# Patient Record
Sex: Female | Born: 1945 | Race: White | Hispanic: No | Smoking: Former smoker
Health system: Southern US, Community
[De-identification: ages and names within clinical notes are randomized; demographics above are authoritative.]

## PROBLEM LIST (undated history)

## (undated) DIAGNOSIS — Z8679 Personal history of other diseases of the circulatory system: Secondary | ICD-10-CM

## (undated) DIAGNOSIS — Z923 Personal history of irradiation: Secondary | ICD-10-CM

## (undated) DIAGNOSIS — C50919 Malignant neoplasm of unspecified site of unspecified female breast: Secondary | ICD-10-CM

## (undated) DIAGNOSIS — O2230 Deep phlebothrombosis in pregnancy, unspecified trimester: Secondary | ICD-10-CM

## (undated) DIAGNOSIS — K219 Gastro-esophageal reflux disease without esophagitis: Secondary | ICD-10-CM

## (undated) DIAGNOSIS — E785 Hyperlipidemia, unspecified: Secondary | ICD-10-CM

## (undated) DIAGNOSIS — E049 Nontoxic goiter, unspecified: Secondary | ICD-10-CM

## (undated) DIAGNOSIS — F209 Schizophrenia, unspecified: Secondary | ICD-10-CM

## (undated) DIAGNOSIS — N83209 Unspecified ovarian cyst, unspecified side: Secondary | ICD-10-CM

## (undated) DIAGNOSIS — I10 Essential (primary) hypertension: Secondary | ICD-10-CM

## (undated) DIAGNOSIS — D219 Benign neoplasm of connective and other soft tissue, unspecified: Secondary | ICD-10-CM

## (undated) DIAGNOSIS — D229 Melanocytic nevi, unspecified: Secondary | ICD-10-CM

## (undated) DIAGNOSIS — M1990 Unspecified osteoarthritis, unspecified site: Secondary | ICD-10-CM

## (undated) DIAGNOSIS — G5601 Carpal tunnel syndrome, right upper limb: Secondary | ICD-10-CM

## (undated) DIAGNOSIS — G629 Polyneuropathy, unspecified: Secondary | ICD-10-CM

## (undated) DIAGNOSIS — F419 Anxiety disorder, unspecified: Secondary | ICD-10-CM

## (undated) DIAGNOSIS — Z8489 Family history of other specified conditions: Secondary | ICD-10-CM

## (undated) DIAGNOSIS — J939 Pneumothorax, unspecified: Secondary | ICD-10-CM

## (undated) DIAGNOSIS — J189 Pneumonia, unspecified organism: Secondary | ICD-10-CM

## (undated) DIAGNOSIS — D649 Anemia, unspecified: Secondary | ICD-10-CM

## (undated) HISTORY — DX: Malignant neoplasm of unspecified site of unspecified female breast: C50.919

## (undated) HISTORY — PX: TONSILLECTOMY: SUR1361

## (undated) HISTORY — DX: Carpal tunnel syndrome, right upper limb: G56.01

## (undated) HISTORY — PX: APPENDECTOMY: SHX54

## (undated) HISTORY — PX: MENISCUS REPAIR: SHX5179

## (undated) HISTORY — DX: Unspecified osteoarthritis, unspecified site: M19.90

## (undated) HISTORY — DX: Deep phlebothrombosis in pregnancy, unspecified trimester: O22.30

## (undated) HISTORY — PX: TUBAL LIGATION: SHX77

## (undated) HISTORY — DX: Gastro-esophageal reflux disease without esophagitis: K21.9

## (undated) HISTORY — DX: Personal history of other diseases of the circulatory system: Z86.79

## (undated) HISTORY — DX: Unspecified ovarian cyst, unspecified side: N83.209

## (undated) HISTORY — DX: Nontoxic goiter, unspecified: E04.9

## (undated) HISTORY — PX: VAGINA SURGERY: SHX829

## (undated) HISTORY — DX: Schizophrenia, unspecified: F20.9

## (undated) HISTORY — PX: OTHER SURGICAL HISTORY: SHX169

## (undated) HISTORY — DX: Benign neoplasm of connective and other soft tissue, unspecified: D21.9

## (undated) HISTORY — DX: Melanocytic nevi, unspecified: D22.9

## (undated) HISTORY — DX: Essential (primary) hypertension: I10

## (undated) HISTORY — DX: Hyperlipidemia, unspecified: E78.5

---

## 1980-09-07 DIAGNOSIS — O2230 Deep phlebothrombosis in pregnancy, unspecified trimester: Secondary | ICD-10-CM

## 1980-09-07 HISTORY — DX: Deep phlebothrombosis in pregnancy, unspecified trimester: O22.30

## 1990-09-07 HISTORY — PX: ABDOMINAL HYSTERECTOMY: SHX81

## 2003-01-03 ENCOUNTER — Encounter: Admission: RE | Admit: 2003-01-03 | Discharge: 2003-01-03 | Payer: Self-pay | Admitting: Internal Medicine

## 2003-01-03 ENCOUNTER — Encounter: Payer: Self-pay | Admitting: Internal Medicine

## 2003-01-03 ENCOUNTER — Ambulatory Visit (HOSPITAL_COMMUNITY): Admission: RE | Admit: 2003-01-03 | Discharge: 2003-01-03 | Payer: Self-pay | Admitting: Internal Medicine

## 2003-01-11 ENCOUNTER — Ambulatory Visit (HOSPITAL_COMMUNITY): Admission: RE | Admit: 2003-01-11 | Discharge: 2003-01-11 | Payer: Self-pay | Admitting: Internal Medicine

## 2003-02-13 ENCOUNTER — Encounter: Payer: Self-pay | Admitting: Sports Medicine

## 2003-02-13 ENCOUNTER — Encounter: Admission: RE | Admit: 2003-02-13 | Discharge: 2003-02-13 | Payer: Self-pay | Admitting: Family Medicine

## 2003-10-25 ENCOUNTER — Encounter: Admission: RE | Admit: 2003-10-25 | Discharge: 2003-10-25 | Payer: Self-pay | Admitting: Internal Medicine

## 2003-12-27 ENCOUNTER — Ambulatory Visit (HOSPITAL_COMMUNITY): Admission: RE | Admit: 2003-12-27 | Discharge: 2003-12-27 | Payer: Self-pay | Admitting: Internal Medicine

## 2004-09-07 HISTORY — PX: EYE SURGERY: SHX253

## 2004-10-01 ENCOUNTER — Ambulatory Visit: Payer: Self-pay | Admitting: Podiatry

## 2005-09-07 HISTORY — PX: BREAST BIOPSY: SHX20

## 2005-09-07 HISTORY — PX: BREAST LUMPECTOMY: SHX2

## 2006-02-17 ENCOUNTER — Ambulatory Visit: Payer: Self-pay | Admitting: Internal Medicine

## 2006-06-17 ENCOUNTER — Encounter: Admission: RE | Admit: 2006-06-17 | Discharge: 2006-06-17 | Payer: Self-pay | Admitting: Sports Medicine

## 2006-06-24 ENCOUNTER — Encounter (INDEPENDENT_AMBULATORY_CARE_PROVIDER_SITE_OTHER): Payer: Self-pay | Admitting: *Deleted

## 2006-06-24 ENCOUNTER — Encounter (INDEPENDENT_AMBULATORY_CARE_PROVIDER_SITE_OTHER): Payer: Self-pay | Admitting: Pulmonary Disease

## 2006-06-24 ENCOUNTER — Ambulatory Visit: Payer: Self-pay | Admitting: Internal Medicine

## 2006-06-24 LAB — CONVERTED CEMR LAB: Candida species: NEGATIVE

## 2006-06-25 ENCOUNTER — Encounter (INDEPENDENT_AMBULATORY_CARE_PROVIDER_SITE_OTHER): Payer: Self-pay | Admitting: Pulmonary Disease

## 2006-06-25 LAB — CONVERTED CEMR LAB: Pap Smear: NORMAL

## 2006-06-25 LAB — HM PAP SMEAR

## 2006-06-28 ENCOUNTER — Encounter (INDEPENDENT_AMBULATORY_CARE_PROVIDER_SITE_OTHER): Payer: Self-pay | Admitting: Pulmonary Disease

## 2006-06-28 DIAGNOSIS — F209 Schizophrenia, unspecified: Secondary | ICD-10-CM | POA: Insufficient documentation

## 2006-06-28 DIAGNOSIS — E119 Type 2 diabetes mellitus without complications: Secondary | ICD-10-CM | POA: Insufficient documentation

## 2006-06-28 DIAGNOSIS — K219 Gastro-esophageal reflux disease without esophagitis: Secondary | ICD-10-CM | POA: Insufficient documentation

## 2006-06-28 DIAGNOSIS — E785 Hyperlipidemia, unspecified: Secondary | ICD-10-CM

## 2006-06-28 DIAGNOSIS — K029 Dental caries, unspecified: Secondary | ICD-10-CM | POA: Insufficient documentation

## 2006-06-28 DIAGNOSIS — I10 Essential (primary) hypertension: Secondary | ICD-10-CM

## 2006-06-28 DIAGNOSIS — E1169 Type 2 diabetes mellitus with other specified complication: Secondary | ICD-10-CM | POA: Insufficient documentation

## 2006-06-28 DIAGNOSIS — E1165 Type 2 diabetes mellitus with hyperglycemia: Secondary | ICD-10-CM

## 2006-07-14 ENCOUNTER — Encounter (INDEPENDENT_AMBULATORY_CARE_PROVIDER_SITE_OTHER): Payer: Self-pay | Admitting: Diagnostic Radiology

## 2006-07-14 ENCOUNTER — Encounter (INDEPENDENT_AMBULATORY_CARE_PROVIDER_SITE_OTHER): Payer: Self-pay | Admitting: Specialist

## 2006-07-14 ENCOUNTER — Encounter: Admission: RE | Admit: 2006-07-14 | Discharge: 2006-07-14 | Payer: Self-pay | Admitting: Sports Medicine

## 2006-07-14 DIAGNOSIS — Z853 Personal history of malignant neoplasm of breast: Secondary | ICD-10-CM | POA: Insufficient documentation

## 2006-07-14 DIAGNOSIS — C50919 Malignant neoplasm of unspecified site of unspecified female breast: Secondary | ICD-10-CM

## 2006-07-14 HISTORY — DX: Malignant neoplasm of unspecified site of unspecified female breast: C50.919

## 2006-07-14 HISTORY — DX: Personal history of malignant neoplasm of breast: Z85.3

## 2006-08-03 ENCOUNTER — Encounter: Admission: RE | Admit: 2006-08-03 | Discharge: 2006-08-03 | Payer: Self-pay | Admitting: General Surgery

## 2006-08-09 ENCOUNTER — Encounter: Admission: RE | Admit: 2006-08-09 | Discharge: 2006-08-09 | Payer: Self-pay | Admitting: General Surgery

## 2006-08-12 ENCOUNTER — Ambulatory Visit (HOSPITAL_BASED_OUTPATIENT_CLINIC_OR_DEPARTMENT_OTHER): Admission: RE | Admit: 2006-08-12 | Discharge: 2006-08-12 | Payer: Self-pay | Admitting: General Surgery

## 2006-08-12 ENCOUNTER — Encounter (INDEPENDENT_AMBULATORY_CARE_PROVIDER_SITE_OTHER): Payer: Self-pay | Admitting: *Deleted

## 2006-08-12 ENCOUNTER — Encounter: Admission: RE | Admit: 2006-08-12 | Discharge: 2006-08-12 | Payer: Self-pay | Admitting: General Surgery

## 2006-08-30 ENCOUNTER — Ambulatory Visit: Payer: Self-pay | Admitting: Oncology

## 2006-09-15 LAB — CBC WITH DIFFERENTIAL/PLATELET
BASO%: 0.8 % (ref 0.0–2.0)
Eosinophils Absolute: 0.1 10*3/uL (ref 0.0–0.5)
LYMPH%: 36.5 % (ref 14.0–48.0)
MONO#: 0.2 10*3/uL (ref 0.1–0.9)
NEUT#: 2 10*3/uL (ref 1.5–6.5)
Platelets: 370 10*3/uL (ref 145–400)
RBC: 4.75 10*6/uL (ref 3.70–5.32)
RDW: 12.6 % (ref 11.3–14.5)
WBC: 3.7 10*3/uL — ABNORMAL LOW (ref 3.9–10.0)
lymph#: 1.3 10*3/uL (ref 0.9–3.3)

## 2006-09-15 LAB — COMPREHENSIVE METABOLIC PANEL
ALT: 13 U/L (ref 0–35)
Albumin: 4.5 g/dL (ref 3.5–5.2)
CO2: 24 mEq/L (ref 19–32)
Calcium: 9.4 mg/dL (ref 8.4–10.5)
Chloride: 103 mEq/L (ref 96–112)
Glucose, Bld: 198 mg/dL — ABNORMAL HIGH (ref 70–99)
Potassium: 3.8 mEq/L (ref 3.5–5.3)
Sodium: 140 mEq/L (ref 135–145)
Total Protein: 7.6 g/dL (ref 6.0–8.3)

## 2006-09-15 LAB — LACTATE DEHYDROGENASE: LDH: 148 U/L (ref 94–250)

## 2006-09-15 LAB — CANCER ANTIGEN 27.29: CA 27.29: 13 U/mL (ref 0–39)

## 2006-09-21 ENCOUNTER — Ambulatory Visit: Admission: RE | Admit: 2006-09-21 | Discharge: 2006-12-08 | Payer: Self-pay | Admitting: Radiation Oncology

## 2006-09-24 ENCOUNTER — Encounter: Admission: RE | Admit: 2006-09-24 | Discharge: 2006-09-24 | Payer: Self-pay | Admitting: Oncology

## 2006-09-28 ENCOUNTER — Ambulatory Visit (HOSPITAL_COMMUNITY): Admission: RE | Admit: 2006-09-28 | Discharge: 2006-09-28 | Payer: Self-pay | Admitting: Oncology

## 2006-09-28 DIAGNOSIS — Z8679 Personal history of other diseases of the circulatory system: Secondary | ICD-10-CM | POA: Insufficient documentation

## 2006-09-28 DIAGNOSIS — Z8672 Personal history of thrombophlebitis: Secondary | ICD-10-CM

## 2006-10-01 ENCOUNTER — Encounter (HOSPITAL_COMMUNITY): Admission: RE | Admit: 2006-10-01 | Discharge: 2006-12-30 | Payer: Self-pay | Admitting: Oncology

## 2006-11-29 ENCOUNTER — Ambulatory Visit: Payer: Self-pay | Admitting: Oncology

## 2006-11-30 ENCOUNTER — Ambulatory Visit (HOSPITAL_COMMUNITY): Admission: RE | Admit: 2006-11-30 | Discharge: 2006-11-30 | Payer: Self-pay | Admitting: Oncology

## 2006-12-01 LAB — CBC WITH DIFFERENTIAL/PLATELET
BASO%: 1.9 % (ref 0.0–2.0)
Basophils Absolute: 0.1 10*3/uL (ref 0.0–0.1)
EOS%: 2.1 % (ref 0.0–7.0)
Eosinophils Absolute: 0.1 10*3/uL (ref 0.0–0.5)
HCT: 39.6 % (ref 34.8–46.6)
HGB: 13.3 g/dL (ref 11.6–15.9)
LYMPH%: 28.5 % (ref 14.0–48.0)
MCH: 28.8 pg (ref 26.0–34.0)
MCHC: 33.7 g/dL (ref 32.0–36.0)
MCV: 85.7 fL (ref 81.0–101.0)
MONO#: 0.5 10*3/uL (ref 0.1–0.9)
MONO%: 12.3 % (ref 0.0–13.0)
NEUT#: 2.2 10*3/uL (ref 1.5–6.5)
NEUT%: 55.3 % (ref 39.6–76.8)
Platelets: 305 10*3/uL (ref 145–400)
RBC: 4.62 10*6/uL (ref 3.70–5.32)
RDW: 11 % — ABNORMAL LOW (ref 11.3–14.5)
WBC: 4 10*3/uL (ref 3.9–10.0)
lymph#: 1.1 10*3/uL (ref 0.9–3.3)

## 2006-12-01 LAB — COMPREHENSIVE METABOLIC PANEL
ALT: 43 U/L — ABNORMAL HIGH (ref 0–35)
AST: 27 U/L (ref 0–37)
Albumin: 4.3 g/dL (ref 3.5–5.2)
BUN: 9 mg/dL (ref 6–23)
CO2: 25 mEq/L (ref 19–32)
Calcium: 9.3 mg/dL (ref 8.4–10.5)
Chloride: 105 mEq/L (ref 96–112)
Potassium: 3.7 mEq/L (ref 3.5–5.3)

## 2006-12-16 ENCOUNTER — Ambulatory Visit: Payer: Self-pay | Admitting: Internal Medicine

## 2006-12-16 ENCOUNTER — Encounter (INDEPENDENT_AMBULATORY_CARE_PROVIDER_SITE_OTHER): Payer: Self-pay | Admitting: Pulmonary Disease

## 2006-12-16 DIAGNOSIS — N83209 Unspecified ovarian cyst, unspecified side: Secondary | ICD-10-CM

## 2006-12-16 DIAGNOSIS — D259 Leiomyoma of uterus, unspecified: Secondary | ICD-10-CM

## 2006-12-16 LAB — CONVERTED CEMR LAB
BUN: 10 mg/dL (ref 6–23)
Blood Glucose, Fingerstick: 183
Cholesterol: 164 mg/dL (ref 0–200)
Creatinine, Ser: 0.68 mg/dL (ref 0.40–1.20)
Creatinine, Urine: 203 mg/dL
HDL: 33 mg/dL — ABNORMAL LOW (ref >39)
Hemoglobin: 13.6 g/dL (ref 12.0–15.0)
Hgb A1c MFr Bld: 7.2 %
MCHC: 31.6 g/dL (ref 30.0–36.0)
Microalb Creat Ratio: 12.3 mg/g (ref 0.0–30.0)
Platelets: 337 10*3/uL (ref 150–400)
Potassium: 4 meq/L (ref 3.5–5.3)
RDW: 12.8 % (ref 11.5–14.0)
Triglycerides: 173 mg/dL — ABNORMAL HIGH (ref <150)
VLDL: 35 mg/dL (ref 0–40)

## 2007-01-24 ENCOUNTER — Telehealth (INDEPENDENT_AMBULATORY_CARE_PROVIDER_SITE_OTHER): Payer: Self-pay | Admitting: Pulmonary Disease

## 2007-01-26 ENCOUNTER — Ambulatory Visit: Payer: Self-pay | Admitting: Oncology

## 2007-01-28 LAB — COMPREHENSIVE METABOLIC PANEL
AST: 15 U/L (ref 0–37)
Albumin: 4.5 g/dL (ref 3.5–5.2)
Alkaline Phosphatase: 120 U/L — ABNORMAL HIGH (ref 39–117)
Potassium: 4.2 mEq/L (ref 3.5–5.3)
Sodium: 136 mEq/L (ref 135–145)
Total Protein: 8.3 g/dL (ref 6.0–8.3)

## 2007-01-28 LAB — CBC WITH DIFFERENTIAL/PLATELET
BASO%: 0.3 % (ref 0.0–2.0)
EOS%: 2 % (ref 0.0–7.0)
MCH: 28.8 pg (ref 26.0–34.0)
MCV: 83.8 fL (ref 81.0–101.0)
MONO%: 9.9 % (ref 0.0–13.0)
NEUT#: 1.5 10*3/uL (ref 1.5–6.5)
RBC: 4.87 10*6/uL (ref 3.70–5.32)
RDW: 12.6 % (ref 11.3–14.5)

## 2007-02-04 ENCOUNTER — Ambulatory Visit: Payer: Self-pay | Admitting: Internal Medicine

## 2007-02-04 ENCOUNTER — Encounter (INDEPENDENT_AMBULATORY_CARE_PROVIDER_SITE_OTHER): Payer: Self-pay | Admitting: Pulmonary Disease

## 2007-02-07 ENCOUNTER — Telehealth: Payer: Self-pay | Admitting: *Deleted

## 2007-02-18 ENCOUNTER — Encounter: Payer: Self-pay | Admitting: Internal Medicine

## 2007-02-18 ENCOUNTER — Ambulatory Visit: Payer: Self-pay | Admitting: Internal Medicine

## 2007-02-18 ENCOUNTER — Inpatient Hospital Stay (HOSPITAL_COMMUNITY): Admission: AD | Admit: 2007-02-18 | Discharge: 2007-02-19 | Payer: Self-pay | Admitting: Internal Medicine

## 2007-02-18 ENCOUNTER — Encounter: Admission: RE | Admit: 2007-02-18 | Discharge: 2007-02-18 | Payer: Self-pay | Admitting: Internal Medicine

## 2007-02-18 ENCOUNTER — Encounter (INDEPENDENT_AMBULATORY_CARE_PROVIDER_SITE_OTHER): Payer: Self-pay | Admitting: Internal Medicine

## 2007-02-18 ENCOUNTER — Encounter (INDEPENDENT_AMBULATORY_CARE_PROVIDER_SITE_OTHER): Payer: Self-pay | Admitting: Pulmonary Disease

## 2007-02-18 LAB — CONVERTED CEMR LAB: Blood Glucose, Fingerstick: 329

## 2007-02-19 ENCOUNTER — Encounter (INDEPENDENT_AMBULATORY_CARE_PROVIDER_SITE_OTHER): Payer: Self-pay | Admitting: Internal Medicine

## 2007-04-05 ENCOUNTER — Encounter (INDEPENDENT_AMBULATORY_CARE_PROVIDER_SITE_OTHER): Payer: Self-pay | Admitting: Internal Medicine

## 2007-04-05 ENCOUNTER — Ambulatory Visit: Payer: Self-pay

## 2007-04-05 ENCOUNTER — Encounter: Payer: Self-pay | Admitting: Internal Medicine

## 2007-04-05 LAB — CONVERTED CEMR LAB
ALT: 36 units/L — ABNORMAL HIGH (ref 0–35)
AST: 22 units/L (ref 0–37)
Albumin: 3.7 g/dL (ref 3.5–5.2)
Alkaline Phosphatase: 96 units/L (ref 39–117)
Total Bilirubin: 0.9 mg/dL (ref 0.3–1.2)
Total CHOL/HDL Ratio: 6.4

## 2007-05-12 ENCOUNTER — Ambulatory Visit: Payer: Self-pay | Admitting: Internal Medicine

## 2007-05-18 ENCOUNTER — Ambulatory Visit: Payer: Self-pay | Admitting: Oncology

## 2007-05-20 LAB — COMPREHENSIVE METABOLIC PANEL
ALT: 16 U/L (ref 0–35)
AST: 15 U/L (ref 0–37)
Alkaline Phosphatase: 105 U/L (ref 39–117)
CO2: 25 mEq/L (ref 19–32)
Sodium: 140 mEq/L (ref 135–145)
Total Bilirubin: 0.8 mg/dL (ref 0.3–1.2)
Total Protein: 7.8 g/dL (ref 6.0–8.3)

## 2007-05-20 LAB — CBC WITH DIFFERENTIAL/PLATELET
BASO%: 0.7 % (ref 0.0–2.0)
LYMPH%: 34.2 % (ref 14.0–48.0)
MCHC: 33.6 g/dL (ref 32.0–36.0)
MONO#: 0.2 10*3/uL (ref 0.1–0.9)
MONO%: 6.4 % (ref 0.0–13.0)
Platelets: 311 10*3/uL (ref 145–400)
RBC: 4.65 10*6/uL (ref 3.70–5.32)
WBC: 3.7 10*3/uL — ABNORMAL LOW (ref 3.9–10.0)

## 2007-05-28 ENCOUNTER — Encounter (INDEPENDENT_AMBULATORY_CARE_PROVIDER_SITE_OTHER): Payer: Self-pay | Admitting: Internal Medicine

## 2007-07-06 ENCOUNTER — Encounter: Admission: RE | Admit: 2007-07-06 | Discharge: 2007-07-06 | Payer: Self-pay | Admitting: Oncology

## 2007-08-29 ENCOUNTER — Telehealth (INDEPENDENT_AMBULATORY_CARE_PROVIDER_SITE_OTHER): Payer: Self-pay | Admitting: Internal Medicine

## 2007-09-12 ENCOUNTER — Telehealth: Payer: Self-pay | Admitting: *Deleted

## 2007-09-28 ENCOUNTER — Encounter (INDEPENDENT_AMBULATORY_CARE_PROVIDER_SITE_OTHER): Payer: Self-pay | Admitting: Internal Medicine

## 2007-09-28 ENCOUNTER — Ambulatory Visit: Payer: Self-pay | Admitting: Internal Medicine

## 2007-09-28 LAB — CONVERTED CEMR LAB: Blood Glucose, Fingerstick: 228

## 2007-09-30 LAB — CONVERTED CEMR LAB
AST: 13 units/L (ref 0–37)
BUN: 9 mg/dL (ref 6–23)
Calcium: 9 mg/dL (ref 8.4–10.5)
Chloride: 104 meq/L (ref 96–112)
Cholesterol: 100 mg/dL (ref 0–200)
Creatinine, Ser: 0.63 mg/dL (ref 0.40–1.20)
HCT: 41.9 % (ref 36.0–46.0)
HDL: 23 mg/dL — ABNORMAL LOW (ref >39)
Hemoglobin: 13.5 g/dL (ref 12.0–15.0)
RDW: 12.9 % (ref 11.5–15.5)
Total CHOL/HDL Ratio: 4.3

## 2007-10-13 ENCOUNTER — Ambulatory Visit: Payer: Self-pay | Admitting: Oncology

## 2007-10-17 LAB — COMPREHENSIVE METABOLIC PANEL
AST: 13 U/L (ref 0–37)
Alkaline Phosphatase: 111 U/L (ref 39–117)
BUN: 7 mg/dL (ref 6–23)
Creatinine, Ser: 0.65 mg/dL (ref 0.40–1.20)
Potassium: 3.9 mEq/L (ref 3.5–5.3)
Total Bilirubin: 0.9 mg/dL (ref 0.3–1.2)

## 2007-10-17 LAB — LACTATE DEHYDROGENASE: LDH: 129 U/L (ref 94–250)

## 2007-10-17 LAB — CBC WITH DIFFERENTIAL/PLATELET
Basophils Absolute: 0 10*3/uL (ref 0.0–0.1)
EOS%: 1.6 % (ref 0.0–7.0)
HGB: 13 g/dL (ref 11.6–15.9)
LYMPH%: 40.8 % (ref 14.0–48.0)
MCH: 27.3 pg (ref 26.0–34.0)
MCV: 84.8 fL (ref 81.0–101.0)
MONO%: 7.9 % (ref 0.0–13.0)
RDW: 12.4 % (ref 11.3–14.5)

## 2007-12-01 ENCOUNTER — Ambulatory Visit: Payer: Self-pay | Admitting: Oncology

## 2008-04-19 ENCOUNTER — Telehealth (INDEPENDENT_AMBULATORY_CARE_PROVIDER_SITE_OTHER): Payer: Self-pay | Admitting: Internal Medicine

## 2008-05-30 ENCOUNTER — Ambulatory Visit: Payer: Self-pay | Admitting: Oncology

## 2008-06-01 ENCOUNTER — Telehealth (INDEPENDENT_AMBULATORY_CARE_PROVIDER_SITE_OTHER): Payer: Self-pay | Admitting: *Deleted

## 2008-06-01 LAB — CBC WITH DIFFERENTIAL/PLATELET
Basophils Absolute: 0 10*3/uL (ref 0.0–0.1)
EOS%: 1.7 % (ref 0.0–7.0)
HCT: 41.4 % (ref 34.8–46.6)
HGB: 14 g/dL (ref 11.6–15.9)
MCH: 29 pg (ref 26.0–34.0)
MCV: 85.9 fL (ref 81.0–101.0)
MONO%: 6 % (ref 0.0–13.0)
NEUT%: 39.9 % (ref 39.6–76.8)
lymph#: 2.4 10*3/uL (ref 0.9–3.3)

## 2008-06-04 LAB — COMPREHENSIVE METABOLIC PANEL
Albumin: 4.5 g/dL (ref 3.5–5.2)
Alkaline Phosphatase: 116 U/L (ref 39–117)
CO2: 22 mEq/L (ref 19–32)
Glucose, Bld: 227 mg/dL — ABNORMAL HIGH (ref 70–99)
Potassium: 3.9 mEq/L (ref 3.5–5.3)
Sodium: 137 mEq/L (ref 135–145)
Total Protein: 7.6 g/dL (ref 6.0–8.3)

## 2008-06-04 LAB — CANCER ANTIGEN 27.29: CA 27.29: 19 U/mL (ref 0–39)

## 2008-06-04 LAB — LACTATE DEHYDROGENASE: LDH: 133 U/L (ref 94–250)

## 2008-07-09 ENCOUNTER — Encounter (INDEPENDENT_AMBULATORY_CARE_PROVIDER_SITE_OTHER): Payer: Self-pay | Admitting: Internal Medicine

## 2008-07-09 LAB — CBC WITH DIFFERENTIAL/PLATELET
BASO%: 0.8 % (ref 0.0–2.0)
HCT: 40.9 % (ref 34.8–46.6)
HGB: 13.4 g/dL (ref 11.6–15.9)
MCHC: 32.8 g/dL (ref 32.0–36.0)
MONO#: 0.3 10*3/uL (ref 0.1–0.9)
NEUT#: 1.9 10*3/uL (ref 1.5–6.5)
NEUT%: 45.2 % (ref 39.6–76.8)
lymph#: 1.9 10*3/uL (ref 0.9–3.3)

## 2008-07-10 LAB — CANCER ANTIGEN 27.29: CA 27.29: 19 U/mL (ref 0–39)

## 2008-07-10 LAB — LIPID PANEL
Cholesterol: 150 mg/dL (ref 0–200)
Triglycerides: 231 mg/dL — ABNORMAL HIGH (ref <150)

## 2008-07-10 LAB — COMPREHENSIVE METABOLIC PANEL
BUN: 9 mg/dL (ref 6–23)
CO2: 25 mEq/L (ref 19–32)
Calcium: 9.7 mg/dL (ref 8.4–10.5)
Chloride: 103 mEq/L (ref 96–112)
Creatinine, Ser: 0.68 mg/dL (ref 0.40–1.20)

## 2008-08-17 ENCOUNTER — Encounter: Admission: RE | Admit: 2008-08-17 | Discharge: 2008-08-17 | Payer: Self-pay | Admitting: Oncology

## 2008-08-21 ENCOUNTER — Encounter (INDEPENDENT_AMBULATORY_CARE_PROVIDER_SITE_OTHER): Payer: Self-pay | Admitting: Internal Medicine

## 2008-08-21 ENCOUNTER — Ambulatory Visit: Payer: Self-pay | Admitting: Internal Medicine

## 2008-08-21 LAB — CONVERTED CEMR LAB: Hgb A1c MFr Bld: 8.9 %

## 2008-08-22 LAB — CONVERTED CEMR LAB
HDL: 29 mg/dL — ABNORMAL LOW (ref >39)
LDL Cholesterol: 40 mg/dL (ref 0–99)
Total CHOL/HDL Ratio: 5
Triglycerides: 373 mg/dL — ABNORMAL HIGH (ref <150)
VLDL: 75 mg/dL — ABNORMAL HIGH (ref 0–40)

## 2008-08-24 ENCOUNTER — Encounter (INDEPENDENT_AMBULATORY_CARE_PROVIDER_SITE_OTHER): Payer: Self-pay | Admitting: *Deleted

## 2008-10-05 ENCOUNTER — Encounter: Admission: RE | Admit: 2008-10-05 | Discharge: 2008-10-05 | Payer: Self-pay | Admitting: Oncology

## 2008-11-27 ENCOUNTER — Ambulatory Visit: Payer: Self-pay | Admitting: *Deleted

## 2008-11-27 ENCOUNTER — Encounter (INDEPENDENT_AMBULATORY_CARE_PROVIDER_SITE_OTHER): Payer: Self-pay | Admitting: Internal Medicine

## 2008-11-27 DIAGNOSIS — E049 Nontoxic goiter, unspecified: Secondary | ICD-10-CM | POA: Insufficient documentation

## 2008-11-28 LAB — CONVERTED CEMR LAB
ALT: 14 units/L (ref 0–35)
Alkaline Phosphatase: 115 units/L (ref 39–117)
MCHC: 31.6 g/dL (ref 30.0–36.0)
MCV: 84.1 fL (ref 78.0–100.0)
Platelets: 362 10*3/uL (ref 150–400)
RDW: 12.6 % (ref 11.5–15.5)
Sodium: 136 meq/L (ref 135–145)
Total Bilirubin: 0.8 mg/dL (ref 0.3–1.2)
Total Protein: 7.5 g/dL (ref 6.0–8.3)

## 2008-12-26 ENCOUNTER — Encounter (INDEPENDENT_AMBULATORY_CARE_PROVIDER_SITE_OTHER): Payer: Self-pay | Admitting: Internal Medicine

## 2009-01-04 ENCOUNTER — Ambulatory Visit: Payer: Self-pay | Admitting: Oncology

## 2009-02-12 ENCOUNTER — Ambulatory Visit: Payer: Self-pay | Admitting: Internal Medicine

## 2009-02-12 ENCOUNTER — Encounter (INDEPENDENT_AMBULATORY_CARE_PROVIDER_SITE_OTHER): Payer: Self-pay | Admitting: Internal Medicine

## 2009-02-12 LAB — CONVERTED CEMR LAB: Blood Glucose, Fingerstick: 291

## 2009-02-13 ENCOUNTER — Encounter (INDEPENDENT_AMBULATORY_CARE_PROVIDER_SITE_OTHER): Payer: Self-pay | Admitting: Internal Medicine

## 2009-02-14 ENCOUNTER — Encounter (INDEPENDENT_AMBULATORY_CARE_PROVIDER_SITE_OTHER): Payer: Self-pay | Admitting: Internal Medicine

## 2009-02-14 ENCOUNTER — Ambulatory Visit: Payer: Self-pay | Admitting: Oncology

## 2009-02-14 LAB — CBC WITH DIFFERENTIAL/PLATELET
BASO%: 0.4 % (ref 0.0–2.0)
Eosinophils Absolute: 0.1 10*3/uL (ref 0.0–0.5)
MCHC: 33.9 g/dL (ref 31.5–36.0)
MONO#: 0.3 10*3/uL (ref 0.1–0.9)
NEUT#: 2.1 10*3/uL (ref 1.5–6.5)
RBC: 4.87 10*6/uL (ref 3.70–5.45)
RDW: 13.3 % (ref 11.2–14.5)
WBC: 4.1 10*3/uL (ref 3.9–10.3)
lymph#: 1.6 10*3/uL (ref 0.9–3.3)

## 2009-02-15 LAB — COMPREHENSIVE METABOLIC PANEL
ALT: 12 U/L (ref 0–35)
AST: 12 U/L (ref 0–37)
CO2: 22 mEq/L (ref 19–32)
Calcium: 9.9 mg/dL (ref 8.4–10.5)
Chloride: 103 mEq/L (ref 96–112)
Sodium: 136 mEq/L (ref 135–145)
Total Protein: 7.4 g/dL (ref 6.0–8.3)

## 2009-02-15 LAB — LACTATE DEHYDROGENASE: LDH: 133 U/L (ref 94–250)

## 2009-02-15 LAB — CANCER ANTIGEN 27.29: CA 27.29: 25 U/mL (ref 0–39)

## 2009-02-19 ENCOUNTER — Ambulatory Visit (HOSPITAL_COMMUNITY): Admission: RE | Admit: 2009-02-19 | Discharge: 2009-02-19 | Payer: Self-pay | Admitting: Internal Medicine

## 2009-05-06 ENCOUNTER — Telehealth (INDEPENDENT_AMBULATORY_CARE_PROVIDER_SITE_OTHER): Payer: Self-pay | Admitting: Internal Medicine

## 2009-06-17 ENCOUNTER — Ambulatory Visit: Payer: Self-pay | Admitting: Internal Medicine

## 2009-06-17 LAB — CONVERTED CEMR LAB
Blood Glucose, Home Monitor: 1 mg/dL
Hgb A1c MFr Bld: 12.2 %

## 2009-08-07 ENCOUNTER — Encounter (INDEPENDENT_AMBULATORY_CARE_PROVIDER_SITE_OTHER): Payer: Self-pay | Admitting: Internal Medicine

## 2009-08-07 ENCOUNTER — Ambulatory Visit: Payer: Self-pay | Admitting: Internal Medicine

## 2009-08-12 ENCOUNTER — Ambulatory Visit: Payer: Self-pay | Admitting: Oncology

## 2009-08-27 ENCOUNTER — Ambulatory Visit (HOSPITAL_COMMUNITY): Admission: RE | Admit: 2009-08-27 | Discharge: 2009-08-27 | Payer: Self-pay | Admitting: Internal Medicine

## 2009-09-11 ENCOUNTER — Ambulatory Visit: Payer: Self-pay | Admitting: Internal Medicine

## 2009-09-11 LAB — CONVERTED CEMR LAB
Blood Glucose, AC Bkfst: 194 mg/dL
Hgb A1c MFr Bld: 11.3 %

## 2009-09-12 LAB — CONVERTED CEMR LAB
CO2: 23 meq/L (ref 19–32)
Calcium: 9.4 mg/dL (ref 8.4–10.5)
Chloride: 101 meq/L (ref 96–112)
Cholesterol: 143 mg/dL (ref 0–200)
Glucose, Bld: 195 mg/dL — ABNORMAL HIGH (ref 70–99)
Sodium: 139 meq/L (ref 135–145)
Total Bilirubin: 0.7 mg/dL (ref 0.3–1.2)
Total Protein: 7.3 g/dL (ref 6.0–8.3)
Triglycerides: 204 mg/dL — ABNORMAL HIGH (ref <150)
VLDL: 41 mg/dL — ABNORMAL HIGH (ref 0–40)

## 2009-09-13 ENCOUNTER — Encounter (INDEPENDENT_AMBULATORY_CARE_PROVIDER_SITE_OTHER): Payer: Self-pay | Admitting: Internal Medicine

## 2009-09-17 ENCOUNTER — Ambulatory Visit: Payer: Self-pay | Admitting: Oncology

## 2009-09-19 ENCOUNTER — Encounter (INDEPENDENT_AMBULATORY_CARE_PROVIDER_SITE_OTHER): Payer: Self-pay | Admitting: Internal Medicine

## 2009-09-19 LAB — COMPREHENSIVE METABOLIC PANEL
ALT: 15 U/L (ref 0–35)
Alkaline Phosphatase: 102 U/L (ref 39–117)
Creatinine, Ser: 0.52 mg/dL (ref 0.40–1.20)
Sodium: 137 mEq/L (ref 135–145)
Total Bilirubin: 1 mg/dL (ref 0.3–1.2)
Total Protein: 8.1 g/dL (ref 6.0–8.3)

## 2009-09-19 LAB — CBC WITH DIFFERENTIAL/PLATELET
BASO%: 0.6 % (ref 0.0–2.0)
EOS%: 1.5 % (ref 0.0–7.0)
HCT: 41.5 % (ref 34.8–46.6)
LYMPH%: 35.9 % (ref 14.0–49.7)
MCH: 28.5 pg (ref 25.1–34.0)
MCHC: 33.2 g/dL (ref 31.5–36.0)
MCV: 85.7 fL (ref 79.5–101.0)
MONO%: 7.4 % (ref 0.0–14.0)
NEUT%: 54.6 % (ref 38.4–76.8)
Platelets: 330 10*3/uL (ref 145–400)
RBC: 4.85 10*6/uL (ref 3.70–5.45)
WBC: 4 10*3/uL (ref 3.9–10.3)

## 2009-09-19 LAB — LACTATE DEHYDROGENASE: LDH: 136 U/L (ref 94–250)

## 2009-10-07 ENCOUNTER — Encounter: Admission: RE | Admit: 2009-10-07 | Discharge: 2009-10-07 | Payer: Self-pay | Admitting: Internal Medicine

## 2009-10-22 ENCOUNTER — Ambulatory Visit: Payer: Self-pay | Admitting: Internal Medicine

## 2009-12-09 ENCOUNTER — Telehealth (INDEPENDENT_AMBULATORY_CARE_PROVIDER_SITE_OTHER): Payer: Self-pay | Admitting: Internal Medicine

## 2009-12-10 ENCOUNTER — Telehealth (INDEPENDENT_AMBULATORY_CARE_PROVIDER_SITE_OTHER): Payer: Self-pay | Admitting: Internal Medicine

## 2010-02-19 ENCOUNTER — Emergency Department (HOSPITAL_COMMUNITY): Admission: EM | Admit: 2010-02-19 | Discharge: 2010-02-19 | Payer: Self-pay | Admitting: Emergency Medicine

## 2010-03-17 ENCOUNTER — Telehealth: Payer: Self-pay | Admitting: Internal Medicine

## 2010-03-17 ENCOUNTER — Emergency Department (HOSPITAL_COMMUNITY): Admission: EM | Admit: 2010-03-17 | Discharge: 2010-03-17 | Payer: Self-pay | Admitting: Emergency Medicine

## 2010-03-25 ENCOUNTER — Ambulatory Visit: Payer: Self-pay | Admitting: Oncology

## 2010-03-27 LAB — CBC WITH DIFFERENTIAL/PLATELET
Basophils Absolute: 0 10*3/uL (ref 0.0–0.1)
Eosinophils Absolute: 0 10*3/uL (ref 0.0–0.5)
HCT: 40.4 % (ref 34.8–46.6)
HGB: 13.5 g/dL (ref 11.6–15.9)
LYMPH%: 20 % (ref 14.0–49.7)
MCV: 86 fL (ref 79.5–101.0)
MONO#: 0.1 10*3/uL (ref 0.1–0.9)
MONO%: 1.3 % (ref 0.0–14.0)
NEUT#: 7.6 10*3/uL — ABNORMAL HIGH (ref 1.5–6.5)
Platelets: 372 10*3/uL (ref 145–400)
RBC: 4.7 10*6/uL (ref 3.70–5.45)
WBC: 9.7 10*3/uL (ref 3.9–10.3)

## 2010-03-27 LAB — COMPREHENSIVE METABOLIC PANEL
Albumin: 4.3 g/dL (ref 3.5–5.2)
Alkaline Phosphatase: 92 U/L (ref 39–117)
BUN: 13 mg/dL (ref 6–23)
CO2: 24 mEq/L (ref 19–32)
Glucose, Bld: 191 mg/dL — ABNORMAL HIGH (ref 70–99)
Sodium: 137 mEq/L (ref 135–145)
Total Bilirubin: 0.9 mg/dL (ref 0.3–1.2)
Total Protein: 7.4 g/dL (ref 6.0–8.3)

## 2010-03-27 LAB — LACTATE DEHYDROGENASE: LDH: 124 U/L (ref 94–250)

## 2010-03-27 LAB — CANCER ANTIGEN 27.29: CA 27.29: 18 U/mL (ref 0–39)

## 2010-03-27 LAB — VITAMIN D 25 HYDROXY (VIT D DEFICIENCY, FRACTURES): Vit D, 25-Hydroxy: 18 ng/mL — ABNORMAL LOW (ref 30–89)

## 2010-04-08 ENCOUNTER — Ambulatory Visit: Payer: Self-pay | Admitting: Internal Medicine

## 2010-04-08 LAB — CONVERTED CEMR LAB
Blood Glucose, Fingerstick: 198
Hgb A1c MFr Bld: 10.3 %

## 2010-04-09 ENCOUNTER — Encounter: Payer: Self-pay | Admitting: Internal Medicine

## 2010-04-09 LAB — CONVERTED CEMR LAB
Creatinine, Urine: 171.5 mg/dL
Microalb Creat Ratio: 9.2 mg/g (ref 0.0–30.0)
Microalb, Ur: 1.58 mg/dL (ref 0.00–1.89)

## 2010-04-14 ENCOUNTER — Telehealth: Payer: Self-pay | Admitting: Internal Medicine

## 2010-04-25 ENCOUNTER — Ambulatory Visit: Payer: Self-pay | Admitting: Oncology

## 2010-05-01 ENCOUNTER — Telehealth: Payer: Self-pay | Admitting: Internal Medicine

## 2010-05-27 ENCOUNTER — Ambulatory Visit: Payer: Self-pay | Admitting: Oncology

## 2010-05-28 ENCOUNTER — Ambulatory Visit: Payer: Self-pay | Admitting: Internal Medicine

## 2010-05-29 ENCOUNTER — Encounter: Payer: Self-pay | Admitting: Internal Medicine

## 2010-06-16 ENCOUNTER — Ambulatory Visit: Payer: Self-pay | Admitting: Internal Medicine

## 2010-08-12 ENCOUNTER — Encounter: Payer: Self-pay | Admitting: Internal Medicine

## 2010-08-12 ENCOUNTER — Ambulatory Visit: Payer: Self-pay | Admitting: Internal Medicine

## 2010-08-12 DIAGNOSIS — D239 Other benign neoplasm of skin, unspecified: Secondary | ICD-10-CM | POA: Insufficient documentation

## 2010-08-12 LAB — HM DIABETES EYE EXAM: HM Diabetic Eye Exam: NORMAL

## 2010-09-27 ENCOUNTER — Other Ambulatory Visit: Payer: Self-pay | Admitting: Oncology

## 2010-09-27 DIAGNOSIS — Z853 Personal history of malignant neoplasm of breast: Secondary | ICD-10-CM

## 2010-09-27 DIAGNOSIS — Z0000 Encounter for general adult medical examination without abnormal findings: Secondary | ICD-10-CM

## 2010-09-27 DIAGNOSIS — Z780 Asymptomatic menopausal state: Secondary | ICD-10-CM

## 2010-09-28 ENCOUNTER — Encounter: Payer: Self-pay | Admitting: Oncology

## 2010-09-28 ENCOUNTER — Encounter: Payer: Self-pay | Admitting: Internal Medicine

## 2010-10-05 LAB — CONVERTED CEMR LAB
BUN: 11 mg/dL (ref 6–23)
Basophils Relative: 1 % (ref 0–1)
Calcium: 9.8 mg/dL (ref 8.4–10.5)
Creatinine, Ser: 0.75 mg/dL (ref 0.40–1.20)
Eosinophils Absolute: 0.1 10*3/uL (ref 0.0–0.7)
Hemoglobin: 14.2 g/dL (ref 12.0–15.0)
MCHC: 33.3 g/dL (ref 30.0–36.0)
MCV: 84 fL (ref 78.0–100.0)
Monocytes Absolute: 0.4 10*3/uL (ref 0.2–0.7)
Monocytes Relative: 8 % (ref 3–11)
RBC: 5.07 M/uL (ref 3.87–5.11)
RDW: 12.8 % (ref 11.5–14.0)
TSH: 0.682 microintl units/mL (ref 0.350–5.50)

## 2010-10-07 ENCOUNTER — Ambulatory Visit: Admit: 2010-10-07 | Payer: Self-pay

## 2010-10-07 NOTE — Letter (Signed)
Summary: Meter DownLoad  Meter DownLoad   Imported By: Florinda Marker 09/13/2009 15:01:01  _____________________________________________________________________  External Attachment:    Type:   Image     Comment:   External Document

## 2010-10-07 NOTE — Progress Notes (Signed)
Summary: med refill/gp  Phone Note Refill Request Message from:  Fax from Pharmacy on December 09, 2009 4:21 PM  Refills Requested: Medication #1:  METFORMIN HCL 1000 MG TABS Take 1 tablet by mouth two times a day   Last Refilled: 09/20/2009  Medication #2:  PRAVASTATIN SODIUM 40 MG  TABS Take 1 tablet by mouth once a day   Last Refilled: 09/20/2009  Method Requested: Electronic Initial call taken by: Chinita Pester RN,  December 09, 2009 4:22 PM    Prescriptions: PRAVASTATIN SODIUM 40 MG  TABS (PRAVASTATIN SODIUM) Take 1 tablet by mouth once a day  #31 x 3   Entered and Authorized by:   Jason Coop MD   Signed by:   Jason Coop MD on 12/09/2009   Method used:   Electronically to        [facility] [address] (retail)       [phone]       [place], [place]  [address]       Ph: [phone]       Fax: [phone]   RxID:   [id number] METFORMIN HCL 1000 MG TABS (METFORMIN HCL) Take 1 tablet by mouth two times a day  #60 x 3   Entered and Authorized by:   Jason Coop MD   Signed by:   Jason Coop MD on 12/09/2009   Method used:   Electronically to        [facility] [address] [place] (retail)       [phone]       [place], [place]  [address]       Ph: [phone]       Fax: [phone]   RxID:   [id number]

## 2010-10-07 NOTE — Progress Notes (Signed)
Summary: meter  Phone Note Refill Request Message from:  Fax from Pharmacy on April 14, 2010 10:27 AM  Call from desires the name of the meter that the pat is using.  Thay have been unable to reach the pt.  Call tothe pt message left o call the clinics. Angelina Ok RN  April 14, 2010 10:28 AM    Method Requested: Electronic Initial call taken by: Angelina Ok RN,  April 14, 2010 10:28 AM  Follow-up for Phone Call        Request was for type of lancets. [facility] called and will refills untrasoft lancets. Follow-up by: Merrie Roof RN,  April 16, 2010 1:42 PM  Additional Follow-up for Phone Call Additional follow up Details #1::        Patient is using [employer] Glucometer Additional Follow-up by: Deatra Robinson MD,  April 16, 2010 2:08 PM

## 2010-10-07 NOTE — Assessment & Plan Note (Signed)
Summary: est-ck/fu/meds/cfb   Vital Signs:  Patient profile:   65 year old female Height:      69.4 inches (176.28 cm) Weight:      206.0 pounds (93.64 kg) BMI:     30.18 Temp:     98.3 degrees F (36.83 degrees C) oral Pulse rate:   91 / minute BP sitting:   129 / 76  (right arm)  Vitals Entered By: Chinita Pester RN (April 08, 2010 3:27 PM)   Diabetic Foot Exam Foot Inspection Is there a history of a foot ulcer?              No Is there a foot ulcer now?              No Is there swelling or an abnormal foot shape?          No Are the toenails long?                No Are the toenails thick?                No Are the toenails ingrown?              No Is there heavy callous build-up?              No Is there pain in the calf muscle (Intermittent claudication) when walking?    NoIs there a claw toe deformity?              No Is there elevated skin temperature?            No Is there limited ankle dorsiflexion?            No Is there foot or ankle muscle weakness?            No  Diabetic Foot Care Education  Set Next Diabetic Foot Exam here: 11/05/2009   10-g (5.07) Semmes-Weinstein Monofilament Test           Right Foot          Left Foot Visual Inspection               Test Control      normal         normal Site 1         abnormal         normal Site 2         abnormal         normal Site 3         abnormal         normal Site 4         abnormal         normal Site 5         abnormal         normal Site 6         abnormal         normal Site 7         abnormal         normal Site 8         abnormal         normal Site 9         abnormal         normal Site 10         abnormal         normal  Impression      abnormal  normal  Legend:  Site 1 = Plantar aspect of first toe (center of pad) Site 2 = Plantar aspect of third toe (center of pad) Site 3 = Plantar aspect of fifth toe (center of pad) Site 4 = Plantar aspect of first metatarsal head Site 5 =  Plantar aspect of third metatarsal head Site 6 = Plantar aspect of fifth metatarsal head Site 7 = Plantar aspect of medial midfoot Site 8 = Plantar aspect of lateral midfoot Site 9 = Plantar aspect of heel Site 10 = dorsal aspect of foot between the base of the first and second toes   Result is Abnormal if patient was unable to perceive the monofilament at site indicated.   CC: ED f/u for Bell's Palsy. Is Patient Diabetic? Yes Did you bring your meter with you today? No Pain Assessment Patient in pain? no      Nutritional Status BMI of > 30 = obese CBG Result 198  Have you ever been in a relationship where you felt threatened, hurt or afraid?Unable to ask; someone w/pt/   Does patient need assistance? Functional Status Self care Ambulation Normal   Primary Care Provider:  Carlus Pavlov MD  CC:  ED f/u for Bell's Palsy.Marland Kitchen  History of Present Illness: Follow up apppointment: 1. Bell's pulsy -seen at ER on the day of onset on 7/11. 2. DM -qustions on insulin and hypoglycemia.  Depression History:      The patient denies a depressed mood most of the day and a diminished interest in her usual daily activities.         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) 3)  Postmenopausal Status  (ICD-627.2) 4)  Chest Pain  (ICD-786.50) 5)  Aftercare, Long-term Use, Medications Nec  (ICD-V58.69) 6)  Skin Rash  (ICD-782.1) 7)  Tachycardia  (ICD-785.0) 8)  Cyst, Ovarian Nec/nos  (ICD-620.2) 9)  Fibroids, Uterus  (ICD-218.9) 10)  Deep Venous Thrombophlebitis, Leg, Right, Hx of  (ICD-V12.52) 11)  Mitral Valve Prolapse, Hx of  (ICD-V12.50) 12)  Neop, Malignant, Female Breast,  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) 13)  Examination, Routine Medical  (ICD-V70.0) 14)  Dental Caries  (ICD-521.00) 15)  Knee Pain  (ICD-719.46) 16)  Schizophrenia  (ICD-295.90) 17)  Hypertension  (ICD-401.9) 18)  Hyperlipidemia  (ICD-272.4) 19)  Gerd  (ICD-530.81) 20)  Diabetes Mellitus, Type II  (ICD-250.00)  Current Problems (verified): 1)  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) 2)  Postmenopausal Status  (ICD-627.2) 3)  Chest Pain  (ICD-786.50) 4)  Aftercare, Long-term Use, Medications Nec  (ICD-V58.69) 5)  Skin Rash  (ICD-782.1) 6)  Tachycardia  (ICD-785.0) 7)  Cyst, Ovarian Nec/nos  (ICD-620.2) 8)  Fibroids, Uterus  (ICD-218.9) 9)  Deep Venous Thrombophlebitis, Leg, Right, Hx of  (ICD-V12.52) 10)  Mitral Valve Prolapse, Hx of  (ICD-V12.50) 11)  Neop, Malignant, Female Breast, 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 Take 1 Tablet By Mouth Once A Day 2)  Zyprexa 10 Mg Tabs (Olanzapine) .... Take 1 Tablet By Mouth in Am and 1.5 Tablets At Night 3)  Ranitidine Hcl 150 Mg  Caps (Ranitidine Hcl) .... Take 1 Tablet By Mouth Once A Day 4)  Metformin Hcl 1000 Mg Tabs (Metformin Hcl) .... Take 1 Tablet By Mouth Two Times A Day 5)  Arimidex 1 Mg Tabs (Anastrozole) .... Take 1 Tablet By Mouth Once Daily 6)  Oscal 500/200 D-3 500-200 Mg-Unit Tabs (Calcium-Vitamin D) .... Yake 1 Tablet By Mouth Three Times A Day 7)  Pravastatin Sodium 40 Mg  Tabs (Pravastatin Sodium) .... Take 1 Tablet By Mouth Once A Day 8)  Metoprolol Tartrate 50 Mg  Tabs (Metoprolol Tartrate) .... Take 1 Tablet By Mouth Two Times A Day 9)  Zestril 5 Mg Tabs (Lisinopril) .... Take 1 Pill By Mouth Daily. 10)  Tramadol Hcl 50 Mg   Tabs (Tramadol Hcl) .... Take 1 Tablet By Mouth Every 6 Hours As Needed For Pain Instead of Darvocet. If Pain Not Controlled For 2 Weeks, Then Return To Taking Darvocet. 11)  Lancets  Misc (Lancets) .... Use One Two Times A Day To Check Your Blood Sugar 12)  Pen Needles 31g X 6 Mm Misc (Insulin Pen Needle) .... Use To Inject Insulin Once Daily 13)  Relion 70/30 70-30 % Susp (Insulin Isophane & Regular) .... Please Inject 20 Units 30 Minutes Before Breakfast and 10 Units 30 Minutes Before Dinner. 14)  Onetouch Ultra Test  Strp (Glucose Blood) .... Use 1 Up To 3 Times A Day To Check Your Blood Sugars  Current Medications (verified): 1)  Aspirin 81 Mg Tbec (Aspirin) .... Take 1 Tablet By Mouth Once A Day 2)  Zyprexa 10 Mg Tabs (Olanzapine) .... Take 1 Tablet By Mouth in Am and 1.5 Tablets At Night 3)  Ranitidine Hcl 150 Mg  Caps (Ranitidine Hcl) .... Take 1 Tablet By Mouth Once A Day 4)  Metformin Hcl 1000 Mg Tabs (Metformin Hcl) .... Take 1 Tablet By Mouth Two Times A Day 5)  Arimidex 1 Mg Tabs (Anastrozole) .... Take 1 Tablet By Mouth Once Daily 6)  Oscal 500/200 D-3 500-200 Mg-Unit Tabs (Calcium-Vitamin D) .... Yake 1 Tablet By Mouth Three Times A Day 7)  Pravastatin Sodium 40 Mg  Tabs (Pravastatin Sodium) .... Take 1 Tablet By Mouth Once A Day 8)  Metoprolol Tartrate 50 Mg  Tabs (Metoprolol Tartrate) .... Take 1 Tablet By Mouth Two Times A Day 9)  Zestril 5 Mg Tabs (Lisinopril) .... Take 1 Pill By Mouth Daily. 10)  Tramadol Hcl 50 Mg  Tabs (Tramadol Hcl) .... Take 1 Tablet By Mouth Every 6 Hours As Needed For Pain Instead of Darvocet. If Pain Not Controlled For 2 Weeks, Then Return To Taking Darvocet. 11)  Lancets  Misc (Lancets) .... Use One Two Times A Day To Check Your Blood Sugar 12)  Pen Needles 31g X 6 Mm Misc (Insulin Pen Needle) .... Use To Inject Insulin Once Daily 13)  Relion 70/30 70-30 % Susp (Insulin Isophane & Regular) .... Please Inject 20 Units 30 Minutes Before Breakfast and 10  Units 30 Minutes Before Dinner. 14)  Onetouch Ultra Test  Strp (Glucose Blood) .... Use 1 Up To 3 Times A Day To Check Your Blood Sugars  Allergies (verified): 1)  ! * Penicillin  Directives (verified): 1)  Full Code  Past History:  Past Medical History: Last updated: 02/12/2009 Diabetes mellitus, type II GERD Hyperlipidemia Hypertension Scizophrenia Left breast cancer s/p lumpectomy with radiation  Family History: Last updated: 04/08/2010 moboth parents had DM no cancers  Social History: Last updated: 04/08/2010 Used to work as a Lawyer Retired Widow/Widower  Risk Factors: Alcohol Use: 0 (04/08/2010) Exercise: yes (04/08/2010)  Risk Factors: Smoking Status: quit (04/08/2010)  Family History: moboth parents had DM no cancers  Social History: Used to work as a Lawyer Retired Conservation officer, nature  Review of Systems       per HPI  Physical Exam  General:  alert, well-developed, well-nourished, and well-hydrated.   Head:  Normocephalic and atraumatic without obvious abnormalities. No apparent alopecia or balding. Eyes:  vision grossly intact, pupils equal, pupils round, and pupils reactive to light.   Ears:  R ear normal and L ear normal.   Nose:  no external deformity.   Mouth:  pharynx pink and moist.   Neck:  No deformities, masses, or tenderness noted. Lungs:  Normal respiratory effort, chest expands symmetrically. Lungs are clear to auscultation, no crackles or wheezes. Heart:  normal rate, regular rhythm, no murmur, no gallop, and no rub.   Abdomen:  soft, non-tender, and normal bowel sounds.   Msk:  No deformity or scoliosis noted of thoracic or lumbar spine.   Pulses:  R dorsalis pedis normal, L dorsalis pedis normal, R posterior tibial decreased, and L posterior tibial decreased.   Extremities:  No clubbing, cyanosis, edema, or deformity noted with normal full range of motion of all joints.   Neurologic:  alert & oriented X3, strength normal in all  extremities, sensation intact to pinprick, gait normal, DTRs symmetrical and normal, finger-to-nose normal, and heel-to-shin normal.   left sided facial paresis with left ptosis noted. Skin:  Intact without suspicious lesions or rashes Psych:  Cognition and judgment appear intact. Alert and cooperative with normal attention span and concentration. No apparent delusions, illusions, hallucinations  Diabetes Management Exam:    Foot Exam (with socks and/or shoes not present):       Sensory-Pinprick/Light touch:          Left medial foot (L-4): normal          Left dorsal foot (L-5): normal          Left lateral foot (S-1): normal          Right medial foot (L-4): normal          Right dorsal foot (L-5): normal          Right lateral foot (S-1): normal       Sensory-Monofilament:          Left foot: normal          Right foot: normal       Inspection:          Left foot: normal          Right foot: normal       Nails:          Left foot: normal          Right foot: normal   Impression & Recommendations:  Problem # 1:  HYPERTENSION (ICD-401.9) Controlled. Her updated medication list for this problem includes:    Metoprolol Tartrate 50 Mg Tabs (Metoprolol tartrate) .Marland Kitchen... Take 1 tablet by mouth two times a day    Zestril 5 Mg Tabs (Lisinopril) .Marland Kitchen... Take 1 pill by mouth daily.  BP today: 129/76 Prior BP:  136/80 (10/22/2009)  Labs Reviewed: K+: 4.2 (09/11/2009) Creat: : 0.58 (09/11/2009)   Chol: 143 (09/11/2009)   HDL: 29 (09/11/2009)   LDL: 73 (09/11/2009)   TG: 204 (09/11/2009)  Problem # 2:  HYPERLIPIDEMIA (ICD-272.4) Diet, exercise discussed. Her updated medication list for this problem includes:    Pravastatin Sodium 40 Mg Tabs (Pravastatin sodium) .Marland Kitchen... Take 1 tablet by mouth once a day  Labs Reviewed: SGOT: 12 (09/11/2009)   SGPT: 10 (09/11/2009)   HDL:29 (09/11/2009), 29 (08/21/2008)  LDL:73 (09/11/2009), 40 (08/21/2008)  Chol:143 (09/11/2009), 144 (08/21/2008)   Trig:204 (09/11/2009), 373 (08/21/2008)  Problem # 3:  DIABETES MELLITUS, TYPE II (ICD-250.00) Uncontrolled. Will increase insulin dose and have a close follow up in 1 month. Her updated medication list for this problem includes:    Aspirin 81 Mg Tbec (Aspirin) .Marland Kitchen... Take 1 tablet by mouth once a day    Metformin Hcl 1000 Mg Tabs (Metformin hcl) .Marland Kitchen... Take 1 tablet by mouth two times a day    Zestril 5 Mg Tabs (Lisinopril) .Marland Kitchen... Take 1 pill by mouth daily.    Relion 70/30 70-30 % Susp (Insulin isophane & regular) .Marland Kitchen... Please inject 25 units 30 minutes before breakfast and 15 units 30 minutes before dinner.  Orders: T- Capillary Blood Glucose ([id number]) T-Hgb A1C (in-house) (16109UE) DME Referral (DME) T-Urine Microalbumin w/creat. 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 Discussed etiology and treatment and possible risk factors for this condition. Complete Prednisone taper; and Acyclovir that was Rx-ed in ED (total of 10 days course). 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  Complete Medication List: 1)  Aspirin 81 Mg Tbec (Aspirin) .... Take 1 tablet by mouth once a day 2)  Zyprexa 10 Mg Tabs (Olanzapine) .... Take 1 tablet by mouth in am and 1.5 tablets at night 3)  Ranitidine Hcl 150 Mg Caps (Ranitidine hcl) .... Take 1 tablet by mouth once a day 4)  Metformin Hcl 1000 Mg Tabs (Metformin hcl) .... Take 1 tablet by mouth two times a day 5)  Arimidex 1 Mg Tabs (Anastrozole) .... Take 1 tablet by mouth once daily 6)  Oscal 500/200 D-3 500-200 Mg-unit Tabs (Calcium-vitamin d) .... Yake 1 tablet by mouth three times a day 7)  Pravastatin Sodium 40 Mg Tabs (Pravastatin sodium) .... Take 1 tablet by mouth once a day 8)  Metoprolol Tartrate 50 Mg Tabs (Metoprolol tartrate) .... Take 1 tablet by  mouth two times a day 9)  Zestril 5 Mg Tabs (Lisinopril) .... Take 1 pill by mouth daily. 10)  Tramadol Hcl 50 Mg Tabs (Tramadol hcl) .... Take 1 tablet by mouth every 6 hours as needed for pain instead of darvocet. if pain not controlled for 2 weeks, then return to taking darvocet. 11)  Lancets Misc (Lancets) .... Use one two times a day to check your blood sugar 12)  Pen Needles 31g X 6 Mm Misc (Insulin pen needle) .... Use to inject insulin once daily 13)  Relion 70/30 70-30 % Susp (Insulin isophane & regular) .... Please inject 25 units 30 minutes before breakfast and 15 units 30 minutes before dinner. 14)  Onetouch Ultra Test Strp (Glucose blood) .... Use 1 up to 3 times a day to check your blood sugars  Patient Instructions: 1)  Please, follow up with Ms. Victory Dakin. 2)  Follow up with Dr.  Karimova in 1 month. Prescriptions: TRAMADOL HCL 50 MG  TABS (TRAMADOL HCL) Take 1 tablet by mouth every 6 hours as needed for pain instead of Darvocet. If pain not controlled for 2 weeks, then return to taking Darvocet.  #60 x 5   Entered and Authorized by:   Deatra Robinson MD   Signed by:   Deatra Robinson MD on 04/08/2010   Method used:   Print then Give to Patient   RxID:   [id number] ONETOUCH ULTRA TEST  STRP (GLUCOSE BLOOD) use 1 up to 3 times a day to check your blood sugars  #90 x 11   Entered and Authorized by:   Deatra Robinson MD   Signed by:   Deatra Robinson MD on 04/08/2010   Method used:   Electronically to        [facility] [address] (retail)       [phone]       [place], [place]  [address]       Ph: [phone]       Fax: [phone]   RxID:   [id number] PEN NEEDLES 31G X 6 MM MISC (INSULIN PEN NEEDLE) use to inject insulin once daily  #100 x 11   Entered and Authorized by:   Deatra Robinson MD   Signed by:   Deatra Robinson MD on 04/08/2010   Method used:   Electronically to        [facility] [address] (retail)        [phone]       [place], [place]  [address]       Ph: [phone]       Fax: [phone]   RxID:   [id number] LANCETS  MISC (LANCETS) use one two times a day to check your blood sugar  #100 x 11   Entered and Authorized by:   Deatra Robinson MD   Signed by:   Deatra Robinson MD on 04/08/2010   Method used:   Electronically to        [facility] [address] (retail)       [phone]       [place], [place]  [address]       Ph: [phone]       Fax: [phone]   RxID:   [id number] ZESTRIL 5 MG TABS (LISINOPRIL) take 1 pill by mouth daily.  #30 x 11   Entered and Authorized by:   Deatra Robinson MD   Signed by:   Deatra Robinson MD on 04/08/2010   Method used:   Electronically to        [facility] [address] (retail)       [phone]       [place], [place]  [address]       Ph: [phone]       Fax: [phone]   RxID:   [id number] METOPROLOL TARTRATE 50 MG  TABS (METOPROLOL TARTRATE) Take 1 tablet by mouth two times a day  #180 x 3   Entered and Authorized by:   Deatra Robinson MD   Signed by:   Deatra Robinson MD on 04/08/2010   Method used:   Electronically to        [facility] [address] (retail)       [phone]       [place], [place]  [address]       Ph: [phone]       Fax: [phone]   RxID:   [id number] PRAVASTATIN SODIUM 40 MG  TABS (PRAVASTATIN SODIUM) Take 1 tablet by mouth once a day  #31 x 11   Entered and Authorized by:   Deatra Robinson MD   Signed by:   Deatra Robinson MD on 04/08/2010   Method used:   Electronically to        [facility] [address] (retail)       [phone]       [place], [place]  [address]       Ph: [phone]       Fax: [phone]   RxID:   [id number] OSCAL 500/200 D-3 500-200 MG-UNIT TABS (CALCIUM-VITAMIN D) yake 1 tablet by  mouth three times a day  #90 x 11   Entered and Authorized by:   Deatra Robinson MD   Signed by:   Deatra Robinson MD on 04/08/2010   Method used:   Electronically to        [facility] [address] (retail)       [phone]       [place], [place]  [address]       Ph: [phone]       Fax: [phone]   RxID:   [id number] METFORMIN HCL 1000 MG TABS (METFORMIN HCL) Take 1 tablet by mouth two times a day  #60 x 11   Entered and Authorized by:   Deatra Robinson MD   Signed by:   Deatra Robinson MD on 04/08/2010   Method used:   Electronically to        [facility] [address] (retail)       [phone]       [place], [place]  [address]       Ph: [phone]       Fax: [phone]   RxID:   [id number] RANITIDINE HCL 150 MG  CAPS (RANITIDINE HCL) Take 1 tablet by mouth once a day  #31 x 11   Entered and Authorized by:   Deatra Robinson MD   Signed by:   Deatra Robinson MD on 04/08/2010   Method used:   Electronically to        [facility] [address] (retail)       [phone]       [place], [place]  [address]       Ph: [phone]       Fax: [phone]   RxID:   [id number] RELION 70/30 70-30 % SUSP (INSULIN ISOPHANE & REGULAR) Please inject 25 units 30 minutes before breakfast and 15 units 30 minutes before dinner.  #100 x 11   Entered and Authorized by:   Deatra Robinson MD   Signed by:   Deatra Robinson MD on 04/08/2010   Method used:   Electronically to        [facility] [address] (retail)       [phone]       [place], [place]  [address]       Ph: [phone]  Fax: [phone]   RxID:   [id number]   Prevention & Chronic Care Immunizations   Influenza vaccine: refuses  (08/21/2008)   Influenza vaccine deferral: Refused  (09/11/2009)    Tetanus booster: Not documented   Td booster deferral: Refused   (09/11/2009)    Pneumococcal vaccine: Not documented    H. zoster vaccine: Not documented   H. zoster vaccine deferral: Deferred  (10/22/2009)  Colorectal Screening   Hemoccult: Not documented   Hemoccult action/deferral: Deferred  (10/22/2009)    Colonoscopy: Not documented   Colonoscopy action/deferral: Refused  (10/22/2009)  Other Screening   Pap smear: Normal  (06/25/2006)   Pap smear action/deferral: Refused  (04/08/2010)    Mammogram: BI-RADS CATEGORY 2:  Benign finding(s).^MM DIGITAL DIAGNOSTIC BILAT  (10/07/2009)   Mammogram due: 10/07/2010    DXA bone density scan: Not documented  Reports requested:  Smoking status: quit  (04/08/2010)  Diabetes Mellitus   HgbA1C: 10.3  (04/08/2010)   Hemoglobin A1C due: 07/09/2010    Eye exam: Not documented   Last eye exam report requested.   Diabetic eye exam action/deferral: Ophthalmology referral  (04/08/2010)    Foot exam: yes  (04/08/2010)   Foot exam action/deferral: Do today   High risk foot: No  (08/07/2009)   Foot care education: Not documented   Foot exam due: 11/05/2009    Urine microalbumin/creatinine ratio: 33.3  (09/11/2009)   Urine microalbumin action/deferral: Ordered   Urine microalbumin/cr due: 04/09/2011    Diabetes flowsheet reviewed?: Yes   Progress toward A1C goal: Unchanged  Lipids   Total Cholesterol: 143  (09/11/2009)   LDL: 73  (09/11/2009)   LDL Direct: Not documented   HDL: 29  (09/11/2009)   Triglycerides: 204  (09/11/2009)    SGOT (AST): 12  (09/11/2009)   SGPT (ALT): 10  (09/11/2009)   Alkaline phosphatase: 104  (09/11/2009)   Total bilirubin: 0.7  (09/11/2009)  Hypertension   Last Blood Pressure: 129 / 76  (04/08/2010)   Serum creatinine: 0.58  (09/11/2009)   Serum potassium 4.2  (09/11/2009)  Self-Management Support :   Personal Goals (by the next clinic visit) :     Personal A1C goal: 7  (08/07/2009)     Personal blood pressure goal: 140/90  (08/07/2009)     Personal LDL  goal: 70  (08/07/2009)    Patient will work on the following items until the next clinic visit to reach self-care goals:     Medications and monitoring: check my blood sugar, bring all of my medications to every visit, examine my feet every day  (04/08/2010)     Eating: eat more vegetables, use fresh or frozen vegetables, eat foods that are low in salt, eat baked foods instead of fried foods, eat fruit for snacks and desserts  (04/08/2010)     Activity: take a 30 minute walk every day  (04/08/2010)    Diabetes self-management support: Written self-care plan  (04/08/2010)   Diabetes care plan printed   Last diabetes self-management training by diabetes educator: 06/17/2009    Hypertension self-management support: Written self-care plan  (04/08/2010)   Hypertension self-care plan printed.    Lipid self-management support: Written self-care plan  (04/08/2010)   Lipid self-care plan printed.   Nursing Instructions: Refer for screening diabetic eye exam (see order) Request report of last diabetic eye exam Diabetic foot exam today   Process Orders Check Orders Results:     [facility]: Check successful Tests Sent for requisitioning (April 09, 2010  10:29 AM):     04/08/2010: [facility] -- T-Urine Microalbumin w/creat. ratio [[id number]] (signed)     Laboratory Results   Blood Tests   Date/Time Received: April 08, 2010 3:43 PM Date/Time Reported: Alric Quan  April 08, 2010 3:43 PM   HGBA1C: 10.3%   (Normal Range: Non-Diabetic - 3-6%   Control Diabetic - 6-8%) CBG Random:: 198mg /dL

## 2010-10-07 NOTE — Consult Note (Signed)
Summary: [facility]   Imported By: Florinda Marker 10/16/2009 14:58:16  _____________________________________________________________________  External Attachment:    Type:   Image     Comment:   External Document

## 2010-10-07 NOTE — Progress Notes (Signed)
Summary: refill/gg  Phone Note Refill Request  on May 01, 2010 11:58 AM  Refills Requested: Medication #1:  TRAMADOL HCL 50 MG  TABS Take 1 tablet by mouth every 6 hours as needed for pain instead of Darvocet. If pain not controlled for 2 weeks   Last Refilled: 03/07/2010  Method Requested: Electronic Initial call taken by: Merrie Roof RN,  May 01, 2010 11:59 AM  Follow-up for Phone Call        Rx denied because was refilled on 04/08/2010 with 5 Rf. Follow-up by: Deatra Robinson MD,  May 01, 2010 3:47 PM

## 2010-10-07 NOTE — Assessment & Plan Note (Signed)
Summary: CLASS/SB.   Primary Care Provider:  Carlus Pavlov MD   History of Present Illness: Follow up appointment. Denies any health concerns. Reports taking all her medication as instructed; no side-effects. Patient continues to refuse all preventative health screenign recs and vaccinations (flu, Tdap and pneumovax).  Medications Prior to Update: 1)  Aspirin 81 Mg Tbec (Aspirin) .... Take 1 Tablet By Mouth Once A Day 2)  Zyprexa 10 Mg Tabs (Olanzapine) .... Take 1 Tablet By Mouth in Am and 1.5 Tablets At Night 3)  Ranitidine Hcl 150 Mg  Caps (Ranitidine Hcl) .... Take 1 Tablet By Mouth Once A Day 4)  Metformin Hcl 1000 Mg Tabs (Metformin Hcl) .... Take 1 Tablet By Mouth Two Times A Day 5)  Arimidex 1 Mg Tabs (Anastrozole) .... Take 1 Tablet By Mouth Once Daily 6)  Oscal 500/200 D-3 500-200 Mg-Unit Tabs (Calcium-Vitamin D) .... Yake 1 Tablet By Mouth Three Times A Day 7)  Pravastatin Sodium 40 Mg  Tabs (Pravastatin Sodium) .... Take 1 Tablet By Mouth Once A Day 8)  Metoprolol Tartrate 50 Mg  Tabs (Metoprolol Tartrate) .... Take 1 Tablet By Mouth Two Times A Day 9)  Zestril 5 Mg Tabs (Lisinopril) .... Take 1 Pill By Mouth Daily. 10)  Tramadol Hcl 50 Mg  Tabs (Tramadol Hcl) .... Take 1 Tablet By Mouth Every 6 Hours As Needed For Pain Instead of Darvocet. If Pain Not Controlled For 2 Weeks, Then Return To Taking Darvocet. 11)  Lancets  Misc (Lancets) .... Use One Two Times A Day To Check Your Blood Sugar 12)  Pen Needles 31g X 6 Mm Misc (Insulin Pen Needle) .... Use To Inject Insulin Once Daily 13)  Relion 70/30 70-30 % Susp (Insulin Isophane & Regular) .... Please Inject 25 Units 30 Minutes Before Breakfast and 15 Units 30 Minutes Before Dinner. 14)  Onetouch Ultra Test  Strp (Glucose Blood) .... Use 1 Up To 3 Times A Day To Check Your Blood Sugars  Current Medications (verified): 1)  Aspirin 81 Mg Tbec (Aspirin) .... Take 1 Tablet By Mouth Once A Day 2)  Zyprexa 10 Mg Tabs (Olanzapine)  .... Take 1 Tablet By Mouth in Am and 1.5 Tablets At Night 3)  Ranitidine Hcl 150 Mg  Caps (Ranitidine Hcl) .... Take 1 Tablet By Mouth Once A Day 4)  Metformin Hcl 1000 Mg Tabs (Metformin Hcl) .... Take 1 Tablet By Mouth Two Times A Day 5)  Arimidex 1 Mg Tabs (Anastrozole) .... Take 1 Tablet By Mouth Once Daily 6)  Oscal 500/200 D-3 500-200 Mg-Unit Tabs (Calcium-Vitamin D) .... Yake 1 Tablet By Mouth Three Times A Day 7)  Pravastatin Sodium 40 Mg  Tabs (Pravastatin Sodium) .... Take 1 Tablet By Mouth Once A Day 8)  Metoprolol Tartrate 50 Mg  Tabs (Metoprolol Tartrate) .... Take 1 Tablet By Mouth Two Times A Day 9)  Zestril 5 Mg Tabs (Lisinopril) .... Take 1 Pill By Mouth Daily. 10)  Tramadol Hcl 50 Mg  Tabs (Tramadol Hcl) .... Take 1 Tablet By Mouth Every 6 Hours As Needed For Pain Instead of Darvocet. If Pain Not Controlled For 2 Weeks, Then Return To Taking Darvocet. 11)  Lancets  Misc (Lancets) .... Use One Two Times A Day To Check Your Blood Sugar 12)  Pen Needles 31g X 6 Mm Misc (Insulin Pen Needle) .... Use To Inject Insulin Once Daily 13)  Relion 70/30 70-30 % Susp (Insulin Isophane & Regular) .... Please Inject 25 Units 30 Minutes  Before Breakfast and 15 Units 30 Minutes Before Dinner. 14)  Onetouch Ultra Test  Strp (Glucose Blood) .... Use 1 Up To 3 Times A Day To Check Your Blood Sugars  Allergies (verified): 1)  ! * Penicillin  Past History:  Past Medical History: Last updated: 02/12/2009 Diabetes mellitus, type II GERD Hyperlipidemia Hypertension Scizophrenia Left breast cancer s/p lumpectomy with radiation  Family History: Last updated: 04/08/2010 moboth parents had DM no cancers  Social History: Last updated: 04/08/2010 Used to work as a Lawyer Retired Widow/Widower  Risk Factors: Smoking Status: quit (04/08/2010)  Review of Systems  The patient denies anorexia, fever, weight loss, weight gain, vision loss, decreased hearing, hoarseness, chest pain, syncope,  dyspnea on exertion, peripheral edema, prolonged cough, headaches, hemoptysis, abdominal pain, melena, hematochezia, severe indigestion/heartburn, hematuria, incontinence, genital sores, muscle weakness, suspicious skin lesions, depression, unusual weight change, abnormal bleeding, enlarged lymph nodes, and breast masses.    Physical Exam  General:  alert, well-developed, well-nourished, and well-hydrated.   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 Head:  Normocephalic and atraumatic without obvious abnormalities. No apparent alopecia or balding. Eyes:  vision grossly intact, pupils equal, pupils round, and pupils reactive to light.   Ears:  R ear normal and L ear normal.   Nose:  no external deformity.   Mouth:  pharynx pink and moist.   Neck:  No deformities, masses, or tenderness noted. Lungs:  Normal respiratory effort, chest expands symmetrically. Lungs are clear to auscultation, no crackles or wheezes. Heart:  normal rate, regular rhythm, no murmur, no gallop, and no rub.   Abdomen:  soft, non-tender, and normal bowel sounds.   Msk:  No deformity or scoliosis noted of thoracic or lumbar spine.   Skin:  left ankle anterior aspect with a brown elevated nevus with irregular borders ("growing" in size per patient); approxiamatelt 5cm x 3 cm in diameter. Psych:  Cognition and judgment appear intact. Alert and cooperative with normal attention span and concentration. No apparent delusions, illusions, hallucinations  Diabetes Management Exam:    Foot Exam (with socks and/or shoes not present):       Sensory-Pinprick/Light touch:          Left medial foot (L-4): normal          Left dorsal foot (L-5): normal          Left lateral foot (S-1): normal          Right medial foot (L-4): normal          Right dorsal foot (L-5): normal          Right lateral foot (S-1): normal       Sensory-Monofilament:          Left foot: normal          Right foot: normal       Inspection:          Left foot: normal           Right foot: normal       Nails:          Left foot: normal          Right foot: normal    Foot Exam by Podiatrist:       Date: 08/12/2010       Results: no diabetic findings       Done by: Frederich Chick Exam:       Eye Exam done here today  Results: normal   Impression & Recommendations:  Problem # 1:  HYPERTENSION (ICD-401.9) Assessment Unchanged  Her updated medication list for this problem includes:    Metoprolol Tartrate 50 Mg Tabs (Metoprolol tartrate) .Marland Kitchen... Take 1 tablet by mouth two times a day    Zestril 5 Mg Tabs (Lisinopril) .Marland Kitchen... Take 1 pill by mouth daily.  Prior BP: 129/76 (04/08/2010)  Labs Reviewed: K+: 4.2 (09/11/2009) Creat: : 0.58 (09/11/2009)   Chol: 143 (09/11/2009)   HDL: 29 (09/11/2009)   LDL: 73 (09/11/2009)   TG: 204 (09/11/2009)  Problem # 2:  DIABETES MELLITUS, TYPE II (ICD-250.00)  Her updated medication list for this problem includes:    Aspirin 81 Mg Tbec (Aspirin) .Marland Kitchen... Take 1 tablet by mouth once a day    Metformin Hcl 1000 Mg Tabs (Metformin hcl) .Marland Kitchen... Take 1 tablet by mouth two times a day    Zestril 5 Mg Tabs (Lisinopril) .Marland Kitchen... Take 1 pill by mouth daily.    Relion 70/30 70-30 % Susp (Insulin isophane & regular) .Marland Kitchen... Please inject 25 units 30 minutes before breakfast and 15 units 30 minutes before dinner.    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  Orders: Dermatology Referral (Derma)  Complete Medication List: 1)  Aspirin 81 Mg Tbec (Aspirin) .... Take 1 tablet by mouth once a day 2)  Zyprexa 10 Mg Tabs (Olanzapine) .... Take 1 tablet by mouth in am and 1.5  tablets at night 3)  Ranitidine Hcl 150 Mg Caps (Ranitidine hcl) .... Take 1 tablet by mouth once a day 4)  Metformin Hcl 1000 Mg Tabs (Metformin hcl) .... Take 1 tablet by mouth two times a day 5)  Arimidex 1 Mg Tabs (Anastrozole) .... Take 1 tablet by mouth once daily 6)  Oscal 500/200 D-3 500-200 Mg-unit Tabs (Calcium-vitamin d) .... Yake 1 tablet by mouth three times a day 7)  Pravastatin Sodium 40 Mg Tabs (Pravastatin sodium) .... Take 1 tablet by mouth once a day 8)  Metoprolol Tartrate 50 Mg Tabs (Metoprolol tartrate) .... Take 1 tablet by mouth two times a day 9)  Zestril 5 Mg Tabs (Lisinopril) .... Take 1 pill by mouth daily. 10)  Tramadol Hcl 50 Mg Tabs (Tramadol hcl) .... Take 1 tablet by mouth every 6 hours as needed for pain instead of darvocet. if pain not controlled for 2 weeks, then return to taking darvocet. 11)  Lancets Misc (Lancets) .... Use one two times a day to check your blood sugar 12)  Pen Needles 31g X 6 Mm Misc (Insulin pen needle) .... Use to inject insulin once daily 13)  Relion 70/30 70-30 % Susp (Insulin isophane & regular) .... Please inject 25 units 30 minutes before breakfast and 15 units 30 minutes before dinner. 14)  Onetouch Ultra Test Strp (Glucose blood) .... Use 1 up to 3 times a day to check your blood sugars  Patient Instructions: 1)  Please, follow up with a skin specialist to evaluate your Left ankle mole. 2)  Please, call with any questions. 3)  Please, return to clinic in 8 weeks (on empty stomach).   Orders Added: 1)  Est. Patient Level IV [16109] 2)  Dermatology Referral [Derma]    Prevention & Chronic Care Immunizations   Influenza vaccine: refuses  (08/21/2008)   Influenza vaccine deferral: Refused  (09/11/2009)    Tetanus booster: Not documented   Td booster deferral: Refused  (09/11/2009)    Pneumococcal vaccine: Not documented   Pneumococcal vaccine due: 08/13/2015    H. zoster vaccine: Not documented   H. zoster vaccine  deferral: Not available  (08/12/2010)  Colorectal Screening   Hemoccult: Not documented   Hemoccult action/deferral: Refused  (08/12/2010)    Colonoscopy: Not documented   Colonoscopy action/deferral: Refused  (10/22/2009)  Other Screening   Pap smear: Normal  (06/25/2006)   Pap smear action/deferral: Refused  (04/08/2010)    Mammogram: BI-RADS CATEGORY 2:  Benign finding(s).^MM DIGITAL DIAGNOSTIC BILAT  (10/07/2009)   Mammogram due: 10/07/2010    DXA bone density scan: Not documented   DXA bone density action/deferral: Refused  (08/12/2010)   Smoking status: quit  (04/08/2010)  Diabetes Mellitus   HgbA1C: 8.2  (08/12/2010)   HgbA1C action/deferral: Ordered  (08/12/2010)   Hemoglobin A1C due: 11/11/2010    Eye exam: normal  (08/12/2010)   Diabetic eye exam action/deferral: Ophthalmology referral  (04/08/2010)   Eye exam due: 08/2011    Foot exam: yes  (08/12/2010)   Foot exam action/deferral: Do today   High risk foot: No  (08/07/2009)   Foot care education: Not documented   Foot exam due: 11/05/2009    Urine microalbumin/creatinine ratio: 9.2  (04/09/2010)   Urine microalbumin action/deferral: Ordered   Urine microalbumin/cr due: 04/10/2011    Diabetes flowsheet reviewed?: Yes   Progress toward A1C goal: Unchanged    Stage of readiness to change (diabetes management): Preparation  Lipids   Total Cholesterol: 143  (09/11/2009)   LDL: 73  (09/11/2009)   LDL Direct: Not documented   HDL: 29  (09/11/2009)   Triglycerides: 204  (09/11/2009)   Lipid panel due: 09/11/2010    SGOT (AST): 12  (09/11/2009)   SGPT (ALT): 10  (09/11/2009)   Alkaline phosphatase: 104  (09/11/2009)   Total bilirubin: 0.7  (09/11/2009)   Liver panel due: 09/11/2010    Lipid flowsheet reviewed?: Yes   Progress toward LDL goal: Unchanged    Stage of readiness to change (lipid management): Preparation  Hypertension   Last Blood Pressure: 129 / 76  (04/08/2010)   Serum creatinine:  0.58  (09/11/2009)   Serum potassium 4.2  (09/11/2009)   Basic metabolic panel due: 09/11/2010    Hypertension flowsheet reviewed?: Yes   Progress toward BP goal: Unchanged    Stage of readiness to change (hypertension management): Maintenance  Self-Management Support :   Personal Goals (by the next clinic visit) :     Personal A1C goal: 7  (08/07/2009)     Personal blood pressure goal: 140/90  (08/07/2009)     Personal LDL goal: 70  (08/07/2009)    Diabetes self-management support: Written self-care plan  (04/08/2010)   Last diabetes self-management training by diabetes educator: 06/17/2009    Hypertension self-management support: Written self-care plan  (04/08/2010)    Lipid self-management support: Written self-care plan  (04/08/2010)    Nursing Instructions: Give Pneumovax today HgbA1C today (see order)    Laboratory Results   Blood Tests   Date/Time Received: August 12, 2010 10:48 AM  Date/Time Reported: Burke Keels  August 12, 2010 10:48 AM   HGBA1C: 8.2%   (Normal Range: Non-Diabetic - 3-6%   Control Diabetic - 6-8%) CBG  Random:: 237mg /dL

## 2010-10-07 NOTE — Assessment & Plan Note (Signed)
Summary: CHECKUP/SB.   Vital Signs:  Patient profile:   65 year old female Height:      69.4 inches (176.28 cm) Weight:      208.5 pounds (94.77 kg) BMI:     30.55 Temp:     99.4 degrees F oral Pulse rate:   101 / minute BP sitting:   141 / 85  (right arm) Cuff size:   large  Vitals Entered By: Chinita Pester RN (August 12, 2010 10:08 AM) CC: F/U  visit. Med. refills. Is Patient Diabetic? Yes Did you bring your meter with you today? No Pain Assessment Patient in pain? no      Nutritional Status BMI of > 30 = obese  Have you ever been in a relationship where you felt threatened, hurt or afraid?Unable to ask;someone w/pt.   Does patient need assistance? Functional Status Self care Ambulation Normal   Primary Care Provider:  Carlus Pavlov MD  CC:  F/U  visit. Med. refills..  History of Present Illness: Follow up appointment. Denies any concerns. Takes all her meds as R-ex-ed. Denies any side-effects  Depression History:      The patient denies a depressed mood most of the day and a diminished interest in her usual daily activities.         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) 3)  Hypertension  (ICD-401.9) 4)  Postmenopausal Status  (ICD-627.2) 5)  Chest Pain  (ICD-786.50) 6)  Aftercare, Long-term Use, Medications Nec  (ICD-V58.69) 7)  Skin Rash  (ICD-782.1) 8)  Tachycardia  (ICD-785.0) 9)  Hyperlipidemia  (ICD-272.4) 10)  Mitral Valve Prolapse, Hx of  (ICD-V12.50) 11)  Neop, Malignant, Female Breast, 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 Take 1 Tablet By Mouth Once A Day 2)  Zyprexa 10 Mg Tabs (Olanzapine) .... Take 1 Tablet By Mouth in Am and 1.5 Tablets At Night 3)  Ranitidine Hcl 150 Mg  Caps (Ranitidine Hcl) .... Take 1 Tablet By Mouth Once A Day 4)  Metformin Hcl 1000 Mg Tabs (Metformin Hcl) .... Take 1 Tablet By Mouth Two Times A Day 5)  Arimidex 1 Mg Tabs (Anastrozole) .... Take 1 Tablet By Mouth Once Daily 6)  Oscal 500/200 D-3 500-200 Mg-Unit Tabs (Calcium-Vitamin D) .... Yake 1 Tablet By Mouth Three Times A Day 7)  Pravastatin Sodium 40 Mg  Tabs (Pravastatin Sodium) .... Take 1 Tablet By Mouth Once A Day 8)  Metoprolol Tartrate 50 Mg  Tabs (Metoprolol Tartrate) .... Take 1 Tablet By Mouth Two Times A Day 9)  Zestril 5 Mg Tabs (Lisinopril) .... Take 1 Pill By Mouth Daily. 10)  Tramadol Hcl 50 Mg  Tabs (Tramadol Hcl) .... Take 1 Tablet By  Mouth Every 6 Hours As Needed For Pain Instead of Darvocet. If Pain Not Controlled For 2 Weeks, Then Return To Taking Darvocet. 11)  Lancets  Misc (Lancets) .... Use One Two Times A Day To Check Your Blood Sugar 12)  Pen Needles 31g X 6 Mm Misc (Insulin Pen Needle) .... Use To Inject Insulin Once Daily 13)  Relion 70/30 70-30 % Susp (Insulin Isophane & Regular) .... Please Inject 25 Units 30 Minutes Before Breakfast and 15 Units 30 Minutes Before Dinner. 14)  Onetouch Ultra Test  Strp (Glucose Blood) .... Use 1 Up To 3 Times A Day To Check Your Blood Sugars  Allergies (verified): 1)  ! * Penicillin  Directives (verified): 1)  Full Code   Past History:  Past medical, surgical, family and social histories (including risk factors) reviewed, and no changes noted (except as noted  below).  Past Medical History: Reviewed history from 02/12/2009 and no changes required. 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 moboth parents had DM no cancers  Social History: Reviewed history from 04/08/2010 and no changes required. Used to work as a Lawyer Retired Conservation officer, [employer]  Physical Exam  General:  alert, well-developed, well-nourished, and well-hydrated.   Eyes:  No corneal or conjunctival inflammation noted. EOMI. Perrla. Funduscopic exam benign, without hemorrhages, exudates or papilledema. Vision grossly normal. Mouth:  Oral mucosa and oropharynx without lesions or exudates.  Teeth in good repair. Neck:  No deformities, masses, or tenderness noted. Lungs:  Normal respiratory effort, chest expands symmetrically. Lungs are clear to auscultation, no crackles or wheezes. Heart:  normal rate, regular rhythm, no murmur, no gallop, and no rub.   Abdomen:  Bowel sounds positive,abdomen soft and non-tender without masses, organomegaly or hernias noted. Msk:  No deformity or scoliosis noted of thoracic or lumbar spine.   Pulses:  R dorsalis pedis normal, L dorsalis pedis normal, R posterior tibial decreased, and L posterior tibial decreased.   Extremities:  No clubbing, cyanosis, edema, or deformity noted with normal full range of motion of all joints.   Neurologic:  alert & oriented X3, cranial nerves II-XII intact, sensation intact to light touch, gait normal, and DTRs symmetrical and normal.   Skin:  left ankle antreior surface nevus with raised and irregular borders 5 cmx 3 cm in diameter; black in coloration Cervical Nodes:  No lymphadenopathy noted Psych:  Cognition and judgment appear intact. Alert and cooperative with normal attention span and concentration. No apparent delusions, illusions,  hallucinations  Diabetes Management Exam:    Foot Exam (with socks and/or shoes not present):       Sensory-Pinprick/Light touch:          Left medial foot (L-4): normal          Left dorsal foot (L-5): normal          Left lateral foot (S-1): normal          Right medial foot (L-4): normal          Right dorsal foot (L-5): normal          Right lateral foot (S-1): normal       Sensory-Monofilament:          Left foot: normal          Right foot: normal       Inspection:          Left foot:  normal          Right foot: normal       Nails:          Left foot: normal          Right foot: normal    Foot Exam by Podiatrist:       Date: 08/12/2010       Results: early diabetic findings       Done by: Frederich Chick Exam:       Eye Exam done here today          Results: normal   Impression & Recommendations:  Problem # 1:  DIABETES MELLITUS, TYPE II (ICD-250.00) Assessment Improved Imroved control with adheraqnce with treatment regimen plus diet/exercise. Foot care addressed. Will not change her regimen at this time. Will reevaluate in 12 weeks. Her updated medication list for this problem includes:    Aspirin 81 Mg Tbec (Aspirin) .Marland Kitchen... Take 1 tablet by mouth once a day    Metformin Hcl 1000 Mg Tabs (Metformin hcl) .Marland Kitchen... Take 1 tablet by mouth two times a day    Zestril 5 Mg Tabs (Lisinopril) .Marland Kitchen... Take 1 pill by mouth daily.    Relion 70/30 70-30 % Susp (Insulin isophane & regular) .Marland Kitchen... Please inject 25 units 30 minutes before breakfast and 15 units 30 minutes before dinner.  Orders: T- Capillary Blood Glucose (32355) T-Hgb A1C (in-house) (73220UR)  Labs Reviewed: Creat: 0.58 (09/11/2009)    Reviewed HgBA1c results: 10.3 (04/08/2010)  11.3 (09/11/2009)  Problem # 2:  HYPERTENSION (ICD-401.9) Assessment: Unchanged No change in regimen. Her updated medication list for this problem includes:    Metoprolol Tartrate 50 Mg Tabs (Metoprolol tartrate) .Marland Kitchen... Take 1 tablet by  mouth two times a day    Zestril 5 Mg Tabs (Lisinopril) .Marland Kitchen... Take 1 pill by mouth daily.  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 Take 1 tablet by mouth once a day 2)  Zyprexa 10 Mg Tabs (Olanzapine) .... Take 1 tablet by mouth in am and 1.5 tablets at night 3)  Ranitidine Hcl 150 Mg Caps (Ranitidine hcl) .... Take 1 tablet by mouth once a day 4)  Metformin Hcl 1000 Mg Tabs (Metformin hcl) .... Take 1 tablet by mouth two times a day 5)  Arimidex 1 Mg Tabs (Anastrozole) .... Take 1 tablet by mouth once daily 6)  Oscal 500/200 D-3 500-200 Mg-unit Tabs (Calcium-vitamin d) .... Yake 1 tablet by mouth three times a day 7)  Pravastatin Sodium 40 Mg Tabs (Pravastatin sodium) .... Take 1 tablet by mouth once a day 8)  Metoprolol Tartrate 50 Mg Tabs (Metoprolol tartrate) .... Take 1 tablet by mouth two times a day 9)  Zestril 5 Mg Tabs (Lisinopril) .... Take 1 pill by mouth daily. 10)  Tramadol Hcl 50 Mg Tabs (Tramadol hcl) .... Take 1 tablet by mouth every 6 hours as needed for pain instead of darvocet. if pain not controlled for 2 weeks, then return to taking darvocet. 11)  Lancets Misc (Lancets) .... Use one two times a day to check your blood sugar 12)  Pen Needles 31g X 6 Mm Misc (Insulin pen needle) .Marland KitchenMarland KitchenMarland Kitchen  Use to inject insulin once daily 13)  Relion 70/30 70-30 % Susp (Insulin isophane & regular) .... Please inject 25 units 30 minutes before breakfast and 15 units 30 minutes before dinner. 14)  Onetouch Ultra Test Strp (Glucose blood) .... Use 1 up to 3 times a day to check your blood sugars   Orders Added: 1)  T- Capillary Blood Glucose [82948] 2)  T-Hgb A1C (in-house)  [83036QW] 3)  Est. Patient Level IV [09811] 4)  Dermatology Referral [Derma]     Prevention & Chronic Care Immunizations   Influenza vaccine: refuses  (08/21/2008)   Influenza vaccine deferral: Refused  (09/11/2009)    Tetanus booster: Not documented   Td booster deferral: Refused  (09/11/2009)    Pneumococcal vaccine: Not documented    H. zoster vaccine: Not documented   H. zoster vaccine deferral: Deferred  (10/22/2009)  Colorectal Screening   Hemoccult: Not documented   Hemoccult action/deferral: Deferred  (10/22/2009)    Colonoscopy: Not documented   Colonoscopy action/deferral: Refused  (10/22/2009)  Other Screening   Pap smear: Normal  (06/25/2006)   Pap smear action/deferral: Refused  (04/08/2010)    Mammogram: BI-RADS CATEGORY 2:  Benign finding(s).^MM DIGITAL DIAGNOSTIC BILAT  (10/07/2009)   Mammogram due: 10/07/2010    DXA bone density scan: Not documented   Smoking status: quit  (08/12/2010)  Diabetes Mellitus   HgbA1C: 10.3  (04/08/2010)   Hemoglobin A1C due: 07/09/2010    Eye exam: normal  (08/12/2010)   Diabetic eye exam action/deferral: Ophthalmology referral  (04/08/2010)   Eye exam due: 08/2011    Foot exam: yes  (08/12/2010)   Foot exam action/deferral: Do today   High risk foot: No  (08/07/2009)   Foot care education: Not documented   Foot exam due: 11/05/2009    Urine microalbumin/creatinine ratio: 9.2  (04/09/2010)   Urine microalbumin action/deferral: Ordered   Urine microalbumin/cr due: 04/09/2011  Lipids   Total Cholesterol: 143  (09/11/2009)   LDL: 73  (09/11/2009)   LDL Direct: Not documented   HDL: 29  (09/11/2009)   Triglycerides: 204  (09/11/2009)    SGOT (AST): 12  (09/11/2009)   SGPT (ALT): 10  (09/11/2009)   Alkaline phosphatase: 104  (09/11/2009)   Total bilirubin: 0.7  (09/11/2009)  Hypertension   Last Blood Pressure: 141 / 85  (08/12/2010)   Serum creatinine: 0.58  (09/11/2009)   Serum potassium 4.2   (09/11/2009)  Self-Management Support :   Personal Goals (by the next clinic visit) :     Personal A1C goal: 7  (08/07/2009)     Personal blood pressure goal: 140/90  (08/07/2009)     Personal LDL goal: 70  (08/07/2009)    Patient will work on the following items until the next clinic visit to reach self-care goals:     Medications and monitoring: take my medicines every day, bring all of my medications to every visit, examine my feet every day  (08/12/2010)     Eating: drink diet soda or water instead of juice or soda, eat more vegetables, use fresh or frozen vegetables, eat foods that are low in salt, eat baked foods instead of fried foods, eat fruit for snacks and desserts  (08/12/2010)     Activity: take a 30 minute walk every day  (08/12/2010)    Diabetes self-management support: Education handout, Written self-care plan  (08/12/2010)   Diabetes care plan printed   Diabetes education handout printed   Last diabetes self-management training by diabetes educator: 06/17/2009  Hypertension self-management support: Education handout, Written self-care plan  (08/12/2010)   Hypertension self-care plan printed.   Hypertension education handout printed    Lipid self-management support: Education handout, Written self-care plan  (08/12/2010)   Lipid self-care plan printed.   Lipid education handout printed   Appended Document: Lab Order Results of CBG and HGB A1C    Lab Visit  Laboratory Results   Blood Tests   Date/Time Received: August 12, 2010 10:50 AM  Date/Time Reported: Burke Keels  August 12, 2010 10:50 AM   HGBA1C: 8.2%   (Normal Range: Non-Diabetic - 3-6%   Control Diabetic - 6-8%) CBG Random:: 237mg /dL    Orders Today:

## 2010-10-07 NOTE — Progress Notes (Signed)
Summary: [facility] call for pt appointment  Phone Note From Other Clinic   Caller: Provider Reason for Call: Schedule Patient Appt Summary of Call: Pt presented to [facility] with complains of left sided facial droop. She says she woke up with it. She had extensive workup including MRI, CT head with angio and other routine labs which ruled out stroke/ vascular clot in brain circulation. Pt does have history of schizophrenia and ED physiciian felt that she was incosistant in history and her diagnostic workup was negative. Therefore, she should simply be followed in the clinic at some point this week. Please make a follow up appointment for patient in our clinic.  Initial call taken by: Clerance Lav MD,  March 17, 2010 9:52 PM  Follow-up for Phone Call       Follow-up by: Clerance Lav MD,  March 17, 2010 9:52 PM     Appended Document: [facility] call for pt appointment Patient wants to come and see her primary Dr.Karimova and will wait until 04/08/2010 at 3:00pm.  She will call us if she needs to come sooner.

## 2010-10-07 NOTE — Consult Note (Signed)
Summary: [facility]  [facility]   Imported By: Louretta Parma 06/11/2010 15:45:44  _____________________________________________________________________  External Attachment:    Type:   Image     Comment:   External Document

## 2010-10-07 NOTE — Assessment & Plan Note (Signed)
Summary: f/u from 12/2- cbg's, lab appt per dr pokharel/pcp-pokharel/ hla   Vital Signs:  Patient profile:   65 year old female Height:      69.4 inches (176.28 cm) Weight:      205.8 pounds (93.55 kg) BMI:     30.15 Temp:     98.2 degrees F (36.78 degrees C) oral Pulse rate:   75 / minute BP sitting:   128 / 79  (right arm) Cuff size:   REGLAR  Vitals Entered By: Theotis Barrio NT II (September 11, 2009 9:18 AM) CC: ULTRA SOUND RESULTS  / LABS Is Patient Diabetic? Yes Did you bring your meter with you today? Yes Pain Assessment Patient in pain? no      Nutritional Status BMI of > 30 = obese  Have you ever been in a relationship where you felt threatened, hurt or afraid?No   Does patient need assistance? Functional Status Self care Ambulation Normal Comments LAB WORK  / ULTRA SOUND RESULTS /    Primary Care Provider:  Carlus Pavlov MD  CC:  ULTRA SOUND RESULTS  / LABS.  History of Present Illness: 65 yo woman with PMH as listed below who presents mainly for DM followup. She was last seen by her PCP, Dr. Aleene Davidson, 08-07-2009:   DM: At last visit the patient was using Lantus 25U in the morning and metformin 1000BID and was told to check her CBG and return to cliic today to possible adjustment of her regimen. She brings meter today. She checks 2-3 times a day. Usually in the AM before eating and after eating at dinner. They are very high, and the meter shows 200s, 300s, some in 400s. A1c today is 11.3.  The patient does not feel comfortable checking her own blood glucose and can't see well enough to draw up insulin on her own. Her daughter does this for her. Her daughter feels that she can give insulin before meals and check before meals as well. However, she is in the donut hole and says that Lantus is getting too expensive and that adding another expensive insulin may not be a possibility at this time.   HTN: At last visit the patient's BP was on the soft side and her  lisinopril was decreased from 10mg  to 5mg /day.   Goiter: Patient has an ultrasound 08/2009. Showed essentially stable exam with enlargement of one area. Routine fu was recommended. The patient wanted to go over this result today as she did not receive a call to inform her of these results.    *Note at last visit urine microalb/creatinine, Cmet, Lipid panel were ordered but not collected and were collected before I saw the patient this visit.    Preventive Screening-Counseling & Management  Alcohol-Tobacco     Alcohol drinks/day: 0     Smoking Status: quit     Year Quit: 4 years ago  Caffeine-Diet-Exercise     Does Patient Exercise: yes     Type of exercise: WALKING / STAIRS     Times/week: 7  Problems Prior to Update: 1)  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) 2)  Postmenopausal Status  (ICD-627.2) 3)  Chest Pain  (ICD-786.50) 4)  Aftercare, Long-term Use, Medications Nec  (ICD-V58.69) 5)  Skin Rash  (ICD-782.1) 6)  Tachycardia  (ICD-785.0) 7)  Cyst, Ovarian Nec/nos  (ICD-620.2) 8)  Fibroids, Uterus  (ICD-218.9) 9)  Deep Venous Thrombophlebitis, Leg, Right, Hx of  (ICD-V12.52) 10)  Mitral Valve Prolapse, Hx of  (ICD-V12.50) 11)  Neop,  Malignant, Female Breast, 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 * Penicillin  Review of Systems  The patient denies anorexia, fever, weight loss, weight gain, vision loss, decreased hearing, hoarseness, chest pain, syncope, dyspnea on exertion, peripheral edema, prolonged cough, headaches, hemoptysis, abdominal pain, melena, hematochezia, severe indigestion/heartburn, hematuria, incontinence, muscle weakness, suspicious skin lesions, transient blindness, difficulty walking,  depression, unusual weight change, and abnormal bleeding.    Physical Exam  General:  alert, well-developed, well-nourished, and well-hydrated.   Head:  normocephalic and atraumatic.   Eyes:  vision grossly intact, pupils equal, pupils round, and pupils reactive to light.   Ears:  R ear normal and L ear normal.   Nose:  no external deformity.   Lungs:  normal respiratory effort, no accessory muscle use, normal breath sounds, no crackles, and no wheezes.   Heart:  normal rate, regular rhythm, no murmur, no gallop, and no rub.   Abdomen:  soft, non-tender, and normal bowel sounds.   Neurologic:  alert & oriented X3, cranial nerves II-XII intact, and strength normal in all extremities.   Psych:  Oriented X3, memory intact for recent and remote, normally interactive, good eye contact, not anxious appearing, and not depressed appearing.     Impression & Recommendations:  Problem # 1:  DIABETES MELLITUS, TYPE II (ICD-250.00)  A1c definitely not at goal. I think she needs meal coverage, however due to expense as mentioned in HPI she cannot afford Lantus + Novolog. I brought up the idea of 70/30, ad they agree to make this change while contiuing metformin for the time being. Will start by slightly increasing total daily dose and giving 20U in AM ad 10U in PM. As listed in the instruction sheet I have given the patient some room to increase this by herself if she so desires. She will have the assistance of her daughter with this and I have told her that she can just take the 20/10 regimen and not inrease it and let us increase it at her next visit if she is most comfortable with this. I do not think that the 20/10 regimen will provide adequate glucose control and I feel it will need to be increased in the near future but I am starting slow as we are totally changing regimens.   Patient is calling to schedule diabetic eye exam today. Patient had urine microalb/creatinine sample submitted today.   The  following medications were removed from the medication list:    Lantus Solostar 100 Unit/ml Soln (Insulin glargine) ..... Inject 25 units under skin every morning Her updated medication list for this problem includes:    Aspirin 81 Mg Tbec (Aspirin) .Marland Kitchen... Take 1 tablet by mouth once a day    Metformin Hcl 1000 Mg Tabs (Metformin hcl) .Marland Kitchen... Take 1 tablet by mouth two times a day    Zestril 5 Mg Tabs (Lisinopril) .Marland Kitchen... Take 1 pill by mouth daily.    Relion 70/30 70-30 % Susp (Insulin isophane & regular) .Marland Kitchen... Please inject 20 units 30 minutes before breakfast and 10 units 30 minutes before dinner.  Orders: T-Hgb A1C (in-house) ([id number]) T-Capillary Blood Glucose (45409)  Problem # 2:  HYPERTENSION (ICD-401.9) At goal. Continue current regimen. Checking Bmet  today.  Her updated medication list for this problem includes:    Metoprolol Tartrate 50 Mg Tabs (Metoprolol tartrate) .Marland Kitchen... Take 1 tablet by mouth two times a day    Zestril 5 Mg Tabs (Lisinopril) .Marland Kitchen... Take 1 pill by mouth daily.  Problem # 3:  HYPERLIPIDEMIA (ICD-272.4) Assessment: Comment Only Patient had lipid panel ordered that was not drawn last visit. Was drawn today. LFTs also drawn today.   Her updated medication list for this problem includes:    Pravastatin Sodium 40 Mg Tabs (Pravastatin sodium) .Marland Kitchen... Take 1 tablet by mouth once a day  Problem # 4:  GOITER (ICD-240.9) Went over 08/2009 [place] result since she was never informed of result. Was essentially stable exam with interval growth in one nodule. They recommend continued screening. This will be managed by her PCP.   Problem # 5:  Preventive Health Care (ICD-V70.0) Refuses colonoscopy. Does not want any vaccines today as she thinks she is allergic to them and gets hives with vaccines. Is calling to set up her mammo appointment.   Complete Medication List: 1)  Aspirin 81 Mg Tbec (Aspirin) .... Take 1 tablet by mouth once a day 2)  Zyprexa 10 Mg Tabs (Olanzapine) ....  Take 1 tablet by mouth in am and 1.5 tablets at night 3)  Ranitidine Hcl 150 Mg Caps (Ranitidine hcl) .... Take 1 tablet by mouth once a day 4)  Metformin Hcl 1000 Mg Tabs (Metformin hcl) .... Take 1 tablet by mouth two times a day 5)  Arimidex 1 Mg Tabs (Anastrozole) .... Take 1 tablet by mouth once daily 6)  Oscal 500/200 D-3 500-200 Mg-unit Tabs (Calcium-vitamin d) .... Yake 1 tablet by mouth three times a day 7)  Pravastatin Sodium 40 Mg Tabs (Pravastatin sodium) .... Take 1 tablet by mouth once a day 8)  Metoprolol Tartrate 50 Mg Tabs (Metoprolol tartrate) .... Take 1 tablet by mouth two times a day 9)  Zestril 5 Mg Tabs (Lisinopril) .... Take 1 pill by mouth daily. 10)  Tramadol Hcl 50 Mg Tabs (Tramadol hcl) .... Take 1 tablet by mouth every 6 hours as needed for pain instead of darvocet. if pain not controlled for 2 weeks, then return to taking darvocet. 11)  Lancets Misc (Lancets) .... Use one two times a day to check your blood sugar 12)  Pen Needles 31g X 6 Mm Misc (Insulin pen needle) .... Use to inject insulin once daily 13)  Relion 70/30 70-30 % Susp (Insulin isophane & regular) .... Please inject 20 units 30 minutes before breakfast and 10 units 30 minutes before dinner. 14)  Onetouch Ultra Test Strp (Glucose blood) .... Use 1 up to 3 times a day to check your blood sugars  Process Orders Check Orders Results:     [facility]: Order checked:      -- T-Capillary Blood Glucose -- No CPT codes found (CPT: )      -- T-Capillary Blood Glucose --  NO TEST CODE (CPT: ) Tests Sent for requisitioning (September 13, 2009 9:34 AM):     09/11/2009: [facility] -- T-Capillary Blood Glucose [[id number]] (signed)   Patient Instructions: 1)  Please schedule a followup appointment in  ~2 weeks for management of your diabetes. 2)  Please STOP taking Lantus. Do not take your dose tomorrow morning.  3)  I have started 70/30 insulin at 20 units 30 minutes before breakfast  and 10 units before dinner. Please check your blood sugar twice daily,  once before eating breakfast and before taking insulin and once before eating dinner and before taking insulin. If you morning blood sugar is consistently above 200, you may increase your evening dose of insulin by 5 units to a dose of 15 units. If you evening blood sugar is consistently above 200, you may increase you morning insulin dose by 5 units to a total of 25 units.  4)  Please call the clinic immediately or go to the ED if you blood sugar is ever below 70. Please drink some orange juice if you have feeligs of low blood sugar and measure your sugar during the event.  Prescriptions: RELION 70/30 70-30 % SUSP (INSULIN ISOPHANE & REGULAR) Please inject 20 units 30 minutes before breakfast and 10 units 30 minutes before dinner.  #1 vial x 3   Entered and Authorized by:   Aris Lot MD   Signed by:   Aris Lot MD on 09/11/2009   Method used:   Print then Give to Patient   RxID:   [id number] [facility] ULTRA TEST  STRP (GLUCOSE BLOOD) use 1 up to 3 times a day to check your blood sugars  #90 x 11   Entered and Authorized by:   Aris Lot MD   Signed by:   Aris Lot MD on 09/11/2009   Method used:   Print then Give to Patient   RxID:   [id number]   Prevention & Chronic Care Immunizations   Influenza vaccine: refuses  (08/21/2008)   Influenza vaccine deferral: Refused  (09/11/2009)    Tetanus booster: Not documented   Td booster deferral: Refused  (09/11/2009)    Pneumococcal vaccine: Not documented    H. zoster vaccine: Not documented  Colorectal Screening   Hemoccult: Not documented    Colonoscopy: Not documented   Colonoscopy action/deferral: Refused  (09/11/2009)  Other Screening   Pap smear: Normal  (06/25/2006)    Mammogram: Abnormal  (07/18/2006)    DXA bone density scan: Not documented   Smoking status: quit  (09/11/2009)  Diabetes Mellitus   HgbA1C: 11.3   (09/11/2009)    Eye exam: Not documented    Foot exam: yes  (08/07/2009)   High risk foot: No  (08/07/2009)   Foot care education: Not documented    Urine microalbumin/creatinine ratio: 12.3  (12/16/2006)   Urine microalbumin action/deferral: Ordered    Diabetes flowsheet reviewed?: Yes   Progress toward A1C goal: Unchanged  Lipids   Total Cholesterol: 144  (08/21/2008)   LDL: 40  (08/21/2008)   LDL Direct: Not documented   HDL: 29  (08/21/2008)   Triglycerides: 373  (08/21/2008)    SGOT (AST): 16  (11/27/2008)   SGPT (ALT): 14  (11/27/2008)   Alkaline phosphatase: 115  (11/27/2008)   Total bilirubin: 0.8  (11/27/2008)    Lipid flowsheet reviewed?: Yes   Progress toward LDL goal: Unchanged  Hypertension   Last Blood Pressure: 128 / 79  (09/11/2009)   Serum creatinine: 0.73  (11/27/2008)   Serum potassium 4.1  (11/27/2008)    Hypertension flowsheet reviewed?: Yes   Progress toward BP goal: At goal  Self-Management Support :   Personal Goals (by the next clinic visit) :     Personal A1C goal: 7  (08/07/2009)     Personal blood pressure goal: 140/90  (08/07/2009)     Personal LDL goal: 70  (08/07/2009)    Diabetes self-management support: Written self-care plan  (09/11/2009)   Diabetes care plan printed   Last diabetes  self-management training by diabetes educator: 06/17/2009    Hypertension self-management support: Written self-care plan  (09/11/2009)   Hypertension self-care plan printed.    Lipid self-management support: Written self-care plan  (09/11/2009)   Lipid self-care plan printed.  Laboratory Results   Blood Tests   Date/Time Received: .Krystal Eaton [facility])  September 11, 2009 9:51 AM   Date/Time Reported: Krystal Eaton [facility])  September 11, 2009 9:51 AM   HGBA1C: 11.3%   (Normal Range: Non-Diabetic - 3-6%   Control Diabetic - 6-8%) CBG Fasting:: 194

## 2010-10-07 NOTE — Progress Notes (Signed)
Summary: refill/gg  Phone Note Refill Request  on March 17, 2010 3:50 PM  Refills Requested: Medication #1:  ZESTRIL 5 MG TABS take 1 pill by mouth daily.  Method Requested: Electronic Initial call taken by: Merrie Roof RN,  March 17, 2010 3:50 PM  Follow-up for Phone Call        Rx completed in Dr. Tiajuana Amass Follow-up by: Deatra Robinson MD,  March 17, 2010 4:56 PM    Prescriptions: ZESTRIL 5 MG TABS (LISINOPRIL) take 1 pill by mouth daily.  #30 x 11   Entered and Authorized by:   Deatra Robinson MD   Signed by:   Deatra Robinson MD on 03/17/2010   Method used:   Electronically to        [facility] [address] (retail)       [phone]       [place], [place]  [address]       Ph: [phone]       Fax: [phone]   RxID:   [id number]

## 2010-10-07 NOTE — Assessment & Plan Note (Signed)
Summary: CHECKUP/SB.   Vital Signs:  Patient profile:   65 year old female Height:      69.4 inches (176.28 cm) Weight:      203.5 pounds (93.55 kg) BMI:     30.15 Temp:     98.1 degrees F (36.72 degrees C) oral Pulse rate:   68 / minute BP sitting:   136 / 80  (right arm) Cuff size:   regular  Vitals Entered By: Theotis Barrio NT II (October 22, 2009 2:36 PM) CC: FOLLOW UP ON MEDICATION CHANGE (INSULIN)  , Depression Is Patient Diabetic? Yes Did you bring your meter with you today? Yes Pain Assessment Patient in pain? no      Nutritional Status BMI of > 30 = obese CBG Result 181  Have you ever been in a relationship where you felt threatened, hurt or afraid?No   Does patient need assistance? Functional Status Self care Ambulation Normal Comments FOLLLOW UP ON MEDICATION CHANGE (INSULIN)   Primary Care Provider:  Carlus Pavlov MD  CC:  FOLLOW UP ON MEDICATION CHANGE (INSULIN)   and Depression.  History of Present Illness: [name] with PMH as listed below who presents for regular fu of diabetes. She was last seen by me on 09-11-2009.    At her last visit her DM was not at goal. Prior to last visit she was using Lantus 25U in the morning and metformin 1000BID. At last visit she brough her meter and readings were 200s, 300s, some in 400s. A1c was 11.3. The patient did not feel comfortable checking her own blood glucose and can't see well enough to draw up insulin on her own. Her daughter does this for her. Her daughter feels that she can give insulin before meals and check before meals as well. However, she was in the donut hole and said that Lantus was getting too expensive and that adding another expensive insulin may not be a possibility at that time. I brought up the idea of 70/30 to give some form of meal coverage but while making it a simple regimen for someone who cannot see and is dependent on someone else to administer her insulin. She was to continue metformin  for the time being. I instructed her to start by slightly increasing total daily dose and giving 20U in AM ad 10U in PM.   At last visit patient told me she planned to call to schedule her diabetic eye exam.  She went to the eye doctor since last visit.    Made change to 70/30 a few weeks ago. Her blood glucose readings dropped from the high 300s to the 100s. The lowest was  ~102.  She says that she did have one time where she felt shaky and ate and it felt better. She says that she did not check her blood glucose during episode.   Preventive Healthcare: Refuses colonoscopy. Does not want any vaccines today as she thinks she is allergic to them and gets hives with vaccines. Up to date on mammo. Think she had pneumovax in past.    Depression History:      The patient denies a depressed mood most of the day and a diminished interest in her usual daily activities.         Preventive Screening-Counseling & Management  Alcohol-Tobacco     Alcohol drinks/day: 0     Smoking Status: quit     Year Quit: 4 years ago  Caffeine-Diet-Exercise     Does Patient  Exercise: yes     Type of exercise: WALKING / STAIRS     Times/week: 7  Allergies: 1)  ! * Penicillin  Review of Systems  The patient denies anorexia, fever, weight gain, vision loss, decreased hearing, hoarseness, chest pain, syncope, dyspnea on exertion, peripheral edema, prolonged cough, headaches, hemoptysis, abdominal pain, melena, hematochezia, severe indigestion/heartburn, hematuria, incontinence, genital sores, muscle weakness, suspicious skin lesions, transient blindness, difficulty walking, depression, unusual weight change, abnormal bleeding, and enlarged lymph nodes.    Physical Exam  General:  alert, well-developed, well-nourished, and well-hydrated.   Head:  normocephalic and atraumatic.   Eyes:  vision grossly intact, pupils equal, pupils round, and pupils reactive to light.   Ears:  R ear normal and L ear normal.     Nose:  no external deformity.   Mouth:  pharynx pink and moist.   Lungs:  normal respiratory effort, no accessory muscle use, normal breath sounds, no crackles, and no wheezes.   Heart:  normal rate, regular rhythm, no murmur, no gallop, and no rub.   Abdomen:  soft, non-tender, and normal bowel sounds.   Extremities:  trace left pedal edema and trace right pedal edema.   Neurologic:  alert & oriented X3, cranial nerves II-XII intact, and strength normal in all extremities.   Psych:  Oriented X3, memory intact for recent and remote, normally interactive, good eye contact, not anxious appearing, and not depressed appearing.     Impression & Recommendations:  Problem # 1:  DIABETES MELLITUS, TYPE II (ICD-250.00) Changing to 70/30 seems to have dramatically decreased her CGB readings from 300s to 100s. I think she needs to continue on current regimen of 20U before breakfast and 10U before dinner and let us recheck A1c at next visit. She did have one episode of shaking that resolved after she ate. I have advised her that if this happens again she needs to check her sugar, eat, and call the clinic. I have also advised that she needs to make sure she actually eats after taking her insulin as taking it and not eating could result in a low blood glucose reading. She  says she went to eye doctor since last visit.   Her updated medication list for this problem includes:    Aspirin 81 Mg Tbec (Aspirin) .Marland Kitchen... Take 1 tablet by mouth once a day    Metformin Hcl 1000 Mg Tabs (Metformin hcl) .Marland Kitchen... Take 1 tablet by mouth two times a day    Zestril 5 Mg Tabs (Lisinopril) .Marland Kitchen... Take 1 pill by mouth daily.    Relion 70/30 70-30 % Susp (Insulin isophane & regular) .Marland Kitchen... Please inject 20 units 30 minutes before breakfast and 10 units 30 minutes before dinner.  Problem # 2:  Preventive Health Care (ICD-V70.0) Refuses colonoscopy. Does not want any vaccines today as she thinks she is allergic to them and gets  hives with vaccines. Up to date on mammo. Think she had pneumovax in past.   Complete Medication List: 1)  Aspirin 81 Mg Tbec (Aspirin) .... Take 1 tablet by mouth once a day 2)  Zyprexa 10 Mg Tabs (Olanzapine) .... Take 1 tablet by mouth in am and 1.5 tablets at night 3)  Ranitidine Hcl 150 Mg Caps (Ranitidine hcl) .... Take 1 tablet by mouth once a day 4)  Metformin Hcl 1000 Mg Tabs (Metformin hcl) .... Take 1 tablet by mouth two times a day 5)  Arimidex 1 Mg Tabs (Anastrozole) .... Take 1 tablet by mouth  once daily 6)  Oscal 500/200 D-3 500-200 Mg-unit Tabs (Calcium-vitamin d) .... Yake 1 tablet by mouth three times a day 7)  Pravastatin Sodium 40 Mg Tabs (Pravastatin sodium) .... Take 1 tablet by mouth once a day 8)  Metoprolol Tartrate 50 Mg Tabs (Metoprolol tartrate) .... Take 1 tablet by mouth two times a day 9)  Zestril 5 Mg Tabs (Lisinopril) .... Take 1 pill by mouth daily. 10)  Tramadol Hcl 50 Mg Tabs (Tramadol hcl) .... Take 1 tablet by mouth every 6 hours as needed for pain instead of darvocet. if pain not controlled for 2 weeks, then return to taking darvocet. 11)  Lancets Misc (Lancets) .... Use one two times a day to check your blood sugar 12)  Pen Needles 31g X 6 Mm Misc (Insulin pen needle) .... Use to inject insulin once daily 13)  Relion 70/30 70-30 % Susp (Insulin isophane & regular) .... Please inject 20 units 30 minutes before breakfast and 10 units 30 minutes before dinner. 14)  Onetouch Ultra Test Strp (Glucose blood) .... Use 1 up to 3 times a day to check your blood sugars  Patient Instructions: 1)  Please return to clinic around the beginning of April to resume your diabetes care. 2)  Please continue to check your blood glucose 2 times a day as you have been doing and bring your meter to your appointment.  3)  Please call the clinic or seek medical attention if your blood sugar is less than 70.    Prevention & Chronic Care Immunizations   Influenza vaccine:  refuses  (08/21/2008)   Influenza vaccine deferral: Refused  (09/11/2009)    Tetanus booster: Not documented   Td booster deferral: Refused  (09/11/2009)    Pneumococcal vaccine: Not documented    H. zoster vaccine: Not documented   H. zoster vaccine deferral: Deferred  (10/22/2009)  Colorectal Screening   Hemoccult: Not documented   Hemoccult action/deferral: Deferred  (10/22/2009)    Colonoscopy: Not documented   Colonoscopy action/deferral: Refused  (10/22/2009)  Other Screening   Pap smear: Normal  (06/25/2006)   Pap smear action/deferral: Deferred  (10/22/2009)    Mammogram: BI-RADS CATEGORY 2:  Benign finding(s).^MM DIGITAL DIAGNOSTIC BILAT  (10/07/2009)    DXA bone density scan: Not documented   Smoking status: quit  (10/22/2009)  Diabetes Mellitus   HgbA1C: 11.3  (09/11/2009)    Eye exam: Not documented    Foot exam: yes  (08/07/2009)   High risk foot: No  (08/07/2009)   Foot care education: Not documented    Urine microalbumin/creatinine ratio: 33.3  (09/11/2009)   Urine microalbumin action/deferral: Ordered    Diabetes flowsheet reviewed?: Yes   Progress toward A1C goal: Unchanged  Lipids   Total Cholesterol: 143  (09/11/2009)   LDL: 73  (09/11/2009)   LDL Direct: Not documented   HDL: 29  (09/11/2009)   Triglycerides: 204  (09/11/2009)    SGOT (AST): 12  (09/11/2009)   SGPT (ALT): 10  (09/11/2009)   Alkaline phosphatase: 104  (09/11/2009)   Total bilirubin: 0.7  (09/11/2009)    Lipid flowsheet reviewed?: Yes   Progress toward LDL goal: Unchanged  Hypertension   Last Blood Pressure: 136 / 80  (10/22/2009)   Serum creatinine: 0.58  (09/11/2009)   Serum potassium 4.2  (09/11/2009)    Hypertension flowsheet reviewed?: Yes   Progress toward BP goal: Unchanged  Self-Management Support :   Personal Goals (by the next clinic visit) :  Personal A1C goal: 7  (08/07/2009)     Personal blood pressure goal: 140/90  (08/07/2009)     Personal LDL  goal: 70  (08/07/2009)    Patient will work on the following items until the next clinic visit to reach self-care goals:     Medications and monitoring: take my medicines every day, check my blood sugar, bring all of my medications to every visit, examine my feet every day  (10/22/2009)     Eating: drink diet soda or water instead of juice or soda, eat more vegetables, use fresh or frozen vegetables, eat foods that are low in salt, eat baked foods instead of fried foods, limit or avoid alcohol  (10/22/2009)     Activity: take a 30 minute walk every day  (10/22/2009)    Diabetes self-management support: Written self-care plan  (10/22/2009)   Diabetes care plan printed   Last diabetes self-management training by diabetes educator: 06/17/2009    Hypertension self-management support: Written self-care plan  (10/22/2009)   Hypertension self-care plan printed.    Lipid self-management support: Written self-care plan  (10/22/2009)   Lipid self-care plan printed.

## 2010-10-07 NOTE — Progress Notes (Signed)
Summary: Refill/gh  Phone Note Refill Request Message from:  Fax from Pharmacy on December 10, 2009 11:23 AM  Refills Requested: Medication #1:  RANITIDINE HCL 150 MG  CAPS Take 1 tablet by mouth once a day   Last Refilled: 09/20/2009  Medication #2:  METOPROLOL TARTRATE 50 MG  TABS Take 1 tablet by mouth two times a day   Last Refilled: 06/05/2009  Method Requested: Electronic Initial call taken by: Angelina Ok RN,  December 10, 2009 11:23 AM  Follow-up for Phone Call       Follow-up by: Jason Coop MD,  December 10, 2009 12:05 PM    Prescriptions: METOPROLOL TARTRATE 50 MG  TABS (METOPROLOL TARTRATE) Take 1 tablet by mouth two times a day  #180 x 1   Entered and Authorized by:   Jason Coop MD   Signed by:   Jason Coop MD on 12/10/2009   Method used:   Electronically to        [facility] [address] (retail)       [phone]       [place], [place]  [address]       Ph: [phone]       Fax: [phone]   RxID:   [id number] RANITIDINE HCL 150 MG  CAPS (RANITIDINE HCL) Take 1 tablet by mouth once a day  #31 x 2   Entered and Authorized by:   Jason Coop MD   Signed by:   Jason Coop MD on 12/10/2009   Method used:   Electronically to        [facility] [address] (retail)       [phone]       [place], [place]  [address]       Ph: [phone]       Fax: [phone]   RxID:   [id number]

## 2010-10-08 ENCOUNTER — Ambulatory Visit
Admission: RE | Admit: 2010-10-08 | Discharge: 2010-10-08 | Disposition: A | Discharge status: Home / Self Care | Payer: Self-pay | Source: Ambulatory Visit | Attending: Oncology | Admitting: Oncology

## 2010-10-08 DIAGNOSIS — Z780 Asymptomatic menopausal state: Secondary | ICD-10-CM

## 2010-10-08 DIAGNOSIS — Z0000 Encounter for general adult medical examination without abnormal findings: Secondary | ICD-10-CM

## 2010-10-08 DIAGNOSIS — Z853 Personal history of malignant neoplasm of breast: Secondary | ICD-10-CM

## 2010-11-20 ENCOUNTER — Other Ambulatory Visit: Payer: Self-pay | Admitting: Oncology

## 2010-11-20 ENCOUNTER — Encounter (HOSPITAL_BASED_OUTPATIENT_CLINIC_OR_DEPARTMENT_OTHER): Payer: Medicare Other | Admitting: Oncology

## 2010-11-20 DIAGNOSIS — C50419 Malignant neoplasm of upper-outer quadrant of unspecified female breast: Secondary | ICD-10-CM

## 2010-11-20 DIAGNOSIS — Z170 Estrogen receptor positive status [ER+]: Secondary | ICD-10-CM

## 2010-11-20 LAB — CBC WITH DIFFERENTIAL/PLATELET
BASO%: 0.4 % (ref 0.0–2.0)
LYMPH%: 44.4 % (ref 14.0–49.7)
MCHC: 32.6 g/dL (ref 31.5–36.0)
MCV: 83.4 fL (ref 79.5–101.0)
MONO%: 6.3 % (ref 0.0–14.0)
Platelets: 297 10*3/uL (ref 145–400)
RBC: 5 10*6/uL (ref 3.70–5.45)
RDW: 12.9 % (ref 11.2–14.5)
WBC: 4.7 10*3/uL (ref 3.9–10.3)

## 2010-11-21 LAB — COMPREHENSIVE METABOLIC PANEL
AST: 11 U/L (ref 0–37)
Alkaline Phosphatase: 106 U/L (ref 39–117)
Glucose, Bld: 187 mg/dL — ABNORMAL HIGH (ref 70–99)
Sodium: 141 mEq/L (ref 135–145)
Total Bilirubin: 0.8 mg/dL (ref 0.3–1.2)
Total Protein: 7.9 g/dL (ref 6.0–8.3)

## 2010-11-21 LAB — VITAMIN D 25 HYDROXY (VIT D DEFICIENCY, FRACTURES): Vit D, 25-Hydroxy: 30 ng/mL (ref 30–89)

## 2010-11-23 LAB — CBC
HCT: 41 % (ref 36.0–46.0)
Hemoglobin: 13.7 g/dL (ref 12.0–15.0)
MCH: 28.7 pg (ref 26.0–34.0)
MCHC: 33.4 g/dL (ref 30.0–36.0)
MCV: 86.1 fL (ref 78.0–100.0)
Platelets: 275 K/uL (ref 150–400)
RBC: 4.76 MIL/uL (ref 3.87–5.11)
RDW: 13.5 % (ref 11.5–15.5)
WBC: 4.3 K/uL (ref 4.0–10.5)

## 2010-11-23 LAB — BASIC METABOLIC PANEL WITH GFR
BUN: 8 mg/dL (ref 6–23)
CO2: 22 meq/L (ref 19–32)
Calcium: 9.1 mg/dL (ref 8.4–10.5)
Chloride: 104 meq/L (ref 96–112)
Creatinine, Ser: 0.47 mg/dL (ref 0.4–1.2)
GFR calc non Af Amer: >60 mL/min
Glucose, Bld: 150 mg/dL — ABNORMAL HIGH (ref 70–99)
Potassium: 4.4 meq/L (ref 3.5–5.1)
Sodium: 134 meq/L — ABNORMAL LOW (ref 135–145)

## 2010-11-23 LAB — POCT CARDIAC MARKERS
CKMB, poc: 2.2 ng/mL (ref 1.0–8.0)
Myoglobin, poc: 122 ng/mL (ref 12–200)
Troponin i, poc: <0.05 ng/mL (ref 0.00–0.09)

## 2010-11-23 LAB — DIFFERENTIAL
Basophils Absolute: 0 K/uL (ref 0.0–0.1)
Basophils Relative: 1 % (ref 0–1)
Eosinophils Absolute: 0.1 K/uL (ref 0.0–0.7)
Eosinophils Relative: 2 % (ref 0–5)
Lymphocytes Relative: 45 % (ref 12–46)
Lymphs Abs: 1.9 K/uL (ref 0.7–4.0)
Monocytes Absolute: 0.2 K/uL (ref 0.1–1.0)
Monocytes Relative: 4 % (ref 3–12)
Neutro Abs: 2.1 K/uL (ref 1.7–7.7)
Neutrophils Relative %: 49 % (ref 43–77)

## 2010-11-23 LAB — URINE CULTURE: Culture: NO GROWTH

## 2010-11-23 LAB — URINALYSIS, ROUTINE W REFLEX MICROSCOPIC
Bilirubin Urine: NEGATIVE
Hgb urine dipstick: NEGATIVE
Protein, ur: NEGATIVE mg/dL
Urobilinogen, UA: 0.2 mg/dL (ref 0.0–1.0)

## 2010-11-23 LAB — GLUCOSE, CAPILLARY: Glucose-Capillary: 181 mg/dL — ABNORMAL HIGH (ref 70–99)

## 2010-11-28 ENCOUNTER — Encounter: Payer: Medicare Other | Admitting: Oncology

## 2010-12-23 ENCOUNTER — Encounter: Payer: Self-pay | Admitting: Internal Medicine

## 2011-01-20 NOTE — Consult Note (Signed)
[name], GAVIN                  ACCOUNT NO.:  [id number]   MEDICAL RECORD NO.:  [id number]          PATIENT TYPE:  INP   LOCATION:  6524                         FACILITY:  [facility]   PHYSICIAN:  Pricilla Riffle, MD, FACCDATE OF BIRTH:  08-26-1946   DATE OF CONSULTATION:  02/18/2007  DATE OF DISCHARGE:  02/19/2007                                 CONSULTATION   IDENTIFICATION:  The patient is a 65 year old, whom we are asked to see  regarding chest pain.   HISTORY:  The patient has no known history of coronary artery disease.  She was admitted from [facility].  She notes over the past 2  weeks she has had chest pain that she points to the substernal.  It  occurs with and without activity.  She notes it improves actually with  ibuprofen, which she has been taking daily.  It comes and goes.  No  progression, but she has noted increased dyspnea on exertion.  No PND.   ALLERGIES:  Penicillin   MEDICATIONS PRIOR TO ADMISSION:  1. Lopid 600 b.i.d.  2. Aspirin 81  3. Zyprexa 10  4. Omeprazole 20  5. Glucophage 1 gram b.i.d.  6. Advil 800 t.i.d.  7. Arimidex 1 t.i.d.  8. Benadryl 25   PAST MEDICAL HISTORY:  1. Diabetes recently diagnosed.  2. Hypertension.  3. Dyslipidemia.  4. History of breast cancer, XRT, surgery, chemo, 12/07 to 03/08.  5. Schizophrenia.  6. Osteopenia.  7. History of DVT, remote.  8. GE reflux.  Symptoms are different.   PAST SURGICAL HISTORY:  Partial right mastectomy.   SOCIAL HISTORY:  The patient lives in [place].  He is widowed.  Quit  smoking 2 years ago after greater than 60 pack-year.  Does not drink.   FAMILY HISTORY:  Negative for CAD.  Mother died at age 25.  Father died  at age 60.   REVIEW OF SYSTEMS:  Notes sweats times 2 weeks, rash being evaluated as  an outpatient again in March of 2008.  Dyspnea as noted.  Fatigue.  Arthralgias.  Depression.  Otherwise, all systems reviewed negative to  the above problem except as noted  previously.   PHYSICAL EXAMINATION:  GENERAL:  The patient currently without chest  pain, blood pressure 132/82, pulse is 99, temperature is 98.9, and O2  saturation on room air is 92%.  HEENT:  Normocephalic, atraumatic.  EOMI.  PERRL.  Throat clear.  Nares  clear.  NECK:  JVP is normal.  No bruits.  LUNGS: The lungs are clear to auscultation without rales or wheezes.  CARDIAC EXAM:  Regular rate and rhythm, S1-S2.  No S3 OR S4.  A grade  1/6 systolic murmur is heard best at left sternal border.  CHEST:  Nontender.  SKIN:  Rash on the face, malar distribution, trunk, and back.  ABDOMEN:  Supple, nontender.  No hepatosplenomegaly.  EXTREMITIES:  Two-plus pulses distally.  No lower extremity edema.  Feet  warm.  NEUROLOGIC:  Alert and oriented x3.  Cranial nerves II-XII grossly  intact.  Motor exam:  Moving  all extremities.   LABORATORY DATA:  Chest x-ray:  Pending.  Twelve-lead EKG:  Sinus  rhythm, 99 beats per minute.  LVH.  ST changes consistent with strain.  Can not rule out ischemia.  No change from 02/04/2007.  Labs:  Significant for a hemoglobin of 13.9, WBC of 4.2, BUN and creatinine of  10 and 0.6, potassium of 4. CK-MB and troponin negative x1.   IMPRESSION:  The patient is a 65 year old with history of hypertension,  diabetes, recent breast cancer with  lumpectomy, GE reflux, but with 2-  week history of chest pain, also noted a 43-month history of rash  presented to clinic today with chest pain, admitted, and we are  consulted.  Pain atypical but increased dyspnea on exertion.  Agree with  ruling out MI.  EKG is nondiagnostic for ischemia, due to baseline LVH,  strain.  Agree with aspirin, Toprol, nitroglycerin p.r.n.  Agree with  CT, given CA and plus O2 saturation.  Also, we will see if there is a  pericardial effusion.  Discontinue NSAIDs.  Begin Protonix. May be GI.  If rules out, outpatient Myoview plus echo, continue lipid RX, optimize  HDL, and can follow that up  as an outpatient.  Hypertension.  Watch BP.  On beta blocker.   We will continue to follow.      Pricilla Riffle, MD, [facility]  Electronically Signed     PVR/MEDQ  D:  02/18/2007  T:  02/19/2007  Job:  [id number]

## 2011-01-20 NOTE — Assessment & Plan Note (Signed)
[facility]                            CARDIOLOGY OFFICE NOTE   MITZIE, MARLAR                         MRN:          [id number]  DATE:05/12/2007                            DOB:          [birth year]    IDENTIFICATION:  Ms. Sikkema is a woman who I saw in the hospital. She is  a 65 year old with a history of chest pain that was atypical. Since she  was discharged home and set up for a nuclear scan, this was done on July  29th, that showed normal perfusion. She also had an echocardiogram done  that showed normal LV function, LVEF of 60%. RV function was also  normal.   Since discharge, the patient denies chest pain and her breathing is  okay.   CURRENT MEDICATIONS:  By report:  1. Citracal daily.  2. Propoxyphene p.r.n.  3. Lisinopril 10 mg daily.  4. Metoprolol 50 mg b.i.d.  5. Zyprexa 10 mg q.a.m. 15 mg q.p.m.  6. Glipizide 5 mg b.i.d.  7. Metformin 1 gram b.i.d.  8. Simvastatin 40 mg daily.  9. Omeprazole 20 mg daily.  10.Aspirin 81 mg daily.  11.Arimidex daily.   PHYSICAL EXAMINATION:  GENERAL:  The patient is in no distress.  VITAL SIGNS:  Blood pressure 106/74, pulse 83, weight 211.  LUNGS:  Clear.  CARDIAC:  Regular rate and rhythm, S1, S2, no S3, no murmurs.  ABDOMEN:  Benign.   IMPRESSION:  1. Chest pain. Normal Myoview. She does have risk factors for coronary      artery disease. Note would follow up in the spring.  2. Health care maintenance. Will need to have lipids closely followed.  3. Hypertension, adequately controlled.   ADDENDUM   CT of the chest did show coronary artery atherosclerosis. Again,  confirming that she needs risk factor modification. We will set her up  to have her lipids done at her convenience when she goes to [facility]  clinic.   ADDENDUM   A 12-lead EKG sinus rhythm, 81 beats-per-minute. Trivial ST depression.  T-wave inversion, V3 through V6, 2, 3, 8, and F; 1, 2, 3, and F.  Note  this is  different from previous EKG. I do not have the nuclear EKG, but  we will refer to.     Pricilla Riffle, MD, [facility]  Electronically Signed    PVR/MedQ  DD: 05/12/2007  DT: 05/13/2007  Job #: [id number]

## 2011-01-20 NOTE — Discharge Summary (Signed)
NAMESALOME, [name]                  ACCOUNT NO.:  [id number]   MEDICAL RECORD NO.:  [id number]          PATIENT TYPE:  INP   LOCATION:  6524                         FACILITY:  [facility]   PHYSICIAN:  C. Ulyess Mort, M.D.DATE OF BIRTH:  1946/01/29   DATE OF ADMISSION:  02/18/2007  DATE OF DISCHARGE:  02/19/2007                               DISCHARGE SUMMARY   DISCHARGE DIAGNOSES:  1. Atypical chest pain with acute cardiac etiology ruled out.  2. Type 2 diabetes, poorly controlled with a last hemoglobin A1c of      11.9.  3. Hypertension.  4. History of breast cancer in 2007, status post radiation, currently      on chemotherapy with Arimidex.  5. Schizophrenia, stable.  6. Diffuse skin rash of unknown etiologies, chronic and stable.   DISCHARGE MEDICATIONS:  1. Glipizide 5 mg twice a day.  2. Aspirin 81 mg once a day.  3. Omeprazole 20 mg once a day.  4. Arimidex 1 mg once a day.  5. Zyprexa 10 mg once a day.  6. Metoprolol 50 mg twice a day.  7. Lisinopril 10 mg once a day.  8. Simvastatin 40 mg once a day.  9. Glucophage 1000 mg twice a day.  10.Os-Cal three times a day.  11.Benadryl 25 mg once every 8 hours as needed for itching.  (She was told specifically to stop taking Advil, ibuprofen and  gemfibrozil.)   DISPOSITION AND FOLLOW UP:  [name] was essentially unchanged at  discharge, still with her 3-4 episodes of chest pain a day resolving on  its own.  She has a follow up appointment with Dr. Thereasa Solo on  February 25, 2007 at 9:45 in the morning.  At this time, it should be  assessed whether she has had any resolution of her chest pain, and the  results of her adenosine Myoview can be followed up if they were  obtained.  Also, her glucose control should be assessed since her  addition of glipizide and her restarting Glucophage 1000 twice daily.  She will need a basic metabolic panel to assess her potassium and  creatinine, and she has been started on lisinopril.   She will also need  to have her blood pressure checked, as she has been started on  metoprolol b.i.d. and lisinopril together, to make sure she is not  hypotensive and to assess whether blood pressure is under adequate  control.  She is also going to follow up with [facility], where  s stress myoview will likely be done.   Consultations were obtained by [facility].  A CT angiogram of  the chest was obtained on February 18, 2007 with the finding of no PE a  small hiatal hernia and coronary artery atherosclerosis and moderate to  severe central lobular emphysema.  She also had no findings in her right  neck.   BRIEF ADMITTING HISTORY AND PHYSICAL:  [name] is a 65 year old  female with a past medical history of a questionable MI in the distal  past and hyperlipidemia with a low HDL,  borderline hypertension, poorly  controlled diabetes mellitus and a history of a right leg DVT who for  the last 2 weeks has had worsening of her chronic chest pain with  associated shortness of breath, lightheadedness, nausea and diaphoresis  that are relieved with ibuprofen and occur 3-4 times daily.  These  symptoms occur at rest and are relieved longer with ibuprofen and are  worse when she does upper body exercises.  She also notes that her heart  rate is fast and irregular for about an hour during these episodes.  She is going to follow up with   PHYSICAL EXAMINATION:  VITAL SIGNS:  Temperature 97.1, blood pressure  131/80, pulse 95, respiratory rate 24, O2 saturation 92% on room air.  GENERAL:  She was in no apparent distress.  HEAD/NECK:  No nasal erythema and no tracheal deviation, no carotid  bruits.  It appeared that her right neck was enlarged, and there was  potentially a palpable mass.  LUNGS:  Clear to auscultation bilaterally.  No crackles or wheezes.  CARDIOVASCULAR:  S1 and S2.  No murmur.  Regular rate and rhythm.  Bowel  sounds are positive.  No tenderness, rebound or  guarding.  EXTREMITIES:  She had no edema.  SKIN:  Her skin was cool and dry, but she had a scattered flat macular  rash on her extremities, abdomen and back.  NEUROLOGIC:  She was alert and oriented x3 with a nonfocal exam, and she  was pleasant and focused throughout the exam and appropriate.   LABORATORY DATA:  Sodium 133, potassium 4, chloride 99, bicarbonate 22,  BUN 10, creatinine 0.6, glucose 381, bilirubin 1.1, alkaline phosphatase  121, AST 21, ALT 19, protein 8, albumin 4, calcium 9.7, hemoglobin 13.9,  hematocrit 42.3, white blood cell count 4.2, platelets 393.  The first  set of cardiac enzymes were negative.   For a more details history and physical, please refer to the chart.   HOSPITAL COURSE PROBLEM BY PROBLEM:  1. Atypical chest pain, rule out myocardial infarction.  EKGs were      obtained showing no acute changes.  Considering her history of DVT      and the fact that she was at this point in time tachycardic and had      a little bit of rapid breathing, a CT scan with contrast was done      to rule out PE, and the results were that she did not have a PE.      Cardiac enzymes cycled negative.  Cardiology was consulted because      of the atypical nature of her pain to consider if she would perhaps      need a stress test, and cardiology did see the patient and agreed      with medical management in the interim, while an outpatient      Cardiolite plus/minus 2-D echocardiogram was scheduled.  2. Poorly controlled diabetes mellitus type 2.  Per her primary care      physician, Dr. Vanetta Mulders, we started her on glipizide 5 mg twice a      day, and while holding her metformin on the assumption that time      was possible she would need a cath, and because she needed contrast      for her CT angiogram of the chest.  She is to restart her metformin      three days after her discharge with the glipizide.  Her sugar was  poorly controlled, as was expected based on her  hemoglobin A1c,      with readings between 265 and the low 300s.  It is possible that      she may need insulin in the future, but we will see how she does as      an outpatient on the dose of glipizide in addition to her      metformin, with consideration to increase her glipizide in the near      future.  3. Hypertension.  Prior to her hospitalization, [name] had 2 visits      with a borderline elevated blood pressure.  The blood pressure      remained elevated when she came to the hospital, again only mildly      so.  That being said, the decision was still made to start her on      lisinopril, taking into account her poorly controlled diabetes and      starting her on metoprolol b.i.d. for cardiac protection, and based      on the results of her cardiac work-up, it can be determined whether      she needs to remain on her metoprolol 50 twice a day.  Otherwise,      she would likely benefit from staying on her lisinopril.  4. Hyperlipidemia.  A fasting lipid panel was obtained, and it showed      that she had an HDL of 33, which is a cardiac risk factor, an LDL      of 96 and triglycerides of 173.  Actually, that was an old fasting      lipid panel, but based on that, the decision was made to start her      on a statin, although because of its cardioprotective effects and      to stop the gemfibrozil and to keep her on one statin at a time and      to follow her up a fasting lipid panel within the next 6 months.   DISCHARGE LABORATORIES AND VITALS:  Her temperature was 98 degrees,  pulse 63, respirations 18, blood pressure 124/66, oxygen saturation 99  on 2 liters.   DISCHARGE LABORATORIES:  Troponin 0.01, CK-MB 1.5, white blood cell  count of 4.5, hemoglobin 13.4, hematocrit 40, platelets 366.  Sodium  134, potassium 3.9, chloride 101, bicarbonate 24, glucose 78, BUN 12,  creatinine 0.7, calcium 10.1.      Valetta Close, M.D.  Electronically Signed      C. Ulyess Mort, M.D.  Electronically Signed    JC/MEDQ  D:  02/24/2007  T:  02/24/2007  Job:  [id number]   cc:   Pricilla Riffle, MD, [facility]

## 2011-01-23 NOTE — Op Note (Signed)
[name], [name]                  ACCOUNT NO.:  [id number]   MEDICAL RECORD NO.:  [id number]          PATIENT TYPE:  AMB   LOCATION:  DSC                          FACILITY:  [facility]   PHYSICIAN:  Leonie Man, M.D.   DATE OF BIRTH:  March 21, 1946   DATE OF PROCEDURE:  08/12/2006  DATE OF DISCHARGE:                               OPERATIVE REPORT   PREOPERATIVE DIAGNOSIS:  Carcinoma of the right breast.   POSTOPERATIVE DIAGNOSIS:  Carcinoma of the right breast.   PROCEDURE:  Blue dye injection with left partial mastectomy and sentinel  lymph node biopsy.   SURGEON:  Leonie Man, M.D.   ASSISTANT:  OR tech.   ANESTHESIA:  General.   HISTORY AND INDICATION:  The patient is a 65 year old female with  abnormal mammogram showing a right sided breast lesion which, on biopsy,  shows an infiltrating intraductal carcinoma.  The lesion on imaging  measures approximately 1.1 cm.  The patient comes to the operating room  now for partial mastectomy to remove this lesion.  She has, prior to  coming, undergone needle localization and she had technetium dye  injection injected in the periareolar region for sentinel lymph node  identification.   DESCRIPTION OF PROCEDURE:  Following the induction of satisfactory  general anesthesia, the patient is positioned supinely and the  periareolar region around the breast is infiltrated with 4 mL of  methylene blue and this was massaged for approximately five minutes into  the lymphatics of the breast.  The breast was then prepped and draped to  be included in a sterile operative field.  An elliptical incision was  carried down around the lesion, deepened through skin and subcutaneous  tissues, raising superior and inferior flaps, also flaps going medially  and laterally so as to remove the entire full wedge of breast tissue  around the localizing needle.  This was carried down for approximately 5  cm with the lesion having been measured approximately  3 cm from the  skin.  The entire mass was removed and forwarded for pathologic  evaluation.  Touch preps on this breast lesion shows no evidence of  tumor at the margins.  The breast was then checked for hemostasis.  Additional bleeding points were treated with electrocautery and the  breast cavity was then packed.   Attention then turned to the axilla.  With the use of the NeoProbe, an  area within the axilla is localized with the highest counts.  A  transverse axillary incision is made, deepened through skin and  subcutaneous tissues, and using the NeoProbe to guide [place] to the region  of the sentinel node, the sentinel node was discovered with counts of  greater than 3000 and was colored blue. This hot blue node was dissected  free from the axilla and forwarded for pathologic evaluation.  No  additional sentinel nodes were located.  Touch preps on the sentinel  nodes showed no evidence of metastatic carcinoma.  The subcutaneous  tissues of the breast was then closed with interrupted 3-0 Vicryl  sutures and the skin was closed with  5-0 Monocryl. The axilla was  similarly closed with interrupted 3-0 Vicryl sutures and  subcutaneous tissues and skin closed with 5-0 Monocryl.  Both wounds  were then reinforced with Steri-Strips.  Sterile compressive dressings  were placed on the incisions. The anesthetic was reversed and the  patient removed from the operating room to the recovery room in stable  condition.  She tolerated the procedure well.      Leonie Man, M.D.  Electronically Signed     PB/MEDQ  D:  08/12/2006  T:  08/13/2006  Job:  [id number]

## 2011-03-17 ENCOUNTER — Ambulatory Visit (INDEPENDENT_AMBULATORY_CARE_PROVIDER_SITE_OTHER): Payer: Medicare Other | Admitting: Internal Medicine

## 2011-03-17 ENCOUNTER — Encounter: Payer: Self-pay | Admitting: Internal Medicine

## 2011-03-17 VITALS — BP 126/77 | HR 63 | Temp 97.7°F | Ht 70.0 in | Wt 208.8 lb

## 2011-03-17 DIAGNOSIS — E119 Type 2 diabetes mellitus without complications: Secondary | ICD-10-CM

## 2011-03-17 LAB — GLUCOSE, CAPILLARY: Glucose-Capillary: 211 mg/dL — ABNORMAL HIGH (ref 70–99)

## 2011-03-17 LAB — POCT GLYCOSYLATED HEMOGLOBIN (HGB A1C): Hemoglobin A1C: 11.3

## 2011-03-21 ENCOUNTER — Other Ambulatory Visit: Payer: Self-pay | Admitting: Internal Medicine

## 2011-03-21 DIAGNOSIS — G8929 Other chronic pain: Secondary | ICD-10-CM

## 2011-03-28 ENCOUNTER — Encounter: Payer: Self-pay | Admitting: Internal Medicine

## 2011-03-28 NOTE — Assessment & Plan Note (Addendum)
Uncontrolled due to lack of adherence with a treatment regimen. Risks of uncontrolled DM discussed with the patient.Patient's diet, exercise regimen, medications, side-effects, symptoms of hypoglycemia, foot care discussed. All questions answered in full. Strongly advised to adhere to her medication regimen and call with any questions. Will f/u in 4-8 weeks.

## 2011-03-28 NOTE — Progress Notes (Signed)
  Subjective:    Patient ID: [name], female    DOB: 1946-01-12, 65 y.o.   MRN: [id number]  HPI 1. Patient is here "for a check up." Patient requests a Rx for  A new glucometer. Denies any symptoms of hypoglycemia. 2. HTN. Takes her medications. Denies any side-effects.   Review of Systems  Constitutional: Negative.   Eyes: Negative for visual disturbance.  Respiratory: Negative.   Cardiovascular: Negative.   Gastrointestinal: Negative.   Neurological: Negative for dizziness.  Psychiatric/Behavioral: Negative.         Objective:   Physical Exam General: Vital signs reviewed and noted. Well-developed, well-nourished, in no acute distress; alert, appropriate and cooperative throughout examination.  Head: Normocephalic, atraumatic.  Neck: No deformities, masses, or tenderness noted.  Lungs:  Normal respiratory effort. Clear to auscultation BL without crackles or wheezes.  Heart: RRR. S1 and S2 normal without gallop, murmur, or rubs.  Abdomen:  BS normoactive. Soft, Nondistended, non-tender.  No masses or organomegaly.  Extremities: No pretibial edema.          Assessment & Plan:

## 2011-03-30 ENCOUNTER — Telehealth: Payer: Self-pay | Admitting: *Deleted

## 2011-03-30 DIAGNOSIS — E119 Type 2 diabetes mellitus without complications: Secondary | ICD-10-CM

## 2011-03-30 NOTE — Telephone Encounter (Signed)
Call from shelia, pt's daughter stating pt is dong well. CBG's have come down to the 200's and today  reading is 171. Now she needs a refill on lantus pen's.  Pharmacy [facility] on [address]. Pt is out of meds today.  Please add to med list in [facility]

## 2011-04-02 ENCOUNTER — Other Ambulatory Visit: Payer: Self-pay | Admitting: Internal Medicine

## 2011-04-02 MED ORDER — INSULIN GLARGINE 100 UNIT/ML ~~LOC~~ SOLN: 28.0000 [IU] | Freq: Every day | SUBCUTANEOUS | Status: DC

## 2011-04-02 NOTE — Telephone Encounter (Signed)
I talked with daughter and she states you told her to STOP the 70/30 and try lantus.  She was given one lantus pen to try and it has worked well. Pt has been out of insulin for 2 days.  She needs refill on lantus. Lantus 28 units daily  # [phone]

## 2011-04-02 NOTE — Telephone Encounter (Signed)
Dr. Denton Meek did give patient sample of Lantus on 03/17/11. I will send in Lantus for her since she is doing well with it.

## 2011-04-03 ENCOUNTER — Encounter: Payer: Medicare Other | Admitting: Oncology

## 2011-04-11 MED ORDER — INSULIN GLARGINE 100 UNIT/ML ~~LOC~~ SOLN: 28.0000 [IU] | Freq: Every day | SUBCUTANEOUS | Status: DC

## 2011-04-11 NOTE — Telephone Encounter (Signed)
Addended by: Denna Haggard on: 04/11/2011 12:37 AM   Modules accepted: Orders

## 2011-04-30 ENCOUNTER — Other Ambulatory Visit: Payer: Self-pay | Admitting: Internal Medicine

## 2011-04-30 DIAGNOSIS — I10 Essential (primary) hypertension: Secondary | ICD-10-CM

## 2011-04-30 DIAGNOSIS — E119 Type 2 diabetes mellitus without complications: Secondary | ICD-10-CM

## 2011-04-30 DIAGNOSIS — K219 Gastro-esophageal reflux disease without esophagitis: Secondary | ICD-10-CM

## 2011-04-30 DIAGNOSIS — E785 Hyperlipidemia, unspecified: Secondary | ICD-10-CM

## 2011-05-14 ENCOUNTER — Other Ambulatory Visit: Payer: Self-pay | Admitting: Internal Medicine

## 2011-05-14 ENCOUNTER — Encounter: Payer: Self-pay | Admitting: Internal Medicine

## 2011-05-14 ENCOUNTER — Ambulatory Visit (INDEPENDENT_AMBULATORY_CARE_PROVIDER_SITE_OTHER): Payer: Medicare Other | Admitting: Internal Medicine

## 2011-05-14 VITALS — BP 125/74 | HR 58 | Temp 97.3°F | Ht 70.0 in | Wt 208.7 lb

## 2011-05-14 DIAGNOSIS — C50119 Malignant neoplasm of central portion of unspecified female breast: Secondary | ICD-10-CM

## 2011-05-14 DIAGNOSIS — K219 Gastro-esophageal reflux disease without esophagitis: Secondary | ICD-10-CM

## 2011-05-14 DIAGNOSIS — E119 Type 2 diabetes mellitus without complications: Secondary | ICD-10-CM

## 2011-05-14 DIAGNOSIS — I10 Essential (primary) hypertension: Secondary | ICD-10-CM

## 2011-05-14 DIAGNOSIS — E785 Hyperlipidemia, unspecified: Secondary | ICD-10-CM

## 2011-05-14 DIAGNOSIS — G8929 Other chronic pain: Secondary | ICD-10-CM

## 2011-05-14 DIAGNOSIS — Z0000 Encounter for general adult medical examination without abnormal findings: Secondary | ICD-10-CM

## 2011-05-14 DIAGNOSIS — Z1211 Encounter for screening for malignant neoplasm of colon: Secondary | ICD-10-CM | POA: Insufficient documentation

## 2011-05-14 MED ORDER — GLUCOSE BLOOD VI STRP: ORAL_STRIP | Status: DC

## 2011-05-14 MED ORDER — METOPROLOL TARTRATE 50 MG PO TABS: 50.0000 mg | ORAL_TABLET | Freq: Two times a day (BID) | ORAL | Status: DC

## 2011-05-14 MED ORDER — LISINOPRIL 5 MG PO TABS: 5.0000 mg | ORAL_TABLET | Freq: Every day | ORAL | Status: DC

## 2011-05-14 MED ORDER — RANITIDINE HCL 150 MG PO CAPS: 150.0000 mg | ORAL_CAPSULE | Freq: Every evening | ORAL | Status: DC

## 2011-05-14 MED ORDER — PEN NEEDLES 31G X 6 MM MISC: Status: DC

## 2011-05-14 MED ORDER — CALCIUM CARBONATE-VITAMIN D 500-200 MG-UNIT PO TABS: 1.0000 | ORAL_TABLET | Freq: Three times a day (TID) | ORAL | Status: DC

## 2011-05-14 MED ORDER — TRAMADOL HCL 50 MG PO TABS: 50.0000 mg | ORAL_TABLET | Freq: Three times a day (TID) | ORAL | Status: DC | PRN

## 2011-05-14 MED ORDER — INSULIN GLARGINE 100 UNIT/ML ~~LOC~~ SOLN: 33.0000 [IU] | Freq: Every day | SUBCUTANEOUS | Status: DC

## 2011-05-14 MED ORDER — METFORMIN HCL 1000 MG PO TABS: 1000.0000 mg | ORAL_TABLET | Freq: Two times a day (BID) | ORAL | Status: DC

## 2011-05-14 MED ORDER — RANITIDINE HCL 150 MG PO CAPS: 150.0000 mg | ORAL_CAPSULE | Freq: Two times a day (BID) | ORAL | Status: DC

## 2011-05-14 MED ORDER — ASPIRIN 81 MG PO TABS: 81.0000 mg | ORAL_TABLET | Freq: Every day | ORAL | Status: DC

## 2011-05-14 MED ORDER — LANCETS MISC: 2.0000 [IU] | Freq: Every day | Status: DC

## 2011-05-14 MED ORDER — PRAVASTATIN SODIUM 40 MG PO TABS: 40.0000 mg | ORAL_TABLET | Freq: Every day | ORAL | Status: DC

## 2011-05-14 NOTE — Patient Instructions (Signed)
Your blood sugars have improved on your current dose of Lantus, but are still higher than we would like. -increase your Lantus dose to 33 units, once per day -it is important to give Lantus at the same time each day, since the medication is active for 24 hours  We are giving you Fecal Occult Blood Cards to check for any blood in your bowel movements -use 1 card per bowel movement, for 3 separate bowel movements -use a q-tip or tongue depressor to apply the stool to the appropriate side of the card -close the card, and mail all 3 cards for evaluation  Please return for a follow-up visit in 2 months to re-check your hemoglobin A1C.

## 2011-05-14 NOTE — Assessment & Plan Note (Signed)
Currently followed by Dr. Donnie Coffin, in remission, on daily Arimidex

## 2011-05-14 NOTE — Assessment & Plan Note (Signed)
BP well-controlled today -continue current regimen

## 2011-05-14 NOTE — Progress Notes (Signed)
HPI The patient is a 65 yo woman with a history of breast cancer (dx 2007, currently treated with anastazole, in remission), DM, HL, HTN, and GERD, presenting for a 70-month follow-up for diabetes.  The patient was seen 2 months ago, and found to have a Hb A1C of 11.3, with cbg's in the 300's-400's.  The patient was switched to Lantus 28 U.  Since then, the patient reports daily cbg's in the 200's-300's (didn't bring glucometer today), lowest value 150, and no polyuria, polydipsia, blurry vision, or alteration of consciousness.  The patient also presents for medication refills.  ROS: General: no fevers, chills, changes in weight, changes in appetite Skin: no rash HEENT: no blurry vision, hearing changes, sore throat Pulm: no dyspnea, coughing, wheezing CV: no chest pain, palpitations, shortness of breath Abd: no abdominal pain, nausea/vomiting, diarrhea/constipation GU: no dysuria, hematuria, polyuria Ext: no arthralgias, myalgias Neuro: no weakness, numbness, or tingling  Filed Vitals:   05/14/11 0943  BP: 125/74  Pulse: 58  Temp: 97.3 F (36.3 C)    PEX General: alert, cooperative, and in no apparent distress HEENT: pupils equal round and reactive to light, vision grossly intact, oropharynx clear and non-erythematous  Neck: supple, no lymphadenopathy, no JVD Lungs: clear to ascultation bilaterally, normal work of respiration, no wheezes, rales, ronchi Heart: regular rate and rhythm, no murmurs, gallops, or rubs Abdomen: soft, non-tender, non-distended, normal bowel sounds Msk: no joint edema, warmth, or erythema Extremities: 2+ DP/PT pulses bilaterally, no cyanosis, clubbing, or edema Neurologic: alert & oriented X3, cranial nerves II-XII intact, strength grossly intact, sensation intact to light touch  Assessment/Plan

## 2011-05-14 NOTE — Assessment & Plan Note (Signed)
The patient was counseled extensively on preventative health care at this visit.  The benefits of action, and the dangers of inaction, were reviewed for colon cancer screening, pneumovax, flu shot, zostavax, tdap, and routine pap smears -the patient refused colonoscopy, pneumovax, zostavax, flu shot, and tdap, saying she's heard such counseling before and is simply not interested in getting these vaccines.  She gave no further details, though her daughter notes having a reaction to what she believes was pneumovax. -the patient agreed to use take-home [facility] -the patient agreed to have a pap smear, but at a later appointment

## 2011-05-14 NOTE — Assessment & Plan Note (Signed)
The patient reports decreased CBG's since switching to Lantus 28, though her values are still above optimal range.  I considered the option of teaching the patient to titrate her own lantus dose, but given her age and the fact that her daughter manages her medications, I did not believe this would be a good option at this point. -increase Lantus to 33 U qhs -the patient's daughter reports that she does not give the medication at the same time each day.  She was counseled on the action of Lantus, and the dangers of a lack of coverage or double coverage of the medication.  She agreed to start giving the medication at the same time each day. -continue metformin

## 2011-05-15 NOTE — Progress Notes (Signed)
I have reviewed and discussed the care of this patient in detail with the resident physician including pertinent patient records, physical exam findings and data. I agree with details of this encounter.   

## 2011-06-12 LAB — GLUCOSE, CAPILLARY: Glucose-Capillary: 288 mg/dL — ABNORMAL HIGH (ref 70–99)

## 2011-06-25 LAB — COMPREHENSIVE METABOLIC PANEL
AST: 21
BUN: 10
CO2: 22
Calcium: 9.7
Chloride: 99
Creatinine, Ser: 0.62
GFR calc Af Amer: >60
GFR calc non Af Amer: >60
Total Bilirubin: 1.1

## 2011-06-25 LAB — DIFFERENTIAL
Basophils Absolute: 0
Lymphocytes Relative: 41
Lymphs Abs: 1.7
Neutro Abs: 2.1
Neutrophils Relative %: 50

## 2011-06-25 LAB — CBC
MCV: 85.5
Platelets: 366
Platelets: 393
RDW: 12.5
WBC: 4.2
WBC: 4.5

## 2011-06-25 LAB — BASIC METABOLIC PANEL
BUN: 12
Creatinine, Ser: 0.73
GFR calc non Af Amer: >60

## 2011-06-25 LAB — CARDIAC PANEL(CRET KIN+CKTOT+MB+TROPI)
CK, MB: 1.5
CK, MB: 1.5
Relative Index: INVALID
Total CK: 67
Total CK: 75
Troponin I: 0.01
Troponin I: 0.01

## 2011-06-25 LAB — HEMOGLOBIN A1C: Hgb A1c MFr Bld: 11.9 — ABNORMAL HIGH

## 2011-06-25 LAB — TROPONIN I: Troponin I: 0.01

## 2011-06-25 LAB — CK TOTAL AND CKMB (NOT AT ARMC): Relative Index: INVALID

## 2011-07-08 ENCOUNTER — Encounter: Payer: Medicare Other | Admitting: Internal Medicine

## 2011-07-09 ENCOUNTER — Ambulatory Visit (INDEPENDENT_AMBULATORY_CARE_PROVIDER_SITE_OTHER): Payer: Medicare Other | Admitting: General Surgery

## 2011-08-27 ENCOUNTER — Encounter: Payer: Medicare Other | Admitting: Internal Medicine

## 2011-09-09 ENCOUNTER — Ambulatory Visit: Payer: Self-pay | Admitting: Internal Medicine

## 2011-09-16 ENCOUNTER — Other Ambulatory Visit (HOSPITAL_BASED_OUTPATIENT_CLINIC_OR_DEPARTMENT_OTHER): Payer: Medicare Other | Admitting: Lab

## 2011-09-16 ENCOUNTER — Other Ambulatory Visit: Payer: Self-pay | Admitting: Oncology

## 2011-09-16 ENCOUNTER — Telehealth: Payer: Self-pay | Admitting: Oncology

## 2011-09-16 ENCOUNTER — Ambulatory Visit (HOSPITAL_BASED_OUTPATIENT_CLINIC_OR_DEPARTMENT_OTHER): Payer: Medicare Other | Admitting: Oncology

## 2011-09-16 VITALS — BP 155/81 | HR 76 | Temp 98.7°F | Ht 70.0 in | Wt 210.0 lb

## 2011-09-16 DIAGNOSIS — C50419 Malignant neoplasm of upper-outer quadrant of unspecified female breast: Secondary | ICD-10-CM

## 2011-09-16 DIAGNOSIS — C50119 Malignant neoplasm of central portion of unspecified female breast: Secondary | ICD-10-CM

## 2011-09-16 DIAGNOSIS — Z170 Estrogen receptor positive status [ER+]: Secondary | ICD-10-CM

## 2011-09-16 DIAGNOSIS — E559 Vitamin D deficiency, unspecified: Secondary | ICD-10-CM

## 2011-09-16 LAB — CBC WITH DIFFERENTIAL/PLATELET
BASO%: 0.2 % (ref 0.0–2.0)
EOS%: 2.6 % (ref 0.0–7.0)
HCT: 39.8 % (ref 34.8–46.6)
MCH: 28.8 pg (ref 25.1–34.0)
MCHC: 33.2 g/dL (ref 31.5–36.0)
MCV: 86.8 fL (ref 79.5–101.0)
MONO%: 5.6 % (ref 0.0–14.0)
NEUT%: 47.7 % (ref 38.4–76.8)
RDW: 13.4 % (ref 11.2–14.5)
lymph#: 2.4 10*3/uL (ref 0.9–3.3)

## 2011-09-16 LAB — COMPREHENSIVE METABOLIC PANEL
AST: 15 U/L (ref 0–37)
Albumin: 4.5 g/dL (ref 3.5–5.2)
Alkaline Phosphatase: 79 U/L (ref 39–117)
BUN: 12 mg/dL (ref 6–23)
Potassium: 4.1 mEq/L (ref 3.5–5.3)
Total Bilirubin: 0.6 mg/dL (ref 0.3–1.2)

## 2011-09-16 LAB — VITAMIN D 25 HYDROXY (VIT D DEFICIENCY, FRACTURES): Vit D, 25-Hydroxy: 26 ng/mL — ABNORMAL LOW (ref 30–89)

## 2011-09-16 NOTE — Telephone Encounter (Signed)
Gv pt appt for jan2014.  scheduled pt for mammogram on 10/12/2011  @ 9am @ [facility]

## 2011-10-13 ENCOUNTER — Other Ambulatory Visit: Payer: Self-pay | Admitting: Oncology

## 2011-10-13 DIAGNOSIS — C50419 Malignant neoplasm of upper-outer quadrant of unspecified female breast: Secondary | ICD-10-CM

## 2011-11-16 ENCOUNTER — Ambulatory Visit
Admission: RE | Admit: 2011-11-16 | Discharge: 2011-11-16 | Disposition: A | Discharge status: Home / Self Care | Payer: Medicare Other | Source: Ambulatory Visit | Attending: Oncology | Admitting: Oncology

## 2011-11-16 ENCOUNTER — Other Ambulatory Visit: Payer: Self-pay | Admitting: Oncology

## 2011-11-16 DIAGNOSIS — E559 Vitamin D deficiency, unspecified: Secondary | ICD-10-CM

## 2011-11-16 DIAGNOSIS — C50119 Malignant neoplasm of central portion of unspecified female breast: Secondary | ICD-10-CM

## 2011-11-17 ENCOUNTER — Ambulatory Visit (INDEPENDENT_AMBULATORY_CARE_PROVIDER_SITE_OTHER): Payer: Medicare Other | Admitting: Internal Medicine

## 2011-11-17 ENCOUNTER — Encounter: Payer: Self-pay | Admitting: Internal Medicine

## 2011-11-17 VITALS — BP 158/85 | HR 70 | Temp 97.7°F | Ht 70.0 in | Wt 206.0 lb

## 2011-11-17 DIAGNOSIS — I2510 Atherosclerotic heart disease of native coronary artery without angina pectoris: Secondary | ICD-10-CM

## 2011-11-17 DIAGNOSIS — Z79899 Other long term (current) drug therapy: Secondary | ICD-10-CM

## 2011-11-17 DIAGNOSIS — E119 Type 2 diabetes mellitus without complications: Secondary | ICD-10-CM

## 2011-11-17 DIAGNOSIS — I10 Essential (primary) hypertension: Secondary | ICD-10-CM

## 2011-11-17 LAB — GLUCOSE, CAPILLARY: Glucose-Capillary: 98 mg/dL (ref 70–99)

## 2011-11-17 LAB — POCT GLYCOSYLATED HEMOGLOBIN (HGB A1C): Hemoglobin A1C: 6.7

## 2011-11-17 MED ORDER — METOPROLOL TARTRATE 50 MG PO TABS: 100.0000 mg | ORAL_TABLET | Freq: Two times a day (BID) | ORAL | Status: DC

## 2011-11-17 NOTE — Patient Instructions (Signed)
Please, note a change in the dose of Metoproliol. Please, call with any quustions. Happy Odis Luster!!!! Please, follow up in 6 months.

## 2011-11-17 NOTE — Progress Notes (Signed)
Patient ID: [name], female   DOB: Jan 07, 1946, 66 y.o.   MRN: [id number]  Subjective:   Patient ID: [name] female   DOB: [birth year] 66 y.o.   MRN: [id number]  HPI: [name] is a 66 y.o. AA woman is here for a follow up: 1. DM, type 2. Take all her meds w/o any side-effects. Denies any concerns. 2. HTN. Denies any HA/visual changes/weakness, SOB or CP.    Past Medical History  Diagnosis Date  . Diabetes mellitus   . GERD (gastroesophageal reflux disease)   . Hyperlipidemia   . Hypertension   . Schizophrenia   . Atypical nevus   . History of mitral valve prolapse   . Breast cancer 07/14/06    Left breast. Ductal carcinoma, grade 3. Followed by Dr. Donnie Coffin. s/p lumpectomy with radiation  . Goiter     U/S 08/2009-persistent multiple small nodules, one in the right lower pole slightly enlarged, recommend continued followup.  . Ovarian cyst   . Fibroids   . DVT (deep vein thrombosis) in pregnancy 1982    Affected the RLE. Required coumadin.  . Dental caries    Current Outpatient Prescriptions  Medication Sig Dispense Refill  . anastrozole (ARIMIDEX) 1 MG tablet TAKE 1 TABLET BY MOUTH ONCE A DAY  30 tablet  12  . aspirin 81 MG tablet Take 1 tablet (81 mg total) by mouth daily.  30 tablet  11  . calcium-vitamin D (OSCAL 500/200 D-3) 500-200 MG-UNIT per tablet Take 1 tablet by mouth 3 (three) times daily.  30 tablet  11  . glucose blood test strip Use 1-3 times a day to check your blood sugar  100 each  11  . insulin glargine (LANTUS) 100 UNIT/ML injection Inject 33 Units into the skin daily.      . Insulin Pen Needle (PEN NEEDLES) 31G X 6 MM MISC Use to inject insulin once daily  100 each  11  . Lancets MISC 2 Units by Does not apply route daily. Use 1-2 times a day to check your blood sugar  100 each  11  . lisinopril (PRINIVIL,ZESTRIL) 5 MG tablet Take 1 tablet (5 mg total) by mouth daily.  30 tablet  11  . metFORMIN (GLUCOPHAGE) 1000 MG tablet Take 1 tablet (1,000 mg  total) by mouth 2 (two) times daily with a meal.  60 tablet  11  . metoprolol (LOPRESSOR) 50 MG tablet Take 2 tablets (100 mg total) by mouth 2 (two) times daily.  120 tablet  11  . OLANZapine (ZYPREXA) 10 MG tablet Take 1 tablet by mouth in the morning and 1 tablet in the evening      . pravastatin (PRAVACHOL) 40 MG tablet Take 1 tablet (40 mg total) by mouth daily.  30 tablet  11  . ranitidine (ZANTAC) 150 MG capsule Take 1 capsule (150 mg total) by mouth 2 (two) times daily.  30 capsule  11  . traMADol (ULTRAM) 50 MG tablet Take 1 tablet (50 mg total) by mouth every 8 (eight) hours as needed for pain.  60 tablet  8  . DISCONTD: metoprolol (LOPRESSOR) 50 MG tablet Take 1 tablet (50 mg total) by mouth 2 (two) times daily.  60 tablet  11  . DISCONTD: metoprolol (LOPRESSOR) 50 MG tablet Take 2 tablets (100 mg total) by mouth 2 (two) times daily.  120 tablet  11   Family History  Problem Relation Age of Onset  . Diabetes Mother   .  Diabetes Father    History   Social History  . Marital Status: Widowed    Spouse Name: N/A    Number of Children: N/A  . Years of Education: N/A   Social History Main Topics  . Smoking status: Former Games developer  . Smokeless tobacco: None   Comment: quit 9 yrs ago.  . Alcohol Use: No  . Drug Use: None  . Sexually Active: None   Other Topics Concern  . None   Social History Narrative   Widow.Used to work as a Lawyer, now retired.   Review of Systems: Constitutional: Denies fever, chills, diaphoresis, appetite change and fatigue.  HEENT: Denies photophobia, eye pain, redness, hearing loss, ear pain, congestion, sore throat, rhinorrhea, sneezing, mouth sores, trouble swallowing, neck pain, neck stiffness and tinnitus.   Respiratory: Denies SOB, DOE, cough, chest tightness,  and wheezing.   Cardiovascular: Denies chest pain, palpitations and leg swelling.  Gastrointestinal: Denies nausea, vomiting, abdominal pain, diarrhea, constipation, blood in stool and  abdominal distention.  Genitourinary: Denies dysuria, urgency, frequency, hematuria, flank pain and difficulty urinating.  Musculoskeletal: Denies myalgias, back pain, joint swelling, arthralgias and gait problem.  Skin: Denies pallor, rash and wound.  Neurological: Denies dizziness, seizures, syncope, weakness, light-headedness, numbness and headaches.  Hematological: Denies adenopathy. Easy bruising, personal or family bleeding history  Psychiatric/Behavioral: Denies suicidal ideation, mood changes, confusion, nervousness, sleep disturbance and agitation  Objective:  Physical Exam: Filed Vitals:   11/17/11 1323  BP: 158/85  Pulse: 70  Temp: 97.7 F (36.5 C)  TempSrc: Oral  Height: 5\' 10"  (1.778 m)  Weight: 206 lb (93.441 kg)   Constitutional: Vital signs reviewed.  Patient is a well-developed and well-nourished woman no acute distress and cooperative with exam. Alert and oriented x3.  Head: Normocephalic and atraumatic Ear: TM normal bilaterally Mouth: no erythema or exudates, MMM Eyes: PERRL, EOMI, conjunctivae normal, No scleral icterus.  Neck: Supple, Trachea midline normal ROM, No JVD, mass, thyromegaly, or carotid bruit present.  Cardiovascular: RRR, S1 normal, S2 normal, no MRG, pulses symmetric and intact bilaterally Pulmonary/Chest: CTAB, no wheezes, rales, or rhonchi Abdominal: Soft. Non-tender, non-distended, bowel sounds are normal, no masses, organomegaly, or guarding present.  GU: no CVA tenderness Musculoskeletal: No joint deformities, erythema, or stiffness, ROM full and no nontender Hematology: no cervical, inginal, or axillary adenopathy.  Neurological: A&O x3, Strenght is normal and symmetric bilaterally, cranial nerve II-XII are grossly intact, no focal motor deficit, sensory intact to light touch bilaterally.  Skin: Warm, dry and intact. No rash, cyanosis, or clubbing.  Psychiatric: Normal mood and affect. speech and behavior is normal. Judgment and thought  content normal. Cognition and memory are normal.   Assessment & Plan:    1.HTN/Hx of CAD. -needs better BP and beta blockade -> increase Metoprolol to 100 mg PO bid -low salt diet -exercise -Lipid panel today  2. DM, type 2. -Well controlled. No signs of Hypoglycemia. -Continue with current regimen of Lantus 33 U sq qhs and metformin -foot care reviewed -hgbA1c and urine microalbumin today. -refuses Pneumovax and flu vaccines

## 2011-11-18 LAB — LIPID PANEL
Cholesterol: 104 mg/dL (ref 0–200)
HDL: 26 mg/dL — ABNORMAL LOW (ref >39)
Total CHOL/HDL Ratio: 4 Ratio
Triglycerides: 167 mg/dL — ABNORMAL HIGH (ref <150)
VLDL: 33 mg/dL (ref 0–40)

## 2011-11-18 LAB — MICROALBUMIN / CREATININE URINE RATIO: Microalb Creat Ratio: 8 mg/g (ref 0.0–30.0)

## 2012-06-01 ENCOUNTER — Other Ambulatory Visit: Payer: Self-pay | Admitting: *Deleted

## 2012-06-01 DIAGNOSIS — I10 Essential (primary) hypertension: Secondary | ICD-10-CM

## 2012-06-01 DIAGNOSIS — G8929 Other chronic pain: Secondary | ICD-10-CM

## 2012-06-01 MED ORDER — LISINOPRIL 5 MG PO TABS: 5.0000 mg | ORAL_TABLET | Freq: Every day | ORAL | Status: DC

## 2012-06-01 MED ORDER — TRAMADOL HCL 50 MG PO TABS: 50.0000 mg | ORAL_TABLET | Freq: Three times a day (TID) | ORAL | Status: DC | PRN

## 2012-06-02 ENCOUNTER — Other Ambulatory Visit: Payer: Self-pay | Admitting: *Deleted

## 2012-06-02 DIAGNOSIS — E785 Hyperlipidemia, unspecified: Secondary | ICD-10-CM

## 2012-06-02 DIAGNOSIS — K219 Gastro-esophageal reflux disease without esophagitis: Secondary | ICD-10-CM

## 2012-06-02 DIAGNOSIS — E119 Type 2 diabetes mellitus without complications: Secondary | ICD-10-CM

## 2012-06-02 MED ORDER — RANITIDINE HCL 150 MG PO CAPS: 150.0000 mg | ORAL_CAPSULE | Freq: Two times a day (BID) | ORAL | Status: DC

## 2012-06-02 MED ORDER — PRAVASTATIN SODIUM 40 MG PO TABS: 40.0000 mg | ORAL_TABLET | Freq: Every day | ORAL | Status: DC

## 2012-06-02 MED ORDER — METFORMIN HCL 1000 MG PO TABS: 1000.0000 mg | ORAL_TABLET | Freq: Two times a day (BID) | ORAL | Status: DC

## 2012-06-13 ENCOUNTER — Other Ambulatory Visit: Payer: Self-pay | Admitting: *Deleted

## 2012-06-13 DIAGNOSIS — E559 Vitamin D deficiency, unspecified: Secondary | ICD-10-CM

## 2012-06-13 DIAGNOSIS — C50119 Malignant neoplasm of central portion of unspecified female breast: Secondary | ICD-10-CM

## 2012-06-14 MED ORDER — INSULIN GLARGINE 100 UNIT/ML ~~LOC~~ SOLN: 33.0000 [IU] | Freq: Every day | SUBCUTANEOUS | Status: DC

## 2012-09-12 ENCOUNTER — Encounter: Payer: Self-pay | Admitting: Internal Medicine

## 2012-09-12 ENCOUNTER — Ambulatory Visit (INDEPENDENT_AMBULATORY_CARE_PROVIDER_SITE_OTHER): Payer: Medicare Other | Admitting: Internal Medicine

## 2012-09-12 VITALS — BP 141/84 | HR 90 | Temp 98.7°F | Ht 69.5 in | Wt 192.6 lb

## 2012-09-12 DIAGNOSIS — E119 Type 2 diabetes mellitus without complications: Secondary | ICD-10-CM

## 2012-09-12 LAB — GLUCOSE, CAPILLARY: Glucose-Capillary: 172 mg/dL — ABNORMAL HIGH (ref 70–99)

## 2012-09-12 NOTE — Progress Notes (Signed)
Subjective:   Patient ID: [name] female   DOB: 02/27/1946 67 y.o.   MRN: [id number]  HPI: [name] is a 67 y.o. AA woman pmh of DM type 2 well controlled, HTN, HLD, schizophrenia and remote hx of breast cancer here for DM follow up. Pt has been doing well and is compliant with medications. Recently has had some dietary indiscretions for holidays but CBGs have been in 100-200s. Pt has has myalgias, cough, and rhinorrhea for past couple of days but denied any wheezing, CP, sputum, n/v/d, or weight loss. Pt may have had some sick contacts and is taking benadryl OTC for symptoms with some improvement.    Past Medical History  Diagnosis Date  . Diabetes mellitus   . GERD (gastroesophageal reflux disease)   . Hyperlipidemia   . Hypertension   . Schizophrenia   . Atypical nevus   . History of mitral valve prolapse   . Breast cancer 07/14/06    Left breast. Ductal carcinoma, grade 3. Followed by Dr. Donnie Coffin. s/p lumpectomy with radiation  . Goiter     U/S 08/2009-persistent multiple small nodules, one in the right lower pole slightly enlarged, recommend continued followup.  . Ovarian cyst   . Fibroids   . DVT (deep vein thrombosis) in pregnancy 1982    Affected the RLE. Required coumadin.  . Dental caries    Current Outpatient Prescriptions  Medication Sig Dispense Refill  . anastrozole (ARIMIDEX) 1 MG tablet TAKE 1 TABLET BY MOUTH ONCE A DAY  30 tablet  12  . aspirin 81 MG tablet Take 1 tablet (81 mg total) by mouth daily.  30 tablet  11  . calcium-vitamin D (OSCAL 500/200 D-3) 500-200 MG-UNIT per tablet Take 1 tablet by mouth 3 (three) times daily.  30 tablet  11  . glucose blood test strip Use 1-3 times a day to check your blood sugar  100 each  11  . insulin glargine (LANTUS) 100 UNIT/ML injection Inject 33 Units into the skin daily.  3 mL  3  . Insulin Pen Needle (PEN NEEDLES) 31G X 6 MM MISC Use to inject insulin once daily  100 each  11  . Lancets MISC 2 Units by Does not  apply route daily. Use 1-2 times a day to check your blood sugar  100 each  11  . lisinopril (PRINIVIL,ZESTRIL) 5 MG tablet Take 1 tablet (5 mg total) by mouth daily.  90 tablet  3  . metFORMIN (GLUCOPHAGE) 1000 MG tablet Take 1 tablet (1,000 mg total) by mouth 2 (two) times daily with a meal.  60 tablet  11  . metoprolol (LOPRESSOR) 50 MG tablet Take 2 tablets (100 mg total) by mouth 2 (two) times daily.  120 tablet  11  . OLANZapine (ZYPREXA) 10 MG tablet Take 1 tablet by mouth in the morning and 1 tablet in the evening      . pravastatin (PRAVACHOL) 40 MG tablet Take 1 tablet (40 mg total) by mouth daily.  30 tablet  11  . ranitidine (ZANTAC) 150 MG capsule Take 1 capsule (150 mg total) by mouth 2 (two) times daily.  30 capsule  11  . traMADol (ULTRAM) 50 MG tablet Take 1 tablet (50 mg total) by mouth every 8 (eight) hours as needed for pain.  60 tablet  11   Family History  Problem Relation Age of Onset  . Diabetes Mother   . Diabetes Father    History   Social History  .  Marital Status: Widowed    Spouse Name: N/A    Number of Children: N/A  . Years of Education: N/A   Social History Main Topics  . Smoking status: Former Games developer  . Smokeless tobacco: None     Comment: quit 9 yrs ago.  . Alcohol Use: No  . Drug Use: None  . Sexually Active: None   Other Topics Concern  . None   Social History Narrative   Widow.Used to work as a Lawyer, now retired.   Review of Systems: otherwise negative unless listed in HPI  Objective:  Physical Exam: Filed Vitals:   09/12/12 0946 09/12/12 1005  BP: 150/82 141/84  Pulse: 86 90  Temp: 98.7 F (37.1 C)   TempSrc: Oral   Height: 5' 9.5" (1.765 m)   Weight: 192 lb 9.6 oz (87.363 kg)   SpO2: 96%    General: sitting in chair, comfortable, NAD HEENT: PERRL, EOMI, no scleral icterus, clear PND, MMM, non-bulging non-erythematous TMs,  Cardiac: RRR, no rubs, murmurs or gallops Pulm: clear to auscultation bilaterally, moving normal volumes  of air Abd: soft, nontender, nondistended, BS present Ext: warm and well perfused, no pedal edema Neuro: alert and oriented X3, cranial nerves II-XII grossly intact  Assessment & Plan:  1. DM type 2 well controlled: Pt HgbA1c was previously 6.7 and 7.6 today. In light of holiday and current illness not concerning. Pt refused flu shot and LDL was 45 3/13.  -cont lantus 33U qhs -cont metformin 1000mg  BID -cont pravastatin  2. HTN: Pt was remotely very well controlled in 120s/80s and today 140/80 pt still at goal but has some higher readings 150s/80s. Pt only on lisinopril 5mg  and metoprolol 100mg  BID. There is no hx of tachycardia but pt states that up titration of lisinopril caused symptomatic hypotension.  -suggested added HCTZ 12.5mg  and pt would like to discuss at next visit -cont lisinopril 5mg  and metoprolol 100 mg BID -f/u in 6 mon  Pt discussed with Dr. Rogelia Boga

## 2012-09-15 ENCOUNTER — Telehealth: Payer: Self-pay | Admitting: Oncology

## 2012-09-15 ENCOUNTER — Ambulatory Visit: Payer: Medicare Other | Admitting: Oncology

## 2012-09-15 ENCOUNTER — Other Ambulatory Visit: Payer: Medicare Other | Admitting: Lab

## 2012-09-15 NOTE — Telephone Encounter (Signed)
Pt came in today for appt w/PR. Per pt/family she received no call and no letter. Informed pt of PR leaving and that she would be contacted w/new doctor and appt. Pt/family was very understanding and will call if she has any problems between now and being contacted w/new appt.

## 2012-09-26 DIAGNOSIS — Z79899 Other long term (current) drug therapy: Secondary | ICD-10-CM | POA: Diagnosis not present

## 2012-10-03 DIAGNOSIS — G609 Hereditary and idiopathic neuropathy, unspecified: Secondary | ICD-10-CM | POA: Diagnosis not present

## 2012-10-03 DIAGNOSIS — E538 Deficiency of other specified B group vitamins: Secondary | ICD-10-CM | POA: Diagnosis not present

## 2012-10-03 DIAGNOSIS — F411 Generalized anxiety disorder: Secondary | ICD-10-CM | POA: Diagnosis not present

## 2012-10-04 NOTE — Progress Notes (Signed)
Hematology and Oncology Follow Up Visit  [name] [id number] 20-Dec-1945 67 y.o.   DIAGNOSIS:   Encounter Diagnoses  Name Primary?  . NEOP, MALIGNANT, FEMALE BREAST, CENTRAL Yes  . Unspecified vitamin D deficiency   T1C NO, er/pr+ breast cancer s/p lumpectomy  08/12/06, followed by xrt on arimidex. Hx of type 2 DM Hx of hypertension  PAST THERAPY:  As above   Interim History:  She is doing well from a cancer point of view. She is tolerating arimidex fairly well. She denies specific problems with this.she is due for a mammogram in 3/13. She is taking vitamin d supplements.  Medications: I have reviewed the patient's current medications.  Allergies:  Allergies  Allergen Reactions  . Penicillins     Past Medical History, Surgical history, Social history, and Family History were reviewed and updated.  Review of Systems: Constitutional:  Negative for fever, chills, night sweats, anorexia, weight loss, pain. Cardiovascular: negative Respiratory: negative Neurological: negative Dermatological: negative ENT: negative Skin Gastrointestinal: no abdominal pain, change in bowel habits, or black or bloody stools Genito-Urinary: negative Hematological and Lymphatic: negative Breast: negative Musculoskeletal: negative Remaining ROS negative.  Physical Exam:  Blood pressure 155/81, pulse 76, temperature 98.7 F (37.1 C), temperature source Oral, height 5\' 10"  (1.778 m), weight 210 lb (95.255 kg).  ECOG: 0  HEENT:  Sclerae anicteric, conjunctivae pink.  Oropharynx clear.  No mucositis or candidiasis.  Nodes:  No cervical, supraclavicular, or axillary lymphadenopathy palpated.  Breast Exam:  Right breast is benign.  No masses, discharge, skin change, or nipple inversion.  Left breast is benign.  No masses, discharge, skin change, or nipple inversion..  Lungs:  Clear to auscultation bilaterally.  No crackles, rhonchi, or wheezes.  Heart:  Regular rate and rhythm.  Abdomen:  Soft,  nontender.  Positive bowel sounds.  No organomegaly or masses palpated.  Musculoskeletal:  No focal spinal tenderness to palpation.  Extremities:  Benign.  No peripheral edema or cyanosis.  Skin:  Benign.  Neuro:  Nonfocal.     Lab Results: Lab Results  Component Value Date   WBC 5.4 09/16/2011   HGB 13.2 09/16/2011   HCT 39.8 09/16/2011   MCV 86.8 09/16/2011   PLT 330 09/16/2011     Chemistry      Component Value Date/Time   NA 138 09/16/2011 0847   NA 138 09/16/2011 0847   NA 138 09/16/2011 0847   K 4.1 09/16/2011 0847   K 4.1 09/16/2011 0847   K 4.1 09/16/2011 0847   CL 102 09/16/2011 0847   CL 102 09/16/2011 0847   CL 102 09/16/2011 0847   CO2 24 09/16/2011 0847   CO2 24 09/16/2011 0847   CO2 24 09/16/2011 0847   BUN 12 09/16/2011 0847   BUN 12 09/16/2011 0847   BUN 12 09/16/2011 0847   CREATININE 0.61 09/16/2011 0847   CREATININE 0.61 09/16/2011 0847   CREATININE 0.61 09/16/2011 0847      Component Value Date/Time   CALCIUM 9.6 09/16/2011 0847   CALCIUM 9.6 09/16/2011 0847   CALCIUM 9.6 09/16/2011 0847   ALKPHOS 79 09/16/2011 0847   ALKPHOS 79 09/16/2011 0847   ALKPHOS 79 09/16/2011 0847   AST 15 09/16/2011 0847   AST 15 09/16/2011 0847   AST 15 09/16/2011 0847   ALT 13 09/16/2011 0847   ALT 13 09/16/2011 0847   ALT 13 09/16/2011 0847   BILITOT 0.6 09/16/2011 0847   BILITOT 0.6 09/16/2011 0847   BILITOT  0.6 09/16/2011 0847       Radiological Studies:  No results found.   IMPRESSIONS AND PLAN: A 67 y.o. female with   A history of node negative er/pr+ breast cancer, s/.p surgery, xrt on ongoing AI therapy. She is doing well and will be seen in 1 yr, with appropriate imaging studies.  Spent more than half the time coordinating care, as well as discussion of BMI and its implications.      Truman Aceituno 1/28/20141:31 AM Cell [phone]

## 2012-10-10 ENCOUNTER — Encounter: Payer: Self-pay | Admitting: Oncology

## 2012-10-10 ENCOUNTER — Telehealth: Payer: Self-pay | Admitting: *Deleted

## 2012-10-10 NOTE — Telephone Encounter (Signed)
Confirmed new f/u appt date and time for 10/17/12 at 10:00am with Norina Buzzard, NP.

## 2012-10-13 ENCOUNTER — Other Ambulatory Visit: Payer: Self-pay | Admitting: Oncology

## 2012-10-13 DIAGNOSIS — Z9889 Other specified postprocedural states: Secondary | ICD-10-CM

## 2012-10-13 DIAGNOSIS — Z853 Personal history of malignant neoplasm of breast: Secondary | ICD-10-CM

## 2012-10-17 ENCOUNTER — Ambulatory Visit: Payer: Medicare Other | Admitting: Family

## 2012-10-19 ENCOUNTER — Telehealth: Payer: Self-pay | Admitting: *Deleted

## 2012-10-19 NOTE — Telephone Encounter (Signed)
Left message for pt to return my call so I can reschedule her missed appt.

## 2012-10-24 DIAGNOSIS — N6459 Other signs and symptoms in breast: Secondary | ICD-10-CM | POA: Diagnosis not present

## 2012-10-27 DIAGNOSIS — R928 Other abnormal and inconclusive findings on diagnostic imaging of breast: Secondary | ICD-10-CM | POA: Diagnosis not present

## 2012-10-27 DIAGNOSIS — Z0189 Encounter for other specified special examinations: Secondary | ICD-10-CM | POA: Diagnosis not present

## 2012-10-27 DIAGNOSIS — C50919 Malignant neoplasm of unspecified site of unspecified female breast: Secondary | ICD-10-CM | POA: Diagnosis not present

## 2012-10-28 ENCOUNTER — Other Ambulatory Visit: Payer: Self-pay | Admitting: Radiology

## 2012-10-28 DIAGNOSIS — C50919 Malignant neoplasm of unspecified site of unspecified female breast: Secondary | ICD-10-CM

## 2012-10-31 ENCOUNTER — Ambulatory Visit
Admission: RE | Admit: 2012-10-31 | Discharge: 2012-10-31 | Disposition: A | Discharge status: Home / Self Care | Payer: Medicare Other | Source: Ambulatory Visit | Attending: Radiology | Admitting: Radiology

## 2012-10-31 ENCOUNTER — Telehealth: Payer: Self-pay | Admitting: *Deleted

## 2012-10-31 DIAGNOSIS — C50919 Malignant neoplasm of unspecified site of unspecified female breast: Secondary | ICD-10-CM | POA: Diagnosis not present

## 2012-10-31 DIAGNOSIS — C50312 Malignant neoplasm of lower-inner quadrant of left female breast: Secondary | ICD-10-CM

## 2012-10-31 MED ORDER — GADOBENATE DIMEGLUMINE 529 MG/ML IV SOLN
14.0000 mL | Freq: Once | INTRAVENOUS | Status: AC | PRN
  Administered 2012-10-31: 14 mL via INTRAVENOUS

## 2012-10-31 NOTE — Telephone Encounter (Signed)
Confirmed [facility] for 11/02/12 at 1200 .  Instructions and contact information given.

## 2012-11-02 ENCOUNTER — Ambulatory Visit: Payer: Medicare Other | Admitting: Physical Therapy

## 2012-11-02 ENCOUNTER — Other Ambulatory Visit (HOSPITAL_BASED_OUTPATIENT_CLINIC_OR_DEPARTMENT_OTHER): Payer: Medicare Other | Admitting: Lab

## 2012-11-02 ENCOUNTER — Encounter: Payer: Self-pay | Admitting: *Deleted

## 2012-11-02 ENCOUNTER — Encounter: Payer: Self-pay | Admitting: Oncology

## 2012-11-02 ENCOUNTER — Ambulatory Visit (HOSPITAL_BASED_OUTPATIENT_CLINIC_OR_DEPARTMENT_OTHER): Payer: Medicare Other | Admitting: Oncology

## 2012-11-02 ENCOUNTER — Ambulatory Visit: Payer: Medicare Other | Attending: General Surgery | Admitting: Physical Therapy

## 2012-11-02 ENCOUNTER — Ambulatory Visit (INDEPENDENT_AMBULATORY_CARE_PROVIDER_SITE_OTHER): Payer: Self-pay | Admitting: General Surgery

## 2012-11-02 ENCOUNTER — Ambulatory Visit: Payer: Medicare Other

## 2012-11-02 ENCOUNTER — Ambulatory Visit (INDEPENDENT_AMBULATORY_CARE_PROVIDER_SITE_OTHER): Payer: Medicare Other | Admitting: General Surgery

## 2012-11-02 ENCOUNTER — Ambulatory Visit: Payer: Medicare Other | Admitting: Oncology

## 2012-11-02 ENCOUNTER — Encounter (INDEPENDENT_AMBULATORY_CARE_PROVIDER_SITE_OTHER): Payer: Medicare Other | Admitting: General Surgery

## 2012-11-02 ENCOUNTER — Ambulatory Visit
Admission: RE | Admit: 2012-11-02 | Discharge: 2012-11-02 | Disposition: A | Discharge status: Home / Self Care | Payer: Medicare Other | Source: Ambulatory Visit | Attending: Radiation Oncology | Admitting: Radiation Oncology

## 2012-11-02 ENCOUNTER — Other Ambulatory Visit: Payer: Medicare Other | Admitting: Lab

## 2012-11-02 ENCOUNTER — Telehealth: Payer: Self-pay | Admitting: Oncology

## 2012-11-02 VITALS — BP 134/78 | HR 71 | Temp 98.5°F | Resp 20 | Ht 64.0 in | Wt 158.9 lb

## 2012-11-02 DIAGNOSIS — Z170 Estrogen receptor positive status [ER+]: Secondary | ICD-10-CM

## 2012-11-02 DIAGNOSIS — Z87891 Personal history of nicotine dependence: Secondary | ICD-10-CM | POA: Diagnosis not present

## 2012-11-02 DIAGNOSIS — C50911 Malignant neoplasm of unspecified site of right female breast: Secondary | ICD-10-CM

## 2012-11-02 DIAGNOSIS — IMO0001 Reserved for inherently not codable concepts without codable children: Secondary | ICD-10-CM | POA: Insufficient documentation

## 2012-11-02 DIAGNOSIS — C50919 Malignant neoplasm of unspecified site of unspecified female breast: Secondary | ICD-10-CM | POA: Insufficient documentation

## 2012-11-02 DIAGNOSIS — M25519 Pain in unspecified shoulder: Secondary | ICD-10-CM | POA: Insufficient documentation

## 2012-11-02 DIAGNOSIS — C50312 Malignant neoplasm of lower-inner quadrant of left female breast: Secondary | ICD-10-CM

## 2012-11-02 DIAGNOSIS — C50019 Malignant neoplasm of nipple and areola, unspecified female breast: Secondary | ICD-10-CM | POA: Diagnosis not present

## 2012-11-02 DIAGNOSIS — Z801 Family history of malignant neoplasm of trachea, bronchus and lung: Secondary | ICD-10-CM | POA: Diagnosis not present

## 2012-11-02 DIAGNOSIS — M25619 Stiffness of unspecified shoulder, not elsewhere classified: Secondary | ICD-10-CM | POA: Diagnosis not present

## 2012-11-02 DIAGNOSIS — C50319 Malignant neoplasm of lower-inner quadrant of unspecified female breast: Secondary | ICD-10-CM | POA: Diagnosis not present

## 2012-11-02 LAB — CBC WITH DIFFERENTIAL/PLATELET
Basophils Absolute: 0 10*3/uL (ref 0.0–0.1)
Eosinophils Absolute: 0.1 10*3/uL (ref 0.0–0.5)
HCT: 37.6 % (ref 34.8–46.6)
HGB: 12.6 g/dL (ref 11.6–15.9)
MONO#: 0.5 10*3/uL (ref 0.1–0.9)
NEUT#: 2.6 10*3/uL (ref 1.5–6.5)
NEUT%: 51.4 % (ref 38.4–76.8)
RDW: 13.8 % (ref 11.2–14.5)
lymph#: 1.9 10*3/uL (ref 0.9–3.3)

## 2012-11-02 LAB — COMPREHENSIVE METABOLIC PANEL (CC13)
AST: 25 U/L (ref 5–34)
Albumin: 3.6 g/dL (ref 3.5–5.0)
BUN: 13.2 mg/dL (ref 7.0–26.0)
CO2: 27 mEq/L (ref 22–29)
Calcium: 9.3 mg/dL (ref 8.4–10.4)
Chloride: 107 mEq/L (ref 98–107)
Creatinine: 0.8 mg/dL (ref 0.6–1.1)
Glucose: 104 mg/dl — ABNORMAL HIGH (ref 70–99)
Potassium: 4.1 mEq/L (ref 3.5–5.1)

## 2012-11-02 LAB — CANCER ANTIGEN 27.29: CA 27.29: 14 U/mL (ref 0–39)

## 2012-11-02 NOTE — Patient Instructions (Signed)
IF YOU ARE TAKING ASPIRIN, COUMADIN/WARFARIN, PLAVIX, OR OTHER BLOOD THINNER, PLEASE LET US KNOW IMMEDIATELY.  WE WILL NEED TO DISCUSS WITH THE PRESCRIBING PROVIDER IF THESE ARE SAFE TO STOP. IF THESE ARE NOT STOPPED AT THE APPROPRIATE TIME, THIS WILL RESULT IN A DELAY FOR YOUR SURGERY.  DO NOT TAKE THESE MEDICATIONS OR IBUPROFEN/NAPROXEN WITHIN A WEEK BEFORE SURGERY.   The main risks of surgery are bleeding, infection, damage to other structures, and seroma (accumulation of fluid) under the incision site(s).    These complications may lead to additional procedures such as drainage of seroma/infection.  If cancer is found, you may need other surgeries to obtain negative margins or to take more lymph nodes.   Most women do accumulate fluid in the breast cavity where the specimen was removed. We do not always have to drain this fluid.  If your breast is very tense, painful, or red, then we may need to numb the skin and use a needle to aspirate the fluid.  We do provide patients with a Breast Binder.  The purpose of this is to avoid the use of tape on the sensitive tissue of the breast and to provide some compression to minimize the risk of seroma.  If the binder is uncomfortable, you may find that a tank top with a built-in shelf bra or a loose sports bra works better for you.  I recommend wearing this around the clock for the first 1-2 weeks except in the shower.    You may remove your dressings and may shower 48 hours after surgery IF YOU DO NOT HAVE A DRAIN.    Many patients have some constipation in the week after surgery due to the narcotics and anesthesia.  You may need over the counter stool softeners or laxatives if you experience difficulty having bowel movements.    If the following occur, call our office at [phone]: If you have a fever over 101 or pain that is severe despite narcotics. If you have redness or drainage at the wound. If you develop persistent nausea or vomiting.  I  will follow you back up in 1-4 weeks.    Please submit any paperwork about time off work/insurance forms to the front desk.   

## 2012-11-02 NOTE — Assessment & Plan Note (Signed)
patient

## 2012-11-02 NOTE — Telephone Encounter (Signed)
pt calle to r/s appt.Marland KitchenMarland KitchenMarland KitchenDone

## 2012-11-02 NOTE — Assessment & Plan Note (Signed)
Patient is a 67 year old female with a new diagnosis of left breast cancer. Based on the size of this and the size of her breasts, she will require a mastectomy for acceptable cosmetic results.  She is also not a candidate for immediate reconstruction because she may require radiation. The decision for radiation would be based on her final pathology. We will go ahead and refer her for plastic surgery evaluation. I reviewed the risk of mastectomy with her. I discussed bleeding, infection, damage to adjacent structures, seroma, recurrent cancer, and cardiopulmonary issues. I discussed the recovery and pain management. I also discussed that she would not be able to be the primary caregiver for her mother for a minimum a week. I advised her that she would have a drain postoperatively and would need to stay overnight.

## 2012-11-02 NOTE — Progress Notes (Signed)
Checked in new pt with no financial concerns. °

## 2012-11-02 NOTE — Progress Notes (Signed)
ID: Kolbi Tofte   DOB: [birth year]  MR#: [id number]  HYQ#:[id number]  PCP: Barbette Reichmann, MD GYN:  SU: Almond Lint OTHER MD: Dorothy Puffer, Jeralyn Ruths  ALERT: Another patient with the same date of birth, middle initial, and also with a history of breast cancer is in actinic. Please make sure when dealing with Yanin L. Wike do have the correct medical record number  HISTORY OF PRESENT ILLNESS: The patient tells me she had an unremarkable screening mammogram January of 2013 in [place]. I do not have that report. Shortly after that, February of 2013, she noted that her left nipple had become inverted. She initially thought this was due to the mammogram itself, but eventually brought it to her primary care physician's attention. This was followed and there was no apparent change when she was next seen December 2013. The patient however became concerned and decided to go back to [facility] for her annual mammography This is where she had her annual mammograms until the last couple of years. Bilateral diagnostic mammography and left ultrasonography there 10/24/2012 showed definite nipple deformity with inversion and retraction. There was subtle architectural distortion and increase in density relative to the preceding study. Left breast ultrasonography in addition to the nipple retraction and inversion showed an area of hyperechoic architectural distortion measuring 1.4 cm in diameter with Doppler flow. Breast specific gamma imaging was obtained February 2024 and confirmed an area of moderate intensity isotope activity following a radial distribution from the left nipple measuring at least 4 cm. 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  Bilateral breast MRI was obtained 10/31/2012 and showed an area of  diffuse non-masslike asymmetric enhancement in the inferior aspect of the left breast measuring up to 7.4 cm transversely. There were no enlarged axillary or internal mammary nodes noted and the right breast was unremarkable. The patient's subsequent history is as detailed below.  INTERVAL HISTORY: [name] was seen at the multidisciplinary breast cancer clinic 11/02/2012 accompanied by her husband Fredrik Cove  REVIEW OF SYSTEMS:  She tolerated the biopsy with mild soreness is the only side effect. She does have some chronic discomfort involving the right shoulder. She has mild sinus symptoms, ringing in her ears, and this light sore throat. She has a dry cough. She attributes all these symptoms to seasonal allergies. She has moderate heartburn. She has some minor arthritis pains would here and there". Otherwise a detailed review of systems today was noncontributory   PAST MEDICAL HISTORY: Past Medical History  Diagnosis Date  . Breast cancer   . Arthritis   . Carpal tunnel syndrome of right wrist     PAST SURGICAL HISTORY: Past Surgical History  Procedure Laterality Date  . Abdominal hysterectomy    . Appendectomy    . Tonsillectomy    . Meniscus repair      Right Knee   right shoulder rotator cuff repair, right hand carpal tunnel syndrome, bilateral salpingo--oophorectomy with her hysterectomy in the 1990s, Lasix surgery, right toe surgery  FAMILY HISTORY Family History  Problem Relation Age of Onset  . Bladder Cancer Father   . Lung cancer Father    the patient's father died at the age of 41. He was diagnosed with lung cancer the age of 29 and with bladder cancer at the age of 34. He was a smoker. The patient's mother  is living at age 55. She has a history of basal cell carcinoma. Senaya had no brothers, one sister. There is no history of breast or ovarian cancer in the immediate family.  GYNECOLOGIC HISTORY: Menarche age 66, first live birth age 60, she is GX P2. She had her hysterectomy  she thinks in 1992, and took hormone replacement for approximately 18 years. In addition she took birth control pills between 1968 and 1977 stopping only to conceive.  SOCIAL HISTORY: She is a retired Environmental health practitioner. Her husband Jaydy Fitzhenry (goes by Fredrik Cove) is in Airline pilot for at [employer] in [place]. Son Erinne Gillentine lives in [place] and works in Chief Financial Officer. Son R.Alexsandria Kivett II lives in [place] and works in Airline pilot. The patient has 5 grandchildren" one on the way".   ADVANCED DIRECTIVES: Not in place  HEALTH MAINTENANCE: History  Substance Use Topics  . Smoking status: Former Games developer  . Smokeless tobacco: Not on file  . Alcohol Use: Yes     Colonoscopy: August 2003  PAP: Status post hysterectomy  Bone density: Never  Lipid panel:  No Known Allergies  No current outpatient prescriptions on file.   No current facility-administered medications for this visit.    OBJECTIVE: Middle-aged white woman who appears well Filed Vitals:   11/02/12 1242  BP: 134/78  Pulse: 71  Temp: 98.5 F (36.9 C)  Resp: 20     Body mass index is 27.26 kg/(m^2).    ECOG FS: 0  Sclerae unicteric Oropharynx clear No cervical or supraclavicular adenopathy Lungs no rales or rhonchi Heart regular rate and rhythm Abd benign MSK no focal spinal tenderness, no peripheral edema Neuro: nonfocal Breasts: The right breast is unremarkable. The left breast shows nipple inversion, and ecchymosis at the site of biopsy. I do not palpate a well-defined mass aside from the ecchymosis itself. The left axilla is benign.  LAB RESULTS: Lab Results  Component Value Date   WBC 5.1 11/02/2012   NEUTROABS 2.6 11/02/2012   HGB 12.6 11/02/2012   HCT 37.6 11/02/2012   MCV 87.7 11/02/2012   PLT 240 11/02/2012      Chemistry   No results found for this basename: NA, K, CL, CO2, BUN, CREATININE, GLU   No results found for this basename: CALCIUM, ALKPHOS, AST, ALT, BILITOT        No results found for this basename: LABCA2    No components found with this basename: LABCA125    No results found for this basename: INR,  in the last 168 hours  Urinalysis No results found for this basename: colorurine, appearanceur, labspec, phurine, glucoseu, hgbur, bilirubinur, ketonesur, proteinur, urobilinogen, nitrite, leukocytesur    STUDIES: Mr Breast Bilateral W Wo Contrast  11/01/2012  *RADIOLOGY REPORT*  Clinical Data: Recently diagnosed left breast invasive mammary carcinoma with lobular features.  BUN and creatinine were obtained on site at [facility] at [address] Results:  BUN 11 mg/dL,  Creatinine 0.7 mg/dL.  BILATERAL BREAST MRI WITH AND WITHOUT CONTRAST  Technique: Multiplanar, multisequence MR images of both breasts were obtained prior to and following the intravenous administration of 14ml of multihance.  Three dimensional images were evaluated at the independent DynaCad workstation.  Comparison:  Images from [facility] dated 10/27/2012, 10/14/2012, 12/10/2008 and 12/04/2008.  Findings:  There is a moderate background parenchymal enhancement pattern.  Right breast:  There is no abnormal enhancement the right breast.  Left breast:  The left nipple is retracted.  There is  diffuse non mass-like, asymmetric enhancement involving the inferior aspect of the breast. The enhancement measures 7.4 x 5.4 x 5.7 cm in transverse, anterior posterior and longitudinal dimensions.  Signal void artifact is seen in the anterior third 9 o'clock region of the left breast from a biopsy clip.  There is no enlarged axillary or internal mammary adenopathy.  IMPRESSION: Diffuse non mass-like enhancement measuring 7.4 cm in the inferior aspect of the left breast, causing nipple retraction corresponding well with the known invasive mammary carcinoma with lobular features.  RECOMMENDATION: Treatment planning is recommended.  THREE-DIMENSIONAL MR IMAGE RENDERING ON  INDEPENDENT WORKSTATION:  Three-dimensional MR images were rendered by post-processing of the original MR data on an independent workstation.  The three- dimensional MR images were interpreted, and findings were reported in the accompanying complete MRI report for this study.  BI-RADS CATEGORY 6:  Known biopsy-proven malignancy - appropriate action should be taken.   Original Report Authenticated By: Baird Lyons, M.D.     ASSESSMENT: 67 y.o. [place] woman status post left breast biopsy 10/27/2012 for a clinical T3 N0, stage IIb invasive mammary carcinoma with lobular features, estrogen receptor 100% positive, progesterone receptor and HER-2 negative, with an MIB-1 of 10%.  PLAN: We spent the better part of her hour-plus visit today going over the biology of her tumor and treatment of breast cancer in general. She understands, after reviewing the MRI, that she will need a left mastectomy. It would not be unreasonable for her to consider bilateral mastectomies and she is discussing this with Dr. Donell Beers. She will definitely benefit from antiestrogens as far as systemic therapy is concerned. The question is how much benefit she might get from chemotherapy. She understands that if she turns out to be node positive she will need chemotherapy, but if she is node negative we will send an Oncotype DX test. This takes approximately 2 weeks, and accordingly I am going to see her again the first week in April. By then we should have all the information necessary for [place] to make a final decision together whether chemotherapy is in her best interest or not.  We also discussed reconstruction, and since it is not clear at this point whether she may need postmastectomy radiation, the plan is not to do immediate reconstruction, but instead plan for that later, and of course this gives her extra time to meet with a plastic surgeon and discuss simple right mastectomy with bilateral reconstruction as the next procedure.  She  knows to call for any problems that may develop before the next visit.   Terrion Poblano C    11/02/2012

## 2012-11-02 NOTE — Progress Notes (Signed)
Chief complaint:  Left breast cancer  HISTORY: Patient is a 67 year old female who has had some left-sided nipple retraction for around a year. She noticed this within a few weeks to months following her last mammogram. Her last mammogram before this one was normal.  Sure she had an abdominal region of around 1.4 cm with architectural distortion on the mammogram. The ultrasound did not clearly define the mass. She had a 4 cm area of abnormality on BS GI and MRI area of 7.4 cm. Biopsy was positive for invasive mammary carcinoma with lobular features. This was estrogen receptor positive, progesterone receptor negative, HER-2 negative, and Ki67 was 10%.  Her father had a history of bladder cancer and lung cancer. Her mother had basal carcinoma of the skin. She has no Jewish ancestry. She has one sibling that does not have cancer. She has 2 children and is a former smoker. She drinks around fiberglass wine per week. She had menarche at age 32 and was postmenopausal following her hysterectomy with tube nephrectomy. She had 2 children starting at age 33 and was on hormone contraception for 9 years. She has had a colonoscopy around 11 years ago.  Past Medical History  Diagnosis Date  . Breast cancer   . Arthritis   . Carpal tunnel syndrome of right wrist     Past Surgical History  Procedure Laterality Date  . Abdominal hysterectomy    . Appendectomy    . Tonsillectomy    . Meniscus repair      Right Knee   Meds: Celexa Fennel MVT Glucosamine Calcium citrate Biotin   ALL: sulfa.  Family History  Problem Relation Age of Onset  . Bladder Cancer Father   . Lung cancer Father      History   Social History  . Marital Status: Married    Spouse Name: N/A    Number of Children: N/A  . Years of Education: N/A   Social History Main Topics  . Smoking status: Former Games developer  . Smokeless tobacco: Not on file  . Alcohol Use: Yes  . Drug Use: No  . Sexually Active: Not on file   Other  Topics Concern  . Not on file   Social History Narrative  . No narrative on file     REVIEW OF SYSTEMS - PERTINENT POSITIVES ONLY: 12 point review of systems negative other than HPI and PMH except for right shoulder pain, inverted nipple, heartburn.  EXAM: Wt Readings from Last 3 Encounters:  11/02/12 158 lb 14.4 oz (72.077 kg)   Temp Readings from Last 3 Encounters:  11/02/12 98.5 F (36.9 C) Oral   BP Readings from Last 3 Encounters:  11/02/12 134/78   Pulse Readings from Last 3 Encounters:  11/02/12 71     Gen:  No acute distress.  Well nourished and well groomed.   Neurological: Alert and oriented to person, place, and time. Coordination normal.  Head: Normocephalic and atraumatic.  Eyes: Conjunctivae are normal. Pupils are equal, round, and reactive to light. No scleral icterus.  Neck: Normal range of motion. Neck supple. No tracheal deviation or thyromegaly present.  Cardiovascular: Normal rate, regular rhythm, normal heart sounds and intact distal pulses.  Exam reveals no gallop and no friction rub.  No murmur heard. Respiratory: Effort normal.  No respiratory distress. No chest wall tenderness. Breath sounds normal.  No wheezes, rales or rhonchi.  Breast:  Left breast with medial fullness and mass around 4 cm.  Left nipple retraction.  No  lymphadenopathy.  Right side normal.   GI: Soft. Bowel sounds are normal. The abdomen is soft and nontender.  There is no rebound and no guarding.  Musculoskeletal: Normal range of motion. Extremities are nontender.  Lymphadenopathy: No cervical, preauricular, postauricular or axillary adenopathy is present Skin: Skin is warm and dry. No rash noted. No diaphoresis. No erythema. No pallor. No clubbing, cyanosis, or edema.   Psychiatric: Normal mood and affect. Behavior is normal. Judgment and thought content normal.    LABORATORY RESULTS: Available labs are reviewed  Normal CBC  RADIOLOGY RESULTS: See E-Chart or I-Site for most  recent results.  Images and reports are reviewed.  MRI IMPRESSION:  Diffuse non mass-like enhancement measuring 7.4 cm in the inferior  aspect of the left breast, causing nipple retraction corresponding  well with the known invasive mammary carcinoma with lobular  features.   ASSESSMENT AND PLAN: Cancer of lower-inner quadrant of female breast Patient is a 67 year old female with a new diagnosis of left breast cancer. Based on the size of this and the size of her breasts, she will require a mastectomy for acceptable cosmetic results.  She is also not a candidate for immediate reconstruction because she may require radiation. The decision for radiation would be based on her final pathology. We will go ahead and refer her for plastic surgery evaluation. I reviewed the risk of mastectomy with her. I discussed bleeding, infection, damage to adjacent structures, seroma, recurrent cancer, and cardiopulmonary issues. I discussed the recovery and pain management. I also discussed that she would not be able to be the primary caregiver for her mother for a minimum a week. I advised her that she would have a drain postoperatively and would need to stay overnight.  The patient also discussed contralateral prophylactic mastectomy. I advised her to meet with the plastic surgeon first and to consider this decision. We certainly can do that at the time of reconstruction.    Maudry Diego MD Surgical Oncology, General and Endocrine Surgery [facility]      Visit Diagnoses: 1. Cancer of lower-inner quadrant of female breast, left     Primary Care Physician: Barbette Reichmann, MD [facility]

## 2012-11-03 NOTE — Progress Notes (Signed)
This encounter was created in error - please disregard.

## 2012-11-04 ENCOUNTER — Encounter (HOSPITAL_COMMUNITY): Payer: Self-pay

## 2012-11-05 ENCOUNTER — Encounter: Payer: Self-pay | Admitting: Radiation Oncology

## 2012-11-05 DIAGNOSIS — J939 Pneumothorax, unspecified: Secondary | ICD-10-CM

## 2012-11-05 HISTORY — DX: Pneumothorax, unspecified: J93.9

## 2012-11-05 NOTE — Progress Notes (Signed)
Radiation Oncology         [phone] ________________________________  Name: Cynthia Griffin MRN: [id number]  Date: 11/05/2012  DOB: 03/08/1946  WU:JWJXB,JYNWGNFAOZ, MD  Almond Lint, MD  Ruthann Cancer, MD  REFERRING PHYSICIAN: Almond Lint, MD  DIAGNOSIS: Invasive mammary carcinoma of the left breast with lobular features  HISTORY OF PRESENT ILLNESS::Cynthia Griffin is a 67 y.o. female who is seen for an initial consultation visit. The patient indicates that she noticed inversion of the nipple approximately one year ago. This was noticed approximately 1 month after her mammogram in January of 2013 which she states was normal. The patient felt that this change may have been related to her mammogram. However this persisted and she therefore brought this change to the attention of her primary care physician. She also decided to go back for annual mammogram through [facility] or recently. Bilateral diagnostic mammography and left ultrasound was completed on 10/24/2012. The ultrasound showed an area of architectural distortion measuring 1.4 cm in diameter. Breast specific gamma imaging was also completed and this showed an area of abnormal activity measuring at least 4 cm in approximately the 8:00 to 9:00 position. This corresponded to the prior suspicious area. No abnormal findings on the right. A biopsy was performed and this returned positive for invasive mammary carcinoma with lobular features. Receptor studies showed that the tumor is ER positive, PR negative and HER-2/neu negative. The proliferative index was 10%.  The patient then proceeded to undergo an MRI scan of the breasts bilaterally. This showed a diffuse area of non-masslike enhancement within the left breast measuring up to 7.4 cm. No abnormal lymph nodes were present.  The patient is seen today in multidisciplinary breast clinic.   PREVIOUS RADIATION THERAPY: No   PAST MEDICAL HISTORY:  has a past medical history of Breast cancer;  Arthritis; and Carpal tunnel syndrome of right wrist.     PAST SURGICAL HISTORY: Past Surgical History  Procedure Laterality Date  . Abdominal hysterectomy    . Appendectomy    . Tonsillectomy    . Meniscus repair      Right Knee     FAMILY HISTORY: family history includes Bladder Cancer in her father and Lung cancer in her father.   SOCIAL HISTORY:  reports that she has quit smoking. She does not have any smokeless tobacco history on file. She reports that  drinks alcohol. She reports that she does not use illicit drugs.   ALLERGIES: Sulfa antibiotics   MEDICATIONS:  Current Outpatient Prescriptions  Medication Sig Dispense Refill  . Biotin 5000 MCG CAPS Take 5,000 mcg by mouth daily.      . calcium citrate-vitamin D (CITRACAL+D) 315-200 MG-UNIT per tablet Take 1 tablet by mouth daily.      . citalopram (CELEXA) 40 MG tablet Take 40 mg by mouth daily.      . Glucosamine-Chondroit-Vit C-Mn (GLUCOSAMINE CHONDR 1500 COMPLX PO) Take 1 capsule by mouth daily.      . Multiple Vitamin (MULTIVITAMIN WITH MINERALS) TABS Take 1 tablet by mouth daily.       No current facility-administered medications for this visit.     REVIEW OF SYSTEMS:  A 15 point review of systems is documented in the electronic medical record. This was obtained by the nursing staff. However, I reviewed this with the patient to discuss relevant findings and make appropriate changes.  Pertinent items are noted in HPI.    PHYSICAL EXAM:  vitals were not taken for this visit.  General: Well-developed, in no acute distress HEENT: Normocephalic, atraumatic; oral cavity clear Neck: Supple without any lymphadenopathy Cardiovascular: Regular rate and rhythm Respiratory: Clear to auscultation bilaterally Breasts: Some bruising is present post biopsy within the left breast with nipple retraction. I do not appreciate any discrete tumor. No lymphadenopathy within the left axilla. Normal breast exam on the right GI: Soft,  nontender, normal bowel sounds Extremities: No edema present Neuro: No focal deficits     LABORATORY DATA:  Lab Results  Component Value Date   WBC 5.1 11/02/2012   HGB 12.6 11/02/2012   HCT 37.6 11/02/2012   MCV 87.7 11/02/2012   PLT 240 11/02/2012   Lab Results  Component Value Date   NA 143 11/02/2012   K 4.1 11/02/2012   CL 107 11/02/2012   CO2 27 11/02/2012   Lab Results  Component Value Date   ALT 35 11/02/2012   AST 25 11/02/2012   ALKPHOS 56 11/02/2012   BILITOT 0.39 11/02/2012      RADIOGRAPHY: Mr Breast Bilateral W Wo Contrast  11/01/2012  *RADIOLOGY REPORT*  Clinical Data: Recently diagnosed left breast invasive mammary carcinoma with lobular features.  BUN and creatinine were obtained on site at [facility] at [address] Results:  BUN 11 mg/dL,  Creatinine 0.7 mg/dL.  BILATERAL BREAST MRI WITH AND WITHOUT CONTRAST  Technique: Multiplanar, multisequence MR images of both breasts were obtained prior to and following the intravenous administration of 14ml of multihance.  Three dimensional images were evaluated at the independent DynaCad workstation.  Comparison:  Images from [facility] dated 10/27/2012, 10/14/2012, 12/10/2008 and 12/04/2008.  Findings:  There is a moderate background parenchymal enhancement pattern.  Right breast:  There is no abnormal enhancement the right breast.  Left breast:  The left nipple is retracted.  There is diffuse non mass-like, asymmetric enhancement involving the inferior aspect of the breast. The enhancement measures 7.4 x 5.4 x 5.7 cm in transverse, anterior posterior and longitudinal dimensions.  Signal void artifact is seen in the anterior third 9 o'clock region of the left breast from a biopsy clip.  There is no enlarged axillary or internal mammary adenopathy.  IMPRESSION: Diffuse non mass-like enhancement measuring 7.4 cm in the inferior aspect of the left breast, causing nipple retraction corresponding well with the known  invasive mammary carcinoma with lobular features.  RECOMMENDATION: Treatment planning is recommended.  THREE-DIMENSIONAL MR IMAGE RENDERING ON INDEPENDENT WORKSTATION:  Three-dimensional MR images were rendered by post-processing of the original MR data on an independent workstation.  The three- dimensional MR images were interpreted, and findings were reported in the accompanying complete MRI report for this study.  BI-RADS CATEGORY 6:  Known biopsy-proven malignancy - appropriate action should be taken.   Original Report Authenticated By: Baird Lyons, M.D.        IMPRESSION: The patient is a pleasant 67 year old female with a recent diagnosis of left-sided breast cancer. This clinically represents a T3, N0, M0 tumor. The tumor is ER positive, PR negative, and HER-2/neu negative.   PLAN: The consensus recommendation was that neoadjuvant chemotherapy would not be a substantial benefit for the patient. Given the size of the lesion on MRI scan, and this fact, then proceeding with surgical resection with a mastectomy was recommended for her. Chemotherapy will be recommended for her if she is node-positive and an Oncotype test will be performed if she is pathologically node negative.  I discussed with her the potential for postmastectomy radiotherapy. If the size  of the tumor on MRI scan turns out to be accurate and the patient does have a T3 tumor, then I would recommend postmastectomy radiotherapy. If not, and she is node negative, and then she likely would not benefit from radiation.  We discussed these issues including a potential 6-1/2 week course of adjuvant radiotherapy. We discussed the rationale of such a treatment to help improve local control. We also discussed the potential side effects and risks of treatment as well. All of her questions were answered and she was given contact information. I look forward to seeing her in the future to help further coordinate her care and she will be scheduled to  see me at the appropriate time so we can review her case at that point.     ________________________________   Radene Gunning, MD, PhD

## 2012-11-08 ENCOUNTER — Telehealth: Payer: Self-pay | Admitting: *Deleted

## 2012-11-08 ENCOUNTER — Encounter: Payer: Self-pay | Admitting: Family

## 2012-11-08 ENCOUNTER — Telehealth: Payer: Self-pay | Admitting: Oncology

## 2012-11-08 ENCOUNTER — Ambulatory Visit (HOSPITAL_BASED_OUTPATIENT_CLINIC_OR_DEPARTMENT_OTHER): Payer: Medicare Other | Admitting: Lab

## 2012-11-08 ENCOUNTER — Ambulatory Visit (HOSPITAL_BASED_OUTPATIENT_CLINIC_OR_DEPARTMENT_OTHER): Payer: Medicare Other | Admitting: Family

## 2012-11-08 VITALS — BP 175/89 | HR 78 | Temp 98.3°F | Resp 20 | Ht 69.5 in | Wt 189.8 lb

## 2012-11-08 DIAGNOSIS — M8580 Other specified disorders of bone density and structure, unspecified site: Secondary | ICD-10-CM

## 2012-11-08 DIAGNOSIS — C50912 Malignant neoplasm of unspecified site of left female breast: Secondary | ICD-10-CM

## 2012-11-08 DIAGNOSIS — Z853 Personal history of malignant neoplasm of breast: Secondary | ICD-10-CM

## 2012-11-08 DIAGNOSIS — E559 Vitamin D deficiency, unspecified: Secondary | ICD-10-CM

## 2012-11-08 DIAGNOSIS — M899 Disorder of bone, unspecified: Secondary | ICD-10-CM

## 2012-11-08 DIAGNOSIS — R61 Generalized hyperhidrosis: Secondary | ICD-10-CM

## 2012-11-08 LAB — CBC WITH DIFFERENTIAL/PLATELET
Eosinophils Absolute: 0.2 10*3/uL (ref 0.0–0.5)
MONO#: 0.3 10*3/uL (ref 0.1–0.9)
NEUT#: 2.1 10*3/uL (ref 1.5–6.5)
Platelets: 322 10*3/uL (ref 145–400)
RBC: 4.89 10*6/uL (ref 3.70–5.45)
RDW: 13.3 % (ref 11.2–14.5)
WBC: 4.6 10*3/uL (ref 3.9–10.3)

## 2012-11-08 LAB — COMPREHENSIVE METABOLIC PANEL (CC13)
ALT: 25 U/L (ref 0–55)
AST: 23 U/L (ref 5–34)
Alkaline Phosphatase: 118 U/L (ref 40–150)
CO2: 26 mEq/L (ref 22–29)
Calcium: 9.4 mg/dL (ref 8.4–10.4)
Chloride: 103 mEq/L (ref 98–107)
Glucose: 106 mg/dl — ABNORMAL HIGH (ref 70–99)
Potassium: 3.4 mEq/L — ABNORMAL LOW (ref 3.5–5.1)
Sodium: 139 mEq/L (ref 136–145)

## 2012-11-08 MED ORDER — ALENDRONATE SODIUM 70 MG PO TABS: 70.0000 mg | ORAL_TABLET | ORAL | Status: DC

## 2012-11-08 NOTE — Progress Notes (Signed)
[facility]  Telephone:([phone]) [phone] [phone]  OFFICE PROGRESS NOTE  PATIENT: [name]   DOB: Mar 17, 1946  MR#: [id number]  JYN#:[id number]   QM:VHQION, EMILY, MD   DIAGNOSIS: 67 year old [place], [place] woman with invasive ductal carcinoma of the left breast and was diagnosed in 2007.  PRIOR THERAPY: 1. Status post left breast lumpectomy with left axillary biopsy on 08/12/2006 with 0/1 positive lymph nodes for a T1c N0, ER 67%, PR 99%, Ki-67 14% HER-2/neu negative, invasive ductal carcinoma of the left breast (1.2 cm).  2. Oncotype score 6 with rate of distant recurrence at 10 years of 5%.  3. Radiation therapy from 10/11/2006 through 11/29/2006.  4. Started antiestrogen therapy with Arimidex on 12/01/2006.  CURRENT THERAPY:  Arimidex 1 mg by mouth daily since 11/2006.   INTERVAL HISTORY:  Dr. Welton Flakes and I saw [name] of invasive ductal carcinoma of the left breast. The patient is accompanied by her daughter Velna Hatchet for today's office visit. The patient was last seen by Dr. Donnie Coffin on 09/16/2011.   Since her last office visit, the patient states that she has continuing night sweats and hot flashes. She also experienced a GI "bug" that persisted for one week and consisted of vomiting and diarrhea. The patient was seen by her primary care physician who stated her GI discomfort was due to a virus and it had to run its course. Her symptoms resolved within one week and her GI symptoms happened 1 week ago. The patient denies any other symptomatology.  PAST MEDICAL HISTORY: Past Medical History  Diagnosis Date  . Diabetes mellitus   . GERD (gastroesophageal reflux disease)   . Hyperlipidemia   . Hypertension   . Schizophrenia   . Atypical nevus   . History of mitral valve prolapse   . Breast cancer 07/14/06    Left breast. Ductal carcinoma, grade 3. Followed by Dr. Donnie Coffin. s/p lumpectomy with radiation  . Goiter     U/S  08/2009-persistent multiple small nodules, one in the right lower pole slightly enlarged, recommend continued Kim.  . Ovarian cyst   . Fibroids   . DVT (deep vein thrombosis) in pregnancy 1982    Affected the RLE. Required coumadin.  . Dental caries     PAST SURGICAL HISTORY: Past Surgical History  Procedure Laterality Date  . Lumpectory Left   . Tonsillectomy    . Tubal ligation    . Tubal ligation reversal    . Vagina surgery       FAMILY HISTORY: Family History  Problem Relation Age of Onset  . Diabetes Mother   . Diabetes Father     SOCIAL HISTORY: History  Substance Use Topics  . Smoking status: Former Games developer  . Smokeless tobacco: Never Used     Comment: quit 9 yrs ago.  . Alcohol Use: No    ALLERGIES: Allergies  Allergen Reactions  . Penicillins      MEDICATIONS:  Current Outpatient Prescriptions  Medication Sig Dispense Refill  . anastrozole (ARIMIDEX) 1 MG tablet TAKE 1 TABLET BY MOUTH ONCE A DAY  30 tablet  12  . aspirin 81 MG tablet Take 1 tablet (81 mg total) by mouth daily.  30 tablet  11  . calcium-vitamin D (OSCAL 500/200 D-3) 500-200 MG-UNIT per tablet Take 1 tablet by mouth 3 (three) times daily.  30 tablet  11  . glucose blood test strip Use 1-3 times a day to check your blood sugar  100  each  11  . insulin glargine (LANTUS) 100 UNIT/ML injection Inject 33 Units into the skin daily.  3 mL  3  . Insulin Pen Needle (PEN NEEDLES) 31G X 6 MM MISC Use to inject insulin once daily  100 each  11  . Lancets MISC 2 Units by Does not apply route daily. Use 1-2 times a day to check your blood sugar  100 each  11  . lisinopril (PRINIVIL,ZESTRIL) 5 MG tablet Take 1 tablet (5 mg total) by mouth daily.  90 tablet  3  . metFORMIN (GLUCOPHAGE) 1000 MG tablet Take 1 tablet (1,000 mg total) by mouth 2 (two) times daily with a meal.  60 tablet  11  . metoprolol (LOPRESSOR) 50 MG tablet Take 2 tablets (100 mg total) by mouth 2 (two) times daily.  120 tablet  11   . OLANZapine (ZYPREXA) 10 MG tablet Take 1 tablet by mouth in the morning and 1 tablet in the evening      . pravastatin (PRAVACHOL) 40 MG tablet Take 1 tablet (40 mg total) by mouth daily.  30 tablet  11  . ranitidine (ZANTAC) 150 MG capsule Take 1 capsule (150 mg total) by mouth 2 (two) times daily.  30 capsule  11  . traMADol (ULTRAM) 50 MG tablet Take 1 tablet (50 mg total) by mouth every 8 (eight) hours as needed for pain.  60 tablet  11  . alendronate (FOSAMAX) 70 MG tablet Take 1 tablet (70 mg total) by mouth every 7 (seven) days. Take with a full glass of water on an empty stomach.  60 tablet  2   No current facility-administered medications for this visit.      REVIEW OF SYSTEMS: A 10 point review of systems was completed and is negative except as noted above.    PHYSICAL EXAMINATION: BP 175/89  Pulse 78  Temp(Src) 98.3 F (36.8 C) (Oral)  Resp 20  Ht 5' 9.5" (1.765 m)  Wt 189 lb 12.8 oz (86.093 kg)  BMI 27.64 kg/m2   General appearance: Alert, cooperative, well nourished, no apparent distress Head: Normocephalic, without obvious abnormality, atraumatic Eyes: Arcus senilis, PERRLA, EOMI Nose: Nares, septum and mucosa are normal, no drainage or sinus tenderness Neck: No adenopathy, supple, symmetrical, trachea midline, thyroid not enlarged, no tenderness Resp: Clear to auscultation bilaterally Cardio: Regular rate and rhythm, S1, S2 normal, no murmur, click, rub or gallop Breasts: Soft and pendulous bilaterally, left breast well-healed surgical scar, no lymphadenopathy, no nipple inversion, bilateral axillary fullness,  left breast lump by 3:00 area (not far from surgical scar ) GI: Soft, distended, non-tender, hypoactive bowel sounds, no organomegaly Extremities: Extremities normal, atraumatic, no cyanosis or edema Lymph nodes: Cervical, supraclavicular, and axillary nodes normal Neurologic: Grossly normal    ECOG FS:  Grade 1 - Symptomatic but completely  ambulatory   LAB RESULTS: Lab Results  Component Value Date   WBC 4.6 11/08/2012   NEUTROABS 2.1 11/08/2012   HGB 13.2 11/08/2012   HCT 40.7 11/08/2012   MCV 83.2 11/08/2012   PLT 322 11/08/2012      Chemistry      Component Value Date/Time   NA 138 09/16/2011 0847   K 4.1 09/16/2011 0847   CL 102 09/16/2011 0847   CO2 24 09/16/2011 0847   BUN 12 09/16/2011 0847   CREATININE 0.61 09/16/2011 0847      Component Value Date/Time   CALCIUM 9.6 09/16/2011 0847   ALKPHOS 79 09/16/2011 0847   AST  15 09/16/2011 0847   ALT 13 09/16/2011 0847   BILITOT 0.6 09/16/2011 0847       Lab Results  Component Value Date   LABCA2 29 09/16/2011     RADIOGRAPHIC STUDIES: No results found.  ASSESSMENT: 67 y.o.[place],  [place] woman with:  1. T1c N0, invasive ductal carcinoma of the left breast, grade 2, ER 67%, PR 99%, Ki-67 14% HER-2/neu negative, Oncotype score of 6 with rate of distant recurrence at 10 years of 5%, status post lumpectomy with axillary node biopsy on 08/12/2006, 0/1 positive lymph nodes, and status post radiation therapy completed on 11/29/2006.  2. Hot flashes/night sweats  3. Osteopenia  PLAN: 1. Dr. Welton Flakes discontinued the patient's antiestrogen therapy with Arimidex today. Hopefully her hot flashes and night sweats will subside.  2. We will schedule the patient for a bone density exam in the near future. The patient has an appointment for her annual digital diagnostic bilateral mammogram on 11/16/2012. We will await those results. The patient had a T score of -1.2 (osteopenia) on her last bone density scan in 10/2010. The patient was given a prescription for once weekly by mouth Fosamax with explicit administration instructions.  3. We plan to see the patient again in one year at which time we will check a CMP, CBC, and LDH.  All questions were answered.  The patient was encouraged to contact us in the interim with any problems, questions or concerns.    Larina Bras,  NP-C 11/08/2012, 8:31 PM

## 2012-11-08 NOTE — Telephone Encounter (Signed)
gv pt dtr appt schedule for March 2014 and pt sent back to lb. S/w Victorino Dike @ [facility] re bone density test. Test cannot be done on 3/12 w/mammo. dtr wants to leave mammo as scheduled and per dtr's request she will call [facility] to schedule bone density once she looks at her schedule to see what she can do. Victorino Dike aware and per Victorino Dike this is ok.

## 2012-11-08 NOTE — Patient Instructions (Addendum)
Please contact us at [phone] if you have any questions or concerns.  For Fosamax:   Take on an empty stomach at the same time once a week.  Take with 2 full glasses of water and stay up for at least 1 hour after taking the medication.  Then you may eat after 1 hour.

## 2012-11-08 NOTE — Telephone Encounter (Signed)
Spoke to pt concerning BMDC from 11/02/12.  Pt denies questions or concerns regarding dx or treatment care plan.  Confirmed surgery date and future appts.  Encourage pt to call with needs.  Received verbal understanding.  Contact information given.

## 2012-11-09 ENCOUNTER — Ambulatory Visit: Payer: Medicare Other | Admitting: Physical Therapy

## 2012-11-10 ENCOUNTER — Encounter (HOSPITAL_COMMUNITY): Payer: Self-pay

## 2012-11-10 ENCOUNTER — Encounter (HOSPITAL_COMMUNITY)
Admission: RE | Admit: 2012-11-10 | Discharge: 2012-11-10 | Disposition: A | Discharge status: Home / Self Care | Payer: Medicare Other | Source: Ambulatory Visit | Attending: General Surgery | Admitting: General Surgery

## 2012-11-10 ENCOUNTER — Telehealth: Payer: Self-pay | Admitting: *Deleted

## 2012-11-10 ENCOUNTER — Telehealth: Payer: Self-pay

## 2012-11-10 DIAGNOSIS — C50319 Malignant neoplasm of lower-inner quadrant of unspecified female breast: Secondary | ICD-10-CM | POA: Diagnosis not present

## 2012-11-10 DIAGNOSIS — C773 Secondary and unspecified malignant neoplasm of axilla and upper limb lymph nodes: Secondary | ICD-10-CM | POA: Diagnosis not present

## 2012-11-10 DIAGNOSIS — Z79899 Other long term (current) drug therapy: Secondary | ICD-10-CM | POA: Diagnosis not present

## 2012-11-10 DIAGNOSIS — Z01812 Encounter for preprocedural laboratory examination: Secondary | ICD-10-CM | POA: Diagnosis not present

## 2012-11-10 DIAGNOSIS — D0590 Unspecified type of carcinoma in situ of unspecified breast: Secondary | ICD-10-CM | POA: Diagnosis not present

## 2012-11-10 HISTORY — DX: Anxiety disorder, unspecified: F41.9

## 2012-11-10 NOTE — Telephone Encounter (Signed)
Inocencio Homes -   Could you have her come in and see me in April sometime for a PCP appointment.  I haven't seen her before.  I will try and call her tomorrow to see if she is feeling ok - if she needs to come in for more labwork.   Thanks

## 2012-11-10 NOTE — Telephone Encounter (Signed)
Call from [place] with Dr Santo Held office reporting a Lab value from 3/4. Pt's potassium was 3.4

## 2012-11-10 NOTE — Telephone Encounter (Signed)
Called PCP to inform of lab results for K. Also called pt and LMOVW for pt to follow up with PCP regarding lab. TMB

## 2012-11-10 NOTE — Telephone Encounter (Signed)
Pt called and not at home.  Will call back tomorrow and schedule appointment.

## 2012-11-10 NOTE — Pre-Procedure Instructions (Signed)
Cynthia Griffin  11/10/2012   Your procedure is scheduled on:  11-14-2012  Report to [facility] at 10:50 AM.  Call this number if you have problems the morning of surgery: [phone]   Remember:   Do not eat food or drink liquids after midnight.   Take these medicines the morning of surgery with A SIP OF WATER: citalopram(celexa)   Do not wear jewelry, make-up or nail polish.  Do not wear lotions, powders, or perfumes. You may wear deodorant.  Do not shave 48 hours prior to surgery.   Do not bring valuables to the hospital.  Contacts, dentures or bridgework may not be worn into surgery.  Leave suitcase in the car. After surgery it may be brought to your room.   For patients admitted to the hospital, checkout time is 11:00 AM the day of  discharge.   Patients discharged the day of surgery will not be allowed to drive home.    Special Instructions: Shower using CHG 2 nights before surgery and the night before surgery.  If you shower the day of surgery use CHG.  Use special wash - you have one bottle of CHG for all showers.  You should use approximately 1/3 of the bottle for each shower.   Please read over the following fact sheets that you were given: Pain Booklet and Surgical Site Infection Prevention

## 2012-11-10 NOTE — Progress Notes (Signed)
CBC with diff and CMET done at [facility] on 11-02-2012.Results in EPIC

## 2012-11-11 ENCOUNTER — Telehealth: Payer: Self-pay | Admitting: Internal Medicine

## 2012-11-11 NOTE — Telephone Encounter (Signed)
Patient not home, LM on answering machine asking patient to call next week to discuss lab work.

## 2012-11-14 ENCOUNTER — Encounter (HOSPITAL_COMMUNITY)
Admission: RE | Admit: 2012-11-14 | Discharge: 2012-11-14 | Disposition: A | Discharge status: Home / Self Care | Payer: Medicare Other | Source: Ambulatory Visit | Attending: General Surgery | Admitting: General Surgery

## 2012-11-14 ENCOUNTER — Ambulatory Visit (HOSPITAL_COMMUNITY): Payer: Medicare Other | Admitting: Anesthesiology

## 2012-11-14 ENCOUNTER — Encounter (HOSPITAL_COMMUNITY): Payer: Self-pay | Admitting: Anesthesiology

## 2012-11-14 ENCOUNTER — Encounter (HOSPITAL_COMMUNITY)
Admission: RE | Disposition: A | Discharge status: Home / Self Care | Payer: Self-pay | Source: Ambulatory Visit | Attending: General Surgery

## 2012-11-14 ENCOUNTER — Telehealth: Payer: Self-pay | Admitting: *Deleted

## 2012-11-14 ENCOUNTER — Observation Stay (HOSPITAL_COMMUNITY)
Admission: RE | Admit: 2012-11-14 | Discharge: 2012-11-15 | Disposition: A | Discharge status: Home / Self Care | Payer: Medicare Other | Source: Ambulatory Visit | Attending: General Surgery | Admitting: General Surgery

## 2012-11-14 ENCOUNTER — Encounter (HOSPITAL_COMMUNITY): Payer: Self-pay

## 2012-11-14 DIAGNOSIS — Z01812 Encounter for preprocedural laboratory examination: Secondary | ICD-10-CM | POA: Diagnosis not present

## 2012-11-14 DIAGNOSIS — C50919 Malignant neoplasm of unspecified site of unspecified female breast: Secondary | ICD-10-CM | POA: Insufficient documentation

## 2012-11-14 DIAGNOSIS — C50319 Malignant neoplasm of lower-inner quadrant of unspecified female breast: Principal | ICD-10-CM | POA: Insufficient documentation

## 2012-11-14 DIAGNOSIS — C50312 Malignant neoplasm of lower-inner quadrant of left female breast: Secondary | ICD-10-CM

## 2012-11-14 DIAGNOSIS — G5600 Carpal tunnel syndrome, unspecified upper limb: Secondary | ICD-10-CM | POA: Diagnosis not present

## 2012-11-14 DIAGNOSIS — C773 Secondary and unspecified malignant neoplasm of axilla and upper limb lymph nodes: Secondary | ICD-10-CM | POA: Insufficient documentation

## 2012-11-14 DIAGNOSIS — D0590 Unspecified type of carcinoma in situ of unspecified breast: Secondary | ICD-10-CM | POA: Diagnosis not present

## 2012-11-14 DIAGNOSIS — Z79899 Other long term (current) drug therapy: Secondary | ICD-10-CM | POA: Insufficient documentation

## 2012-11-14 HISTORY — DX: Malignant neoplasm of unspecified site of unspecified female breast: C50.919

## 2012-11-14 HISTORY — PX: MASTECTOMY W/ SENTINEL NODE BIOPSY: SHX2001

## 2012-11-14 HISTORY — DX: Family history of other specified conditions: Z84.89

## 2012-11-14 LAB — CBC
HCT: 37.1 % (ref 36.0–46.0)
MCH: 29 pg (ref 26.0–34.0)
MCV: 88.3 fL (ref 78.0–100.0)
RDW: 13.3 % (ref 11.5–15.5)
WBC: 11.2 10*3/uL — ABNORMAL HIGH (ref 4.0–10.5)

## 2012-11-14 LAB — CREATININE, SERUM: GFR calc Af Amer: >90 mL/min (ref >90)

## 2012-11-14 SURGERY — MASTECTOMY WITH SENTINEL LYMPH NODE BIOPSY
Anesthesia: General | Site: Breast | Laterality: Left | Wound class: Clean

## 2012-11-14 MED ORDER — DEXAMETHASONE SODIUM PHOSPHATE 4 MG/ML IJ SOLN
INTRAMUSCULAR | Status: DC | PRN
  Administered 2012-11-14: 8 mg via INTRAVENOUS

## 2012-11-14 MED ORDER — ONDANSETRON HCL 4 MG PO TABS: 4.0000 mg | ORAL_TABLET | Freq: Four times a day (QID) | ORAL | Status: DC | PRN

## 2012-11-14 MED ORDER — HYDROCODONE-ACETAMINOPHEN 5-325 MG PO TABS: 1.0000 | ORAL_TABLET | ORAL | Status: DC | PRN

## 2012-11-14 MED ORDER — HYDROMORPHONE HCL PF 1 MG/ML IJ SOLN
INTRAMUSCULAR | Status: DC | PRN
  Administered 2012-11-14 (×2): 0.5 mg via INTRAVENOUS

## 2012-11-14 MED ORDER — LIDOCAINE HCL (CARDIAC) 20 MG/ML IV SOLN
INTRAVENOUS | Status: DC | PRN
  Administered 2012-11-14: 80 mg via INTRAVENOUS

## 2012-11-14 MED ORDER — HYDROMORPHONE HCL PF 1 MG/ML IJ SOLN
0.2500 mg | INTRAMUSCULAR | Status: DC | PRN
  Administered 2012-11-14: 0.5 mg via INTRAVENOUS

## 2012-11-14 MED ORDER — ENOXAPARIN SODIUM 30 MG/0.3ML ~~LOC~~ SOLN
40.0000 mg | SUBCUTANEOUS | Status: DC
  Filled 2012-11-14: qty 0.4

## 2012-11-14 MED ORDER — CITALOPRAM HYDROBROMIDE 40 MG PO TABS
40.0000 mg | ORAL_TABLET | Freq: Every day | ORAL | Status: DC
Start: 2012-11-15 — End: 2012-11-15
  Administered 2012-11-15: 40 mg via ORAL
  Filled 2012-11-14: qty 1

## 2012-11-14 MED ORDER — MIDAZOLAM HCL 2 MG/2ML IJ SOLN
INTRAMUSCULAR | Status: AC
  Administered 2012-11-14: 2 mg via INTRAVENOUS
  Filled 2012-11-14: qty 2

## 2012-11-14 MED ORDER — KETOROLAC TROMETHAMINE 15 MG/ML IJ SOLN
15.0000 mg | Freq: Four times a day (QID) | INTRAMUSCULAR | Status: DC
  Administered 2012-11-14 – 2012-11-15 (×3): 15 mg via INTRAVENOUS
  Filled 2012-11-14 (×3): qty 1

## 2012-11-14 MED ORDER — SODIUM CHLORIDE 0.9 % IJ SOLN
INTRAMUSCULAR | Status: DC | PRN
  Administered 2012-11-14: 13:00:00 via INTRAMUSCULAR

## 2012-11-14 MED ORDER — ACETAMINOPHEN 10 MG/ML IV SOLN: 1000.0000 mg | Freq: Once | INTRAVENOUS | Status: DC

## 2012-11-14 MED ORDER — PROPOFOL 10 MG/ML IV BOLUS
INTRAVENOUS | Status: DC | PRN
  Administered 2012-11-14: 20 mg via INTRAVENOUS
  Administered 2012-11-14: 200 mg via INTRAVENOUS
  Administered 2012-11-14 (×2): 50 mg via INTRAVENOUS

## 2012-11-14 MED ORDER — HYDROMORPHONE HCL PF 1 MG/ML IJ SOLN
INTRAMUSCULAR | Status: AC
  Filled 2012-11-14: qty 1

## 2012-11-14 MED ORDER — ONDANSETRON HCL 4 MG/2ML IJ SOLN
INTRAMUSCULAR | Status: DC | PRN
  Administered 2012-11-14: 4 mg via INTRAVENOUS

## 2012-11-14 MED ORDER — METHYLENE BLUE 1 % INJ SOLN
INTRAMUSCULAR | Status: AC
  Filled 2012-11-14: qty 10

## 2012-11-14 MED ORDER — KCL IN DEXTROSE-NACL 20-5-0.45 MEQ/L-%-% IV SOLN
INTRAVENOUS | Status: DC
  Administered 2012-11-14 – 2012-11-15 (×2): via INTRAVENOUS
  Filled 2012-11-14 (×3): qty 1000

## 2012-11-14 MED ORDER — DEXTROSE 5 % IV SOLN
INTRAVENOUS | Status: DC | PRN
  Administered 2012-11-14 (×2): via INTRAVENOUS

## 2012-11-14 MED ORDER — EPHEDRINE SULFATE 50 MG/ML IJ SOLN
INTRAMUSCULAR | Status: DC | PRN
  Administered 2012-11-14: 10 mg via INTRAVENOUS

## 2012-11-14 MED ORDER — FENTANYL CITRATE 0.05 MG/ML IJ SOLN: 50.0000 ug | INTRAMUSCULAR | Status: DC | PRN

## 2012-11-14 MED ORDER — BUPIVACAINE-EPINEPHRINE 0.5% -1:200000 IJ SOLN
INTRAMUSCULAR | Status: DC | PRN
  Administered 2012-11-14: 40 mL

## 2012-11-14 MED ORDER — BUPIVACAINE-EPINEPHRINE PF 0.5-1:200000 % IJ SOLN
INTRAMUSCULAR | Status: AC
  Filled 2012-11-14: qty 60

## 2012-11-14 MED ORDER — LACTATED RINGERS IV SOLN
INTRAVENOUS | Status: DC | PRN
  Administered 2012-11-14 (×2): via INTRAVENOUS

## 2012-11-14 MED ORDER — MEPERIDINE HCL 25 MG/ML IJ SOLN: 6.2500 mg | INTRAMUSCULAR | Status: DC | PRN

## 2012-11-14 MED ORDER — ACETAMINOPHEN 10 MG/ML IV SOLN
INTRAVENOUS | Status: DC | PRN
  Administered 2012-11-14: 1000 mg via INTRAVENOUS

## 2012-11-14 MED ORDER — OXYCODONE HCL 5 MG PO TABS: 5.0000 mg | ORAL_TABLET | Freq: Once | ORAL | Status: DC | PRN

## 2012-11-14 MED ORDER — ONDANSETRON HCL 4 MG/2ML IJ SOLN: 4.0000 mg | Freq: Four times a day (QID) | INTRAMUSCULAR | Status: DC | PRN

## 2012-11-14 MED ORDER — HYDROCODONE-ACETAMINOPHEN 5-325 MG PO TABS
1.0000 | ORAL_TABLET | ORAL | Status: DC | PRN
  Administered 2012-11-14 – 2012-11-15 (×3): 2 via ORAL
  Filled 2012-11-14 (×3): qty 2

## 2012-11-14 MED ORDER — MORPHINE SULFATE 2 MG/ML IJ SOLN
1.0000 mg | INTRAMUSCULAR | Status: DC | PRN
  Administered 2012-11-14: 2 mg via INTRAVENOUS
  Filled 2012-11-14: qty 1

## 2012-11-14 MED ORDER — FENTANYL CITRATE 0.05 MG/ML IJ SOLN
INTRAMUSCULAR | Status: DC | PRN
  Administered 2012-11-14: 50 ug via INTRAVENOUS
  Administered 2012-11-14: 25 ug via INTRAVENOUS
  Administered 2012-11-14: 50 ug via INTRAVENOUS

## 2012-11-14 MED ORDER — OXYCODONE HCL 5 MG/5ML PO SOLN: 5.0000 mg | Freq: Once | ORAL | Status: DC | PRN

## 2012-11-14 MED ORDER — LACTATED RINGERS IV SOLN: INTRAVENOUS | Status: DC

## 2012-11-14 MED ORDER — MIDAZOLAM HCL 2 MG/2ML IJ SOLN: 1.0000 mg | INTRAMUSCULAR | Status: DC | PRN

## 2012-11-14 MED ORDER — FENTANYL CITRATE 0.05 MG/ML IJ SOLN
INTRAMUSCULAR | Status: AC
  Administered 2012-11-14: 100 ug via INTRAVENOUS
  Filled 2012-11-14: qty 2

## 2012-11-14 MED ORDER — LACTATED RINGERS IV SOLN
INTRAVENOUS | Status: DC
  Administered 2012-11-14: 50 mL/h via INTRAVENOUS

## 2012-11-14 MED ORDER — CEFAZOLIN SODIUM 1-5 GM-% IV SOLN
1.0000 g | Freq: Four times a day (QID) | INTRAVENOUS | Status: AC
  Administered 2012-11-14 – 2012-11-15 (×3): 1 g via INTRAVENOUS
  Filled 2012-11-14 (×3): qty 50

## 2012-11-14 MED ORDER — CEFAZOLIN SODIUM 1-5 GM-% IV SOLN
INTRAVENOUS | Status: AC
  Filled 2012-11-14: qty 100

## 2012-11-14 MED ORDER — TECHNETIUM TC 99M SULFUR COLLOID FILTERED
1.0000 | Freq: Once | INTRAVENOUS | Status: AC | PRN
  Administered 2012-11-14: 1 via INTRADERMAL

## 2012-11-14 MED ORDER — SODIUM CHLORIDE 0.9 % IJ SOLN
INTRAMUSCULAR | Status: DC | PRN
  Administered 2012-11-14 (×6): 10 mL

## 2012-11-14 MED ORDER — KETOROLAC TROMETHAMINE 15 MG/ML IJ SOLN
15.0000 mg | Freq: Four times a day (QID) | INTRAMUSCULAR | Status: DC | PRN
  Filled 2012-11-14: qty 1

## 2012-11-14 MED ORDER — CEFAZOLIN SODIUM 1-5 GM-% IV SOLN
1.0000 g | Freq: Once | INTRAVENOUS | Status: AC
  Administered 2012-11-14: 2 g via INTRAVENOUS

## 2012-11-14 SURGICAL SUPPLY — 61 items
APPLIER CLIP 9.375 MED OPEN (MISCELLANEOUS)
BINDER BREAST LRG (GAUZE/BANDAGES/DRESSINGS) ×2 IMPLANT
BINDER BREAST XLRG (GAUZE/BANDAGES/DRESSINGS) IMPLANT
BNDG COHESIVE 4X5 TAN STRL (GAUZE/BANDAGES/DRESSINGS) ×2 IMPLANT
CANISTER SUCTION 2500CC (MISCELLANEOUS) ×2 IMPLANT
CHLORAPREP W/TINT 26ML (MISCELLANEOUS) ×2 IMPLANT
CLIP APPLIE 9.375 MED OPEN (MISCELLANEOUS) IMPLANT
CLIP TI MEDIUM 6 (CLIP) ×6 IMPLANT
CLIP TI WIDE RED SMALL 6 (CLIP) ×2 IMPLANT
CLOTH BEACON ORANGE TIMEOUT ST (SAFETY) ×2 IMPLANT
CONT SPEC 4OZ CLIKSEAL STRL BL (MISCELLANEOUS) ×8 IMPLANT
COVER SURGICAL LIGHT HANDLE (MISCELLANEOUS) ×2 IMPLANT
COVER TRANSDUCER ULTRASND GEL (DRAPE) ×2 IMPLANT
DERMABOND ADVANCED (GAUZE/BANDAGES/DRESSINGS)
DERMABOND ADVANCED .7 DNX12 (GAUZE/BANDAGES/DRESSINGS) IMPLANT
DRAIN CHANNEL 19F RND (DRAIN) ×2 IMPLANT
DRAPE PROXIMA HALF (DRAPES) ×2 IMPLANT
DRAPE UTILITY 15X26 W/TAPE STR (DRAPE) ×4 IMPLANT
DRSG PAD ABDOMINAL 8X10 ST (GAUZE/BANDAGES/DRESSINGS) ×2 IMPLANT
ELECT CAUTERY BLADE 6.4 (BLADE) ×2 IMPLANT
ELECT REM PT RETURN 9FT ADLT (ELECTROSURGICAL) ×2
ELECTRODE REM PT RTRN 9FT ADLT (ELECTROSURGICAL) ×1 IMPLANT
EVACUATOR SILICONE 100CC (DRAIN) ×2 IMPLANT
GLOVE BIO SURGEON STRL SZ 6 (GLOVE) ×2 IMPLANT
GLOVE BIOGEL PI IND STRL 6.5 (GLOVE) ×1 IMPLANT
GLOVE BIOGEL PI IND STRL 7.5 (GLOVE) ×1 IMPLANT
GLOVE BIOGEL PI INDICATOR 6.5 (GLOVE) ×1
GLOVE BIOGEL PI INDICATOR 7.5 (GLOVE) ×1
GLOVE ECLIPSE 7.5 STRL STRAW (GLOVE) ×2 IMPLANT
GLOVE SURG SS PI 6.5 STRL IVOR (GLOVE) ×2 IMPLANT
GLOVE SURG SS PI 7.0 STRL IVOR (GLOVE) ×2 IMPLANT
GOWN PREVENTION PLUS XXLARGE (GOWN DISPOSABLE) ×2 IMPLANT
GOWN STRL NON-REIN LRG LVL3 (GOWN DISPOSABLE) ×4 IMPLANT
KIT BASIN OR (CUSTOM PROCEDURE TRAY) ×2 IMPLANT
KIT ROOM TURNOVER OR (KITS) ×2 IMPLANT
MARKER SKIN DUAL TIP RULER LAB (MISCELLANEOUS) ×2 IMPLANT
NEEDLE 18GX1X1/2 (RX/OR ONLY) (NEEDLE) ×2 IMPLANT
NEEDLE HYPO 25GX1X1/2 BEV (NEEDLE) ×2 IMPLANT
NEEDLE SPNL 22GX3.5 QUINCKE BK (NEEDLE) ×2 IMPLANT
NS IRRIG 1000ML POUR BTL (IV SOLUTION) ×2 IMPLANT
PACK GENERAL/GYN (CUSTOM PROCEDURE TRAY) ×2 IMPLANT
PACK UNIVERSAL I (CUSTOM PROCEDURE TRAY) ×2 IMPLANT
PAD ARMBOARD 7.5X6 YLW CONV (MISCELLANEOUS) ×4 IMPLANT
SPECIMEN JAR LARGE (MISCELLANEOUS) ×2 IMPLANT
SPECIMEN JAR X LARGE (MISCELLANEOUS) IMPLANT
SPONGE GAUZE 4X4 12PLY (GAUZE/BANDAGES/DRESSINGS) IMPLANT
SPONGE LAP 18X18 X RAY DECT (DISPOSABLE) ×2 IMPLANT
STAPLER VISISTAT 35W (STAPLE) ×2 IMPLANT
STOCKINETTE IMPERVIOUS 9X36 MD (GAUZE/BANDAGES/DRESSINGS) ×2 IMPLANT
STRIP CLOSURE SKIN 1/2X4 (GAUZE/BANDAGES/DRESSINGS) ×2 IMPLANT
SUT ETHILON 2 0 FS 18 (SUTURE) ×2 IMPLANT
SUT MON AB 4-0 PC3 18 (SUTURE) ×4 IMPLANT
SUT SILK 2 0 (SUTURE) ×1
SUT SILK 2 0 FS (SUTURE) ×2 IMPLANT
SUT SILK 2-0 18XBRD TIE 12 (SUTURE) ×1 IMPLANT
SUT VIC AB 3-0 SH 8-18 (SUTURE) ×8 IMPLANT
SYR 50ML SLIP (SYRINGE) ×2 IMPLANT
SYR CONTROL 10ML LL (SYRINGE) ×2 IMPLANT
TAPE CLOTH SURG 6X10 WHT LF (GAUZE/BANDAGES/DRESSINGS) ×2 IMPLANT
TOWEL OR 17X24 6PK STRL BLUE (TOWEL DISPOSABLE) ×2 IMPLANT
TOWEL OR 17X26 10 PK STRL BLUE (TOWEL DISPOSABLE) ×2 IMPLANT

## 2012-11-14 NOTE — Telephone Encounter (Signed)
Talked with pt's daughter.  Appointment given for 4/28 at 9:45

## 2012-11-14 NOTE — Progress Notes (Signed)
Assisted pt to bathrm on arrival to room voided without difficulty

## 2012-11-14 NOTE — Interval H&P Note (Signed)
History and Physical Interval Note:  11/14/2012 12:09 PM  Cynthia Griffin  has presented today for surgery, with the diagnosis of left breast cancer  The various methods of treatment have been discussed with the patient and family. After consideration of risks, benefits and other options for treatment, the patient has consented to  Procedure(s): MASTECTOMY WITH SENTINEL LYMPH NODE BIOPSY (Left) as a surgical intervention .  The patient's history has been reviewed, patient examined, no change in status, stable for surgery.  I have reviewed the patient's chart and labs.  Questions were answered to the patient's satisfaction.     BYERLY,FAERA

## 2012-11-14 NOTE — H&P (View-Only) (Signed)
Chief complaint:  Left breast cancer  HISTORY: Patient is a 67-year-old female who has had some left-sided nipple retraction for around a year. She noticed this within a few weeks to months following her last mammogram. Her last mammogram before this one was normal.  Sure she had an abdominal region of around 1.4 cm with architectural distortion on the mammogram. The ultrasound did not clearly define the mass. She had a 4 cm area of abnormality on BS GI and MRI area of 7.4 cm. Biopsy was positive for invasive mammary carcinoma with lobular features. This was estrogen receptor positive, progesterone receptor negative, HER-2 negative, and Ki67 was 10%.  Her father had a history of bladder cancer and lung cancer. Her mother had basal carcinoma of the skin. She has no Jewish ancestry. She has one sibling that does not have cancer. She has 2 children and is a former smoker. She drinks around fiberglass wine per week. She had menarche at age 13 and was postmenopausal following her hysterectomy with tube nephrectomy. She had 2 children starting at age 23 and was on hormone contraception for 9 years. She has had a colonoscopy around 11 years ago.  Past Medical History  Diagnosis Date  . Breast cancer   . Arthritis   . Carpal tunnel syndrome of right wrist     Past Surgical History  Procedure Laterality Date  . Abdominal hysterectomy    . Appendectomy    . Tonsillectomy    . Meniscus repair      Right Knee   Meds: Celexa Fennel MVT Glucosamine Calcium citrate Biotin   ALL: sulfa.  Family History  Problem Relation Age of Onset  . Bladder Cancer Father   . Lung cancer Father      History   Social History  . Marital Status: Married    Spouse Name: N/A    Number of Children: N/A  . Years of Education: N/A   Social History Main Topics  . Smoking status: Former Smoker  . Smokeless tobacco: Not on file  . Alcohol Use: Yes  . Drug Use: No  . Sexually Active: Not on file   Other  Topics Concern  . Not on file   Social History Narrative  . No narrative on file     REVIEW OF SYSTEMS - PERTINENT POSITIVES ONLY: 12 point review of systems negative other than HPI and PMH except for right shoulder pain, inverted nipple, heartburn.  EXAM: Wt Readings from Last 3 Encounters:  11/02/12 158 lb 14.4 oz (72.077 kg)   Temp Readings from Last 3 Encounters:  11/02/12 98.5 F (36.9 C) Oral   BP Readings from Last 3 Encounters:  11/02/12 134/78   Pulse Readings from Last 3 Encounters:  11/02/12 71     Gen:  No acute distress.  Well nourished and well groomed.   Neurological: Alert and oriented to person, place, and time. Coordination normal.  Head: Normocephalic and atraumatic.  Eyes: Conjunctivae are normal. Pupils are equal, round, and reactive to light. No scleral icterus.  Neck: Normal range of motion. Neck supple. No tracheal deviation or thyromegaly present.  Cardiovascular: Normal rate, regular rhythm, normal heart sounds and intact distal pulses.  Exam reveals no gallop and no friction rub.  No murmur heard. Respiratory: Effort normal.  No respiratory distress. No chest wall tenderness. Breath sounds normal.  No wheezes, rales or rhonchi.  Breast:  Left breast with medial fullness and mass around 4 cm.  Left nipple retraction.  No   lymphadenopathy.  Right side normal.   GI: Soft. Bowel sounds are normal. The abdomen is soft and nontender.  There is no rebound and no guarding.  Musculoskeletal: Normal range of motion. Extremities are nontender.  Lymphadenopathy: No cervical, preauricular, postauricular or axillary adenopathy is present Skin: Skin is warm and dry. No rash noted. No diaphoresis. No erythema. No pallor. No clubbing, cyanosis, or edema.   Psychiatric: Normal mood and affect. Behavior is normal. Judgment and thought content normal.    LABORATORY RESULTS: Available labs are reviewed  Normal CBC  RADIOLOGY RESULTS: See E-Chart or I-Site for most  recent results.  Images and reports are reviewed.  MRI IMPRESSION:  Diffuse non mass-like enhancement measuring 7.4 cm in the inferior  aspect of the left breast, causing nipple retraction corresponding  well with the known invasive mammary carcinoma with lobular  features.   ASSESSMENT AND PLAN: Cancer of lower-inner quadrant of female breast Patient is a 67-year-old female with a new diagnosis of left breast cancer. Based on the size of this and the size of her breasts, she will require a mastectomy for acceptable cosmetic results.  She is also not a candidate for immediate reconstruction because she may require radiation. The decision for radiation would be based on her final pathology. We will go ahead and refer her for plastic surgery evaluation. I reviewed the risk of mastectomy with her. I discussed bleeding, infection, damage to adjacent structures, seroma, recurrent cancer, and cardiopulmonary issues. I discussed the recovery and pain management. I also discussed that she would not be able to be the primary caregiver for her mother for a minimum a week. I advised her that she would have a drain postoperatively and would need to stay overnight.  The patient also discussed contralateral prophylactic mastectomy. I advised her to meet with the plastic surgeon first and to consider this decision. We certainly can do that at the time of reconstruction.    Arleigh Odowd L Larraine Argo MD Surgical Oncology, General and Endocrine Surgery [facility]      Visit Diagnoses: 1. Cancer of lower-inner quadrant of female breast, left     Primary Care Physician: HANDE,VISHWANATH, MD Magrinat Moody   

## 2012-11-14 NOTE — Telephone Encounter (Signed)
This RN received call from pt stating she believes she was contacted on Friday 3/7 with another " Nicola Girt" lab results " that has the same date of birth "  Per inquiry this pt did not have labs drawn on 11/10/2012.  This RN reviewed with with pt demographics.  Noted pt with same name and DOB but SS# ending in [id number] was correct pt for lab call but needs to have demographics corrected.  This RN printed data and took to [facility] manager- Tiffany for correction.

## 2012-11-14 NOTE — Anesthesia Preprocedure Evaluation (Addendum)
Anesthesia Evaluation  Patient identified by MRN, date of birth, ID band Patient awake    Reviewed: Allergy & Precautions, H&P , NPO status , Patient's Chart, lab work & pertinent test results  History of Anesthesia Complications Negative for: history of anesthetic complications  Airway Mallampati: I TM Distance: >3 FB Neck ROM: full    Dental no notable dental hx. (+) Teeth Intact and Dental Advisory Given   Pulmonary neg pulmonary ROS,  breath sounds clear to auscultation  Pulmonary exam normal       Cardiovascular Exercise Tolerance: Good negative cardio ROS  IRhythm:regular Rate:Normal     Neuro/Psych  Neuromuscular disease negative neurological ROS  negative psych ROS   GI/Hepatic negative GI ROS, Neg liver ROS,   Endo/Other  negative endocrine ROS  Renal/GU negative Renal ROS  negative genitourinary   Musculoskeletal negative musculoskeletal ROS (+)   Abdominal Normal abdominal exam  (+)   Peds  Hematology negative hematology ROS (+)   Anesthesia Other Findings   Reproductive/Obstetrics negative OB ROS IMPRESSION: Diffuse non mass-like enhancement measuring 7.4 cm in the inferior aspect of the left breast, causing nipple retraction corresponding well with the known invasive mammary carcinoma with lobular features.   RECOMMENDATION: Treatment planning is recommended.   THREE-DIMENSIONAL MR IMAGE RENDERING ON INDEPENDENT WORKSTATION:   Three-dimensional MR images were rendered by post-processing of the original MR data on an independent workstation.  The three- dimensional MR images were interpreted, and findings were reported in the accompanying complete MRI report for this study.   BI-RADS CATEGORY 6:  Known biopsy-proven malignancy - appropriate action should be taken.     Original Report Authenticated By: Baird Lyons, M.D.                           Anesthesia  Physical Anesthesia Plan  ASA: I  Anesthesia Plan: General   Post-op Pain Management:    Induction: Intravenous  Airway Management Planned: LMA  Additional Equipment:   Intra-op Plan:   Post-operative Plan: Extubation in OR  Informed Consent: I have reviewed the patients History and Physical, chart, labs and discussed the procedure including the risks, benefits and alternatives for the proposed anesthesia with the patient or authorized representative who has indicated his/her understanding and acceptance.   Dental advisory given  Plan Discussed with: CRNA and Surgeon  Anesthesia Plan Comments:        Anesthesia Quick Evaluation

## 2012-11-14 NOTE — Anesthesia Procedure Notes (Signed)
Procedure Name: LMA Insertion Date/Time: 11/14/2012 12:52 PM Performed by: Tyrone Nine Pre-anesthesia Checklist: Patient identified, Timeout performed, Emergency Drugs available, Suction available and Patient being monitored Patient Re-evaluated:Patient Re-evaluated prior to inductionOxygen Delivery Method: Circle system utilized Preoxygenation: Pre-oxygenation with 100% oxygen Intubation Type: IV induction Ventilation: Mask ventilation without difficulty LMA: LMA inserted LMA Size: 4.0 Number of attempts: 1 Placement Confirmation: positive ETCO2 and breath sounds checked- equal and bilateral Tube secured with: Tape Dental Injury: Teeth and Oropharynx as per pre-operative assessment

## 2012-11-14 NOTE — Transfer of Care (Signed)
Immediate Anesthesia Transfer of Care Note  Patient: Cynthia Griffin  Procedure(s) Performed: Procedure(s): MASTECTOMY WITH SENTINEL LYMPH NODE BIOPSY (Left)  Patient Location: PACU  Anesthesia Type:General  Level of Consciousness: awake, alert , oriented and patient cooperative  Airway & Oxygen Therapy: Patient Spontanous Breathing and Patient connected to nasal cannula oxygen  Post-op Assessment: Report given to PACU RN and Post -op Vital signs reviewed and stable  Post vital signs: Reviewed and stable  Complications: No apparent anesthesia complications

## 2012-11-14 NOTE — Op Note (Signed)
Left Mastectomy with Sentinel Node Biopsy Procedure Note  Indications: This patient presents with history of Left breast cancer with clinically negative axillary lymph node exam.  Pre-operative Diagnosis: left breast cancer, [id number]  Post-operative Diagnosis: left breast cancer, same  Surgeon: Almond Lint   Assistant:  Myrtie Soman, RNFA  Anesthesia: General endotracheal anesthesia and Local anesthesia 0.25.% bupivacaine, with epinephrine  ASA Class: 2  Procedure Details  The patient was seen in the Holding Room. The risks, benefits, complications, treatment options, and expected outcomes were discussed with the patient. The possibilities of reaction to medication, pulmonary aspiration, bleeding, infection, the need for additional procedures, failure to diagnose a condition, and creating a complication requiring transfusion or operation were discussed with the patient. The patient concurred with the proposed plan, giving informed consent.  The site of surgery properly noted/marked. The patient was taken to Operating Room # 9, identified, and the procedure verified as left Mastectomy and Sentinel Node Biopsy. A Time Out was held and the above information confirmed.  The methylene blue was injected in the subareolar location.    After induction of anesthesia, the left arm, breast, and chest were prepped and draped in standard fashion.   The borders of the breast were identified and marked.  The incisions of the breast was drawn out to make sure incision lines were equidistant in length.  The nipple was retracted.   The superior incision was made with the #10 blade.  The marcaine/saline mixture was infiltrated into the superior flap.  Mastectomy hooks were used to provide elevation of the skin edges, and the curved Mayo scissors were used to create the mastectomy flaps.  The dissection was taken to the fascia of the pectoralis major.  The penetrating vessels were clipped.  The superior flap was  taken medially to the lateral sternal border, superiorly to the inferior border of the clavicle.  The inferior flap was similarly created, inferiorly to the inframammary fold and laterally to the border of the latissimus.  The breast was taken off including the pectoralis fascia and the axillary tail marked.    Using a hand-held gamma probe, axillary sentinel nodes were identified.  4 level 2 axillary sentinel nodes were removed and submitted to pathology.  The findings are below.  The lymphovascular channels were clipped with metal clips.        The wound was irrigated. One 19 Blake drains were placed laterally.   Hemostasis was achieved with cautery.  The wound was irrigated and closed with a 3-0 Vicryl deep dermal interrupted sutures and 4-0 Vicryl subcuticular closure in layers.    Sterile dressings were applied. At the end of the operation, all sponge, instrument, and needle counts were correct.  Findings: grossly clear surgical margins, SLN #1 hot/blue cps 410, SLN #2 hot/blue cps 90, SLN #3 hot cps 150, SLN #4 CPS 60, background cps 9  Estimated Blood Loss:  less than 50 mL         Drains: 19 Fr Blake drain                Specimens: L breast and 4 axillary sentinel nodes         Complications:  None; patient tolerated the procedure well.         Disposition: PACU - hemodynamically stable.         Condition: stable

## 2012-11-14 NOTE — Preoperative (Signed)
Beta Blockers   Reason not to administer Beta Blockers:Not Applicable 

## 2012-11-14 NOTE — Anesthesia Postprocedure Evaluation (Signed)
Anesthesia Post Note  Patient: Cynthia Griffin  Procedure(s) Performed: Procedure(s) (LRB): MASTECTOMY WITH SENTINEL LYMPH NODE BIOPSY (Left)  Anesthesia type: General  Patient location: PACU  Post pain: Pain level controlled  Post assessment: Patient's Cardiovascular Status Stable  Last Vitals:  Filed Vitals:   11/14/12 1630  BP: 154/88  Pulse:   Temp:   Resp:     Post vital signs: Reviewed and stable  Level of consciousness: alert  Complications: No apparent anesthesia complications

## 2012-11-14 NOTE — OR Nursing (Signed)
Placed on PACU hold at 14:26. Removed at 15:02.  Oralia Manis, RN

## 2012-11-15 LAB — CBC
Hemoglobin: 11.3 g/dL — ABNORMAL LOW (ref 12.0–15.0)
MCH: 29.1 pg (ref 26.0–34.0)
MCHC: 33.4 g/dL (ref 30.0–36.0)
MCV: 87.1 fL (ref 78.0–100.0)
RBC: 3.88 MIL/uL (ref 3.87–5.11)

## 2012-11-15 LAB — BASIC METABOLIC PANEL
CO2: 29 mEq/L (ref 19–32)
Chloride: 106 mEq/L (ref 96–112)
Creatinine, Ser: 0.77 mg/dL (ref 0.50–1.10)
Glucose, Bld: 118 mg/dL — ABNORMAL HIGH (ref 70–99)

## 2012-11-15 NOTE — Discharge Summary (Signed)
Physician Discharge Summary  Patient ID: Cynthia Griffin MRN: [id number] DOB/AGE: 1946-06-02 67 y.o.  Admit date: 11/14/2012 Discharge date: 11/15/2012  Admission Diagnoses: Left breast cancer  Discharge Diagnoses:  Same  Discharged Condition: stable  Hospital Course:  Pt admitted to floor following left mastectomy and SLN bx.  She did well overnight.  Her pain was controlled with oral narcotics.  She was tolerating a regular diet.  She was ambulatory and was able to void spontaneously.  She was discharged to home in stable condition.    Consults: None  Significant Diagnostic Studies: none  Treatments: surgery: left mastectomy and SLN bx  Discharge Exam: Blood pressure 115/57, pulse 75, temperature 98.2 F (36.8 C), temperature source Oral, resp. rate 18, height 5\' 4"  (1.626 m), weight 164 lb 14.5 oz (74.8 kg), SpO2 99.00%. General appearance: alert and cooperative Resp: breathing comfortably Chest wall: left sided chest wall tenderness, Drains serosanguinous  Disposition: Final discharge disposition not confirmed  Discharge Orders   Future Appointments Provider Department Dept Phone   11/23/2012 3:45 PM Almond Lint, MD [facility] Surgery, [place] [phone]   12/07/2012 3:00 PM Lowella Dell, MD [facility] MEDICAL ONCOLOGY [phone]   Future Orders Complete By Expires     Diet - low sodium heart healthy  As directed     Discharge instructions  As directed     Comments:      Measure and record drain output twice daily    Increase activity slowly  As directed         Medication List    TAKE these medications       Biotin 5000 MCG Caps  Take 5,000 mcg by mouth daily.     calcium citrate-vitamin D 315-200 MG-UNIT per tablet  Commonly known as:  CITRACAL+D  Take 1 tablet by mouth daily.     citalopram 40 MG tablet  Commonly known as:  CELEXA  Take 40 mg by mouth daily.     GLUCOSAMINE CHONDR 1500 COMPLX PO  Take 1 capsule by mouth daily.     HYDROcodone-acetaminophen 5-325 MG per tablet  Commonly known as:  NORCO  Take 1-2 tablets by mouth every 4 (four) hours as needed for pain.     multivitamin with minerals Tabs  Take 1 tablet by mouth daily.           Follow-up Information   Follow up with [facility], MD On 11/23/2012. (3:45 pm)    Contact information:   [address] [place] [address] [phone]       Signed: Almond Lint 11/15/2012, 9:32 AM

## 2012-11-16 ENCOUNTER — Encounter (HOSPITAL_COMMUNITY): Payer: Self-pay | Admitting: General Surgery

## 2012-11-18 ENCOUNTER — Telehealth (INDEPENDENT_AMBULATORY_CARE_PROVIDER_SITE_OTHER): Payer: Self-pay | Admitting: General Surgery

## 2012-11-18 NOTE — Telephone Encounter (Signed)
At the patient's request, I discussed her pathology report with her on the phone. 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 ER positive. TNM stage T3, N2a.  Have told her that decisions will need to be made about adjuvant chemotherapy and adjuvant radiation therapy.  She will see Dr. Donell Beers next week for wound and drain check. She has an appointment Dr. Darnelle Catalan coming up as well.   Cynthia Griffin. Derrell Lolling, M.D., [facility] General and Minimally invasive Surgery Breast and Colorectal Surgery Office:   [phone] Pager:   [phone]

## 2012-11-21 ENCOUNTER — Other Ambulatory Visit (INDEPENDENT_AMBULATORY_CARE_PROVIDER_SITE_OTHER): Payer: Self-pay | Admitting: General Surgery

## 2012-11-21 ENCOUNTER — Telehealth (INDEPENDENT_AMBULATORY_CARE_PROVIDER_SITE_OTHER): Payer: Self-pay | Admitting: General Surgery

## 2012-11-21 NOTE — Telephone Encounter (Signed)
Patient needs an RX for post mastectomy supplies dated 11/15/2012 faxed to [employer] attnVerlon Au at [phone].

## 2012-11-22 ENCOUNTER — Other Ambulatory Visit: Payer: Self-pay | Admitting: Oncology

## 2012-11-23 ENCOUNTER — Ambulatory Visit (INDEPENDENT_AMBULATORY_CARE_PROVIDER_SITE_OTHER): Payer: Medicare Other | Admitting: General Surgery

## 2012-11-23 ENCOUNTER — Encounter (INDEPENDENT_AMBULATORY_CARE_PROVIDER_SITE_OTHER): Payer: Self-pay | Admitting: General Surgery

## 2012-11-23 ENCOUNTER — Other Ambulatory Visit (INDEPENDENT_AMBULATORY_CARE_PROVIDER_SITE_OTHER): Payer: Self-pay

## 2012-11-23 VITALS — BP 122/68 | HR 76 | Temp 97.0°F | Resp 16 | Ht 64.75 in | Wt 155.0 lb

## 2012-11-23 DIAGNOSIS — C50319 Malignant neoplasm of lower-inner quadrant of unspecified female breast: Secondary | ICD-10-CM

## 2012-11-23 DIAGNOSIS — C50912 Malignant neoplasm of unspecified site of left female breast: Secondary | ICD-10-CM

## 2012-11-23 DIAGNOSIS — C50312 Malignant neoplasm of lower-inner quadrant of left female breast: Secondary | ICD-10-CM

## 2012-11-23 NOTE — Patient Instructions (Signed)
Follow up at surgery

## 2012-11-23 NOTE — Assessment & Plan Note (Signed)
Unfortunately pt has stage III disease.   Have pt on schedule for completion ALND and port a cath. Will place on right due to likelihood of radiation on left.    Reviewed risks of port a cath and ALND with pt including bleeding, infection, damage to adjacent structures, nerve damage/numbness/tingling.

## 2012-11-23 NOTE — Progress Notes (Signed)
HISTORY: Pt is 1-2 weeks s/p left simple mastectomy with SLN bx.  Unfortunately, her SLN bx was positive.  She has been doing well overall in terms of pain and mobility.  She is only taking a few narcotic pain pills/day.  She has no redness or drainage from the wound.      EXAM: General:  Alert and oriented. Incision:  No signs of infection.  Drain removed   PATHOLOGY: 1. Lymph node, sentinel, biopsy, Left axilla - ONE LYMPH NODE, POSITIVE FOR METASTATIC LOBULAR CARCINOMA (1/1). - EXTRACAPSULAR EXTENSION IS PRESENT. 2. Lymph node, sentinel, biopsy, Left axilla - ONE LYMPH NODE, POSITIVE FOR METASTATIC LOBULAR CARCINOMA (1/1). 3. Lymph node, sentinel, biopsy, Left axilla - ONE LYMPH NODE, POSITIVE FOR METASTATIC LOBULAR CARCINOMA (1/1). - EXTRACAPSULAR EXTENSION IS PRESENT. 4. Lymph node, sentinel, biopsy, Left axilla - ONE LYMPH NODE, POSITIVE FOR METASTATIC LOBULAR CARCINOMA (1/1). 5. Breast, simple mastectomy, Left - MULTIFOCAL INVASIVE GRADE I LOBULAR CARCINOMA WITH THE LARGEST TUMOR FOCUS MEASURING 5.2 CM IN GREATEST DIMENSION. - ASSOCIATED LOBULAR CARCINOMA IN SITU. - TUMOR INVOLVES DERMIS OF NIPPLE AREA. - MARGINS ARE NEGATIVE. - SEE ONCOLOGY TEMPLATE.   ASSESSMENT AND PLAN:   Cancer of lower-inner quadrant of female breast Unfortunately pt has stage III disease.   Have pt on schedule for completion ALND and port a cath. Will place on right due to likelihood of radiation on left.    Reviewed risks of port a cath and ALND with pt including bleeding, infection, damage to adjacent structures, nerve damage/numbness/tingling.        Emina Ribaudo L Jacarri Gesner, MD Surgical Oncology, General & Endocrine Surgery [facility]  HANDE,VISHWANATH, MD Vishwanath, Hande, MD    

## 2012-11-24 ENCOUNTER — Encounter (INDEPENDENT_AMBULATORY_CARE_PROVIDER_SITE_OTHER): Payer: Self-pay | Admitting: General Surgery

## 2012-11-24 ENCOUNTER — Telehealth (INDEPENDENT_AMBULATORY_CARE_PROVIDER_SITE_OTHER): Payer: Self-pay | Admitting: General Surgery

## 2012-11-24 NOTE — Telephone Encounter (Signed)
Spoke with patient she is aware of appt time for genetics 02/06/13 @2pm 

## 2012-11-25 ENCOUNTER — Encounter (HOSPITAL_BASED_OUTPATIENT_CLINIC_OR_DEPARTMENT_OTHER): Payer: Self-pay | Admitting: *Deleted

## 2012-11-25 NOTE — Progress Notes (Signed)
Had lt mastectomy 11/15/12-snbx with positive node-no labs needed To bring all meds and overnight bag-

## 2012-11-28 ENCOUNTER — Telehealth (INDEPENDENT_AMBULATORY_CARE_PROVIDER_SITE_OTHER): Payer: Self-pay

## 2012-11-28 NOTE — Telephone Encounter (Signed)
Pt calling to request 2 written rx's for [employer] that needs to be faxed to fx#[phone]. The first rx needs to be dated for 11/15/12 for two camisoles. The second rx needs to be dated for 11/28/12 for masectomy bra's and prosthesis.

## 2012-11-28 NOTE — Telephone Encounter (Signed)
Confirmed two new Rx's need to be faxed for bras, prosthesis and camisoles.  Faxed by this CMA this date.

## 2012-11-30 ENCOUNTER — Ambulatory Visit (INDEPENDENT_AMBULATORY_CARE_PROVIDER_SITE_OTHER): Payer: Medicare Other | Admitting: General Surgery

## 2012-12-01 ENCOUNTER — Ambulatory Visit (HOSPITAL_BASED_OUTPATIENT_CLINIC_OR_DEPARTMENT_OTHER)
Admission: RE | Admit: 2012-12-01 | Discharge: 2012-12-02 | Disposition: A | Discharge status: Home / Self Care | Payer: Medicare Other | Source: Ambulatory Visit | Attending: General Surgery | Admitting: General Surgery

## 2012-12-01 ENCOUNTER — Ambulatory Visit (HOSPITAL_COMMUNITY): Payer: Medicare Other

## 2012-12-01 ENCOUNTER — Ambulatory Visit (HOSPITAL_BASED_OUTPATIENT_CLINIC_OR_DEPARTMENT_OTHER): Payer: Medicare Other | Admitting: Anesthesiology

## 2012-12-01 ENCOUNTER — Encounter (HOSPITAL_BASED_OUTPATIENT_CLINIC_OR_DEPARTMENT_OTHER): Payer: Self-pay | Admitting: Anesthesiology

## 2012-12-01 ENCOUNTER — Encounter (HOSPITAL_BASED_OUTPATIENT_CLINIC_OR_DEPARTMENT_OTHER)
Admission: RE | Disposition: A | Discharge status: Home / Self Care | Payer: Self-pay | Source: Ambulatory Visit | Attending: General Surgery

## 2012-12-01 ENCOUNTER — Encounter (HOSPITAL_BASED_OUTPATIENT_CLINIC_OR_DEPARTMENT_OTHER): Payer: Self-pay | Admitting: *Deleted

## 2012-12-01 DIAGNOSIS — R0602 Shortness of breath: Secondary | ICD-10-CM | POA: Diagnosis not present

## 2012-12-01 DIAGNOSIS — C50319 Malignant neoplasm of lower-inner quadrant of unspecified female breast: Secondary | ICD-10-CM | POA: Insufficient documentation

## 2012-12-01 DIAGNOSIS — Z87891 Personal history of nicotine dependence: Secondary | ICD-10-CM | POA: Diagnosis not present

## 2012-12-01 DIAGNOSIS — Z79899 Other long term (current) drug therapy: Secondary | ICD-10-CM | POA: Diagnosis not present

## 2012-12-01 DIAGNOSIS — F411 Generalized anxiety disorder: Secondary | ICD-10-CM | POA: Diagnosis present

## 2012-12-01 DIAGNOSIS — Z8052 Family history of malignant neoplasm of bladder: Secondary | ICD-10-CM | POA: Diagnosis not present

## 2012-12-01 DIAGNOSIS — J982 Interstitial emphysema: Secondary | ICD-10-CM | POA: Diagnosis not present

## 2012-12-01 DIAGNOSIS — R079 Chest pain, unspecified: Secondary | ICD-10-CM | POA: Diagnosis not present

## 2012-12-01 DIAGNOSIS — G5600 Carpal tunnel syndrome, unspecified upper limb: Secondary | ICD-10-CM | POA: Diagnosis not present

## 2012-12-01 DIAGNOSIS — C50919 Malignant neoplasm of unspecified site of unspecified female breast: Secondary | ICD-10-CM | POA: Diagnosis present

## 2012-12-01 DIAGNOSIS — R918 Other nonspecific abnormal finding of lung field: Secondary | ICD-10-CM | POA: Diagnosis not present

## 2012-12-01 DIAGNOSIS — Z4682 Encounter for fitting and adjustment of non-vascular catheter: Secondary | ICD-10-CM | POA: Diagnosis not present

## 2012-12-01 DIAGNOSIS — J9383 Other pneumothorax: Secondary | ICD-10-CM | POA: Diagnosis not present

## 2012-12-01 DIAGNOSIS — J90 Pleural effusion, not elsewhere classified: Secondary | ICD-10-CM | POA: Diagnosis not present

## 2012-12-01 DIAGNOSIS — C50312 Malignant neoplasm of lower-inner quadrant of left female breast: Secondary | ICD-10-CM

## 2012-12-01 DIAGNOSIS — J438 Other emphysema: Secondary | ICD-10-CM | POA: Diagnosis not present

## 2012-12-01 DIAGNOSIS — Z882 Allergy status to sulfonamides status: Secondary | ICD-10-CM | POA: Diagnosis not present

## 2012-12-01 DIAGNOSIS — Z9010 Acquired absence of unspecified breast and nipple: Secondary | ICD-10-CM | POA: Diagnosis not present

## 2012-12-01 DIAGNOSIS — J95811 Postprocedural pneumothorax: Secondary | ICD-10-CM | POA: Diagnosis not present

## 2012-12-01 DIAGNOSIS — K5900 Constipation, unspecified: Secondary | ICD-10-CM | POA: Diagnosis not present

## 2012-12-01 DIAGNOSIS — Z801 Family history of malignant neoplasm of trachea, bronchus and lung: Secondary | ICD-10-CM | POA: Diagnosis not present

## 2012-12-01 DIAGNOSIS — C773 Secondary and unspecified malignant neoplasm of axilla and upper limb lymph nodes: Secondary | ICD-10-CM | POA: Insufficient documentation

## 2012-12-01 DIAGNOSIS — J9819 Other pulmonary collapse: Secondary | ICD-10-CM | POA: Diagnosis not present

## 2012-12-01 DIAGNOSIS — C50019 Malignant neoplasm of nipple and areola, unspecified female breast: Secondary | ICD-10-CM | POA: Diagnosis not present

## 2012-12-01 HISTORY — PX: PORTACATH PLACEMENT: SHX2246

## 2012-12-01 HISTORY — PX: AXILLARY LYMPH NODE DISSECTION: SHX5229

## 2012-12-01 LAB — POCT HEMOGLOBIN-HEMACUE: Hemoglobin: 10.7 g/dL — ABNORMAL LOW (ref 12.0–15.0)

## 2012-12-01 SURGERY — LYMPHADENECTOMY, AXILLARY
Anesthesia: General | Site: Chest | Laterality: Right | Wound class: Clean

## 2012-12-01 MED ORDER — CITALOPRAM HYDROBROMIDE 40 MG PO TABS
40.0000 mg | ORAL_TABLET | Freq: Every day | ORAL | Status: DC
  Administered 2012-12-01: 40 mg via ORAL
  Filled 2012-12-01: qty 1

## 2012-12-01 MED ORDER — MIDAZOLAM HCL 2 MG/2ML IJ SOLN: 1.0000 mg | INTRAMUSCULAR | Status: DC | PRN

## 2012-12-01 MED ORDER — SUFENTANIL CITRATE 50 MCG/ML IV SOLN
INTRAVENOUS | Status: DC | PRN
  Administered 2012-12-01: 10 ug via INTRAVENOUS
  Administered 2012-12-01 (×3): 5 ug via INTRAVENOUS

## 2012-12-01 MED ORDER — OXYCODONE HCL 5 MG/5ML PO SOLN: 5.0000 mg | Freq: Once | ORAL | Status: DC | PRN

## 2012-12-01 MED ORDER — KETOROLAC TROMETHAMINE 15 MG/ML IJ SOLN
15.0000 mg | Freq: Four times a day (QID) | INTRAMUSCULAR | Status: DC | PRN
  Administered 2012-12-02: 15 mg via INTRAVENOUS

## 2012-12-01 MED ORDER — HEPARIN (PORCINE) IN NACL 2-0.9 UNIT/ML-% IJ SOLN
INTRAMUSCULAR | Status: DC | PRN
  Administered 2012-12-01: 1 via INTRAVENOUS

## 2012-12-01 MED ORDER — DEXAMETHASONE SODIUM PHOSPHATE 4 MG/ML IJ SOLN
INTRAMUSCULAR | Status: DC | PRN
  Administered 2012-12-01: 10 mg via INTRAVENOUS

## 2012-12-01 MED ORDER — KETOROLAC TROMETHAMINE 15 MG/ML IJ SOLN
15.0000 mg | Freq: Four times a day (QID) | INTRAMUSCULAR | Status: AC
  Administered 2012-12-01: 15 mg via INTRAVENOUS

## 2012-12-01 MED ORDER — CYCLOBENZAPRINE HCL 5 MG PO TABS: 5.0000 mg | ORAL_TABLET | Freq: Three times a day (TID) | ORAL | Status: DC | PRN

## 2012-12-01 MED ORDER — HYDROMORPHONE HCL PF 1 MG/ML IJ SOLN
0.2500 mg | INTRAMUSCULAR | Status: DC | PRN
  Administered 2012-12-01: 0.5 mg via INTRAVENOUS
  Administered 2012-12-01 (×2): 0.25 mg via INTRAVENOUS
  Administered 2012-12-01: 0.5 mg via INTRAVENOUS

## 2012-12-01 MED ORDER — ONDANSETRON HCL 4 MG/2ML IJ SOLN
INTRAMUSCULAR | Status: DC | PRN
  Administered 2012-12-01: 4 mg via INTRAVENOUS

## 2012-12-01 MED ORDER — HEPARIN SOD (PORK) LOCK FLUSH 100 UNIT/ML IV SOLN
INTRAVENOUS | Status: DC | PRN
  Administered 2012-12-01: 500 [IU] via INTRAVENOUS

## 2012-12-01 MED ORDER — MORPHINE SULFATE 2 MG/ML IJ SOLN: 1.0000 mg | INTRAMUSCULAR | Status: DC | PRN

## 2012-12-01 MED ORDER — CYCLOBENZAPRINE HCL 5 MG PO TABS
5.0000 mg | ORAL_TABLET | Freq: Three times a day (TID) | ORAL | Status: DC | PRN
  Administered 2012-12-01 – 2012-12-02 (×2): 5 mg via ORAL

## 2012-12-01 MED ORDER — LABETALOL HCL 5 MG/ML IV SOLN
INTRAVENOUS | Status: DC | PRN
  Administered 2012-12-01: 5 mg via INTRAVENOUS

## 2012-12-01 MED ORDER — ONDANSETRON HCL 4 MG/2ML IJ SOLN: 4.0000 mg | Freq: Once | INTRAMUSCULAR | Status: DC | PRN

## 2012-12-01 MED ORDER — HYDROCODONE-ACETAMINOPHEN 5-325 MG PO TABS
1.0000 | ORAL_TABLET | ORAL | Status: DC | PRN
  Administered 2012-12-01 – 2012-12-02 (×3): 2 via ORAL

## 2012-12-01 MED ORDER — HYDROCODONE-ACETAMINOPHEN 5-325 MG PO TABS: 1.0000 | ORAL_TABLET | ORAL | Status: DC | PRN

## 2012-12-01 MED ORDER — DIPHENHYDRAMINE HCL 25 MG PO CAPS: 25.0000 mg | ORAL_CAPSULE | Freq: Four times a day (QID) | ORAL | Status: DC | PRN

## 2012-12-01 MED ORDER — CEFAZOLIN SODIUM 1-5 GM-% IV SOLN
1.0000 g | Freq: Four times a day (QID) | INTRAVENOUS | Status: AC
  Administered 2012-12-01 – 2012-12-02 (×3): 1 g via INTRAVENOUS

## 2012-12-01 MED ORDER — FENTANYL CITRATE 0.05 MG/ML IJ SOLN: 50.0000 ug | INTRAMUSCULAR | Status: DC | PRN

## 2012-12-01 MED ORDER — KCL IN DEXTROSE-NACL 20-5-0.45 MEQ/L-%-% IV SOLN
INTRAVENOUS | Status: DC
  Administered 2012-12-01: 14:00:00 via INTRAVENOUS

## 2012-12-01 MED ORDER — CYCLOBENZAPRINE HCL 5 MG PO TABS
5.0000 mg | ORAL_TABLET | Freq: Three times a day (TID) | ORAL | Status: DC | PRN
  Filled 2012-12-01 (×3): qty 1

## 2012-12-01 MED ORDER — ENOXAPARIN SODIUM 40 MG/0.4ML ~~LOC~~ SOLN: 40.0000 mg | SUBCUTANEOUS | Status: DC

## 2012-12-01 MED ORDER — LACTATED RINGERS IV SOLN
INTRAVENOUS | Status: DC
Start: 2012-12-01 — End: 2012-12-01
  Administered 2012-12-01 (×3): via INTRAVENOUS

## 2012-12-01 MED ORDER — MIDAZOLAM HCL 5 MG/5ML IJ SOLN
INTRAMUSCULAR | Status: DC | PRN
  Administered 2012-12-01: 2 mg via INTRAVENOUS

## 2012-12-01 MED ORDER — ONDANSETRON HCL 4 MG PO TABS: 4.0000 mg | ORAL_TABLET | Freq: Four times a day (QID) | ORAL | Status: DC | PRN

## 2012-12-01 MED ORDER — ONDANSETRON HCL 4 MG/2ML IJ SOLN: 4.0000 mg | Freq: Four times a day (QID) | INTRAMUSCULAR | Status: DC | PRN

## 2012-12-01 MED ORDER — BUPIVACAINE-EPINEPHRINE 0.25% -1:200000 IJ SOLN
INTRAMUSCULAR | Status: DC | PRN
  Administered 2012-12-01: 9 mL

## 2012-12-01 MED ORDER — LIDOCAINE HCL (CARDIAC) 20 MG/ML IV SOLN
INTRAVENOUS | Status: DC | PRN
  Administered 2012-12-01: 100 mg via INTRAVENOUS

## 2012-12-01 MED ORDER — CEFAZOLIN SODIUM-DEXTROSE 2-3 GM-% IV SOLR
2.0000 g | INTRAVENOUS | Status: AC
  Administered 2012-12-01: 2 g via INTRAVENOUS

## 2012-12-01 MED ORDER — MEPERIDINE HCL 25 MG/ML IJ SOLN: 6.2500 mg | INTRAMUSCULAR | Status: DC | PRN

## 2012-12-01 MED ORDER — ACETAMINOPHEN 10 MG/ML IV SOLN: 1000.0000 mg | Freq: Four times a day (QID) | INTRAVENOUS | Status: DC

## 2012-12-01 MED ORDER — OXYCODONE HCL 5 MG PO TABS: 5.0000 mg | ORAL_TABLET | Freq: Once | ORAL | Status: DC | PRN

## 2012-12-01 SURGICAL SUPPLY — 77 items
APPLIER CLIP 11 MED OPEN (CLIP)
BAG DECANTER FOR FLEXI CONT (MISCELLANEOUS) ×3 IMPLANT
BANDAGE ELASTIC 6 VELCRO ST LF (GAUZE/BANDAGES/DRESSINGS) IMPLANT
BANDAGE GAUZE ELAST BULKY 4 IN (GAUZE/BANDAGES/DRESSINGS) ×3 IMPLANT
BLADE HEX COATED 2.75 (ELECTRODE) ×3 IMPLANT
BLADE SURG 10 STRL SS (BLADE) ×6 IMPLANT
BLADE SURG 11 STRL SS (BLADE) ×3 IMPLANT
BLADE SURG 15 STRL LF DISP TIS (BLADE) ×4 IMPLANT
BLADE SURG 15 STRL SS (BLADE) ×2
BLADE SURG ROTATE 9660 (MISCELLANEOUS) IMPLANT
CANISTER SUCTION 1200CC (MISCELLANEOUS) ×3 IMPLANT
CHLORAPREP W/TINT 26ML (MISCELLANEOUS) ×6 IMPLANT
CLIP APPLIE 11 MED OPEN (CLIP) IMPLANT
CLIP TI LARGE 6 (CLIP) IMPLANT
CLIP TI MEDIUM 6 (CLIP) ×6 IMPLANT
CLIP TI WIDE RED SMALL 6 (CLIP) ×9 IMPLANT
CLOTH BEACON ORANGE TIMEOUT ST (SAFETY) ×3 IMPLANT
COVER MAYO STAND STRL (DRAPES) ×3 IMPLANT
COVER TABLE BACK 60X90 (DRAPES) ×3 IMPLANT
DECANTER SPIKE VIAL GLASS SM (MISCELLANEOUS) IMPLANT
DERMABOND ADVANCED (GAUZE/BANDAGES/DRESSINGS) ×1
DERMABOND ADVANCED .7 DNX12 (GAUZE/BANDAGES/DRESSINGS) ×2 IMPLANT
DRAIN CHANNEL 19F RND (DRAIN) ×3 IMPLANT
DRAPE C-ARM 42X72 X-RAY (DRAPES) ×3 IMPLANT
DRAPE LAPAROTOMY TRNSV 102X78 (DRAPE) ×3 IMPLANT
DRAPE PED LAPAROTOMY (DRAPES) ×3 IMPLANT
DRAPE UTILITY XL STRL (DRAPES) ×3 IMPLANT
DRSG TEGADERM 4X4.75 (GAUZE/BANDAGES/DRESSINGS) IMPLANT
ELECT COATED BLADE 2.86 ST (ELECTRODE) IMPLANT
ELECT REM PT RETURN 9FT ADLT (ELECTROSURGICAL) ×3
ELECTRODE REM PT RTRN 9FT ADLT (ELECTROSURGICAL) ×2 IMPLANT
EVACUATOR SILICONE 100CC (DRAIN) ×3 IMPLANT
GLOVE BIO SURGEON STRL SZ 6 (GLOVE) ×3 IMPLANT
GLOVE BIO SURGEON STRL SZ8 (GLOVE) ×3 IMPLANT
GLOVE BIOGEL M STRL SZ7.5 (GLOVE) ×3 IMPLANT
GLOVE BIOGEL PI IND STRL 6.5 (GLOVE) ×4 IMPLANT
GLOVE BIOGEL PI IND STRL 7.5 (GLOVE) ×2 IMPLANT
GLOVE BIOGEL PI IND STRL 8 (GLOVE) ×2 IMPLANT
GLOVE BIOGEL PI INDICATOR 6.5 (GLOVE) ×2
GLOVE BIOGEL PI INDICATOR 7.5 (GLOVE) ×1
GLOVE BIOGEL PI INDICATOR 8 (GLOVE) ×1
GLOVE ECLIPSE 6.5 STRL STRAW (GLOVE) ×3 IMPLANT
GLOVE EXAM NITRILE MD LF STRL (GLOVE) ×3 IMPLANT
GOWN PREVENTION PLUS XLARGE (GOWN DISPOSABLE) ×6 IMPLANT
GOWN PREVENTION PLUS XXLARGE (GOWN DISPOSABLE) ×3 IMPLANT
IV HEPARIN 1000UNITS/500ML (IV SOLUTION) ×3 IMPLANT
KIT PORT POWER 8FR ISP CVUE (Catheter) ×3 IMPLANT
NEEDLE HYPO 25X1 1.5 SAFETY (NEEDLE) ×3 IMPLANT
NS IRRIG 1000ML POUR BTL (IV SOLUTION) ×3 IMPLANT
PACK BASIN DAY SURGERY FS (CUSTOM PROCEDURE TRAY) ×3 IMPLANT
PACK UNIVERSAL I (CUSTOM PROCEDURE TRAY) ×3 IMPLANT
PENCIL BUTTON HOLSTER BLD 10FT (ELECTRODE) ×3 IMPLANT
PIN SAFETY STERILE (MISCELLANEOUS) ×3 IMPLANT
SLEEVE SCD COMPRESS KNEE MED (MISCELLANEOUS) ×3 IMPLANT
SPONGE GAUZE 4X4 12PLY (GAUZE/BANDAGES/DRESSINGS) ×3 IMPLANT
SPONGE LAP 18X18 X RAY DECT (DISPOSABLE) ×3 IMPLANT
STAPLER VISISTAT 35W (STAPLE) ×3 IMPLANT
STOCKINETTE 6  STRL (DRAPES) ×1
STOCKINETTE 6 STRL (DRAPES) ×2 IMPLANT
STRIP CLOSURE SKIN 1/2X4 (GAUZE/BANDAGES/DRESSINGS) ×3 IMPLANT
SUT ETHILON 2 0 FS 18 (SUTURE) ×3 IMPLANT
SUT MNCRL AB 4-0 PS2 18 (SUTURE) ×3 IMPLANT
SUT MON AB 4-0 PC3 18 (SUTURE) ×3 IMPLANT
SUT PROLENE 2 0 SH DA (SUTURE) ×6 IMPLANT
SUT SILK 2 0 SH (SUTURE) ×3 IMPLANT
SUT VIC AB 3-0 54X BRD REEL (SUTURE) IMPLANT
SUT VIC AB 3-0 BRD 54 (SUTURE)
SUT VIC AB 3-0 SH 27 (SUTURE) ×1
SUT VIC AB 3-0 SH 27X BRD (SUTURE) ×2 IMPLANT
SUT VICRYL 3-0 CR8 SH (SUTURE) ×3 IMPLANT
SYR 5ML LUER SLIP (SYRINGE) ×3 IMPLANT
SYR BULB 3OZ (MISCELLANEOUS) IMPLANT
SYR CONTROL 10ML LL (SYRINGE) ×3 IMPLANT
TOWEL OR 17X24 6PK STRL BLUE (TOWEL DISPOSABLE) ×3 IMPLANT
TOWEL OR NON WOVEN STRL DISP B (DISPOSABLE) ×3 IMPLANT
TUBE CONNECTING 20X1/4 (TUBING) ×3 IMPLANT
YANKAUER SUCT BULB TIP NO VENT (SUCTIONS) ×3 IMPLANT

## 2012-12-01 NOTE — Interval H&P Note (Signed)
History and Physical Interval Note:  12/01/2012 7:25 AM  Cynthia Griffin  has presented today for surgery, with the diagnosis of left breast cancer  The various methods of treatment have been discussed with the patient and family. After consideration of risks, benefits and other options for treatment, the patient has consented to  Procedure(s): AXILLARY LYMPH NODE DISSECTION (Left) INSERTION PORT-A-CATH (N/A) as a surgical intervention .  The patient's history has been reviewed, patient examined, no change in status, stable for surgery.  I have reviewed the patient's chart and labs.  Questions were answered to the patient's satisfaction.     Khristopher Kapaun

## 2012-12-01 NOTE — Anesthesia Preprocedure Evaluation (Signed)
Anesthesia Evaluation  Patient identified by MRN, date of birth, ID band Patient awake    Reviewed: Allergy & Precautions, H&P , NPO status , Patient's Chart, lab work & pertinent test results  Airway Mallampati: I TM Distance: >3 FB Neck ROM: Full    Dental   Pulmonary          Cardiovascular     Neuro/Psych    GI/Hepatic   Endo/Other    Renal/GU      Musculoskeletal   Abdominal   Peds  Hematology   Anesthesia Other Findings   Reproductive/Obstetrics                           Anesthesia Physical Anesthesia Plan  ASA: II  Anesthesia Plan: General   Post-op Pain Management:    Induction: Intravenous  Airway Management Planned: LMA  Additional Equipment:   Intra-op Plan:   Post-operative Plan: Extubation in OR  Informed Consent: I have reviewed the patients History and Physical, chart, labs and discussed the procedure including the risks, benefits and alternatives for the proposed anesthesia with the patient or authorized representative who has indicated his/her understanding and acceptance.     Plan Discussed with: CRNA and Surgeon  Anesthesia Plan Comments:         Anesthesia Quick Evaluation  

## 2012-12-01 NOTE — Anesthesia Procedure Notes (Signed)
Procedure Name: LMA Insertion Date/Time: 12/01/2012 7:40 AM Performed by: Zenia Resides D Pre-anesthesia Checklist: Patient identified, Emergency Drugs available, Suction available and Patient being monitored Patient Re-evaluated:Patient Re-evaluated prior to inductionOxygen Delivery Method: Circle System Utilized Preoxygenation: Pre-oxygenation with 100% oxygen Intubation Type: IV induction Ventilation: Mask ventilation without difficulty LMA: LMA inserted LMA Size: 4.0 Number of attempts: 1 Airway Equipment and Method: bite block Placement Confirmation: positive ETCO2 Tube secured with: Tape Dental Injury: Teeth and Oropharynx as per pre-operative assessment

## 2012-12-01 NOTE — Anesthesia Postprocedure Evaluation (Signed)
Anesthesia Post Note  Patient: Cynthia Griffin  Procedure(s) Performed: Procedure(s) (LRB): AXILLARY LYMPH NODE DISSECTION (Left) INSERTION PORT-A-CATH (Right)  Anesthesia type: general  Patient location: PACU  Post pain: Pain level controlled  Post assessment: Patient's Cardiovascular Status Stable  Last Vitals:  Filed Vitals:   12/01/12 1345  BP: 140/75  Pulse: 91  Temp: 37.1 C  Resp: 16    Post vital signs: Reviewed and stable  Level of consciousness: sedated  Complications: No apparent anesthesia complications

## 2012-12-01 NOTE — Progress Notes (Signed)
Xray completed at this time. Pt tolerated well.  

## 2012-12-01 NOTE — Op Note (Signed)
PREOPERATIVE DIAGNOSIS:  Left breast cancer     POSTOPERATIVE DIAGNOSIS:  Same     PROCEDURE: Left axillary lymph node dissection, right subclavian ultrasound-guided port placement, Bard   ClearVue, MRI safe, 8-French.      SURGEON:  Almond Lint, MD   ASSISTANT:  Estelle Grumbles, MD     ANESTHESIA:  General   FINDINGS:  Good function of thoracodorsal nerve and long thoracic nerve.   Good venous return, easy flush, and tip of the catheter and   SVC 19 cm.      SPECIMEN:  Left axillary contents.     ESTIMATED BLOOD LOSS:  Minimal.      COMPLICATIONS:  None known.      PROCEDURE:  Pt was identified in the holding area and taken to   the operating room, where patient was placed supine on the operating room   table. The left chest was prepped and draped in sterile fashion.  Time out was performed according to the surgical safety checklist.  When all was correct, we continued.    The lateral aspect of the mastectomy incision was opened.  The prior scar tissue was carefully dissected.   An axillary dissection was performed with removal of the associated lymph nodes and surrounding adipose tissue. This included levels I and II. This was accomplished by exposing the axillary vein superiorly, and continued medially to the level of the pectoralis minor and laterally over the latissimus dorsi muscle. Posteriorly, the dissection continued to the subscapularis.  Small venous tributaries, lymphatics, and vessels were clipped and ligated or cauterized and divided.  The long thoracic and thoracodorsal neurovascular bundles were identified and preserved.  19 Fr Blake drain was placed and secured with a 2-0 Nylon.  The wound was irrigated and closed with a 3-0 Vicryl deep dermal interrupted and a 4-0 vicryl subcuticular closure in layers.  Dry sterile dressing was applied.  The patient was then changed position for the port a cath.    Patient's arms were tucked and the upper   chest and neck were prepped and  draped in sterile fashion.  Time-out was   performed according to the surgical safety check list.  When all was   correct, we continued.   Local anesthetic was administered over this   area at the angle of the clavicle.  The vein was accessed with 2 passes of the needle. There was good venous return and the wire passed easily with no ectopy.  Fluoroscopy was used to confirm that the wire was in the vena cava.      The patient was placed back level and the area for the pocket was anethetized   with local anesthetic.  A 3-cm transverse incision was made with a #15   blade.  Cautery was used to divide the subcutaneous tissues down to the   pectoralis muscle.  An [employer] retractor was used to elevate the skin   while a pocket was created on top of the pectoralis fascia.  The port   was placed into the pocket to confirm that it was of adequate size.  The   catheter was preattached to the port.  The port was then secured to the   pectoralis fascia with four 2-0 Prolene sutures.  These were clamped and   not tied down yet.    The catheter was tunneled through to the wire exit   site.  The catheter was placed along the wire to determine what length it should be  to be in the SVC.  The catheter was cut at 19 cm.  The tunneler sheath and dilator were passed over the wire and the dilator and wire were removed.  The catheter was advanced through the tunneler sheath and the tunneler sheath was pulled away.  Care was taken to keep the catheter in the tunneler sheath as this occurred. This was advanced and the tunneler sheath was removed.  There was good venous return and easy flush of the catheter.  The Prolene sutures were tied  down to the pectoral fascia.  The skin was reapproximated using 3-0 Vicryl interrupted deep dermal sutures.    Fluoroscopy was used to re-confirm good position of the catheter.  The skin  was then closed using 4-0 Monocryl in a subcuticular fashion.  The port was flushed with  concentrated heparin flush as well.  The wounds were then cleaned, dried, and dressed with Dermabond.  The patient was awakened from anesthesia and taken to the PACU in stable condition.  Needle, sponge, and instrument counts were correct.         Almond Lint, MD

## 2012-12-01 NOTE — H&P (View-Only) (Signed)
HISTORY: Pt is 1-2 weeks s/p left simple mastectomy with SLN bx.  Unfortunately, her SLN bx was positive.  She has been doing well overall in terms of pain and mobility.  She is only taking a few narcotic pain pills/day.  She has no redness or drainage from the wound.      EXAM: General:  Alert and oriented. Incision:  No signs of infection.  Drain removed   PATHOLOGY: 1. Lymph node, sentinel, biopsy, Left axilla - ONE LYMPH NODE, POSITIVE FOR METASTATIC LOBULAR CARCINOMA (1/1). - EXTRACAPSULAR EXTENSION IS PRESENT. 2. Lymph node, sentinel, biopsy, Left axilla - ONE LYMPH NODE, POSITIVE FOR METASTATIC LOBULAR CARCINOMA (1/1). 3. Lymph node, sentinel, biopsy, Left axilla - ONE LYMPH NODE, POSITIVE FOR METASTATIC LOBULAR CARCINOMA (1/1). - EXTRACAPSULAR EXTENSION IS PRESENT. 4. Lymph node, sentinel, biopsy, Left axilla - ONE LYMPH NODE, POSITIVE FOR METASTATIC LOBULAR CARCINOMA (1/1). 5. Breast, simple mastectomy, Left - MULTIFOCAL INVASIVE GRADE I LOBULAR CARCINOMA WITH THE LARGEST TUMOR FOCUS MEASURING 5.2 CM IN GREATEST DIMENSION. - ASSOCIATED LOBULAR CARCINOMA IN SITU. - TUMOR INVOLVES DERMIS OF NIPPLE AREA. - MARGINS ARE NEGATIVE. - SEE ONCOLOGY TEMPLATE.   ASSESSMENT AND PLAN:   Cancer of lower-inner quadrant of female breast Unfortunately pt has stage III disease.   Have pt on schedule for completion ALND and port a cath. Will place on right due to likelihood of radiation on left.    Reviewed risks of port a cath and ALND with pt including bleeding, infection, damage to adjacent structures, nerve damage/numbness/tingling.        Maudry Diego, MD Surgical Oncology, General & Endocrine Surgery [facility]  Barbette Reichmann, MD Barbette Reichmann, MD

## 2012-12-01 NOTE — Transfer of Care (Signed)
Immediate Anesthesia Transfer of Care Note  Patient: Cynthia Griffin  Procedure(s) Performed: Procedure(s): AXILLARY LYMPH NODE DISSECTION (Left) INSERTION PORT-A-CATH (Right)  Patient Location: PACU  Anesthesia Type:General  Level of Consciousness: awake, alert  and oriented  Airway & Oxygen Therapy: Patient Spontanous Breathing and Patient connected to face mask oxygen  Post-op Assessment: Report given to PACU RN and Post -op Vital signs reviewed and stable  Post vital signs: Reviewed and stable  Complications: No apparent anesthesia complications

## 2012-12-02 ENCOUNTER — Emergency Department (HOSPITAL_COMMUNITY): Payer: Medicare Other

## 2012-12-02 ENCOUNTER — Telehealth (INDEPENDENT_AMBULATORY_CARE_PROVIDER_SITE_OTHER): Payer: Self-pay | Admitting: *Deleted

## 2012-12-02 ENCOUNTER — Encounter (HOSPITAL_BASED_OUTPATIENT_CLINIC_OR_DEPARTMENT_OTHER): Payer: Self-pay | Admitting: General Surgery

## 2012-12-02 ENCOUNTER — Inpatient Hospital Stay (HOSPITAL_COMMUNITY)
Admission: EM | Admit: 2012-12-02 | Discharge: 2012-12-08 | DRG: 201 | Disposition: A | Discharge status: Home / Self Care | Payer: Medicare Other | Attending: General Surgery | Admitting: General Surgery

## 2012-12-02 DIAGNOSIS — K5900 Constipation, unspecified: Secondary | ICD-10-CM | POA: Diagnosis present

## 2012-12-02 DIAGNOSIS — F411 Generalized anxiety disorder: Secondary | ICD-10-CM | POA: Diagnosis present

## 2012-12-02 DIAGNOSIS — J9383 Other pneumothorax: Principal | ICD-10-CM | POA: Diagnosis present

## 2012-12-02 DIAGNOSIS — Z882 Allergy status to sulfonamides status: Secondary | ICD-10-CM

## 2012-12-02 DIAGNOSIS — Z9010 Acquired absence of unspecified breast and nipple: Secondary | ICD-10-CM

## 2012-12-02 DIAGNOSIS — J939 Pneumothorax, unspecified: Secondary | ICD-10-CM

## 2012-12-02 DIAGNOSIS — G5600 Carpal tunnel syndrome, unspecified upper limb: Secondary | ICD-10-CM | POA: Diagnosis present

## 2012-12-02 DIAGNOSIS — Z801 Family history of malignant neoplasm of trachea, bronchus and lung: Secondary | ICD-10-CM

## 2012-12-02 DIAGNOSIS — Z87891 Personal history of nicotine dependence: Secondary | ICD-10-CM

## 2012-12-02 DIAGNOSIS — Z79899 Other long term (current) drug therapy: Secondary | ICD-10-CM

## 2012-12-02 DIAGNOSIS — J9819 Other pulmonary collapse: Secondary | ICD-10-CM | POA: Diagnosis not present

## 2012-12-02 DIAGNOSIS — J95811 Postprocedural pneumothorax: Secondary | ICD-10-CM

## 2012-12-02 DIAGNOSIS — C50919 Malignant neoplasm of unspecified site of unspecified female breast: Secondary | ICD-10-CM | POA: Diagnosis present

## 2012-12-02 DIAGNOSIS — Z8052 Family history of malignant neoplasm of bladder: Secondary | ICD-10-CM

## 2012-12-02 LAB — CBC WITH DIFFERENTIAL/PLATELET
Basophils Absolute: 0 10*3/uL (ref 0.0–0.1)
Eosinophils Relative: 1 % (ref 0–5)
HCT: 34 % — ABNORMAL LOW (ref 36.0–46.0)
Hemoglobin: 11.4 g/dL — ABNORMAL LOW (ref 12.0–15.0)
Lymphocytes Relative: 21 % (ref 12–46)
MCHC: 33.5 g/dL (ref 30.0–36.0)
MCV: 87.2 fL (ref 78.0–100.0)
Monocytes Absolute: 1 10*3/uL (ref 0.1–1.0)
Monocytes Relative: 11 % (ref 3–12)
RDW: 13.9 % (ref 11.5–15.5)
WBC: 9.3 10*3/uL (ref 4.0–10.5)

## 2012-12-02 LAB — BASIC METABOLIC PANEL
BUN: 12 mg/dL (ref 6–23)
CO2: 31 mEq/L (ref 19–32)
Calcium: 8.7 mg/dL (ref 8.4–10.5)
Creatinine, Ser: 0.69 mg/dL (ref 0.50–1.10)

## 2012-12-02 MED ORDER — OXYCODONE-ACETAMINOPHEN 5-325 MG PO TABS
1.0000 | ORAL_TABLET | Freq: Once | ORAL | Status: AC
  Administered 2012-12-02: 1 via ORAL
  Filled 2012-12-02: qty 1

## 2012-12-02 MED ORDER — HYDROMORPHONE HCL PF 1 MG/ML IJ SOLN: 1.0000 mg | Freq: Once | INTRAMUSCULAR | Status: DC

## 2012-12-02 MED ORDER — ONDANSETRON HCL 4 MG/2ML IJ SOLN
4.0000 mg | Freq: Once | INTRAMUSCULAR | Status: AC
  Administered 2012-12-03: 4 mg via INTRAVENOUS
  Filled 2012-12-02: qty 2

## 2012-12-02 MED ORDER — MORPHINE SULFATE 2 MG/ML IJ SOLN
2.0000 mg | Freq: Once | INTRAMUSCULAR | Status: AC
  Administered 2012-12-02: 2 mg via INTRAVENOUS

## 2012-12-02 MED ORDER — MORPHINE SULFATE 2 MG/ML IJ SOLN
2.0000 mg | Freq: Once | INTRAMUSCULAR | Status: AC
  Administered 2012-12-03: 2 mg via INTRAVENOUS
  Filled 2012-12-02: qty 1

## 2012-12-02 MED ORDER — IOHEXOL 350 MG/ML SOLN
100.0000 mL | Freq: Once | INTRAVENOUS | Status: AC | PRN
  Administered 2012-12-02: 100 mL via INTRAVENOUS

## 2012-12-02 MED ORDER — MORPHINE SULFATE 2 MG/ML IJ SOLN
INTRAMUSCULAR | Status: AC
  Filled 2012-12-02: qty 1

## 2012-12-02 NOTE — ED Notes (Signed)
Dr Donell Beers performed surgery on pt yesterday to remove more lymph nodes from previous L mastectomy and to place R chest portacath.  Pt c/o swelling to neck (tissue) and what feels pain like an "air pocket" under her R ribs that increases with inspiration or when she coughs.  Last took pain meds at 1400.

## 2012-12-02 NOTE — ED Provider Notes (Signed)
History     CSN: [id number]  Arrival date & time 12/02/12  1716   First MD Initiated Contact with Patient 12/02/12 2012      Chief Complaint  Patient presents with  . Facial Swelling  . Chest Pain    (Consider location/radiation/quality/duration/timing/severity/associated sxs/prior treatment) Patient is a 67 y.o. female presenting with chest pain. The history is provided by the patient.  Chest Pain She had surgery yesterday consisting of left axillary node dissection and insertion of a Port-A-Cath on the right. At about 4 AM, she noted that she was having some swelling in her neck and face which is gotten progressively worse. There's also pain in that area which goes down into the right lower rib cage. Pain is worse with taking a breath or coughing. Percocet gave her some slight relief.  Past Medical History  Diagnosis Date  . Breast cancer   . Arthritis   . Anxiety   . Carpal tunnel syndrome of right wrist   . Family history of anesthesia complication     pneumothorax - post op 1980's    Past Surgical History  Procedure Laterality Date  . Abdominal hysterectomy    . Appendectomy    . Tonsillectomy    . Meniscus repair      Right Knee  . Mastectomy w/ sentinel node biopsy Left 11/14/2012    Procedure: MASTECTOMY WITH SENTINEL LYMPH NODE BIOPSY;  Surgeon: Almond Lint, MD;  Location: [facility];  Service: General;  Laterality: Left;  . Axillary lymph node dissection Left 12/01/2012    Procedure: AXILLARY LYMPH NODE DISSECTION;  Surgeon: Almond Lint, MD;  Location: [facility];  Service: General;  Laterality: Left;  . Portacath placement Right 12/01/2012    Procedure: INSERTION PORT-A-CATH;  Surgeon: Almond Lint, MD;  Location: [facility];  Service: General;  Laterality: Right;    Family History  Problem Relation Age of Onset  . Bladder Cancer Father   . Lung cancer Father     History  Substance Use Topics  . Smoking status: Former Smoker   Quit date: 09/07/1992  . Smokeless tobacco: Not on file  . Alcohol Use: 4.2 oz/week    7 Glasses of wine per week    OB History   Grav Para Term Preterm Abortions TAB SAB Ect Mult Living                  Review of Systems  Cardiovascular: Positive for chest pain.  All other systems reviewed and are negative.    Allergies  Sulfa antibiotics  Home Medications   Current Outpatient Rx  Name  Route  Sig  Dispense  Refill  . cyclobenzaprine (FLEXERIL) 5 MG tablet   Oral   Take 1 tablet (5 mg total) by mouth 3 (three) times daily as needed for muscle spasms.   30 tablet   0   . HYDROcodone-acetaminophen (NORCO) 5-325 MG per tablet   Oral   Take 1-2 tablets by mouth every 4 (four) hours as needed for pain.   30 tablet   1   . Biotin 5000 MCG CAPS   Oral   Take 5,000 mcg by mouth daily.         . calcium citrate-vitamin D (CITRACAL+D) 315-200 MG-UNIT per tablet   Oral   Take 1 tablet by mouth daily.         . citalopram (CELEXA) 40 MG tablet   Oral   Take 40 mg by mouth daily.         Marland Kitchen  Glucosamine-Chondroit-Vit C-Mn (GLUCOSAMINE CHONDR 1500 COMPLX PO)   Oral   Take 1 capsule by mouth daily.         . Multiple Vitamin (MULTIVITAMIN WITH MINERALS) TABS   Oral   Take 1 tablet by mouth daily.           BP 152/66  Pulse 100  Temp(Src) 99.8 F (37.7 C) (Oral)  Resp 22  Ht 5\' 5"  (1.651 m)  Wt 155 lb (70.308 kg)  BMI 25.79 kg/m2  SpO2 93%  Physical Exam  Nursing note and vitals reviewed.  67 year old female, resting comfortably and in no acute distress. Vital signs are significant for hypertension with blood pressure 152/66, and mild tachypnea with respiratory rate of 22. Oxygen saturation is 93%, which is normal. Head is normocephalic and atraumatic. PERRLA, EOMI. Oropharynx is clear. Neck is nontender and supple without adenopathy or JVD. There is mild soft tissue swelling bilaterally. Sub-cutaneous emphysema is present in the right side of the  neck. Back is nontender and there is no CVA tenderness. Lungs are clear without rales, wheezes, or rhonchi. Chest: Mediport is present right anterior chest with scar healing appropriately. No subcutaneous emphysema is felt in the chest. Heart has regular rate and rhythm without murmur. Abdomen is soft, flat, nontender without masses or hepatosplenomegaly and peristalsis is normoactive. Extremities have no cyanosis or edema, full range of motion is present. Skin is warm and dry without rash. Neurologic: Mental status is normal, cranial nerves are intact, there are no motor or sensory deficits.  ED Course  Procedures (including critical care time)  Results for orders placed during the hospital encounter of 12/02/12  CBC WITH DIFFERENTIAL      Result Value Range   WBC 9.3  4.0 - 10.5 K/uL   RBC 3.90  3.87 - 5.11 MIL/uL   Hemoglobin 11.4 (*) 12.0 - 15.0 g/dL   HCT 16.1 (*) 09.6 - 04.5 %   MCV 87.2  78.0 - 100.0 fL   MCH 29.2  26.0 - 34.0 pg   MCHC 33.5  30.0 - 36.0 g/dL   RDW 40.9  81.1 - 91.4 %   Platelets 250  150 - 400 K/uL   Neutrophils Relative 67  43 - 77 %   Neutro Abs 6.3  1.7 - 7.7 K/uL   Lymphocytes Relative 21  12 - 46 %   Lymphs Abs 2.0  0.7 - 4.0 K/uL   Monocytes Relative 11  3 - 12 %   Monocytes Absolute 1.0  0.1 - 1.0 K/uL   Eosinophils Relative 1  0 - 5 %   Eosinophils Absolute 0.1  0.0 - 0.7 K/uL   Basophils Relative 0  0 - 1 %   Basophils Absolute 0.0  0.0 - 0.1 K/uL  BASIC METABOLIC PANEL      Result Value Range   Sodium 140  135 - 145 mEq/L   Potassium 3.9  3.5 - 5.1 mEq/L   Chloride 103  96 - 112 mEq/L   CO2 31  19 - 32 mEq/L   Glucose, Bld 116 (*) 70 - 99 mg/dL   BUN 12  6 - 23 mg/dL   Creatinine, Ser 7.82  0.50 - 1.10 mg/dL   Calcium 8.7  8.4 - 95.6 mg/dL   GFR calc non Af Amer 89 (*) >90 mL/min   GFR calc Af Amer >90  >90 mL/min   Ct Angio Chest W/cm &/or Wo Cm  12/02/2012  *RADIOLOGY REPORT*  Clinical  Data: Facial swelling, chest pain, breast  cancer post surgery/lymph node resection  CT ANGIOGRAPHY CHEST  Technique:  Multidetector CT imaging of the chest using the standard protocol during bolus administration of intravenous contrast. Multiplanar reconstructed images including MIPs were obtained and reviewed to evaluate the vascular anatomy.  Contrast: OMNIPAQUE IOHEXOL 350 MG/ML SOLN  Comparison: None.  Findings: Large right pneumothorax with sub total collapse of right lung. Mediastinal shift right to left consistent with tension pneumothorax. Dr. Preston Fleeting immediately notified of this finding.  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 Minimal atelectasis in lingula. Right jugular Port-A-Cath with tip in SVC. Aorta normal caliber without aneurysm or dissection. Pulmonary arteries patent. No evidence of pulmonary embolism. Remaining left lung clear. Surgical drain left axilla. Sclerosis identified anterior aspect of T10 vertebral body inferiorly may represent discogenic sclerosis though difficult to completely exclude sclerotic metastasis. Bulky endplate spurs at the T7-T8 significantly narrow the spinal canal.  IMPRESSION: Right tension pneumothorax with mediastinal shift right to left; Dr. Preston Fleeting immediately notified of this finding at 2312 hours on 12/02/2012. Sub total atelectasis right lung. 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 Sclerosis inferior T10 suspect discogenic though difficult to completely exclude sclerotic metastasis.   Original Report Authenticated By: Ulyses Southward, M.D.     Dg Chest Port 1 View  12/01/2012  *RADIOLOGY REPORT*  Clinical Data: 67 year old female status post Port-A-Cath placement.  PORTABLE CHEST - 1 VIEW  Comparison: None.  Findings: Semi upright AP portable view at 1050 hours.  Right chest subclavian approach Port-A-Cath placed.  Catheter tip not clearly delineated, but probably terminates in  the cavoatrial junction region.  No pneumothorax on the right.  There is patchy increased right basilar opacity.  Postoperative changes to the left axilla with mild subcutaneous chest wall gas.  Tubing projecting over the left chest may be a superficial surgical drain.  Visualized tracheal air column is within normal limits.  No pulmonary edema or pleural effusion identified.  Increased retrocardiac opacity also at the left base, but this might be related to the chest wall artifact, uncertain.  IMPRESSION: 1.  Right chest Port-A-Cath placed with no adverse features. 2.  Postoperative changes to the left chest wall and axilla. 3.  Patchy right greater than left lung base opacity.  Perhaps this is atelectasis but aspiration is difficult to exclude.  Follow-up films recommended if there are pulmonary symptoms.   Original Report Authenticated By: Erskine Speed, M.D.    Images viewed by me, discussed with radiologist.        1. Pneumothorax, right       MDM  Swelling of the neck post insertion of Port-A-Cath. Am concerned about possible venous occlusion. I discussed case with Dr. Carolynne Edouard and decision was made to get a CT angiogram which would evaluate for possible pneumothorax as well as possible clot in the venous system.  CT shows pneumothorax under some tension. Dr. Carolynne Edouard is in to see the patient to insert chest tube.        Dione Booze, MD 12/02/12 [phone]

## 2012-12-02 NOTE — Telephone Encounter (Signed)
Husband called to report that patient is having bilateral neck swelling and a fever of about 100F oral.  Patient has been taking Vicodin for the pain which also has Tylenol and patient continues to have documented fever.  Patient encouraged to go to the ED at this time due to the neck swelling and fever.  Unknown at this time which hospital ([facility] or [facility]) patient will go too but encouraged them to go to one or the other of these two since we have physician's available if needed.

## 2012-12-02 NOTE — ED Notes (Signed)
Pt had her left breast removed November 14, 2012

## 2012-12-02 NOTE — ED Notes (Signed)
Reconnected diagnostic equipment. Patient requests pain medication.

## 2012-12-03 ENCOUNTER — Inpatient Hospital Stay (HOSPITAL_COMMUNITY): Payer: Medicare Other

## 2012-12-03 DIAGNOSIS — J9383 Other pneumothorax: Secondary | ICD-10-CM | POA: Diagnosis not present

## 2012-12-03 DIAGNOSIS — J9819 Other pulmonary collapse: Secondary | ICD-10-CM | POA: Diagnosis not present

## 2012-12-03 LAB — BASIC METABOLIC PANEL
BUN: 10 mg/dL (ref 6–23)
CO2: 28 mEq/L (ref 19–32)
Calcium: 8.3 mg/dL — ABNORMAL LOW (ref 8.4–10.5)
Chloride: 104 mEq/L (ref 96–112)
Creatinine, Ser: 0.68 mg/dL (ref 0.50–1.10)

## 2012-12-03 LAB — CBC
HCT: 33.6 % — ABNORMAL LOW (ref 36.0–46.0)
MCHC: 33 g/dL (ref 30.0–36.0)
MCV: 88 fL (ref 78.0–100.0)
Platelets: 220 10*3/uL (ref 150–400)
RDW: 14 % (ref 11.5–15.5)

## 2012-12-03 LAB — CREATININE, SERUM: GFR calc non Af Amer: 89 mL/min — ABNORMAL LOW (ref >90)

## 2012-12-03 MED ORDER — ENOXAPARIN SODIUM 40 MG/0.4ML ~~LOC~~ SOLN
40.0000 mg | SUBCUTANEOUS | Status: DC
  Administered 2012-12-04 – 2012-12-07 (×4): 40 mg via SUBCUTANEOUS
  Filled 2012-12-03 (×5): qty 0.4

## 2012-12-03 MED ORDER — HYDROCODONE-ACETAMINOPHEN 5-325 MG PO TABS
1.0000 | ORAL_TABLET | ORAL | Status: DC | PRN
  Administered 2012-12-03 (×3): 1 via ORAL
  Administered 2012-12-03: 2 via ORAL
  Administered 2012-12-03: 1 via ORAL
  Administered 2012-12-04 (×2): 2 via ORAL
  Filled 2012-12-03 (×2): qty 2

## 2012-12-03 MED ORDER — HYDROCODONE-ACETAMINOPHEN 5-325 MG PO TABS
1.0000 | ORAL_TABLET | ORAL | Status: DC | PRN
  Administered 2012-12-04: 2 via ORAL
  Administered 2012-12-04: 1 via ORAL
  Administered 2012-12-04 – 2012-12-06 (×7): 2 via ORAL
  Administered 2012-12-06: 1 via ORAL
  Administered 2012-12-07: 2 via ORAL
  Administered 2012-12-07: 1 via ORAL
  Administered 2012-12-07 – 2012-12-08 (×4): 2 via ORAL
  Filled 2012-12-03 (×3): qty 2
  Filled 2012-12-03: qty 1
  Filled 2012-12-03 (×7): qty 2
  Filled 2012-12-03: qty 1
  Filled 2012-12-03 (×2): qty 2
  Filled 2012-12-03: qty 1
  Filled 2012-12-03 (×5): qty 2

## 2012-12-03 MED ORDER — CYCLOBENZAPRINE HCL 10 MG PO TABS
5.0000 mg | ORAL_TABLET | Freq: Three times a day (TID) | ORAL | Status: DC | PRN
  Administered 2012-12-03 – 2012-12-05 (×5): 5 mg via ORAL
  Filled 2012-12-03 (×3): qty 1
  Filled 2012-12-03: qty 2
  Filled 2012-12-03: qty 1

## 2012-12-03 MED ORDER — CITALOPRAM HYDROBROMIDE 40 MG PO TABS
40.0000 mg | ORAL_TABLET | Freq: Every day | ORAL | Status: DC
  Administered 2012-12-03 – 2012-12-08 (×6): 40 mg via ORAL
  Filled 2012-12-03 (×6): qty 1

## 2012-12-03 MED ORDER — MORPHINE SULFATE 4 MG/ML IJ SOLN
4.0000 mg | INTRAMUSCULAR | Status: DC | PRN
  Administered 2012-12-03 – 2012-12-07 (×5): 4 mg via INTRAVENOUS
  Filled 2012-12-03 (×5): qty 1

## 2012-12-03 MED ORDER — PANTOPRAZOLE SODIUM 40 MG IV SOLR
40.0000 mg | Freq: Every day | INTRAVENOUS | Status: DC
  Administered 2012-12-03 (×2): 40 mg via INTRAVENOUS
  Filled 2012-12-03 (×4): qty 40

## 2012-12-03 MED ORDER — ONDANSETRON HCL 4 MG/2ML IJ SOLN: 4.0000 mg | Freq: Four times a day (QID) | INTRAMUSCULAR | Status: DC | PRN

## 2012-12-03 MED ORDER — KCL IN DEXTROSE-NACL 20-5-0.9 MEQ/L-%-% IV SOLN
INTRAVENOUS | Status: DC
  Administered 2012-12-03 – 2012-12-05 (×5): via INTRAVENOUS
  Filled 2012-12-03 (×8): qty 1000

## 2012-12-03 NOTE — H&P (Signed)
Cynthia Griffin is an 67 y.o. female.   Chief Complaint: chest and neck swelling HPI: Cynthia Griffin who underwent left axillary lymph node dissection and placement of right subclavian vein port yesterday. She went home and coughed last night and developed swelling of her right neck and chest wall. She has had some shortness of breath. She came to ER and CT chest showed large right pneumothorax.  Past Medical History  Diagnosis Date  . Breast cancer   . Arthritis   . Anxiety   . Carpal tunnel syndrome of right wrist   . Family history of anesthesia complication     pneumothorax - post op 1980's    Past Surgical History  Procedure Laterality Date  . Abdominal hysterectomy    . Appendectomy    . Tonsillectomy    . Meniscus repair      Right Knee  . Mastectomy w/ sentinel node biopsy Left 11/14/2012    Procedure: MASTECTOMY WITH SENTINEL LYMPH NODE BIOPSY;  Surgeon: Almond Lint, MD;  Location: [facility];  Service: General;  Laterality: Left;  . Axillary lymph node dissection Left 12/01/2012    Procedure: AXILLARY LYMPH NODE DISSECTION;  Surgeon: Almond Lint, MD;  Location: [facility];  Service: General;  Laterality: Left;  . Portacath placement Right 12/01/2012    Procedure: INSERTION PORT-A-CATH;  Surgeon: Almond Lint, MD;  Location: [facility];  Service: General;  Laterality: Right;    Family History  Problem Relation Age of Onset  . Bladder Cancer Father   . Lung cancer Father    Social History:  reports that she quit smoking about 20 years ago. She does not have any smokeless tobacco history on file. She reports that she drinks about 4.2 ounces of alcohol per week. She reports that she does not use illicit drugs.  Allergies:  Allergies  Allergen Reactions  . Sulfa Antibiotics Nausea Only and Other (See Comments)    Stomach cramps     (Not in a hospital admission)  Results for orders placed during the hospital encounter of 12/02/12 (from the past  48 hour(s))  CBC WITH DIFFERENTIAL     Status: Abnormal   Collection Time    12/02/12  9:00 PM      Result Value Range   WBC 9.3  4.0 - 10.5 K/uL   RBC 3.90  3.87 - 5.11 MIL/uL   Hemoglobin 11.4 (*) 12.0 - 15.0 g/dL   HCT 40.9 (*) 81.1 - 91.4 %   MCV 87.2  78.0 - 100.0 fL   MCH 29.2  26.0 - 34.0 pg   MCHC 33.5  30.0 - 36.0 g/dL   RDW 78.2  95.6 - 21.3 %   Platelets 250  150 - 400 K/uL   Neutrophils Relative 67  43 - 77 %   Neutro Abs 6.3  1.7 - 7.7 K/uL   Lymphocytes Relative 21  12 - 46 %   Lymphs Abs 2.0  0.7 - 4.0 K/uL   Monocytes Relative 11  3 - 12 %   Monocytes Absolute 1.0  0.1 - 1.0 K/uL   Eosinophils Relative 1  0 - 5 %   Eosinophils Absolute 0.1  0.0 - 0.7 K/uL   Basophils Relative 0  0 - 1 %   Basophils Absolute 0.0  0.0 - 0.1 K/uL  BASIC METABOLIC PANEL     Status: Abnormal   Collection Time    12/02/12  9:00 PM  Result Value Range   Sodium 140  135 - 145 mEq/L   Potassium 3.9  3.5 - 5.1 mEq/L   Chloride 103  96 - 112 mEq/L   CO2 31  19 - 32 mEq/L   Glucose, Bld 116 (*) 70 - 99 mg/dL   BUN 12  6 - 23 mg/dL   Creatinine, Ser 1.61  0.50 - 1.10 mg/dL   Calcium 8.7  8.4 - 09.6 mg/dL   GFR calc non Af Amer 89 (*) >90 mL/min   GFR calc Af Amer >90  >90 mL/min   Comment:            The eGFR has been calculated     using the CKD EPI equation.     This calculation has not been     validated in all clinical     situations.     eGFR's persistently     <90 mL/min signify     possible Chronic Kidney Disease.   Ct Angio Chest W/cm &/or Wo Cm  12/02/2012  *RADIOLOGY REPORT*  Clinical Data: Facial swelling, chest pain, breast cancer post surgery/lymph node resection  CT ANGIOGRAPHY CHEST  Technique:  Multidetector CT imaging of the chest using the standard protocol during bolus administration of intravenous contrast. Multiplanar reconstructed images including MIPs were obtained and reviewed to evaluate the vascular anatomy.  Contrast: OMNIPAQUE IOHEXOL 350 MG/ML  SOLN  Comparison: None.  Findings: Large right pneumothorax with sub total collapse of right lung. Mediastinal shift right to left consistent with tension pneumothorax. Dr. Preston Fleeting immediately notified of this finding.  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 Minimal atelectasis in lingula. Right jugular Port-A-Cath with tip in SVC. Aorta normal caliber without aneurysm or dissection. Pulmonary arteries patent. No evidence of pulmonary embolism. Remaining left lung clear. Surgical drain left axilla. Sclerosis identified anterior aspect of T10 vertebral body inferiorly may represent discogenic sclerosis though difficult to completely exclude sclerotic metastasis. Bulky endplate spurs at the T7-T8 significantly narrow the spinal canal.  IMPRESSION: Right tension pneumothorax with mediastinal shift right to left; Dr. Preston Fleeting immediately notified of this finding at 2312 hours on 12/02/2012. Sub total atelectasis right lung. 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 Sclerosis inferior T10 suspect discogenic though difficult to completely exclude sclerotic metastasis.   Original Report Authenticated By: Ulyses Southward, M.D.    Dg Chest Port 1 View  12/01/2012  *RADIOLOGY REPORT*  Clinical Data: 67 year old female status post Port-A-Cath placement.  PORTABLE CHEST - 1 VIEW  Comparison: None.  Findings: Semi upright AP portable view at 1050 hours.  Right chest subclavian approach Port-A-Cath placed.  Catheter tip not clearly delineated, but probably terminates in the cavoatrial junction region.  No pneumothorax on the right.  There is patchy increased right basilar opacity.  Postoperative changes to the left axilla with mild subcutaneous chest wall gas.  Tubing projecting over the left chest may be a superficial surgical drain.  Visualized tracheal air column is within normal limits.  No pulmonary edema or  pleural effusion identified.  Increased retrocardiac opacity also at the left base, but this might be related to the chest wall artifact, uncertain.  IMPRESSION: 1.  Right chest Port-A-Cath placed with no adverse features. 2.  Postoperative changes to the left chest wall and axilla. 3.  Patchy right greater than left lung base opacity.  Perhaps this is atelectasis but aspiration is difficult to exclude.  Follow-up  films recommended if there are pulmonary symptoms.   Original Report Authenticated By: Erskine Speed, M.D.    Dg Fluoro Guide Cv Line-no Report  12/01/2012  CLINICAL DATA: port placement   FLOURO GUIDE CV LINE  Fluoroscopy was utilized by the requesting physician.  No radiographic  interpretation.      Review of Systems  Constitutional: Negative.   Eyes: Negative.   Respiratory: Positive for shortness of breath.   Cardiovascular: Positive for chest pain.  Gastrointestinal: Negative.   Genitourinary: Negative.   Musculoskeletal: Negative.   Skin: Negative.   Endo/Heme/Allergies: Negative.   Psychiatric/Behavioral: Negative.     Blood pressure 160/75, pulse 96, temperature 99.8 F (37.7 C), temperature source Oral, resp. rate 19, height 5\' 5"  (1.651 m), weight 155 lb (70.308 kg), SpO2 94.00%. Physical Exam  Constitutional: She is oriented to person, place, and time. She appears well-developed and well-nourished.  HENT:  Head: Normocephalic and atraumatic.  Eyes: Conjunctivae and EOM are normal. Pupils are equal, round, and reactive to light.  Neck: Normal range of motion. Neck supple.  There is subq emphysema  Cardiovascular: Normal rate, regular rhythm and normal heart sounds.   Respiratory: Effort normal.  Decreased breath sounds on right. There is subq emphysema  GI: Soft. Bowel sounds are normal.  Musculoskeletal: Normal range of motion.  Neurological: She is alert and oriented to person, place, and time.  Skin: Skin is warm and dry.  Psychiatric: She has a normal mood  and affect. Her behavior is normal.     Assessment/Plan Right pneumothorax treated with chest tube. Will admit to stepdown for monitoring.  TOTH III,PAUL S 12/03/2012, 12:00 AM

## 2012-12-03 NOTE — Progress Notes (Signed)
Subjective: Comfortable other than mild pain at chest tube site No SOB  Objective: Vital signs in last 24 hours: Temp:  [98.3 F (36.8 C)-99.8 F (37.7 C)] 98.7 F (37.1 C) (03/29 0800) Pulse Rate:  [87-105] 91 (03/29 0425) Resp:  [17-30] 28 (03/29 0425) BP: (152-195)/(66-82) 162/82 mmHg (03/29 0425) SpO2:  [90 %-96 %] 96 % (03/29 0425) Weight:  [155 lb (70.308 kg)] 155 lb (70.308 kg) (03/28 1747) Last BM Date:  (PTA)  Intake/Output from previous day: 03/28 0701 - 03/29 0700 In: 340 [P.O.:240; I.V.:100] Out: 175 [Drains:40; Chest Tube:75] Intake/Output this shift:    Lungs clear bilat No air leak  Lab Results:   Recent Labs  12/02/12 2100 12/03/12 0155  WBC 9.3 7.6  HGB 11.4* 11.1*  HCT 34.0* 33.6*  PLT 250 220   BMET  Recent Labs  12/02/12 2100 12/03/12 0155  NA 140 139  K 3.9 3.8  CL 103 104  CO2 31 28  GLUCOSE 116* 114*  BUN 12 10  CREATININE 0.69 0.69  0.68  CALCIUM 8.7 8.3*   PT/INR No results found for this basename: LABPROT, INR,  in the last 72 hours ABG No results found for this basename: PHART, PCO2, PO2, HCO3,  in the last 72 hours  Studies/Results: Ct Angio Chest W/cm &/or Wo Cm  12/02/2012  *RADIOLOGY REPORT*  Clinical Data: Facial swelling, chest pain, breast cancer post surgery/lymph node resection  CT ANGIOGRAPHY CHEST  Technique:  Multidetector CT imaging of the chest using the standard protocol during bolus administration of intravenous contrast. Multiplanar reconstructed images including MIPs were obtained and reviewed to evaluate the vascular anatomy.  Contrast: OMNIPAQUE IOHEXOL 350 MG/ML SOLN  Comparison: None.  Findings: Large right pneumothorax with sub total collapse of right lung. Mediastinal shift right to left consistent with tension pneumothorax. Dr. Preston Fleeting immediately notified of this finding.  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 Minimal atelectasis in lingula. Right  jugular Port-A-Cath with tip in SVC. Aorta normal caliber without aneurysm or dissection. Pulmonary arteries patent. No evidence of pulmonary embolism. Remaining left lung clear. Surgical drain left axilla. Sclerosis identified anterior aspect of T10 vertebral body inferiorly may represent discogenic sclerosis though difficult to completely exclude sclerotic metastasis. Bulky endplate spurs at the T7-T8 significantly narrow the spinal canal.  IMPRESSION: Right tension pneumothorax with mediastinal shift right to left; Dr. Preston Fleeting immediately notified of this finding at 2312 hours on 12/02/2012. Sub total atelectasis right lung. 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 Sclerosis inferior T10 suspect discogenic though difficult to completely exclude sclerotic metastasis.   Original Report Authenticated By: Ulyses Southward, M.D.    Dg Chest Port 1 View  12/03/2012  *RADIOLOGY REPORT*  Clinical Data: Tension pneumothorax and pneumomediastinum, treated with right-sided chest tube on 12/02/2012.  PORTABLE CHEST - 1 VIEW  Comparison: 12/02/2012  Findings: Right-sided chest tube is in place.  There is a 5% right pneumothorax.  Subcutaneous emphysema is again noted along the neck, and linear lucencies along the mediastinum indicate some residual pneumomediastinum.  A band of atelectasis persists at the right lung base.  Right Port- A-Cath tip:  lower SVC.  Cardiothoracic index 59% on AP projection, suggesting mild cardiomegaly.  IMPRESSION:  1.  Right chest tube in place.  5% right-sided pneumothorax.  Small amount of residual pneumomediastinum.  No tension component of the pneumothorax. 2.  Stable band of atelectasis at the right lung base.  3.  Mild cardiomegaly.   Original Report Authenticated By: Gaylyn Rong, M.D.    Dg Chest Portable 1 View  12/03/2012  *RADIOLOGY REPORT*  Clinical Data: Pneumothorax. Post chest tube insertion.  PORTABLE  CHEST - 1 VIEW  Comparison: Chest CT 12/02/2012.  Findings: There has been interval placement of a right-sided chest tube with significant decrease in size of the right-sided pneumothorax which now appears to be a small (likely partially loculated at the right base).  There is no definite right to left midline shift at this time suggesting resolution of prior tension. Linear opacity in the right mid and lower lung as compatible with some subsegmental atelectasis.  Left lung is clear.  No pleural effusions.  Pulmonary vasculature is within normal limits.  Heart size is normal.  Mediastinal contours are slightly distorted by patient positioning.  Right-sided single-lumen Port-A-Cath with tip terminating in the distal superior vena cava.  Extensive subcutaneous emphysema in the right chest wall, and extensive subcutaneous emphysema throughout the cervical regions bilaterally. A surgical drain is noted in the soft tissues of the left chest wall.  IMPRESSION: 1.  Interval placement of right-sided chest tube which appears to be properly located with near complete resolution of the previously noted right-sided tension pneumothorax. 2.  Additional incidental findings, as above.   Original Report Authenticated By: Trudie Reed, M.D.    Dg Chest Port 1 View  12/01/2012  *RADIOLOGY REPORT*  Clinical Data: 67 year old female status post Port-A-Cath placement.  PORTABLE CHEST - 1 VIEW  Comparison: None.  Findings: Semi upright AP portable view at 1050 hours.  Right chest subclavian approach Port-A-Cath placed.  Catheter tip not clearly delineated, but probably terminates in the cavoatrial junction region.  No pneumothorax on the right.  There is patchy increased right basilar opacity.  Postoperative changes to the left axilla with mild subcutaneous chest wall gas.  Tubing projecting over the left chest may be a superficial surgical drain.  Visualized tracheal air column is within normal limits.  No pulmonary edema or  pleural effusion identified.  Increased retrocardiac opacity also at the left base, but this might be related to the chest wall artifact, uncertain.  IMPRESSION: 1.  Right chest Port-A-Cath placed with no adverse features. 2.  Postoperative changes to the left chest wall and axilla. 3.  Patchy right greater than left lung base opacity.  Perhaps this is atelectasis but aspiration is difficult to exclude.  Follow-up films recommended if there are pulmonary symptoms.   Original Report Authenticated By: Erskine Speed, M.D.    Dg Fluoro Guide Cv Line-no Report  12/01/2012  CLINICAL DATA: port placement   FLOURO GUIDE CV LINE  Fluoroscopy was utilized by the requesting physician.  No radiographic  interpretation.      Anti-infectives: Anti-infectives   None      Assessment/Plan: s/p * No surgery found *  cxr still with 5% residual PTX. Will leave on suction Transfer to 6N  LOS: 1 day    Kouper Spinella A 12/03/2012

## 2012-12-03 NOTE — Brief Op Note (Signed)
*   No surgery found *  12:10 AM  PATIENT:  Cynthia Griffin  67 y.o. female  PRE-OPERATIVE DIAGNOSIS:  Right pneumothorax  POST-OPERATIVE DIAGNOSIS:  Right pneumothorax  PROCEDURE:  * No surgery found *Placement right chest tube 28 Fr  SURGEON:  * Surgery not found *Toth  PHYSICIAN ASSISTANT:   ASSISTANTS: none   ANESTHESIA:   local  EBL:     BLOOD ADMINISTERED:none  DRAINS: 1 Chest Tube(s) in the right pleural space   LOCAL MEDICATIONS USED:  LIDOCAINE   SPECIMEN:  No Specimen  DISPOSITION OF SPECIMEN:  N/A  COUNTS:  YES  TOURNIQUET:  * No surgery found *  DICTATION: .Dragon Dictation Chest Tube Insertion Procedure Note  Indications:  Clinically significant Pneumothorax  Pre-operative Diagnosis: Pneumothorax  Post-operative Diagnosis: Pneumothorax  Procedure Details  Informed consent was obtained for the procedure, including sedation.  Risks of lung perforation, hemorrhage, arrhythmia, and adverse drug reaction were discussed.   After sterile skin prep, using standard technique, a 28 French tube was placed in the right lateral 4th rib space.  Findings: air  Estimated Blood Loss:  Minimal         Specimens:  None              Complications:  None; patient tolerated the procedure well.         Disposition: ICU - extubated and stable.         Condition: stable  Attending Attestation: I was present and scrubbed for the entire procedure.  PLAN OF CARE: Admit to inpatient   PATIENT DISPOSITION:  ICU - extubated and stable.   Delay start of Pharmacological VTE agent (>24hrs) due to surgical blood loss or risk of bleeding: yes

## 2012-12-04 ENCOUNTER — Inpatient Hospital Stay (HOSPITAL_COMMUNITY): Payer: Medicare Other

## 2012-12-04 DIAGNOSIS — J9383 Other pneumothorax: Secondary | ICD-10-CM | POA: Diagnosis not present

## 2012-12-04 MED ORDER — PANTOPRAZOLE SODIUM 40 MG PO TBEC
40.0000 mg | DELAYED_RELEASE_TABLET | Freq: Every day | ORAL | Status: DC
  Administered 2012-12-04 – 2012-12-07 (×4): 40 mg via ORAL
  Filled 2012-12-04 (×4): qty 1

## 2012-12-04 MED ORDER — WHITE PETROLATUM GEL
Status: AC
  Administered 2012-12-04: 0.2
  Filled 2012-12-04: qty 5

## 2012-12-04 NOTE — Progress Notes (Signed)
Patient ID: Cynthia Griffin, female   DOB: [birth year], 67 y.o.   MRN: [id number]    Subjective: Comfortable other than mild pain at chest tube site No SOB  Objective: Vital signs in last 24 hours: Temp:  [97.5 F (36.4 C)-98.9 F (37.2 C)] 97.5 F (36.4 C) (03/30 1011) Pulse Rate:  [71-81] 73 (03/30 1011) Resp:  [18-22] 19 (03/30 1011) BP: (123-152)/(61-70) 143/61 mmHg (03/30 1011) SpO2:  [95 %-98 %] 96 % (03/30 1011) Last BM Date: 12/01/12  Intake/Output from previous day: 03/29 0701 - 03/30 0700 In: 1005 [P.O.:480; I.V.:525] Out: 1678 [Urine:1500; Drains:78; Chest Tube:100] Intake/Output this shift:    Lungs clear bilat No air leak  Lab Results:   Recent Labs  12/02/12 2100 12/03/12 0155  WBC 9.3 7.6  HGB 11.4* 11.1*  HCT 34.0* 33.6*  PLT 250 220   BMET  Recent Labs  12/02/12 2100 12/03/12 0155  NA 140 139  K 3.9 3.8  CL 103 104  CO2 31 28  GLUCOSE 116* 114*  BUN 12 10  CREATININE 0.69 0.69  0.68  CALCIUM 8.7 8.3*   PT/INR No results found for this basename: LABPROT, INR,  in the last 72 hours ABG No results found for this basename: PHART, PCO2, PO2, HCO3,  in the last 72 hours  Studies/Results: Ct Angio Chest W/cm &/or Wo Cm  12/02/2012  *RADIOLOGY REPORT*  Clinical Data: Facial swelling, chest pain, breast cancer post surgery/lymph node resection  CT ANGIOGRAPHY CHEST  Technique:  Multidetector CT imaging of the chest using the standard protocol during bolus administration of intravenous contrast. Multiplanar reconstructed images including MIPs were obtained and reviewed to evaluate the vascular anatomy.  Contrast: OMNIPAQUE IOHEXOL 350 MG/ML SOLN  Comparison: None.  Findings: Large right pneumothorax with sub total collapse of right lung. Mediastinal shift right to left consistent with tension pneumothorax. Dr. Preston Fleeting immediately notified of this finding.  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  Minimal atelectasis in lingula. Right jugular Port-A-Cath with tip in SVC. Aorta normal caliber without aneurysm or dissection. Pulmonary arteries patent. No evidence of pulmonary embolism. Remaining left lung clear. Surgical drain left axilla. Sclerosis identified anterior aspect of T10 vertebral body inferiorly may represent discogenic sclerosis though difficult to completely exclude sclerotic metastasis. Bulky endplate spurs at the T7-T8 significantly narrow the spinal canal.  IMPRESSION: Right tension pneumothorax with mediastinal shift right to left; Dr. Preston Fleeting immediately notified of this finding at 2312 hours on 12/02/2012. Sub total atelectasis right lung. 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 Sclerosis inferior T10 suspect discogenic though difficult to completely exclude sclerotic metastasis.   Original Report Authenticated By: Ulyses Southward, M.D.    Dg Chest Port 1 View  12/04/2012  *RADIOLOGY REPORT*  Clinical Data: Evaluate right pneumothorax.  PORTABLE CHEST - 1 VIEW  Comparison: 12/03/2012  Findings: Stable position of the right chest tube. Again noted is pleural air along the right lung apex.  There may also be a medial basilar component to the pneumothorax.  Right basilar densities are suggestive for atelectasis.  The Port-A-Cath tip is near the cavoatrial junction.  Few densities at the left lung base are suggestive for atelectasis.  Again noted is bilateral subcutaneous gas.  IMPRESSION: The right pneumothorax appears to be similar in configuration and size.  There may be a basilar component and a right apical component.   Original Report Authenticated By: Richarda Overlie, M.D.  Dg Chest Port 1 View  12/03/2012  *RADIOLOGY REPORT*  Clinical Data: Tension pneumothorax and pneumomediastinum, treated with right-sided chest tube on 12/02/2012.  PORTABLE CHEST - 1 VIEW  Comparison: 12/02/2012  Findings: Right-sided chest  tube is in place.  There is a 5% right pneumothorax.  Subcutaneous emphysema is again noted along the neck, and linear lucencies along the mediastinum indicate some residual pneumomediastinum.  A band of atelectasis persists at the right lung base.  Right Port- A-Cath tip:  lower SVC.  Cardiothoracic index 59% on AP projection, suggesting mild cardiomegaly.  IMPRESSION:  1.  Right chest tube in place.  5% right-sided pneumothorax.  Small amount of residual pneumomediastinum.  No tension component of the pneumothorax. 2.  Stable band of atelectasis at the right lung base. 3.  Mild cardiomegaly.   Original Report Authenticated By: Gaylyn Rong, M.D.    Dg Chest Portable 1 View  12/03/2012  *RADIOLOGY REPORT*  Clinical Data: Pneumothorax. Post chest tube insertion.  PORTABLE CHEST - 1 VIEW  Comparison: Chest CT 12/02/2012.  Findings: There has been interval placement of a right-sided chest tube with significant decrease in size of the right-sided pneumothorax which now appears to be a small (likely partially loculated at the right base).  There is no definite right to left midline shift at this time suggesting resolution of prior tension. Linear opacity in the right mid and lower lung as compatible with some subsegmental atelectasis.  Left lung is clear.  No pleural effusions.  Pulmonary vasculature is within normal limits.  Heart size is normal.  Mediastinal contours are slightly distorted by patient positioning.  Right-sided single-lumen Port-A-Cath with tip terminating in the distal superior vena cava.  Extensive subcutaneous emphysema in the right chest wall, and extensive subcutaneous emphysema throughout the cervical regions bilaterally. A surgical drain is noted in the soft tissues of the left chest wall.  IMPRESSION: 1.  Interval placement of right-sided chest tube which appears to be properly located with near complete resolution of the previously noted right-sided tension pneumothorax. 2.  Additional  incidental findings, as above.   Original Report Authenticated By: Trudie Reed, M.D.     Anti-infectives: Anti-infectives   None      Assessment/Plan: 1. Right PTX: CXR unchanged today, will leave on suction today, ok to have regular diet, ok to walk in room on suction, repeat CXR in am, pending that poss to waterseal tomorrow.     LOS: 2 days    Toris Laverdiere 12/04/2012

## 2012-12-04 NOTE — Progress Notes (Signed)
Patient seen and examined.  Will increase suction.

## 2012-12-05 ENCOUNTER — Inpatient Hospital Stay (HOSPITAL_COMMUNITY): Payer: Medicare Other

## 2012-12-05 ENCOUNTER — Encounter (HOSPITAL_COMMUNITY): Payer: Self-pay | Admitting: General Practice

## 2012-12-05 ENCOUNTER — Other Ambulatory Visit: Payer: Self-pay | Admitting: Internal Medicine

## 2012-12-05 DIAGNOSIS — J982 Interstitial emphysema: Secondary | ICD-10-CM | POA: Diagnosis not present

## 2012-12-05 DIAGNOSIS — J9383 Other pneumothorax: Secondary | ICD-10-CM | POA: Diagnosis not present

## 2012-12-05 MED ORDER — BISACODYL 5 MG PO TBEC
10.0000 mg | DELAYED_RELEASE_TABLET | Freq: Every day | ORAL | Status: DC
  Administered 2012-12-05 – 2012-12-07 (×2): 10 mg via ORAL
  Filled 2012-12-05 (×4): qty 2

## 2012-12-05 MED ORDER — SENNOSIDES-DOCUSATE SODIUM 8.6-50 MG PO TABS
1.0000 | ORAL_TABLET | Freq: Two times a day (BID) | ORAL | Status: DC
  Administered 2012-12-05 – 2012-12-07 (×6): 1 via ORAL
  Filled 2012-12-05 (×6): qty 1

## 2012-12-05 NOTE — Progress Notes (Signed)
Subjective: Pain improved.  Having some stomach pain.  No BM since last week.    Objective: Vital signs in last 24 hours: Temp:  [97.4 F (36.3 C)-98.8 F (37.1 C)] 98.4 F (36.9 C) (03/31 0953) Pulse Rate:  [71-89] 86 (03/31 0953) Resp:  [16-19] 18 (03/31 0953) BP: (132-155)/(60-67) 144/63 mmHg (03/31 0953) SpO2:  [93 %-100 %] 100 % (03/31 0953) Last BM Date: 12/01/12  Intake/Output from previous day: 03/30 0701 - 03/31 0700 In: -  Out: 780 [Urine:600; Drains:30; Chest Tube:150] Intake/Output this shift: Total I/O In: 360 [P.O.:360] Out: 530 [Urine:500; Drains:30]  General appearance: alert, cooperative and no distress Resp: no distress Chest wall: left sided chest wall tenderness, tenderness at chest tube site Extremities: extremities normal, atraumatic, no cyanosis or edema  Lab Results:   Recent Labs  12/02/12 2100 12/03/12 0155  WBC 9.3 7.6  HGB 11.4* 11.1*  HCT 34.0* 33.6*  PLT 250 220   BMET  Recent Labs  12/02/12 2100 12/03/12 0155  NA 140 139  K 3.9 3.8  CL 103 104  CO2 31 28  GLUCOSE 116* 114*  BUN 12 10  CREATININE 0.69 0.69  0.68  CALCIUM 8.7 8.3*   PT/INR No results found for this basename: LABPROT, INR,  in the last 72 hours ABG No results found for this basename: PHART, PCO2, PO2, HCO3,  in the last 72 hours  Studies/Results: Dg Chest Port 1 View  12/05/2012  *RADIOLOGY REPORT*  Clinical Data: Right pneumothorax.  PORTABLE CHEST - 1 VIEW  Comparison: Same date.  Findings: Right-sided chest tube is unchanged in position. Small right apical pneumothorax is noted.  Subcutaneous emphysema is seen in right supraclavicular region which is unchanged.  Left lung is clear. Mild right basilar opacity is noted concerning for effusion or subsegmental atelectasis. The right-sided Port-A-Cath is again noted.  IMPRESSION: Mild right apical pneumothorax is noted. Mild right basilar opacity is noted concerning for effusion or subsegmental atelectasis.  Right-sided chest tube is unchanged in position.   Original Report Authenticated By: Lupita Raider.,  M.D.    Dg Chest Port 1 View  12/05/2012  *RADIOLOGY REPORT*  Clinical Data: Right pneumothorax, chest tube.  PORTABLE CHEST - 1 VIEW  Comparison: 12/04/2012 and CT chest 12/02/2012.  Findings: Trachea is midline.  Heart size stable.  A small amount of pleural air is seen at the base of the right hemithorax, with right chest tube in place.  Right subclavian Port-A-Cath tip projects over the SVC.  Mild bibasilar volume loss.  Lungs are otherwise clear.  Subcutaneous emphysema is seen in the neck and right chest wall. There may be a small air-fluid level in the right axillary region. A drain, presumably superficial in nature, it is seen in the left axilla, where there are also surgical clips.  IMPRESSION:  1.  Small right basilar pneumothorax with right chest tube in place. 2.  Mild bibasilar volume loss, stable. 3.  Subcutaneous emphysema with a possible small air-fluid level in the right axilla.   Original Report Authenticated By: Leanna Battles, M.D.    Dg Chest Port 1 View  12/04/2012  *RADIOLOGY REPORT*  Clinical Data: Evaluate right pneumothorax.  PORTABLE CHEST - 1 VIEW  Comparison: 12/03/2012  Findings: Stable position of the right chest tube. Again noted is pleural air along the right lung apex.  There may also be a medial basilar component to the pneumothorax.  Right basilar densities are suggestive for atelectasis.  The Port-A-Cath tip is near  the cavoatrial junction.  Few densities at the left lung base are suggestive for atelectasis.  Again noted is bilateral subcutaneous gas.  IMPRESSION: The right pneumothorax appears to be similar in configuration and size.  There may be a basilar component and a right apical component.   Original Report Authenticated By: Richarda Overlie, M.D.     Anti-infectives: Anti-infectives   None      Assessment/Plan: s/p * No surgery found * Right PTX Chest tube.   Small residual PTX that is stable.  Appears loculated. Will turn suction down to -20. If stable tomorrow will do water seal. If stable Wednesday will pull chest tube. Hope for d/c Thursday. Abd pain likely constipation.   Will add stool softeners. CXR tomorrow.    LOS: 3 days    [facility] 12/05/2012

## 2012-12-06 ENCOUNTER — Inpatient Hospital Stay (HOSPITAL_COMMUNITY): Payer: Medicare Other

## 2012-12-06 DIAGNOSIS — J438 Other emphysema: Secondary | ICD-10-CM | POA: Diagnosis not present

## 2012-12-06 DIAGNOSIS — J9819 Other pulmonary collapse: Secondary | ICD-10-CM | POA: Diagnosis not present

## 2012-12-06 DIAGNOSIS — Z4682 Encounter for fitting and adjustment of non-vascular catheter: Secondary | ICD-10-CM | POA: Diagnosis not present

## 2012-12-06 NOTE — Progress Notes (Signed)
Patient ID: Cynthia Griffin, female   DOB: 12-Sep-1945, 67 y.o.   MRN: [id number]    Subjective: Had large BM yesterday that helped with abd pain.   Objective: Vital signs in last 24 hours: Temp:  [98.2 F (36.8 C)-98.5 F (36.9 C)] 98.2 F (36.8 C) (04/01 0629) Pulse Rate:  [81-102] 81 (04/01 0629) Resp:  [18] 18 (04/01 0629) BP: (145-160)/(67-85) 145/67 mmHg (04/01 0629) SpO2:  [98 %-100 %] 98 % (04/01 0629) Last BM Date: 12/05/12  Intake/Output from previous day: 03/31 0701 - 04/01 0700 In: 830 [P.O.:600; I.V.:230] Out: 1005 [Urine:900; Drains:30; Chest Tube:75] Intake/Output this shift: Total I/O In: -  Out: 20 [Drains:20]  General appearance: alert, cooperative and no distress Resp: no distress Chest wall: left sided chest wall tenderness, tenderness at chest tube site Extremities: extremities normal, atraumatic, no cyanosis or edema  Lab Results:  No results found for this basename: WBC, HGB, HCT, PLT,  in the last 72 hours BMET No results found for this basename: NA, K, CL, CO2, GLUCOSE, BUN, CREATININE, CALCIUM,  in the last 72 hours PT/INR No results found for this basename: LABPROT, INR,  in the last 72 hours ABG No results found for this basename: PHART, PCO2, PO2, HCO3,  in the last 72 hours  Studies/Results: Dg Chest Port 1 View  12/06/2012  *RADIOLOGY REPORT*  Clinical Data: Shortness of breath.  Evaluate chest tube.  PORTABLE CHEST - 1 VIEW  Comparison: 1 day prior  Findings: Right-sided chest tube is unchanged in position.  The right apical pneumothorax is decreased in size, measuring less than 5%.  Right-sided Port-A-Cath is unchanged in position with tip at low SVC.  Surgical drain projects over the left axilla. Normal heart size. Trace right-sided pleural fluid.  Improved right base aeration with minimal subsegmental atelectasis remaining.  Persistent subcutaneous emphysema about the right supraclavicular region.  IMPRESSION: Right-sided chest tube  remaining in place, with decrease in less than 5% right apical pneumothorax.  Improved right base aeration with persistent patchy atelectasis and trace right pleural fluid.   Original Report Authenticated By: Jeronimo Greaves, M.D.    Dg Chest Port 1 View  12/05/2012  *RADIOLOGY REPORT*  Clinical Data: Right pneumothorax.  PORTABLE CHEST - 1 VIEW  Comparison: Same date.  Findings: Right-sided chest tube is unchanged in position. Small right apical pneumothorax is noted.  Subcutaneous emphysema is seen in right supraclavicular region which is unchanged.  Left lung is clear. Mild right basilar opacity is noted concerning for effusion or subsegmental atelectasis. The right-sided Port-A-Cath is again noted.  IMPRESSION: Mild right apical pneumothorax is noted. Mild right basilar opacity is noted concerning for effusion or subsegmental atelectasis. Right-sided chest tube is unchanged in position.   Original Report Authenticated By: Lupita Raider.,  M.D.    Dg Chest Port 1 View  12/05/2012  *RADIOLOGY REPORT*  Clinical Data: Right pneumothorax, chest tube.  PORTABLE CHEST - 1 VIEW  Comparison: 12/04/2012 and CT chest 12/02/2012.  Findings: Trachea is midline.  Heart size stable.  A small amount of pleural air is seen at the base of the right hemithorax, with right chest tube in place.  Right subclavian Port-A-Cath tip projects over the SVC.  Mild bibasilar volume loss.  Lungs are otherwise clear.  Subcutaneous emphysema is seen in the neck and right chest wall. There may be a small air-fluid level in the right axillary region. A drain, presumably superficial in nature, it is seen in the left axilla, where there  are also surgical clips.  IMPRESSION:  1.  Small right basilar pneumothorax with right chest tube in place. 2.  Mild bibasilar volume loss, stable. 3.  Subcutaneous emphysema with a possible small air-fluid level in the right axilla.   Original Report Authenticated By: Leanna Battles, M.D.      Anti-infectives: Anti-infectives   None      Assessment/Plan:  Right PTX Chest tube.  Improved CXR Chest tube to water seal today. If stable Wednesday will pull chest tube. Hope for d/c Thursday. Abd pain likely constipation.   Will add stool softeners. CXR tomorrow.    LOS: 4 days    [facility] 12/06/2012

## 2012-12-07 ENCOUNTER — Telehealth (INDEPENDENT_AMBULATORY_CARE_PROVIDER_SITE_OTHER): Payer: Self-pay

## 2012-12-07 ENCOUNTER — Inpatient Hospital Stay (HOSPITAL_COMMUNITY): Payer: Medicare Other

## 2012-12-07 ENCOUNTER — Other Ambulatory Visit (INDEPENDENT_AMBULATORY_CARE_PROVIDER_SITE_OTHER): Payer: Self-pay | Admitting: General Surgery

## 2012-12-07 ENCOUNTER — Ambulatory Visit: Payer: Medicare Other | Admitting: Oncology

## 2012-12-07 DIAGNOSIS — J9383 Other pneumothorax: Secondary | ICD-10-CM | POA: Diagnosis not present

## 2012-12-07 DIAGNOSIS — J90 Pleural effusion, not elsewhere classified: Secondary | ICD-10-CM | POA: Diagnosis not present

## 2012-12-07 DIAGNOSIS — Z8709 Personal history of other diseases of the respiratory system: Secondary | ICD-10-CM

## 2012-12-07 DIAGNOSIS — C50019 Malignant neoplasm of nipple and areola, unspecified female breast: Secondary | ICD-10-CM | POA: Diagnosis not present

## 2012-12-07 DIAGNOSIS — J95811 Postprocedural pneumothorax: Secondary | ICD-10-CM | POA: Diagnosis not present

## 2012-12-07 NOTE — Progress Notes (Signed)
ID: Cynthia Griffin   DOB: Mar 16, 1946  MR#: [id number]  UXL#:[id number]  PCP: Barbette Reichmann, MD GYN:  SUAlmond Lint OTHER MD: Dorothy Puffer, Jeralyn Ruths  ALERT: Another patient with the same date of birth, middle initial, and also with a history of breast cancer is in actinic. Please make sure when dealing with Cynthia L. Ficek do have the correct medical record number  HISTORY OF PRESENT ILLNESS: The patient tells me she had an unremarkable screening mammogram January of 2013 in [place]. I do not have that report. Shortly after that, February of 2013, she noted that her left nipple had become inverted. She initially thought this was due to the mammogram itself, but eventually brought it to her primary care physician's attention. This was followed and there was no apparent change when she was next seen December 2013. The patient however became concerned and decided to go back to [facility] for her annual mammography This is where she had her annual mammograms until the last couple of years. Bilateral diagnostic mammography and left ultrasonography there 10/24/2012 showed definite nipple deformity with inversion and retraction. There was subtle architectural distortion and increase in density relative to the preceding study. Left breast ultrasonography in addition to the nipple retraction and inversion showed an area of hyperechoic architectural distortion measuring 1.4 cm in diameter with Doppler flow. Breast specific gamma imaging was obtained February 2024 and confirmed an area of moderate intensity isotope activity following a radial distribution from the left nipple measuring at least 4 cm. 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  Bilateral breast MRI was obtained 10/31/2012 and showed an area of  diffuse non-masslike asymmetric enhancement in the inferior aspect of the left breast measuring up to 7.4 cm transversely. There were no enlarged axillary or internal mammary nodes noted and the right breast was unremarkable. The patient's subsequent history is as detailed below.  INTERVAL HISTORY: Cynthia Griffin was seen in her room with chest tube in place following PTX after port placement. Since her last visit with me she also had her definitive Left mastectomy and ALND  REVIEW OF SYSTEMS:  She did well with the mastectomy but developed a PTX with the port placement; requiring CT placement and prolonguing her hospitaization. Currently pain is a little better and she is beginning to be able to walk. A detailed review of systems was negative except for the PTX and mild post-operative pain  PAST MEDICAL HISTORY: Past Medical History  Diagnosis Date  . Breast cancer   . Arthritis   . Anxiety   . Carpal tunnel syndrome of right wrist   . Family history of anesthesia complication     pneumothorax - post op 1980's    PAST SURGICAL HISTORY: Past Surgical History  Procedure Laterality Date  . Abdominal hysterectomy    . Appendectomy    . Tonsillectomy    . Meniscus repair      Right Knee  . Mastectomy w/ sentinel node biopsy Left 11/14/2012    Procedure: MASTECTOMY WITH SENTINEL LYMPH NODE BIOPSY;  Surgeon: Almond Lint, MD;  Location: [facility];  Service: General;  Laterality: Left;  . Axillary lymph node dissection Left 12/01/2012    Procedure: AXILLARY LYMPH NODE DISSECTION;  Surgeon: Almond Lint, MD;  Location: [facility];  Service: General;  Laterality: Left;  . Portacath placement  Right 12/01/2012    Procedure: INSERTION PORT-A-CATH;  Surgeon: Almond Lint, MD;  Location: [facility];  Service: General;  Laterality: Right;   right shoulder rotator cuff repair, right hand carpal tunnel syndrome, bilateral salpingo--oophorectomy with her hysterectomy in the 1990s, Lasix  surgery, right toe surgery  FAMILY HISTORY Family History  Problem Relation Age of Onset  . Bladder Cancer Father   . Lung cancer Father    the patient's father died at the age of 52. He was diagnosed with lung cancer the age of 23 and with bladder cancer at the age of 45. He was a smoker. The patient's mother is living at age 35. She has a history of basal cell carcinoma. Nathan had no brothers, one sister. There is no history of breast or ovarian cancer in the immediate family.  GYNECOLOGIC HISTORY: Menarche age 59, first live birth age 66, she is GX P2. She had her hysterectomy she thinks in 1992, and took hormone replacement for approximately 18 years. In addition she took birth control pills between 1968 and 1977 stopping only to conceive.  SOCIAL HISTORY: She is a retired Environmental health practitioner. Her husband Cynthia Griffin (goes by Cynthia Griffin) is in Airline pilot for at [employer] in [place]. Son Cynthia Griffin lives in [place] and works in Chief Financial Officer. Son Cynthia Griffin lives in [place] and works in Airline pilot. The patient has 5 grandchildren" one on the way".   ADVANCED DIRECTIVES: Not in place  HEALTH MAINTENANCE: History  Substance Use Topics  . Smoking status: Former Smoker    Quit date: 09/07/1992  . Smokeless tobacco: Not on file  . Alcohol Use: 4.2 oz/week    7 Glasses of wine per week     Colonoscopy: August 2003  PAP: Status post hysterectomy  Bone density: Never  Lipid panel:  Allergies  Allergen Reactions  . Sulfa Antibiotics Nausea Only and Other (See Comments)    Stomach cramps    Current Facility-Administered Medications  Medication Dose Route Frequency Provider Last Rate Last Dose  . bisacodyl (DULCOLAX) EC tablet 10 mg  10 mg Oral Daily Almond Lint, MD   10 mg at 12/05/12 1302  . citalopram (CELEXA) tablet 40 mg  40 mg Oral Daily Caleen Essex III, MD   40 mg at 12/06/12 1036  . cyclobenzaprine (FLEXERIL) tablet 5 mg  5 mg Oral TID PRN  Caleen Essex III, MD   5 mg at 12/05/12 2255  . dextrose 5 % and 0.9 % NaCl with KCl 20 mEq/L infusion   Intravenous Continuous Almond Lint, MD 20 mL/hr at 12/05/12 1000    . enoxaparin (LOVENOX) injection 40 mg  40 mg Subcutaneous Q24H Caleen Essex III, MD   40 mg at 12/06/12 1237  . HYDROcodone-acetaminophen (NORCO/VICODIN) 5-325 MG per tablet 1-2 tablet  1-2 tablet Oral Q4H PRN Caleen Essex III, MD   2 tablet at 12/07/12 0259  . morphine 4 MG/ML injection 4 mg  4 mg Intravenous Q1H PRN Caleen Essex III, MD   4 mg at 12/03/12 1204  . ondansetron (ZOFRAN) injection 4 mg  4 mg Intravenous Q6H PRN Caleen Essex III, MD      . pantoprazole (PROTONIX) EC tablet 40 mg  40 mg Oral QHS Caleen Essex III, MD   40 mg at 12/06/12 2130  . senna-docusate (Senokot-S) tablet 1 tablet  1 tablet Oral BID Almond Lint, MD   1 tablet at 12/06/12 2130  OBJECTIVE: Middle-aged white woman examined in bed  Filed Vitals:   12/07/12 0600  BP: 137/69  Pulse: 76  Temp: 98.3 F (36.8 C)  Resp: 16     Body mass index is 25.79 kg/(m^2).    ECOG FS: 2  Port is in place with mild swelling, no erythema, mild tenderness  LAB RESULTS: Lab Results  Component Value Date   WBC 7.6 12/03/2012   NEUTROABS 6.3 12/02/2012   HGB 11.1* 12/03/2012   HCT 33.6* 12/03/2012   MCV 88.0 12/03/2012   PLT 220 12/03/2012      Chemistry      Component Value Date/Time   NA 139 12/03/2012 0155   NA 143 11/02/2012 1156      Component Value Date/Time   CALCIUM 8.3* 12/03/2012 0155   CALCIUM 9.3 11/02/2012 1156       Lab Results  Component Value Date   LABCA2 14 11/02/2012    No components found with this basename: [id number]    No results found for this basename: INR,  in the last 168 hours  Urinalysis No results found for this basename: colorurine,  appearanceur,  labspec,  phurine,  glucoseu,  hgbur,  bilirubinur,  ketonesur,  proteinur,  urobilinogen,  nitrite,  leukocytesur    STUDIES:. Ct Angio Chest W/cm &/or Wo  Cm  12/02/2012  *RADIOLOGY REPORT*  Clinical Data: Facial swelling, chest pain, breast cancer post surgery/lymph node resection  CT ANGIOGRAPHY CHEST  Technique:  Multidetector CT imaging of the chest using the standard protocol during bolus administration of intravenous contrast. Multiplanar reconstructed images including MIPs were obtained and reviewed to evaluate the vascular anatomy.  Contrast: OMNIPAQUE IOHEXOL 350 MG/ML SOLN  Comparison: None.  Findings: Large right pneumothorax with sub total collapse of right lung. Mediastinal shift right to left consistent with tension pneumothorax. Dr. Preston Fleeting immediately notified of this finding.  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 Minimal atelectasis in lingula. Right jugular Port-A-Cath with tip in SVC. Aorta normal caliber without aneurysm or dissection. Pulmonary arteries patent. No evidence of pulmonary embolism. Remaining left lung clear. Surgical drain left axilla. Sclerosis identified anterior aspect of T10 vertebral body inferiorly may represent discogenic sclerosis though difficult to completely exclude sclerotic metastasis. Bulky endplate spurs at the T7-T8 significantly narrow the spinal canal.  IMPRESSION: Right tension pneumothorax with mediastinal shift right to left; Dr. Preston Fleeting immediately notified of this finding at 2312 hours on 12/02/2012. Sub total atelectasis right lung. 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 Sclerosis inferior T10 suspect discogenic though difficult to completely exclude sclerotic metastasis.   Original Report Authenticated By: Ulyses Southward, M.D.    Nm Sentinel Node Inj-no Rpt (breast)  11/14/2012  CLINICAL DATA: left breast cancer   Sulfur colloid was injected intradermally by the nuclear medicine  technologist for breast cancer sentinel node localization.     Dg Chest Port 1 View  12/06/2012   *RADIOLOGY REPORT*  Clinical Data: Shortness of breath.  Evaluate chest tube.  PORTABLE CHEST - 1 VIEW  Comparison: 1 day prior  Findings: Right-sided chest tube is unchanged in position.  The right apical pneumothorax is decreased in size, measuring less than 5%.  Right-sided Port-A-Cath is unchanged in position with tip at low SVC.  Surgical drain projects over the left axilla. Normal heart size. Trace right-sided pleural fluid.  Improved right base aeration with minimal subsegmental atelectasis remaining.  Persistent subcutaneous emphysema about the  right supraclavicular region.  IMPRESSION: Right-sided chest tube remaining in place, with decrease in less than 5% right apical pneumothorax.  Improved right base aeration with persistent patchy atelectasis and trace right pleural fluid.   Original Report Authenticated By: Jeronimo Greaves, M.D.    Dg Chest Port 1 View  12/05/2012  *RADIOLOGY REPORT*  Clinical Data: Right pneumothorax.  PORTABLE CHEST - 1 VIEW  Comparison: Same date.  Findings: Right-sided chest tube is unchanged in position. Small right apical pneumothorax is noted.  Subcutaneous emphysema is seen in right supraclavicular region which is unchanged.  Left lung is clear. Mild right basilar opacity is noted concerning for effusion or subsegmental atelectasis. The right-sided Port-A-Cath is again noted.  IMPRESSION: Mild right apical pneumothorax is noted. Mild right basilar opacity is noted concerning for effusion or subsegmental atelectasis. Right-sided chest tube is unchanged in position.   Original Report Authenticated By: Lupita Raider.,  M.D.    Dg Chest Port 1 View  12/05/2012  *RADIOLOGY REPORT*  Clinical Data: Right pneumothorax, chest tube.  PORTABLE CHEST - 1 VIEW  Comparison: 12/04/2012 and CT chest 12/02/2012.  Findings: Trachea is midline.  Heart size stable.  A small amount of pleural air is seen at the base of the right hemithorax, with right chest tube in place.  Right subclavian  Port-A-Cath tip projects over the SVC.  Mild bibasilar volume loss.  Lungs are otherwise clear.  Subcutaneous emphysema is seen in the neck and right chest wall. There may be a small air-fluid level in the right axillary region. A drain, presumably superficial in nature, it is seen in the left axilla, where there are also surgical clips.  IMPRESSION:  1.  Small right basilar pneumothorax with right chest tube in place. 2.  Mild bibasilar volume loss, stable. 3.  Subcutaneous emphysema with a possible small air-fluid level in the right axilla.   Original Report Authenticated By: Leanna Battles, M.D.    Dg Chest Port 1 View  12/04/2012  *RADIOLOGY REPORT*  Clinical Data: Evaluate right pneumothorax.  PORTABLE CHEST - 1 VIEW  Comparison: 12/03/2012  Findings: Stable position of the right chest tube. Again noted is pleural air along the right lung apex.  There may also be a medial basilar component to the pneumothorax.  Right basilar densities are suggestive for atelectasis.  The Port-A-Cath tip is near the cavoatrial junction.  Few densities at the left lung base are suggestive for atelectasis.  Again noted is bilateral subcutaneous gas.  IMPRESSION: The right pneumothorax appears to be similar in configuration and size.  There may be a basilar component and a right apical component.   Original Report Authenticated By: Richarda Overlie, M.D.    Dg Chest Port 1 View  12/03/2012  *RADIOLOGY REPORT*  Clinical Data: Tension pneumothorax and pneumomediastinum, treated with right-sided chest tube on 12/02/2012.  PORTABLE CHEST - 1 VIEW  Comparison: 12/02/2012  Findings: Right-sided chest tube is in place.  There is a 5% right pneumothorax.  Subcutaneous emphysema is again noted along the neck, and linear lucencies along the mediastinum indicate some residual pneumomediastinum.  A band of atelectasis persists at the right lung base.  Right Port- A-Cath tip:  lower SVC.  Cardiothoracic index 59% on AP projection, suggesting  mild cardiomegaly.  IMPRESSION:  1.  Right chest tube in place.  5% right-sided pneumothorax.  Small amount of residual pneumomediastinum.  No tension component of the pneumothorax. 2.  Stable band of atelectasis at the right lung base. 3.  Mild cardiomegaly.   Original Report Authenticated By: Gaylyn Rong, M.D.    Dg Chest Portable 1 View  12/03/2012  *RADIOLOGY REPORT*  Clinical Data: Pneumothorax. Post chest tube insertion.  PORTABLE CHEST - 1 VIEW  Comparison: Chest CT 12/02/2012.  Findings: There has been interval placement of a right-sided chest tube with significant decrease in size of the right-sided pneumothorax which now appears to be a small (likely partially loculated at the right base).  There is no definite right to left midline shift at this time suggesting resolution of prior tension. Linear opacity in the right mid and lower lung as compatible with some subsegmental atelectasis.  Left lung is clear.  No pleural effusions.  Pulmonary vasculature is within normal limits.  Heart size is normal.  Mediastinal contours are slightly distorted by patient positioning.  Right-sided single-lumen Port-A-Cath with tip terminating in the distal superior vena cava.  Extensive subcutaneous emphysema in the right chest wall, and extensive subcutaneous emphysema throughout the cervical regions bilaterally. A surgical drain is noted in the soft tissues of the left chest wall.  IMPRESSION: 1.  Interval placement of right-sided chest tube which appears to be properly located with near complete resolution of the previously noted right-sided tension pneumothorax. 2.  Additional incidental findings, as above.   Original Report Authenticated By: Trudie Reed, M.D.    Dg Chest Port 1 View  12/01/2012  *RADIOLOGY REPORT*  Clinical Data: 67 year old female status post Port-A-Cath placement.  PORTABLE CHEST - 1 VIEW  Comparison: None.  Findings: Semi upright AP portable view at 1050 hours.  Right chest subclavian  approach Port-A-Cath placed.  Catheter tip not clearly delineated, but probably terminates in the cavoatrial junction region.  No pneumothorax on the right.  There is patchy increased right basilar opacity.  Postoperative changes to the left axilla with mild subcutaneous chest wall gas.  Tubing projecting over the left chest may be a superficial surgical drain.  Visualized tracheal air column is within normal limits.  No pulmonary edema or pleural effusion identified.  Increased retrocardiac opacity also at the left base, but this might be related to the chest wall artifact, uncertain.  IMPRESSION: 1.  Right chest Port-A-Cath placed with no adverse features. 2.  Postoperative changes to the left chest wall and axilla. 3.  Patchy right greater than left lung base opacity.  Perhaps this is atelectasis but aspiration is difficult to exclude.  Follow-up films recommended if there are pulmonary symptoms.   Original Report Authenticated By: Erskine Speed, M.D.    Dg Fluoro Guide Cv Line-no Report  12/01/2012  CLINICAL DATA: port placement   FLOURO GUIDE CV LINE  Fluoroscopy was utilized by the requesting physician.  No radiographic  interpretation.     ASSESSMENT: 67 y.o. [place] woman status post left mastectomy and sentinel lymph node sampling 11/14/2012 (with completion Left axillary node dissection 12/01/2012) for a pT3 N2, stage III invasive.lobular carcinoma, estrogen receptor 100% positive, progesterone receptor and HER-2 negative, with an MIB-1 of 8%.  PLAN: I gave Rose a copy of her path and oriented her to the overall plan, which will consist of chemotherapy (TAC x6) followed by post-mastectomy irradiation, then antiestrogens for 10 years. I have made her an appointment to see me 4/11 to operationalize the chemotherapy decision and she wil have a staging PERT scan before that visit. Appreciate your help to this patient!   MAGRINAT,GUSTAV C    12/07/2012

## 2012-12-07 NOTE — Telephone Encounter (Signed)
Gave pt's po appt to her husband.  Also instructed him to take her for a chest x-ray 2 days prior for hx of pneumothorax.

## 2012-12-07 NOTE — Progress Notes (Signed)
Patient ID: Cynthia Griffin, female   DOB: 05-Aug-1946, 67 y.o.   MRN: [id number]    Subjective: Still with some RLQ abd pain, but better than before.  No SOB on water seal.    Objective: Vital signs in last 24 hours: Temp:  [98.3 F (36.8 C)-98.4 F (36.9 C)] 98.3 F (36.8 C) (04/02 0600) Pulse Rate:  [76-82] 76 (04/02 0600) Resp:  [16-18] 16 (04/02 0600) BP: (137-138)/(69-74) 137/69 mmHg (04/02 0600) SpO2:  [93 %-98 %] 93 % (04/02 0600) Last BM Date: 12/06/12  Intake/Output from previous day: 04/01 0701 - 04/02 0700 In: 1460 [P.O.:1300; I.V.:160] Out: 1440 [Urine:1400; Drains:40] Intake/Output this shift:    General appearance: alert, cooperative and no distress Resp: no distress Chest wall: left sided chest wall tenderness, tenderness at chest tube site Extremities: extremities normal, atraumatic, no cyanosis or edema  Lab Results:  No results found for this basename: WBC, HGB, HCT, PLT,  in the last 72 hours BMET No results found for this basename: NA, K, CL, CO2, GLUCOSE, BUN, CREATININE, CALCIUM,  in the last 72 hours PT/INR No results found for this basename: LABPROT, INR,  in the last 72 hours ABG No results found for this basename: PHART, PCO2, PO2, HCO3,  in the last 72 hours  Studies/Results: Dg Chest Port 1 View  12/06/2012  *RADIOLOGY REPORT*  Clinical Data: Shortness of breath.  Evaluate chest tube.  PORTABLE CHEST - 1 VIEW  Comparison: 1 day prior  Findings: Right-sided chest tube is unchanged in position.  The right apical pneumothorax is decreased in size, measuring less than 5%.  Right-sided Port-A-Cath is unchanged in position with tip at low SVC.  Surgical drain projects over the left axilla. Normal heart size. Trace right-sided pleural fluid.  Improved right base aeration with minimal subsegmental atelectasis remaining.  Persistent subcutaneous emphysema about the right supraclavicular region.  IMPRESSION: Right-sided chest tube remaining in place, with  decrease in less than 5% right apical pneumothorax.  Improved right base aeration with persistent patchy atelectasis and trace right pleural fluid.   Original Report Authenticated By: Jeronimo Greaves, M.D.     Anti-infectives: Anti-infectives   None      Assessment/Plan:  Right PTX Chest tube.   Xray pending.  If stable today will pull chest tube. Hope for d/c Thursday. Abd pain likely constipation.   Will add stool softeners.    LOS: 5 days    [facility] 12/07/2012

## 2012-12-08 ENCOUNTER — Inpatient Hospital Stay (HOSPITAL_COMMUNITY): Payer: Medicare Other

## 2012-12-08 DIAGNOSIS — J95811 Postprocedural pneumothorax: Secondary | ICD-10-CM | POA: Diagnosis not present

## 2012-12-08 DIAGNOSIS — J90 Pleural effusion, not elsewhere classified: Secondary | ICD-10-CM | POA: Diagnosis not present

## 2012-12-08 NOTE — Discharge Summary (Signed)
Physician Discharge Summary  Patient ID: Cynthia Griffin MRN: [id number] DOB/AGE: 1946-04-18 67 y.o.  Admit date: 12/02/2012 Discharge date: 12/08/2012  Admission Diagnoses: Pneumothorax  Left breast cancer  Discharge Diagnoses:  Same  Discharged Condition: stable  Hospital Course:  Pt was admitted from ED following right chest tube placement by Dr. Carolynne Edouard.  She did not have complete resolution of PTX with chest tube, so suction was increased.  The PTX was dramatically smaller, so suction was turned back down.  There was tiny residual PTX that was stable.  The chest tube was placed on water seal and was improved.  The chest tube was pulled.  The AM xray the next day showed a tiny sliver of PTX, but pt asymptomatic.  Pt discharged to home with early follow up.  Consults: None  Significant Diagnostic Studies: radiology: CXR: see above  Treatments: Right tube thoracostomy  Discharge Exam: Blood pressure 142/62, pulse 70, temperature 97.9 F (36.6 C), temperature source Oral, resp. rate 17, height 5\' 5"  (1.651 m), weight 155 lb (70.308 kg), SpO2 93.00%. General appearance: alert, cooperative and no distress Resp: no respiratory distress Chest wall: no tenderness Cardio: regular rate and rhythm  Disposition: 01-Home or Self Care  Discharge Orders   Future Appointments Provider Department Dept Phone   12/19/2012 3:30 PM Almond Lint, MD [facility], [place] [phone]   02/06/2013 2:00 PM Baltazar Najjar [facility] [phone]   02/06/2013 3:00 PM Delcie Roch [facility] [phone]   Future Orders Complete By Expires     Diet - low sodium heart healthy  As directed     Discharge instructions  As directed     Comments:      Measure and record drain output twice daily.  Bring to follow up appt.    Increase activity slowly  As directed         Medication List    TAKE these medications       Biotin 5000 MCG Caps  Take 5,000 mcg by mouth daily.     calcium citrate-vitamin D 315-200 MG-UNIT per tablet  Commonly known as:  CITRACAL+D  Take 1 tablet by mouth daily.     citalopram 40 MG tablet  Commonly known as:  CELEXA  Take 40 mg by mouth daily.     cyclobenzaprine 5 MG tablet  Commonly known as:  FLEXERIL  Take 1 tablet (5 mg total) by mouth 3 (three) times daily as needed for muscle spasms.     GLUCOSAMINE CHONDR 1500 COMPLX PO  Take 1 capsule by mouth daily.     HYDROcodone-acetaminophen 5-325 MG per tablet  Commonly known as:  NORCO  Take 1-2 tablets by mouth every 4 (four) hours as needed for pain.     multivitamin with minerals Tabs  Take 1 tablet by mouth daily.           Follow-up Information   Follow up with [facility], MD In 2 weeks. (Will need chest xray prior to appointment.  Bernie at my office will make arrangements to get this.  )    Contact information:   [address] [place] [address] [phone]       Signed: Almond Lint 12/08/2012, 9:16 AM

## 2012-12-12 ENCOUNTER — Telehealth: Payer: Self-pay | Admitting: Oncology

## 2012-12-12 ENCOUNTER — Other Ambulatory Visit: Payer: Self-pay | Admitting: Oncology

## 2012-12-12 NOTE — Telephone Encounter (Signed)
S/W THE PT AND SHE IS AWARE OF HER 12/16/2012 @9 :00AM

## 2012-12-12 NOTE — Progress Notes (Signed)
No show--pt in hosp

## 2012-12-13 ENCOUNTER — Encounter (HOSPITAL_COMMUNITY)
Admission: RE | Admit: 2012-12-13 | Discharge: 2012-12-13 | Disposition: A | Discharge status: Home / Self Care | Payer: Medicare Other | Source: Ambulatory Visit | Attending: General Surgery | Admitting: General Surgery

## 2012-12-13 DIAGNOSIS — C50919 Malignant neoplasm of unspecified site of unspecified female breast: Secondary | ICD-10-CM | POA: Diagnosis present

## 2012-12-13 DIAGNOSIS — C50912 Malignant neoplasm of unspecified site of left female breast: Secondary | ICD-10-CM

## 2012-12-13 MED ORDER — FLUDEOXYGLUCOSE F - 18 (FDG) INJECTION
18.7000 | Freq: Once | INTRAVENOUS | Status: AC | PRN
  Administered 2012-12-13: 18.7 via INTRAVENOUS

## 2012-12-14 ENCOUNTER — Telehealth (INDEPENDENT_AMBULATORY_CARE_PROVIDER_SITE_OTHER): Payer: Self-pay | Admitting: *Deleted

## 2012-12-14 NOTE — Telephone Encounter (Signed)
Patient called to report a darker color to her drainage.  Patient states the drainage is becoming less.  Encouraged patient to continue to watch drainage and color and let us know if it continues changing.  Patient agreeable and states understanding at this time.  Patient has appt with Donell Beers MD on Monday.

## 2012-12-15 ENCOUNTER — Encounter (INDEPENDENT_AMBULATORY_CARE_PROVIDER_SITE_OTHER): Payer: Self-pay | Admitting: Surgery

## 2012-12-15 ENCOUNTER — Ambulatory Visit (INDEPENDENT_AMBULATORY_CARE_PROVIDER_SITE_OTHER): Payer: Medicare Other | Admitting: Surgery

## 2012-12-15 VITALS — BP 138/70 | HR 84 | Temp 97.3°F | Resp 16 | Ht 65.0 in | Wt 156.0 lb

## 2012-12-15 DIAGNOSIS — C50319 Malignant neoplasm of lower-inner quadrant of unspecified female breast: Secondary | ICD-10-CM

## 2012-12-15 DIAGNOSIS — C50312 Malignant neoplasm of lower-inner quadrant of left female breast: Secondary | ICD-10-CM

## 2012-12-15 NOTE — Progress Notes (Signed)
[facility]  Cynthia Kin, MD,  FACS [address]  [address] [place], [place]    [address] Phone:  [phone] FAX:  [phone]   Re:   Cynthia Griffin DOB:   07-15-1946 MRN:   [id number]  Urgent Office  ASSESSMENT AND PLAN: 1.  Left breast cancer  S/p left mastectomy - Cynthia Griffin - 11/14/2012  S/P left axillary dissection/right subclavian port - Cynthia Griffin - 12/01/2012  Single drain left axilla - <20 cc per day - drain removed  She is to see Dr. Donell Griffin on 12/25/2012  2.  Right pneumothorax post port placement - required CT  Hospitalized at [facility] - 12/02/2012 - 12/08/2012  HISTORY OF PRESENT ILLNESS: Chief Complaint  Patient presents with  . URGENT    eval change in drainage color - left breast /p Cynthia Griffin is a 67 y.o. (DOB: February 13, 1946)  white  female who is a patient of HANDE,VISHWANATH, MD and comes to the urgent office today for change in color of left axillary drain.  She noticed about 2 days ago a change in color of her left axillary drainage.  It became green.  She had a PET scan on 12/13/2012, but I don't know why that would change serous fluid to green.  She has had no fever, there is some tenderness around the drain, but about what would be expected.  She can lift her left arm about 45 degrees.  She knows that she needs to work on this.  Past Medical History  Diagnosis Date  . Breast cancer   . Arthritis   . Anxiety   . Carpal tunnel syndrome of right wrist   . Family history of anesthesia complication     pneumothorax - post op 1980's   Social History: Husband is with patient.  PHYSICAL EXAM: BP 138/70  Pulse 84  Temp(Src) 97.3 F (36.3 C) (Temporal)  Resp 16  Ht 5\' 5"  (1.651 m)  Wt 156 lb (70.761 kg)  BMI 25.96 kg/m2  General: WN older WF who is alert and generally healthy appearing.  HEENT: Normal. Pupils equal.  Neck: Supple.  Lymph Nodes:  Left axilla "empty".  No redness and incisions look okay. Chest:   Absent left breast.  The drain coming from the left axilla has green fluid that smells like psuedomonas.  I don't see any infection in the wound, the drainage is less than 20 cc/day, and I think that she is best served getting the drain out.  She tolerated drain removal.  Right chest tube wound looks okay. Lungs: Clear to auscultation and symmetric breath sounds. Heart:  RRR. No murmur or rub.  DATA REVIEWED: Path report and Epic notes  Cynthia Kin, MD, FACS Office:  [phone]

## 2012-12-15 NOTE — Telephone Encounter (Signed)
Patient called again this morning to state that there still appears to be a change in the drainage color to a dark green.  Patient scheduled in urgent office today.

## 2012-12-16 ENCOUNTER — Ambulatory Visit
Admission: RE | Admit: 2012-12-16 | Discharge: 2012-12-16 | Disposition: A | Discharge status: Home / Self Care | Payer: Medicare Other | Source: Ambulatory Visit | Attending: General Surgery | Admitting: General Surgery

## 2012-12-16 ENCOUNTER — Ambulatory Visit (HOSPITAL_BASED_OUTPATIENT_CLINIC_OR_DEPARTMENT_OTHER): Payer: Medicare Other | Admitting: Oncology

## 2012-12-16 ENCOUNTER — Telehealth: Payer: Self-pay | Admitting: *Deleted

## 2012-12-16 VITALS — BP 129/77 | HR 120 | Temp 99.3°F | Resp 20 | Ht 65.0 in | Wt 157.2 lb

## 2012-12-16 DIAGNOSIS — Z8709 Personal history of other diseases of the respiratory system: Secondary | ICD-10-CM

## 2012-12-16 DIAGNOSIS — C50312 Malignant neoplasm of lower-inner quadrant of left female breast: Secondary | ICD-10-CM

## 2012-12-16 DIAGNOSIS — Z452 Encounter for adjustment and management of vascular access device: Secondary | ICD-10-CM | POA: Diagnosis not present

## 2012-12-16 DIAGNOSIS — C773 Secondary and unspecified malignant neoplasm of axilla and upper limb lymph nodes: Secondary | ICD-10-CM | POA: Diagnosis not present

## 2012-12-16 DIAGNOSIS — C50319 Malignant neoplasm of lower-inner quadrant of unspecified female breast: Secondary | ICD-10-CM

## 2012-12-16 DIAGNOSIS — C50912 Malignant neoplasm of unspecified site of left female breast: Secondary | ICD-10-CM | POA: Insufficient documentation

## 2012-12-16 DIAGNOSIS — Z170 Estrogen receptor positive status [ER+]: Secondary | ICD-10-CM

## 2012-12-16 MED ORDER — TOBRAMYCIN-DEXAMETHASONE 0.3-0.1 % OP SUSP: 1.0000 [drp] | Freq: Two times a day (BID) | OPHTHALMIC | Status: DC

## 2012-12-16 MED ORDER — PROCHLORPERAZINE MALEATE 10 MG PO TABS: 10.0000 mg | ORAL_TABLET | Freq: Four times a day (QID) | ORAL | Status: DC | PRN

## 2012-12-16 MED ORDER — LIDOCAINE-PRILOCAINE 2.5-2.5 % EX CREA: TOPICAL_CREAM | CUTANEOUS | Status: DC | PRN

## 2012-12-16 MED ORDER — DEXAMETHASONE 4 MG PO TABS: 4.0000 mg | ORAL_TABLET | Freq: Two times a day (BID) | ORAL | Status: DC

## 2012-12-16 MED ORDER — PROCHLORPERAZINE 25 MG RE SUPP: 25.0000 mg | Freq: Two times a day (BID) | RECTAL | Status: DC | PRN

## 2012-12-16 MED ORDER — LORAZEPAM 0.5 MG PO TABS: 0.5000 mg | ORAL_TABLET | Freq: Every evening | ORAL | Status: DC | PRN

## 2012-12-16 NOTE — Progress Notes (Signed)
ID: Cynthia Griffin   DOB: 06-Dec-1945  MR#: [id number]  WUJ#:[id number]  PCP: Barbette Reichmann, MD GYN:  SUAlmond Lint OTHER MD: Dorothy Puffer, Jeralyn Ruths  ALERT: Another patient with the same date of birth, middle initial, and also with a history of breast cancer is in actinic. Please make sure when dealing with Sydell L. Hakim do have the correct medical record number  HISTORY OF PRESENT ILLNESS: The patient tells me she had an unremarkable screening mammogram January of 2013 in [place]. I do not have that report. Shortly after that, February of 2013, she noted that her left nipple had become inverted. She initially thought this was due to the mammogram itself, but eventually brought it to her primary care physician's attention. This was followed and there was no apparent change when she was next seen December 2013. The patient however became concerned and decided to go back to [facility] for her annual mammography This is where she had her annual mammograms until the last couple of years. Bilateral diagnostic mammography and left ultrasonography there 10/24/2012 showed definite nipple deformity with inversion and retraction. There was subtle architectural distortion and increase in density relative to the preceding study. Left breast ultrasonography in addition to the nipple retraction and inversion showed an area of hyperechoic architectural distortion measuring 1.4 cm in diameter with Doppler flow. Breast specific gamma imaging was obtained February 2024 and confirmed an area of moderate intensity isotope activity following a radial distribution from the left nipple measuring at least 4 cm. 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  Bilateral breast MRI was obtained 10/31/2012 and showed an area of  diffuse non-masslike asymmetric enhancement in the inferior aspect of the left breast measuring up to 7.4 cm transversely. There were no enlarged axillary or internal mammary nodes noted and the right breast was unremarkable. The patient's subsequent history is as detailed below.  INTERVAL HISTORY: Cynthia Griffin returns to the breast clinic today accompanied by her husband Cynthia Griffin.; Since her last visit here she had her definitive surgery and port placement, the latter unfortunately complicated by pneumothorax which prolonged her hospital stay.  REVIEW OF SYSTEMS:  She still feels somewhat tired and certainly has some discomfort in the left area. She takes Vicodin occasionally for this. This does not constipate her. She is just starting back her walking program. She has very limited range of motion of the left upper extremity, which feels "tight" and uncomfortable on abduction. Hot flashes are minimal. Otherwise a detailed review of systems today was noncontributory   PAST MEDICAL HISTORY: Past Medical History  Diagnosis Date  . Breast cancer   . Arthritis   . Anxiety   . Carpal tunnel syndrome of right wrist   . Family history of anesthesia complication     pneumothorax - post op 1980's    PAST SURGICAL HISTORY: Past Surgical History  Procedure Laterality Date  . Abdominal hysterectomy    . Appendectomy    . Tonsillectomy    . Meniscus repair      Right Knee  . Mastectomy w/ sentinel node biopsy Left 11/14/2012    Procedure: MASTECTOMY WITH SENTINEL LYMPH NODE BIOPSY;  Surgeon: Almond Lint, MD;  Location: [facility];  Service: General;  Laterality: Left;  . Axillary lymph node dissection Left 12/01/2012    Procedure: AXILLARY LYMPH NODE DISSECTION;  Surgeon: Almond Lint, MD;  Location: [facility];  Service: General;  Laterality: Left;  . Portacath placement Right 12/01/2012    Procedure: INSERTION PORT-A-CATH;  Surgeon: Almond Lint, MD;  Location: [facility];  Service:  General;  Laterality: Right;   right shoulder rotator cuff repair, right hand carpal tunnel syndrome, bilateral salpingo--oophorectomy with her hysterectomy in the 1990s, Lasix surgery, right toe surgery  FAMILY HISTORY Family History  Problem Relation Age of Onset  . Bladder Cancer Father   . Lung cancer Father    the patient's father died at the age of 74. He was diagnosed with lung cancer the age of 26 and with bladder cancer at the age of 31. He was a smoker. The patient's mother is living at age 70. She has a history of basal cell carcinoma. Gearldine had no brothers, one sister. There is no history of breast or ovarian cancer in the immediate family.  GYNECOLOGIC HISTORY: Menarche age 75, first live birth age 6, she is GX P2. She had her hysterectomy she thinks in 1992, and took hormone replacement for approximately 18 years. In addition she took birth control pills between 1968 and 1977 stopping only to conceive.  SOCIAL HISTORY: She is a retired Environmental health practitioner. Her husband Cynthia Griffin (goes by Cynthia Griffin) is in Airline pilot for at [employer] in [place]. Son Cynthia Griffin lives in [place] and works in Chief Financial Officer. Son Cynthia Griffin lives in [place] and works in Airline pilot. The patient has 5 grandchildren" one on the way".   ADVANCED DIRECTIVES: Not in place  HEALTH MAINTENANCE: History  Substance Use Topics  . Smoking status: Former Smoker    Quit date: 09/07/1992  . Smokeless tobacco: Not on file  . Alcohol Use: 4.2 oz/week    7 Glasses of wine per week     Colonoscopy: August 2003  PAP: Status post hysterectomy  Bone density: Never  Lipid panel:  Allergies  Allergen Reactions  . Sulfa Antibiotics Nausea Only and Other (See Comments)    Stomach cramps    Current Outpatient Prescriptions  Medication Sig Dispense Refill  . Biotin 5000 MCG CAPS Take 5,000 mcg by mouth daily.      . calcium citrate-vitamin D (CITRACAL+D) 315-200 MG-UNIT per  tablet Take 1 tablet by mouth daily.      . citalopram (CELEXA) 40 MG tablet Take 40 mg by mouth daily.      . cyclobenzaprine (FLEXERIL) 5 MG tablet Take 1 tablet (5 mg total) by mouth 3 (three) times daily as needed for muscle spasms.  30 tablet  0  . Glucosamine-Chondroit-Vit C-Mn (GLUCOSAMINE CHONDR 1500 COMPLX PO) Take 1 capsule by mouth daily.      Marland Kitchen HYDROcodone-acetaminophen (NORCO) 5-325 MG per tablet Take 1-2 tablets by mouth every 4 (four) hours as needed for pain.  30 tablet  1  . Multiple Vitamin (MULTIVITAMIN WITH MINERALS) TABS Take 1 tablet by mouth daily.       No current facility-administered medications for this visit.    OBJECTIVE: Middle-aged white woman in no acute distress Filed Vitals:   12/16/12 1254  BP: 129/77  Pulse: 120  Temp: 99.3 F (37.4 C)  Resp: 20     Body mass index is 26.16 kg/(m^2).    ECOG FS: 1  Sclerae unicteric Oropharynx clear No cervical or supraclavicular adenopathy Lungs no rales or rhonchi Heart regular rate and rhythm Abd benign MSK no focal spinal tenderness, no left upper extremity  lymphedema; left upper extremity abduction is limited to approximately 40;  Neuro: nonfocal, well oriented, positive affect Breasts: The right breast is unremarkable. The left breast is status post mastectomy. The incisions are healing nicely, with Steri-Strips still in place.there is no unusual erythema, swelling, or dehiscence. The left axilla is benign.  LAB RESULTS: Lab Results  Component Value Date   WBC 7.6 12/03/2012   NEUTROABS 6.3 12/02/2012   HGB 11.1* 12/03/2012   HCT 33.6* 12/03/2012   MCV 88.0 12/03/2012   PLT 220 12/03/2012      Chemistry      Component Value Date/Time   NA 139 12/03/2012 0155   NA 143 11/02/2012 1156      Component Value Date/Time   CALCIUM 8.3* 12/03/2012 0155   CALCIUM 9.3 11/02/2012 1156       Lab Results  Component Value Date   LABCA2 14 11/02/2012    No components found with this basename: [id number]     No results found for this basename: INR,  in the last 168 hours  Urinalysis No results found for this basename: colorurine,  appearanceur,  labspec,  phurine,  glucoseu,  hgbur,  bilirubinur,  ketonesur,  proteinur,  urobilinogen,  nitrite,  leukocytesur    STUDIES: Dg Chest 2 View  12/16/2012  *RADIOLOGY REPORT*  Clinical Data: Pneumothorax.  CHEST - 2 VIEW  Comparison: December 08, 2012.  Findings: Cardiomediastinal silhouette appears normal.  Moderate dextroscoliosis of lower thoracic spine is again noted.  Right subclavian Port-A-Cath is noted with tip in expected position of the SVC.  Left axillary drain has been removed.  No pneumothorax is noted currently.  No acute pulmonary disease is noted.  IMPRESSION: No pneumothorax is seen.   Original Report Authenticated By: Lupita Raider.,  M.D.    Dg Chest 2 View  12/08/2012  *RADIOLOGY REPORT*  Clinical Data: Chest tube removal.  CHEST - 2 VIEW  Comparison: December 07, 2012.  Findings: Right-sided chest tube has been removed. Minimal right apical pneumothorax is noted which is slightly increased compared to prior exam.  Right-sided Port-A-Cath is unchanged in position. Minimal bilateral pleural effusions are noted.  No change is seen involving left axillary surgical drain.  IMPRESSION: Minimal right apical pneumothorax is noted which is slightly increased compared to prior exam following right-sided chest tube removal.  Minimal bilateral pleural effusions are noted.   Original Report Authenticated By: Lupita Raider.,  M.D.    Ct Angio Chest W/cm &/or Wo Cm  12/02/2012  *RADIOLOGY REPORT*  Clinical Data: Facial swelling, chest pain, breast cancer post surgery/lymph node resection  CT ANGIOGRAPHY CHEST  Technique:  Multidetector CT imaging of the chest using the standard protocol during bolus administration of intravenous contrast. Multiplanar reconstructed images including MIPs were obtained and reviewed to evaluate the vascular anatomy.  Contrast:  OMNIPAQUE IOHEXOL 350 MG/ML SOLN  Comparison: None.  Findings: Large right pneumothorax with sub total collapse of right lung. Mediastinal shift right to left consistent with tension pneumothorax. Dr. Preston Fleeting immediately notified of this finding.  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 Minimal atelectasis in lingula. Right jugular Port-A-Cath with tip in SVC. Aorta normal caliber without aneurysm or dissection. Pulmonary arteries patent. No evidence of pulmonary embolism. Remaining left lung clear. Surgical drain left axilla. Sclerosis identified anterior aspect of T10 vertebral body inferiorly may represent discogenic sclerosis though difficult to completely exclude sclerotic metastasis. Bulky endplate spurs at the T7-T8 significantly narrow the spinal canal.  IMPRESSION: Right tension pneumothorax with mediastinal shift right to left; Dr. Preston Fleeting immediately notified of this finding at 2312 hours on 12/02/2012. Sub total atelectasis right lung. 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 Sclerosis inferior T10 suspect discogenic though difficult to completely exclude sclerotic metastasis.   Original Report Authenticated By: Ulyses Southward, M.D.    Nm Pet Image Initial (pi) Skull Base To Thigh  12/13/2012  *RADIOLOGY REPORT*  Clinical Data: Initial treatment strategy for breast cancer.  NUCLEAR MEDICINE PET SKULL BASE TO THIGH  Fasting Blood Glucose:  94  Technique:  18.7 mCi F-18 FDG was injected intravenously. CT data was obtained and used for attenuation correction and anatomic localization only.  (This was not acquired as a diagnostic CT examination.) Additional exam technical data entered on technologist worksheet.  Comparison:  12/02/2012  Findings:  Neck: No hypermetabolic lymph nodes in the neck.  Low attenuation nodule in the left lobe of thyroid gland measures 1.2 cm.  This does not  exhibit any significant FDG uptake.  Chest:    Postsurgical changes noted within the left breast and left chest wall as well as the left axilla.  Surgical drain remains in place beneath the left pectoralis muscle. Small right pleural effusion is noted.  Tiny nodule in the left upper lobe measures 4 mm, image 77.  This is too small to characterize.  Nodule within the superior segment of the right lower lobe measures 7 mm, image number 88.  Although this is too small to reliably characterize by PET CT the SUV max associated this small nodule is equal 1.4.  The right upper lobe nodule measures 5 mm, image 82.  This is too small to characterize.  Abdomen/Pelvis:  No abnormal hypermetabolic activity within the liver, pancreas, adrenal glands, or spleen.  No hypermetabolic lymph nodes in the abdomen or pelvis.  Skeleton:  No focal hypermetabolic activity to suggest skeletal metastasis.  IMPRESSION:  1.  No acute findings identified.  There are no specific features to suggest hypermetabolic metastases. 2.  There are scattered sub centimeter nodules identified in both lungs.  The largest measures 7 mm.  These have a nonspecific appearance.  Close interval follow-up is advised. 3.  Small right pleural effusion. 4.  Nodule in left lobe of thyroid gland measures 1.2 cm.  This does not exhibit significant FDG uptake. Non-hypermetabolic thyroid nodules noted, which are likely benign in the absence of known clinical risk factors for thyroid carcinoma.   Original Report Authenticated By: Signa Kell, M.D.    Dg Chest Port 1 View  12/07/2012  *RADIOLOGY REPORT*  Clinical Data: Pneumothorax  PORTABLE CHEST - 1 VIEW  Comparison: December 06, 2012.  Findings: Right-sided chest tube remains and has been retracted slightly.  Minimal right apical pneumothorax is noted which is not significantly changed compared to prior exam.  Right subclavian Port-A-Cath is again noted and unchanged in position.  Left lung is clear.  Minimal right  pleural effusion is noted.  Surgical drain is again noted over left axillary region.  IMPRESSION: No significant change seen involving minimal right apical pneumothorax compared to prior exam.  Right chest tube remains. Minimal right pleural effusion is noted.   Original Report Authenticated By: Lupita Raider.,  M.D.    Dg Chest Port 1 View  12/06/2012  *RADIOLOGY REPORT*  Clinical Data: Shortness of breath.  Evaluate chest tube.  PORTABLE CHEST - 1 VIEW  Comparison: 1 day prior  Findings:  Right-sided chest tube is unchanged in position.  The right apical pneumothorax is decreased in size, measuring less than 5%.  Right-sided Port-A-Cath is unchanged in position with tip at low SVC.  Surgical drain projects over the left axilla. Normal heart size. Trace right-sided pleural fluid.  Improved right base aeration with minimal subsegmental atelectasis remaining.  Persistent subcutaneous emphysema about the right supraclavicular region.  IMPRESSION: Right-sided chest tube remaining in place, with decrease in less than 5% right apical pneumothorax.  Improved right base aeration with persistent patchy atelectasis and trace right pleural fluid.   Original Report Authenticated By: Jeronimo Greaves, M.D.    Dg Chest Port 1 View  12/05/2012  *RADIOLOGY REPORT*  Clinical Data: Right pneumothorax.  PORTABLE CHEST - 1 VIEW  Comparison: Same date.  Findings: Right-sided chest tube is unchanged in position. Small right apical pneumothorax is noted.  Subcutaneous emphysema is seen in right supraclavicular region which is unchanged.  Left lung is clear. Mild right basilar opacity is noted concerning for effusion or subsegmental atelectasis. The right-sided Port-A-Cath is again noted.  IMPRESSION: Mild right apical pneumothorax is noted. Mild right basilar opacity is noted concerning for effusion or subsegmental atelectasis. Right-sided chest tube is unchanged in position.   Original Report Authenticated By: Lupita Raider.,  M.D.     Dg Chest Port 1 View  12/05/2012  *RADIOLOGY REPORT*  Clinical Data: Right pneumothorax, chest tube.  PORTABLE CHEST - 1 VIEW  Comparison: 12/04/2012 and CT chest 12/02/2012.  Findings: Trachea is midline.  Heart size stable.  A small amount of pleural air is seen at the base of the right hemithorax, with right chest tube in place.  Right subclavian Port-A-Cath tip projects over the SVC.  Mild bibasilar volume loss.  Lungs are otherwise clear.  Subcutaneous emphysema is seen in the neck and right chest wall. There may be a small air-fluid level in the right axillary region. A drain, presumably superficial in nature, it is seen in the left axilla, where there are also surgical clips.  IMPRESSION:  1.  Small right basilar pneumothorax with right chest tube in place. 2.  Mild bibasilar volume loss, stable. 3.  Subcutaneous emphysema with a possible small air-fluid level in the right axilla.   Original Report Authenticated By: Leanna Battles, M.D.    Dg Chest Port 1 View  12/04/2012  *RADIOLOGY REPORT*  Clinical Data: Evaluate right pneumothorax.  PORTABLE CHEST - 1 VIEW  Comparison: 12/03/2012  Findings: Stable position of the right chest tube. Again noted is pleural air along the right lung apex.  There may also be a medial basilar component to the pneumothorax.  Right basilar densities are suggestive for atelectasis.  The Port-A-Cath tip is near the cavoatrial junction.  Few densities at the left lung base are suggestive for atelectasis.  Again noted is bilateral subcutaneous gas.  IMPRESSION: The right pneumothorax appears to be similar in configuration and size.  There may be a basilar component and a right apical component.   Original Report Authenticated By: Richarda Overlie, M.D.    Dg Chest Port 1 View  12/03/2012  *RADIOLOGY REPORT*  Clinical Data: Tension pneumothorax and pneumomediastinum, treated with right-sided chest tube on 12/02/2012.  PORTABLE CHEST - 1 VIEW  Comparison: 12/02/2012  Findings:  Right-sided chest tube is in place.  There is a 5% right pneumothorax.  Subcutaneous emphysema is again noted along the neck, and linear lucencies along the mediastinum indicate some residual pneumomediastinum.  A band of atelectasis persists  at the right lung base.  Right Port- A-Cath tip:  lower SVC.  Cardiothoracic index 59% on AP projection, suggesting mild cardiomegaly.  IMPRESSION:  1.  Right chest tube in place.  5% right-sided pneumothorax.  Small amount of residual pneumomediastinum.  No tension component of the pneumothorax. 2.  Stable band of atelectasis at the right lung base. 3.  Mild cardiomegaly.   Original Report Authenticated By: Gaylyn Rong, M.D.    Dg Chest Portable 1 View  12/03/2012  *RADIOLOGY REPORT*  Clinical Data: Pneumothorax. Post chest tube insertion.  PORTABLE CHEST - 1 VIEW  Comparison: Chest CT 12/02/2012.  Findings: There has been interval placement of a right-sided chest tube with significant decrease in size of the right-sided pneumothorax which now appears to be a small (likely partially loculated at the right base).  There is no definite right to left midline shift at this time suggesting resolution of prior tension. Linear opacity in the right mid and lower lung as compatible with some subsegmental atelectasis.  Left lung is clear.  No pleural effusions.  Pulmonary vasculature is within normal limits.  Heart size is normal.  Mediastinal contours are slightly distorted by patient positioning.  Right-sided single-lumen Port-A-Cath with tip terminating in the distal superior vena cava.  Extensive subcutaneous emphysema in the right chest wall, and extensive subcutaneous emphysema throughout the cervical regions bilaterally. A surgical drain is noted in the soft tissues of the left chest wall.  IMPRESSION: 1.  Interval placement of right-sided chest tube which appears to be properly located with near complete resolution of the previously noted right-sided tension  pneumothorax. 2.  Additional incidental findings, as above.   Original Report Authenticated By: Trudie Reed, M.D.    Dg Chest Port 1 View  12/01/2012  *RADIOLOGY REPORT*  Clinical Data: 67 year old female status post Port-A-Cath placement.  PORTABLE CHEST - 1 VIEW  Comparison: None.  Findings: Semi upright AP portable view at 1050 hours.  Right chest subclavian approach Port-A-Cath placed.  Catheter tip not clearly delineated, but probably terminates in the cavoatrial junction region.  No pneumothorax on the right.  There is patchy increased right basilar opacity.  Postoperative changes to the left axilla with mild subcutaneous chest wall gas.  Tubing projecting over the left chest may be a superficial surgical drain.  Visualized tracheal air column is within normal limits.  No pulmonary edema or pleural effusion identified.  Increased retrocardiac opacity also at the left base, but this might be related to the chest wall artifact, uncertain.  IMPRESSION: 1.  Right chest Port-A-Cath placed with no adverse features. 2.  Postoperative changes to the left chest wall and axilla. 3.  Patchy right greater than left lung base opacity.  Perhaps this is atelectasis but aspiration is difficult to exclude.  Follow-up films recommended if there are pulmonary symptoms.   Original Report Authenticated By: Erskine Speed, M.D.    Dg Fluoro Guide Cv Line-no Report  12/01/2012  CLINICAL DATA: port placement   FLOURO GUIDE CV LINE  Fluoroscopy was utilized by the requesting physician.  No radiographic  interpretation.      ASSESSMENT: 67 y.o. [place] woman status post left mastectomy and axillary lymph node dissection March 2014 for a pT3 pN2, stage IIIA invasive lobular carcinoma, grade 1, estrogen receptor 100% positive, progesterone receptor and HER-2 negative, with an MIB-1 of 8%.  (1) R pneumothorax with port placement March 2014- resolved  PLAN: Cedra is recovering well from her recent surgery and is  asymptomatic  as far as her pneumothorax is concerned. She is already scheduled for chest x-ray later today, which will be reviewed by Dr. Donell Beers Monday.  Today we talked about chemotherapy, which will consist of docetaxel, cyclophosphamide and doxorubicin ("TAC"). She is going to start April 15, which will work best with her planned trip to [place] late May. In fact her may 27th treatment will be delayed one day to may 28 because of that trip. I gave her a "map" on how to take all her supportive medicines and wrote her prescription for dexamethasone, prochlorperazine (both by mouth and parenchyma) TobraDex eyedrops, EMLA cream and lorazepam. I also set her up for an echocardiogram April 14 and 4 "chemotherapy school" the same day.  She will see [place] a week after her first treatment to troubleshoot any problems, and then she will have lab work a week later so we have a good baseline. After that we will see her on days 1 and 8 of each cycle. She will be finished with treatment, if there are no delays, at the end of July. After that she will receive postmastectomy radiation.  She knows to call for any problems that may develop before the next visit.  Loren Sawaya C    12/16/2012

## 2012-12-16 NOTE — Telephone Encounter (Signed)
Lm gv pt appts d/t and i also ask that she stop by the scheduler department to get her schedule printed...td

## 2012-12-19 ENCOUNTER — Ambulatory Visit (INDEPENDENT_AMBULATORY_CARE_PROVIDER_SITE_OTHER): Payer: Medicare Other | Admitting: General Surgery

## 2012-12-19 ENCOUNTER — Telehealth: Payer: Self-pay | Admitting: *Deleted

## 2012-12-19 ENCOUNTER — Other Ambulatory Visit: Payer: Medicare Other

## 2012-12-19 ENCOUNTER — Encounter (INDEPENDENT_AMBULATORY_CARE_PROVIDER_SITE_OTHER): Payer: Self-pay | Admitting: General Surgery

## 2012-12-19 ENCOUNTER — Ambulatory Visit (HOSPITAL_COMMUNITY)
Admission: RE | Admit: 2012-12-19 | Discharge: 2012-12-19 | Disposition: A | Discharge status: Home / Self Care | Payer: Medicare Other | Source: Ambulatory Visit | Attending: Internal Medicine | Admitting: Internal Medicine

## 2012-12-19 VITALS — BP 122/60 | HR 84 | Temp 98.8°F | Resp 18 | Ht 64.08 in | Wt 157.8 lb

## 2012-12-19 DIAGNOSIS — C50919 Malignant neoplasm of unspecified site of unspecified female breast: Secondary | ICD-10-CM | POA: Diagnosis not present

## 2012-12-19 DIAGNOSIS — Z87891 Personal history of nicotine dependence: Secondary | ICD-10-CM | POA: Diagnosis not present

## 2012-12-19 DIAGNOSIS — C50312 Malignant neoplasm of lower-inner quadrant of left female breast: Secondary | ICD-10-CM

## 2012-12-19 DIAGNOSIS — C50319 Malignant neoplasm of lower-inner quadrant of unspecified female breast: Secondary | ICD-10-CM

## 2012-12-19 DIAGNOSIS — Z01810 Encounter for preprocedural cardiovascular examination: Secondary | ICD-10-CM | POA: Insufficient documentation

## 2012-12-19 DIAGNOSIS — R9431 Abnormal electrocardiogram [ECG] [EKG]: Secondary | ICD-10-CM | POA: Diagnosis not present

## 2012-12-19 NOTE — Progress Notes (Signed)
  Echocardiogram 2D Echocardiogram has been performed.  Cynthia Griffin FRANCES 12/19/2012, 9:58 AM

## 2012-12-19 NOTE — Patient Instructions (Signed)
Get chemotherapy!  Follow up in 2-3 months.

## 2012-12-19 NOTE — Telephone Encounter (Signed)
Per staff message and POF I have scheduled appts.  Cynthia Griffin  

## 2012-12-20 ENCOUNTER — Encounter: Payer: Self-pay | Admitting: Physician Assistant

## 2012-12-20 ENCOUNTER — Other Ambulatory Visit (HOSPITAL_BASED_OUTPATIENT_CLINIC_OR_DEPARTMENT_OTHER): Payer: Medicare Other | Admitting: Lab

## 2012-12-20 ENCOUNTER — Ambulatory Visit (HOSPITAL_BASED_OUTPATIENT_CLINIC_OR_DEPARTMENT_OTHER): Payer: Medicare Other

## 2012-12-20 VITALS — BP 137/77 | HR 76 | Temp 98.4°F | Resp 18

## 2012-12-20 DIAGNOSIS — Z5111 Encounter for antineoplastic chemotherapy: Secondary | ICD-10-CM

## 2012-12-20 DIAGNOSIS — C50319 Malignant neoplasm of lower-inner quadrant of unspecified female breast: Secondary | ICD-10-CM

## 2012-12-20 DIAGNOSIS — C773 Secondary and unspecified malignant neoplasm of axilla and upper limb lymph nodes: Secondary | ICD-10-CM | POA: Diagnosis not present

## 2012-12-20 DIAGNOSIS — C50312 Malignant neoplasm of lower-inner quadrant of left female breast: Secondary | ICD-10-CM

## 2012-12-20 DIAGNOSIS — Z170 Estrogen receptor positive status [ER+]: Secondary | ICD-10-CM | POA: Diagnosis not present

## 2012-12-20 HISTORY — PX: MASTECTOMY: SHX3

## 2012-12-20 LAB — CBC WITH DIFFERENTIAL/PLATELET
Basophils Absolute: 0 10*3/uL (ref 0.0–0.1)
EOS%: 0.1 % (ref 0.0–7.0)
HCT: 34.8 % (ref 34.8–46.6)
HGB: 11.3 g/dL — ABNORMAL LOW (ref 11.6–15.9)
MCH: 28.4 pg (ref 25.1–34.0)
NEUT%: 73 % (ref 38.4–76.8)
lymph#: 2.4 10*3/uL (ref 0.9–3.3)

## 2012-12-20 LAB — COMPREHENSIVE METABOLIC PANEL (CC13)
BUN: 14.5 mg/dL (ref 7.0–26.0)
CO2: 24 mEq/L (ref 22–29)
Calcium: 9.8 mg/dL (ref 8.4–10.4)
Chloride: 106 mEq/L (ref 98–107)
Creatinine: 0.7 mg/dL (ref 0.6–1.1)
Glucose: 107 mg/dl — ABNORMAL HIGH (ref 70–99)

## 2012-12-20 MED ORDER — SODIUM CHLORIDE 0.9 % IV SOLN
500.0000 mg/m2 | Freq: Once | INTRAVENOUS | Status: AC
  Administered 2012-12-20: 900 mg via INTRAVENOUS
  Filled 2012-12-20: qty 45

## 2012-12-20 MED ORDER — SODIUM CHLORIDE 0.9 % IV SOLN
Freq: Once | INTRAVENOUS | Status: AC
  Administered 2012-12-20: 12:00:00 via INTRAVENOUS

## 2012-12-20 MED ORDER — PALONOSETRON HCL INJECTION 0.25 MG/5ML
0.2500 mg | Freq: Once | INTRAVENOUS | Status: AC
  Administered 2012-12-20: 0.25 mg via INTRAVENOUS

## 2012-12-20 MED ORDER — HEPARIN SOD (PORK) LOCK FLUSH 100 UNIT/ML IV SOLN
500.0000 [IU] | Freq: Once | INTRAVENOUS | Status: AC | PRN
  Administered 2012-12-20: 500 [IU]
  Filled 2012-12-20: qty 5

## 2012-12-20 MED ORDER — DOCETAXEL CHEMO INJECTION 160 MG/16ML
75.0000 mg/m2 | Freq: Once | INTRAVENOUS | Status: AC
  Administered 2012-12-20: 140 mg via INTRAVENOUS
  Filled 2012-12-20: qty 14

## 2012-12-20 MED ORDER — FOSAPREPITANT DIMEGLUMINE INJECTION 150 MG
150.0000 mg | Freq: Once | INTRAVENOUS | Status: AC
  Administered 2012-12-20: 150 mg via INTRAVENOUS
  Filled 2012-12-20: qty 5

## 2012-12-20 MED ORDER — SODIUM CHLORIDE 0.9 % IJ SOLN
10.0000 mL | INTRAMUSCULAR | Status: DC | PRN
  Administered 2012-12-20: 10 mL
  Filled 2012-12-20: qty 10

## 2012-12-20 MED ORDER — DOXORUBICIN HCL CHEMO IV INJECTION 2 MG/ML
50.0000 mg/m2 | Freq: Once | INTRAVENOUS | Status: AC
  Administered 2012-12-20: 90 mg via INTRAVENOUS
  Filled 2012-12-20: qty 45

## 2012-12-20 MED ORDER — DEXAMETHASONE SODIUM PHOSPHATE 4 MG/ML IJ SOLN
12.0000 mg | Freq: Once | INTRAMUSCULAR | Status: AC
  Administered 2012-12-20: 12 mg via INTRAVENOUS

## 2012-12-20 NOTE — Progress Notes (Signed)
HISTORY: Pt is s/p ALND/port a cath after left mastectomy/+ SLN bx.  Post op course complicated by PTX.  She is doing better from breathing standpoint.  She is off all narcotics.  She says that it is finally "getting real" to her since she was at chemotherapy class.  She was very depressed and anxious afterward.  She is considering what would happen if she did not get any chemo.  She was seen in urgent office last week due to green drainage in her axillary drain.  Dr. Ezzard Standing removed it.  She is working on her mobility.      EXAM: General:  Alert and oriented Incision:  Incisions c/d/i.  No reacumulation of seroma.     PATHOLOGY: Lymph nodes, regional resection, Left axillary - METASTATIC CARCINOMA IN 4 OF 15 LYMPH NODES (4/15) WITH EXTRACAPSULAR EXTENSION. - FAT NECROSIS. 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  She was also advised to consider counseling. She was OK'd for driving, other activities. She is OK to attend ABC class.    Follow up in 3 months unless problems arise.        Maudry Diego, MD Surgical Oncology, General & Endocrine Surgery [facility]  Barbette Reichmann, MD Barbette Reichmann, MD

## 2012-12-20 NOTE — Assessment & Plan Note (Signed)
Pt advised to receive chemotherapy.  She was also advised to consider counseling. She was OK'd for driving, other activities. She is OK to attend ABC class.    Follow up in 3 months unless problems arise.

## 2012-12-20 NOTE — Patient Instructions (Addendum)
[facility] Discharge Instructions for Patients Receiving Chemotherapy  Today you received the following chemotherapy agents taxotere,cytoxan,adriamycin  To help prevent nausea and vomiting after your treatment, we encourage you to take your nausea medication as directed.   If you develop nausea and vomiting that is not controlled by your nausea medication, call the clinic. If it is after clinic hours your family physician or the after hours number for the clinic or go to the Emergency Department.   BELOW ARE SYMPTOMS THAT SHOULD BE REPORTED IMMEDIATELY:  *FEVER GREATER THAN 100.5 F  *CHILLS WITH OR WITHOUT FEVER  NAUSEA AND VOMITING THAT IS NOT CONTROLLED WITH YOUR NAUSEA MEDICATION  *UNUSUAL SHORTNESS OF BREATH  *UNUSUAL BRUISING OR BLEEDING  TENDERNESS IN MOUTH AND THROAT WITH OR WITHOUT PRESENCE OF ULCERS  *URINARY PROBLEMS  *BOWEL PROBLEMS  UNUSUAL RASH Items with * indicate a potential emergency and should be followed up as soon as possible.  One of the nurses will contact you 24 hours after your treatment. Please let the nurse know about any problems that you may have experienced. Feel free to call the clinic you have any questions or concerns. The clinic phone number is [phone].   I have been informed and understand all the instructions given to me. I know to contact the clinic, my physician, or go to the Emergency Department if any problems should occur. I do not have any questions at this time, but understand that I may call the clinic during office hours   should I have any questions or need assistance in obtaining follow up care.    __________________________________________  _____________  __________ Signature of Patient or Authorized Representative            Date                   Time    __________________________________________ Nurse's Signature

## 2012-12-21 ENCOUNTER — Ambulatory Visit (HOSPITAL_BASED_OUTPATIENT_CLINIC_OR_DEPARTMENT_OTHER): Payer: Medicare Other

## 2012-12-21 ENCOUNTER — Telehealth: Payer: Self-pay | Admitting: *Deleted

## 2012-12-21 VITALS — BP 148/67 | HR 78 | Temp 98.1°F

## 2012-12-21 DIAGNOSIS — C50319 Malignant neoplasm of lower-inner quadrant of unspecified female breast: Secondary | ICD-10-CM

## 2012-12-21 DIAGNOSIS — C773 Secondary and unspecified malignant neoplasm of axilla and upper limb lymph nodes: Secondary | ICD-10-CM | POA: Diagnosis not present

## 2012-12-21 DIAGNOSIS — C50312 Malignant neoplasm of lower-inner quadrant of left female breast: Secondary | ICD-10-CM

## 2012-12-21 MED ORDER — PEGFILGRASTIM INJECTION 6 MG/0.6ML
6.0000 mg | Freq: Once | SUBCUTANEOUS | Status: AC
  Administered 2012-12-21: 6 mg via SUBCUTANEOUS
  Filled 2012-12-21: qty 0.6

## 2012-12-21 NOTE — Telephone Encounter (Signed)
Cynthia Griffin here for Neulasta injection following 1st tac chemo treatment.  States that she is doing good, only mild nausea which is relieved with the antiemetics.  Drinking and eating well.  All questions answered.  Was told that she could have one glass of wine with her supper meal, but not within 48 hours of chemo, as per Dr Darnelle Catalan.  She knows to call the office if she has any problems, concerns, or questions.

## 2012-12-21 NOTE — Patient Instructions (Addendum)

## 2012-12-22 ENCOUNTER — Other Ambulatory Visit: Payer: Self-pay | Admitting: Oncology

## 2012-12-24 ENCOUNTER — Encounter: Payer: Self-pay | Admitting: Physician Assistant

## 2012-12-27 ENCOUNTER — Telehealth: Payer: Self-pay | Admitting: *Deleted

## 2012-12-27 ENCOUNTER — Ambulatory Visit
Admission: RE | Admit: 2012-12-27 | Discharge: 2012-12-27 | Disposition: A | Discharge status: Home / Self Care | Payer: Medicare Other | Source: Ambulatory Visit | Attending: Oncology | Admitting: Oncology

## 2012-12-27 ENCOUNTER — Ambulatory Visit
Admission: RE | Admit: 2012-12-27 | Discharge: 2012-12-27 | Disposition: A | Discharge status: Home / Self Care | Payer: Medicare Other | Source: Ambulatory Visit | Attending: Family | Admitting: Family

## 2012-12-27 DIAGNOSIS — E559 Vitamin D deficiency, unspecified: Secondary | ICD-10-CM

## 2012-12-27 DIAGNOSIS — Z853 Personal history of malignant neoplasm of breast: Secondary | ICD-10-CM

## 2012-12-27 DIAGNOSIS — C50912 Malignant neoplasm of unspecified site of left female breast: Secondary | ICD-10-CM

## 2012-12-27 DIAGNOSIS — M8580 Other specified disorders of bone density and structure, unspecified site: Secondary | ICD-10-CM

## 2012-12-27 DIAGNOSIS — Z9889 Other specified postprocedural states: Secondary | ICD-10-CM

## 2012-12-27 NOTE — Telephone Encounter (Signed)
Called patient about her heartburn.  The heartburn has not been a continuous problem for her like this current experience.  Has tried tums and digel with no relief.  "Today is the best I've felt so I believe it's easing off."  Informed her she can try any OTC ant-acid (zantac, zergrid, prilosec) and it won't interfere with treatment.  Denies taking the dexamethasone 4mg  bid that is on her medicine list.  Reports joint pain and is taking tylenol for this.  Has vicodin from surgery but does not wish to take this.  Suggested ibuprofen due to it's anti-inflammatory effects.  No further questions or complaints.  Next f/u is tomorrow.

## 2012-12-28 ENCOUNTER — Ambulatory Visit (HOSPITAL_BASED_OUTPATIENT_CLINIC_OR_DEPARTMENT_OTHER): Payer: Medicare Other | Admitting: Physician Assistant

## 2012-12-28 ENCOUNTER — Other Ambulatory Visit (HOSPITAL_BASED_OUTPATIENT_CLINIC_OR_DEPARTMENT_OTHER): Payer: Medicare Other | Admitting: Lab

## 2012-12-28 ENCOUNTER — Encounter: Payer: Self-pay | Admitting: Physician Assistant

## 2012-12-28 VITALS — BP 107/68 | HR 112 | Temp 98.3°F | Resp 20 | Ht 64.07 in | Wt 152.9 lb

## 2012-12-28 DIAGNOSIS — C50319 Malignant neoplasm of lower-inner quadrant of unspecified female breast: Secondary | ICD-10-CM | POA: Diagnosis not present

## 2012-12-28 DIAGNOSIS — K219 Gastro-esophageal reflux disease without esophagitis: Secondary | ICD-10-CM

## 2012-12-28 DIAGNOSIS — C50312 Malignant neoplasm of lower-inner quadrant of left female breast: Secondary | ICD-10-CM

## 2012-12-28 LAB — CBC WITH DIFFERENTIAL/PLATELET
EOS%: 0.4 % (ref 0.0–7.0)
Eosinophils Absolute: 0.1 10*3/uL (ref 0.0–0.5)
MCV: 85.9 fL (ref 79.5–101.0)
MONO%: 11.7 % (ref 0.0–14.0)
NEUT#: 12.1 10*3/uL — ABNORMAL HIGH (ref 1.5–6.5)
RBC: 4.48 10*6/uL (ref 3.70–5.45)
RDW: 13.3 % (ref 11.2–14.5)
lymph#: 3.2 10*3/uL (ref 0.9–3.3)

## 2012-12-28 LAB — COMPREHENSIVE METABOLIC PANEL (CC13)
AST: 11 U/L (ref 5–34)
Albumin: 2.9 g/dL — ABNORMAL LOW (ref 3.5–5.0)
Alkaline Phosphatase: 83 U/L (ref 40–150)
Calcium: 8.9 mg/dL (ref 8.4–10.4)
Chloride: 104 mEq/L (ref 98–107)
Glucose: 130 mg/dl — ABNORMAL HIGH (ref 70–99)
Potassium: 4.1 mEq/L (ref 3.5–5.1)
Sodium: 139 mEq/L (ref 136–145)
Total Protein: 5.8 g/dL — ABNORMAL LOW (ref 6.4–8.3)

## 2012-12-28 MED ORDER — RANITIDINE HCL 150 MG PO TABS: 150.0000 mg | ORAL_TABLET | Freq: Two times a day (BID) | ORAL | Status: DC

## 2012-12-28 NOTE — Progress Notes (Signed)
ID: Cynthia Griffin   DOB: Sep 14, 1945  MR#: [id number]  QIO#:[id number]  PCP: Cynthia Reichmann, MD GYN:  SUAlmond Griffin OTHER MD: Cynthia Griffin, Cynthia Griffin  ALERT: Another patient with the same date of birth, middle initial, and also with a history of breast cancer is in actinic. Please make sure when dealing with Cynthia L. Navejas do have the correct medical record number  HISTORY OF PRESENT ILLNESS: The patient tells me she had an unremarkable screening mammogram January of 2013 in [place]. I do not have that report. Shortly after that, February of 2013, she noted that her left nipple had become inverted. She initially thought this was due to the mammogram itself, but eventually brought it to her primary care physician's attention. This was followed and there was no apparent change when she was next seen December 2013. The patient however became concerned and decided to go back to [facility] for her annual mammography This is where she had her annual mammograms until the last couple of years. Bilateral diagnostic mammography and left ultrasonography there 10/24/2012 showed definite nipple deformity with inversion and retraction. There was subtle architectural distortion and increase in density relative to the preceding study. Left breast ultrasonography in addition to the nipple retraction and inversion showed an area of hyperechoic architectural distortion measuring 1.4 cm in diameter with Doppler flow. Breast specific gamma imaging was obtained February 2024 and confirmed an area of moderate intensity isotope activity following a radial distribution from the left nipple measuring at least 4 cm. 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  Bilateral breast MRI was obtained 10/31/2012 and showed an area of  diffuse non-masslike asymmetric enhancement in the inferior aspect of the left breast measuring up to 7.4 cm transversely. There were no enlarged axillary or internal mammary nodes noted and the right breast was unremarkable. The patient's subsequent history is as detailed below.  INTERVAL HISTORY: Cynthia Griffin returns to the breast clinic today accompanied by her husband Cynthia Griffin. She's currently day 9 cycle 1 of 6 planned q. three-week doses of docetaxel/doxorubicin/cyclophosphamide being given in the adjuvant setting for her left breast carcinoma. She receives Neulasta on day 2 for granulocyte support.  Although Cynthia Griffin tolerated chemotherapy reasonably well, she tells me she has felt "just okay". She did have some constipation for 3 or 4 days following chemotherapy, and fortunately this has now resolved. She developed some symptoms of reflux and heartburn after taking the oral dexamethasone. She's had some taste alteration. Fortunately, she denies any nausea or emesis. She's feel weak and tired. She had some joint pain following the Neulasta injection, primarily affecting her spine in her knees. Her skin is very dry. Her mouth is very dry. She's had some fleeting numbness and tingling in her fingertips alone, but this comes and goes, and is not affecting any of her day-to-day activities thus far.  REVIEW OF SYSTEMS:  Cynthia Griffin denies any fevers or chills. Her nails are beginning to look "yellow" but she denies any additional skin changes other than dryness. She's had no abnormal bruising or bleeding. She denies any cough, phlegm production, pleurisy, or increased shortness of breath, and has had no chest pain or palpitations. She's had no abnormal headaches or dizziness. She's had no peripheral swelling.  A detailed review of systems is otherwise stable and noncontributory.   PAST MEDICAL HISTORY: Past  Medical History  Diagnosis Date  . Breast cancer   . Arthritis   . Anxiety   . Carpal tunnel syndrome of right  wrist   . Family history of anesthesia complication     pneumothorax - post op 1980's    PAST SURGICAL HISTORY: Past Surgical History  Procedure Laterality Date  . Abdominal hysterectomy    . Appendectomy    . Tonsillectomy    . Meniscus repair      Right Knee  . Mastectomy w/ sentinel node biopsy Left 11/14/2012    Procedure: MASTECTOMY WITH SENTINEL LYMPH NODE BIOPSY;  Surgeon: Cynthia Lint, MD;  Location: [facility] OR;  Service: General;  Laterality: Left;  . Axillary lymph node dissection Left 12/01/2012    Procedure: AXILLARY LYMPH NODE DISSECTION;  Surgeon: Cynthia Lint, MD;  Location: [facility];  Service: General;  Laterality: Left;  . Portacath placement Right 12/01/2012    Procedure: INSERTION PORT-A-CATH;  Surgeon: Cynthia Lint, MD;  Location: [facility];  Service: General;  Laterality: Right;   right shoulder rotator cuff repair, right hand carpal tunnel syndrome, bilateral salpingo--oophorectomy with her hysterectomy in the 1990s, Lasix surgery, right toe surgery  FAMILY HISTORY Family History  Problem Relation Age of Onset  . Bladder Cancer Father   . Lung cancer Father    the patient's father died at the age of 57. He was diagnosed with lung cancer the age of 73 and with bladder cancer at the age of 31. He was a smoker. The patient's mother is living at age 55. She has a history of basal cell carcinoma. Darryl had no brothers, one sister. There is no history of breast or ovarian cancer in the immediate family.  GYNECOLOGIC HISTORY: Menarche age 83, first live birth age 74, she is GX P2. She had her hysterectomy she thinks in 1992, and took hormone replacement for approximately 18 years. In addition she took birth control pills between 1968 and 1977 stopping only to conceive.  SOCIAL HISTORY: She is a retired Environmental health practitioner. Her husband Blenda Wisecup (goes by Cynthia Griffin) is in Airline pilot for at [employer] in [place]. Son Donnae Michels lives in [place] and works in Chief Financial Officer. Son R.Maayan Jenning II lives in [place] and works in Airline pilot. The patient has 5 grandchildren" one on the way".   ADVANCED DIRECTIVES: Not in place  HEALTH MAINTENANCE: History  Substance Use Topics  . Smoking status: Former Smoker    Quit date: 09/07/1992  . Smokeless tobacco: Never Used  . Alcohol Use: 4.2 oz/week    7 Glasses of wine per week     Colonoscopy: August 2003  PAP: Status post hysterectomy  Bone density: Never  Lipid panel:  Allergies  Allergen Reactions  . Sulfa Antibiotics Nausea Only and Other (See Comments)    Stomach cramps    Current Outpatient Prescriptions  Medication Sig Dispense Refill  . Biotin 5000 MCG CAPS Take 5,000 mcg by mouth daily.      . calcium citrate-vitamin D (CITRACAL+D) 315-200 MG-UNIT per tablet Take 1 tablet by mouth daily.      . citalopram (CELEXA) 40 MG tablet Take 40 mg by mouth daily.      . cyclobenzaprine (FLEXERIL) 5 MG tablet Take 1 tablet (5 mg total) by mouth 3 (three) times daily as needed for muscle spasms.  30 tablet  0  . dexamethasone (DECADRON) 4 MG tablet Take 1 tablet (4 mg total) by mouth 2 (two) times  daily with a meal.  30 tablet  3  . docusate sodium (COLACE) 100 MG capsule Take 100 mg by mouth 2 (two) times daily as needed for constipation.      . Glucosamine-Chondroit-Vit C-Mn (GLUCOSAMINE CHONDR 1500 COMPLX PO) Take 1 capsule by mouth daily.      Marland Kitchen lidocaine-prilocaine (EMLA) cream Apply topically as needed. Apply over port area 1-2 hours before chemo, cover with plastic wrap  30 g  3  . LORazepam (ATIVAN) 0.5 MG tablet Take 1 tablet (0.5 mg total) by mouth at bedtime as needed for anxiety.  30 tablet  0  . Multiple Vitamin (MULTIVITAMIN WITH MINERALS) TABS Take 1 tablet by mouth daily.      . polyethylene glycol (MIRALAX / GLYCOLAX) packet Take 17 g by mouth daily as needed.      . prochlorperazine (COMPAZINE) 10 MG tablet Take 1 tablet (10 mg total) by  mouth every 6 (six) hours as needed.  30 tablet  3  . tobramycin-dexamethasone (TOBRADEX) ophthalmic solution Place 1 drop into both eyes 2 (two) times daily at 10 AM and 5 PM.  5 mL  0  . HYDROcodone-acetaminophen (NORCO/VICODIN) 5-325 MG per tablet       . prochlorperazine (COMPAZINE) 25 MG suppository Place 1 suppository (25 mg total) rectally every 12 (twelve) hours as needed for nausea.  12 suppository  0  . ranitidine (ZANTAC) 150 MG tablet Take 1 tablet (150 mg total) by mouth 2 (two) times daily.  60 tablet  5   No current facility-administered medications for this visit.    OBJECTIVE: Middle-aged white woman who appears tired but is in no acute distress Filed Vitals:   12/28/12 1636  BP: 107/68  Pulse: 112  Temp: 98.3 F (36.8 C)  Resp: 20     Body mass index is 26.2 kg/(m^2).    ECOG FS: 1 Filed Weights   12/28/12 1636  Weight: 152 lb 14.4 oz (69.355 kg)   Sclerae unicteric Oropharynx clear, no ulcerations or evidence of candidiasis noted No cervical or supraclavicular adenopathy Lungs clear to auscultation bilaterally, no rales or rhonchi Heart regular rate and rhythm Abdomen soft, nontender palpation, positive bowel sounds MSK no focal spinal tenderness,  No peripheral edema Neuro: nonfocal, well oriented, positive affect Breasts: Exam deferred today. Axillae are benign bilaterally for palpable adenopathy.   LAB RESULTS: Lab Results  Component Value Date   WBC 17.4* 12/28/2012   NEUTROABS 12.1* 12/28/2012   HGB 12.4 12/28/2012   HCT 38.5 12/28/2012   MCV 85.9 12/28/2012   PLT 203 12/28/2012      Chemistry      Component Value Date/Time   NA 139 12/28/2012 1345   NA 139 12/03/2012 0155      Component Value Date/Time   CALCIUM 8.9 12/28/2012 1345   CALCIUM 8.3* 12/03/2012 0155       Lab Results  Component Value Date   LABCA2 14 11/02/2012     STUDIES:     12/16/2012 *RADIOLOGY REPORT*  Clinical Data: Pneumothorax.  CHEST - 2 VIEW  Comparison: December 08, 2012.  Findings: Cardiomediastinal silhouette appears normal. Moderate  dextroscoliosis of lower thoracic spine is again noted. Right  subclavian Port-A-Cath is noted with tip in expected position of  the SVC. Left axillary drain has been removed. No pneumothorax is  noted currently. No acute pulmonary disease is noted.  IMPRESSION:  No pneumothorax is seen.  Original Report Authenticated By: Lupita Raider., M.D.  Echocardiogram on 12/19/2012 showed an ejection fraction of 60-65%.     ASSESSMENT: 67 y.o. [place] woman status post left mastectomy and axillary lymph node dissection March 2014 for a pT3 pN2, stage IIIA invasive lobular carcinoma, grade 1, estrogen receptor 100% positive, progesterone receptor and HER-2 negative, with an MIB-1 of 8%.  (1) R pneumothorax with port placement March 2014- resolved  (2)  being treated in the adjuvant setting with docetaxel/doxorubicin/cyclophosphamide, the plan being to complete 6 q. three-week doses, with Neulasta given on day 2 for granulocyte support  (3)  GERD, likely attributed to oral dexamethasone  PLAN: Chanler will return in 2 weeks as scheduled on may 6 in anticipation of her second cycle of adjuvant docetaxel/doxorubicin/cyclophosphamide. I am prescribing ranitidine, 150 mg twice daily, for reflux likely associated with the dexamethasone. I will mention that we had originally discussed prescribing omeprazole, but there is a possible interaction between the omeprazole and her 40 mg dose of Celexa.  I also gave the patient a written bowel regimen which will include stool softeners plus/minus MiraLAX as needed for her regular bowel movements, especially for the first 4 or 5 days following chemotherapy.  All this was reviewed with [facility] and her husband today. They voiced understanding and agreement with our plan, and will call with any changes or problems.    Lamark Schue    12/28/2012

## 2013-01-01 ENCOUNTER — Encounter: Payer: Self-pay | Admitting: Physician Assistant

## 2013-01-02 ENCOUNTER — Other Ambulatory Visit: Payer: Self-pay | Admitting: *Deleted

## 2013-01-02 ENCOUNTER — Encounter: Payer: Medicare Other | Admitting: Internal Medicine

## 2013-01-03 ENCOUNTER — Other Ambulatory Visit: Payer: BC Managed Care – PPO

## 2013-01-10 ENCOUNTER — Encounter: Payer: Self-pay | Admitting: Physician Assistant

## 2013-01-10 ENCOUNTER — Other Ambulatory Visit (HOSPITAL_BASED_OUTPATIENT_CLINIC_OR_DEPARTMENT_OTHER): Payer: Medicare Other | Admitting: Lab

## 2013-01-10 ENCOUNTER — Other Ambulatory Visit: Payer: BC Managed Care – PPO | Admitting: Lab

## 2013-01-10 ENCOUNTER — Ambulatory Visit (HOSPITAL_BASED_OUTPATIENT_CLINIC_OR_DEPARTMENT_OTHER): Payer: Medicare Other

## 2013-01-10 ENCOUNTER — Encounter: Payer: Self-pay | Admitting: Oncology

## 2013-01-10 ENCOUNTER — Ambulatory Visit (HOSPITAL_BASED_OUTPATIENT_CLINIC_OR_DEPARTMENT_OTHER): Payer: Medicare Other | Admitting: Physician Assistant

## 2013-01-10 VITALS — BP 141/75 | HR 95 | Temp 98.8°F | Resp 20 | Ht 64.0 in | Wt 155.8 lb

## 2013-01-10 DIAGNOSIS — K219 Gastro-esophageal reflux disease without esophagitis: Secondary | ICD-10-CM | POA: Diagnosis not present

## 2013-01-10 DIAGNOSIS — C50919 Malignant neoplasm of unspecified site of unspecified female breast: Secondary | ICD-10-CM

## 2013-01-10 DIAGNOSIS — C773 Secondary and unspecified malignant neoplasm of axilla and upper limb lymph nodes: Secondary | ICD-10-CM

## 2013-01-10 DIAGNOSIS — C50911 Malignant neoplasm of unspecified site of right female breast: Secondary | ICD-10-CM

## 2013-01-10 DIAGNOSIS — Z5111 Encounter for antineoplastic chemotherapy: Secondary | ICD-10-CM

## 2013-01-10 DIAGNOSIS — C50312 Malignant neoplasm of lower-inner quadrant of left female breast: Secondary | ICD-10-CM

## 2013-01-10 LAB — COMPREHENSIVE METABOLIC PANEL (CC13)
ALT: 30 U/L (ref 0–55)
AST: 15 U/L (ref 5–34)
Alkaline Phosphatase: 67 U/L (ref 40–150)
Sodium: 141 mEq/L (ref 136–145)
Total Bilirubin: 0.31 mg/dL (ref 0.20–1.20)
Total Protein: 6.2 g/dL — ABNORMAL LOW (ref 6.4–8.3)

## 2013-01-10 LAB — CBC WITH DIFFERENTIAL/PLATELET
BASO%: 0.1 % (ref 0.0–2.0)
EOS%: 0 % (ref 0.0–7.0)
MCH: 28.1 pg (ref 25.1–34.0)
MCV: 88.4 fL (ref 79.5–101.0)
MONO%: 4.1 % (ref 0.0–14.0)
RBC: 3.98 10*6/uL (ref 3.70–5.45)
RDW: 14.5 % (ref 11.2–14.5)
nRBC: 0 % (ref 0–0)

## 2013-01-10 MED ORDER — SODIUM CHLORIDE 0.9 % IV SOLN
75.0000 mg/m2 | Freq: Once | INTRAVENOUS | Status: AC
  Administered 2013-01-10: 140 mg via INTRAVENOUS
  Filled 2013-01-10: qty 14

## 2013-01-10 MED ORDER — HEPARIN SOD (PORK) LOCK FLUSH 100 UNIT/ML IV SOLN
500.0000 [IU] | Freq: Once | INTRAVENOUS | Status: AC | PRN
  Administered 2013-01-10: 500 [IU]
  Filled 2013-01-10: qty 5

## 2013-01-10 MED ORDER — SODIUM CHLORIDE 0.9 % IJ SOLN
10.0000 mL | INTRAMUSCULAR | Status: DC | PRN
  Administered 2013-01-10: 10 mL
  Filled 2013-01-10: qty 10

## 2013-01-10 MED ORDER — SODIUM CHLORIDE 0.9 % IV SOLN
150.0000 mg | Freq: Once | INTRAVENOUS | Status: AC
  Administered 2013-01-10: 150 mg via INTRAVENOUS
  Filled 2013-01-10: qty 5

## 2013-01-10 MED ORDER — SODIUM CHLORIDE 0.9 % IV SOLN
Freq: Once | INTRAVENOUS | Status: AC
  Administered 2013-01-10: 11:00:00 via INTRAVENOUS

## 2013-01-10 MED ORDER — DEXAMETHASONE SODIUM PHOSPHATE 20 MG/5ML IJ SOLN
12.0000 mg | Freq: Once | INTRAMUSCULAR | Status: AC
  Administered 2013-01-10: 12 mg via INTRAVENOUS

## 2013-01-10 MED ORDER — PALONOSETRON HCL INJECTION 0.25 MG/5ML
0.2500 mg | Freq: Once | INTRAVENOUS | Status: AC
  Administered 2013-01-10: 0.25 mg via INTRAVENOUS

## 2013-01-10 MED ORDER — SODIUM CHLORIDE 0.9 % IV SOLN
500.0000 mg/m2 | Freq: Once | INTRAVENOUS | Status: AC
  Administered 2013-01-10: 900 mg via INTRAVENOUS
  Filled 2013-01-10: qty 45

## 2013-01-10 MED ORDER — DOXORUBICIN HCL CHEMO IV INJECTION 2 MG/ML
50.0000 mg/m2 | Freq: Once | INTRAVENOUS | Status: AC
  Administered 2013-01-10: 90 mg via INTRAVENOUS
  Filled 2013-01-10: qty 45

## 2013-01-10 NOTE — Patient Instructions (Signed)
[facility] Discharge Instructions for Patients Receiving Chemotherapy  Today you received the following chemotherapy agents Adriamycin/Taxotere/Cytoxan To help prevent nausea and vomiting after your treatment, we encourage you to take your nausea medication as prescribed.   If you develop nausea and vomiting that is not controlled by your nausea medication, call the clinic. If it is after clinic hours your family physician or the after hours number for the clinic or go to the Emergency Department.   BELOW ARE SYMPTOMS THAT SHOULD BE REPORTED IMMEDIATELY:  *FEVER GREATER THAN 100.5 F  *CHILLS WITH OR WITHOUT FEVER  NAUSEA AND VOMITING THAT IS NOT CONTROLLED WITH YOUR NAUSEA MEDICATION  *UNUSUAL SHORTNESS OF BREATH  *UNUSUAL BRUISING OR BLEEDING  TENDERNESS IN MOUTH AND THROAT WITH OR WITHOUT PRESENCE OF ULCERS  *URINARY PROBLEMS  *BOWEL PROBLEMS  UNUSUAL RASH Items with * indicate a potential emergency and should be followed up as soon as possible.  One of the nurses will contact you 24 hours after your treatment. Please let the nurse know about any problems that you may have experienced. Feel free to call the clinic you have any questions or concerns. The clinic phone number is [phone].   I have been informed and understand all the instructions given to me. I know to contact the clinic, my physician, or go to the Emergency Department if any problems should occur. I do not have any questions at this time, but understand that I may call the clinic during office hours   should I have any questions or need assistance in obtaining follow up care.    __________________________________________  _____________  __________ Signature of Patient or Authorized Representative            Date                   Time    __________________________________________ Nurse's Signature

## 2013-01-10 NOTE — Progress Notes (Signed)
ID: Cynthia Griffin   DOB: October 20, 1945  MR#: [id number]  WUJ#:[id number]  PCP: Barbette Reichmann, MD GYN:  SUAlmond Lint OTHER MD: Dorothy Puffer, Jeralyn Ruths  ALERT: Another patient with the same date of birth, middle initial, and also with a history of breast cancer is in actinic. Please make sure when dealing with Trace L. Obi that you have the correct medical record number  HISTORY OF PRESENT ILLNESS: The patient tells me she had an unremarkable screening mammogram January of 2013 in [place]. I do not have that report. Shortly after that, February of 2013, she noted that her left nipple had become inverted. She initially thought this was due to the mammogram itself, but eventually brought it to her primary care physician's attention. This was followed and there was no apparent change when she was next seen December 2013. The patient however became concerned and decided to go back to [facility] for her annual mammography This is where she had her annual mammograms until the last couple of years. Bilateral diagnostic mammography and left ultrasonography there 10/24/2012 showed definite nipple deformity with inversion and retraction. There was subtle architectural distortion and increase in density relative to the preceding study. Left breast ultrasonography in addition to the nipple retraction and inversion showed an area of hyperechoic architectural distortion measuring 1.4 cm in diameter with Doppler flow. Breast specific gamma imaging was obtained February 2024 and confirmed an area of moderate intensity isotope activity following a radial distribution from the left nipple measuring at least 4 cm. 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  Bilateral breast MRI was obtained 10/31/2012 and showed an area  of diffuse non-masslike asymmetric enhancement in the inferior aspect of the left breast measuring up to 7.4 cm transversely. There were no enlarged axillary or internal mammary nodes noted and the right breast was unremarkable. The patient's subsequent history is as detailed below.  INTERVAL HISTORY: Cynthia Griffin returns  today for followup of her left breast carcinoma. She is due for day 1 cycle 2 of 6 planned q. three-week doses of docetaxel/doxorubicin/cyclophosphamide being given in the adjuvant setting and. She receives Neulasta on day 2 for granulocyte support.  Cynthia Griffin is feeling well today and tells me the past week has been "normal". She is slightly more fatigued than usual, but is able to perform all of her day-to-day activities. She has some fleeting tingling in her fingertips, but this is intermittent and is not affecting her fine motor skills. She's had no signs of neuropathy in the lower extremities.  Cynthia Griffin started on ranitidine 2 weeks ago, and has had a significant improvement in her reflux. She's had no nausea or emesis.  REVIEW OF SYSTEMS:  Cynthia Griffin denies any fevers or chills.  She's had no abnormal bruising or bleeding. She's had no change in bowel or bladder habits. She denies any cough, phlegm production, pleurisy, or increased shortness of breath, and has had no chest pain or palpitations. She's had no abnormal headaches or dizziness. She's had no peripheral swelling.  A detailed review of systems is otherwise stable and noncontributory.   PAST MEDICAL HISTORY: Past Medical History  Diagnosis Date  . Breast cancer   . Arthritis   . Anxiety   . Carpal tunnel syndrome of right wrist   . Family history of anesthesia complication     pneumothorax -  post op 1980's    PAST SURGICAL HISTORY: Past Surgical History  Procedure Laterality Date  . Abdominal hysterectomy    . Appendectomy    . Tonsillectomy    . Meniscus repair      Right Knee  . Mastectomy w/ sentinel node biopsy Left  11/14/2012    Procedure: MASTECTOMY WITH SENTINEL LYMPH NODE BIOPSY;  Surgeon: Almond Lint, MD;  Location: [facility];  Service: General;  Laterality: Left;  . Axillary lymph node dissection Left 12/01/2012    Procedure: AXILLARY LYMPH NODE DISSECTION;  Surgeon: Almond Lint, MD;  Location: [facility];  Service: General;  Laterality: Left;  . Portacath placement Right 12/01/2012    Procedure: INSERTION PORT-A-CATH;  Surgeon: Almond Lint, MD;  Location: [facility];  Service: General;  Laterality: Right;   right shoulder rotator cuff repair, right hand carpal tunnel syndrome, bilateral salpingo--oophorectomy with her hysterectomy in the 1990s, Lasix surgery, right toe surgery  FAMILY HISTORY Family History  Problem Relation Age of Onset  . Bladder Cancer Father   . Lung cancer Father    the patient's father died at the age of 3. He was diagnosed with lung cancer the age of 88 and with bladder cancer at the age of 7. He was a smoker. The patient's mother is living at age 17. She has a history of basal cell carcinoma. Cynthia Griffin had no brothers, one sister. There is no history of breast or ovarian cancer in the immediate family.  GYNECOLOGIC HISTORY: Menarche age 62, first live birth age 59, she is GX P2. She had her hysterectomy she thinks in 1992, and took hormone replacement for approximately 18 years. In addition she took birth control pills between 1968 and 1977 stopping only to conceive.  SOCIAL HISTORY: She is a retired Environmental health practitioner. Her husband Cynthia Griffin (goes by Fredrik Cove) is in Airline pilot for at [employer] in [place]. Son Cynthia Griffin lives in [place] and works in Chief Financial Officer. Son Cynthia [place] lives in [place] and works in Airline pilot. The patient has 5 grandchildren" one on the way".   ADVANCED DIRECTIVES: Not in place  HEALTH MAINTENANCE: History  Substance Use Topics  . Smoking status: Former Smoker    Quit date:  09/07/1992  . Smokeless tobacco: Never Used  . Alcohol Use: 4.2 oz/week    7 Glasses of wine per week     Colonoscopy: August 2003  PAP: Status post hysterectomy  Bone density: Never  Lipid panel:  Allergies  Allergen Reactions  . Sulfa Antibiotics Nausea Only and Other (See Comments)    Stomach cramps    Current Outpatient Prescriptions  Medication Sig Dispense Refill  . Biotin 5000 MCG CAPS Take 5,000 mcg by mouth daily.      . calcium citrate-vitamin D (CITRACAL+D) 315-200 MG-UNIT per tablet Take 1 tablet by mouth daily.      . citalopram (CELEXA) 40 MG tablet Take 40 mg by mouth daily.      . cyclobenzaprine (FLEXERIL) 5 MG tablet Take 1 tablet (5 mg total) by mouth 3 (three) times daily as needed for muscle spasms.  30 tablet  0  . dexamethasone (DECADRON) 4 MG tablet Take 1 tablet (4 mg total) by mouth 2 (two) times daily with a meal.  30 tablet  3  . docusate sodium (COLACE) 100 MG capsule Take 100 mg by mouth 2 (two) times daily as needed for constipation.      . Glucosamine-Chondroit-Vit C-Mn (GLUCOSAMINE CHONDR  1500 COMPLX PO) Take 1 capsule by mouth daily.      Marland Kitchen HYDROcodone-acetaminophen (NORCO/VICODIN) 5-325 MG per tablet       . lidocaine-prilocaine (EMLA) cream Apply topically as needed. Apply over port area 1-2 hours before chemo, cover with plastic wrap  30 g  3  . LORazepam (ATIVAN) 0.5 MG tablet Take 1 tablet (0.5 mg total) by mouth at bedtime as needed for anxiety.  30 tablet  0  . Multiple Vitamin (MULTIVITAMIN WITH MINERALS) TABS Take 1 tablet by mouth daily.      . polyethylene glycol (MIRALAX / GLYCOLAX) packet Take 17 g by mouth daily as needed.      . prochlorperazine (COMPAZINE) 10 MG tablet Take 1 tablet (10 mg total) by mouth every 6 (six) hours as needed.  30 tablet  3  . prochlorperazine (COMPAZINE) 25 MG suppository Place 1 suppository (25 mg total) rectally every 12 (twelve) hours as needed for nausea.  12 suppository  0  . ranitidine (ZANTAC) 150 MG  tablet Take 1 tablet (150 mg total) by mouth 2 (two) times daily.  60 tablet  5  . tobramycin-dexamethasone (TOBRADEX) ophthalmic solution Place 1 drop into both eyes 2 (two) times daily at 10 AM and 5 PM.  5 mL  0   No current facility-administered medications for this visit.   Facility-Administered Medications Ordered in Other Visits  Medication Dose Route Frequency Provider Last Rate Last Dose  . cyclophosphamide (CYTOXAN) 900 mg in sodium chloride 0.9 % 250 mL chemo infusion  500 mg/m2 (Treatment Plan Actual) Intravenous Once Liliauna Santoni Allegra Grana, PA-C      . Dexamethasone Sodium Phosphate (DECADRON) injection 12 mg  12 mg Intravenous Once Edras Wilford G Yeilin Zweber, PA-C      . DOCEtaxel (TAXOTERE) 140 mg in sodium chloride 0.9 % 250 mL chemo infusion  75 mg/m2 (Treatment Plan Actual) Intravenous Once Hisayo Delossantos G Pope Brunty, PA-C      . DOXOrubicin (ADRIAMYCIN) chemo injection 90 mg  50 mg/m2 (Treatment Plan Actual) Intravenous Once Shlomo Seres G Alvie Speltz, PA-C      . fosaprepitant (EMEND) 150 mg in sodium chloride 0.9 % 145 mL IVPB  150 mg Intravenous Once Fusae Florio G Suede Greenawalt, PA-C      . heparin lock flush 100 unit/mL  500 Units Intracatheter Once PRN Josiyah Tozzi Allegra Grana, PA-C      . sodium chloride 0.9 % injection 10 mL  10 mL Intracatheter PRN Kathrine Rieves Allegra Grana, PA-C        OBJECTIVE: Middle-aged white woman who appears comfortable and is in no acute distress Filed Vitals:   01/10/13 0940  BP: 141/75  Pulse: 95  Temp: 98.8 F (37.1 C)  Resp: 20     Body mass index is 26.73 kg/(m^2).    ECOG FS: 1 Filed Weights   01/10/13 0940  Weight: 155 lb 12.8 oz (70.67 kg)   Sclerae unicteric Oropharynx clear, no ulcerations or evidence of candidiasis noted No cervical or supraclavicular adenopathy Lungs clear to auscultation bilaterally, no wheezes or rhonchi Heart regular rate and rhythm Abdomen soft, nontender to palpation, positive bowel sounds MSK no focal spinal tenderness,  No peripheral edema Neuro: nonfocal, well oriented, positive  affect Breasts: Exam deferred today. Axillae are benign bilaterally with no palpable adenopathy.   LAB RESULTS: Lab Results  Component Value Date   WBC 19.9* 01/10/2013   NEUTROABS 17.2* 01/10/2013   HGB 11.2* 01/10/2013   HCT 35.2 01/10/2013   MCV 88.4 01/10/2013   PLT 441*  01/10/2013      Chemistry      Component Value Date/Time   NA 139 12/28/2012 1345   NA 139 12/03/2012 0155      Component Value Date/Time   CALCIUM 8.9 12/28/2012 1345   CALCIUM 8.3* 12/03/2012 0155       Lab Results  Component Value Date   LABCA2 14 11/02/2012     STUDIES:     12/16/2012 *RADIOLOGY REPORT*  Clinical Data: Pneumothorax.  CHEST - 2 VIEW  Comparison: December 08, 2012.  Findings: Cardiomediastinal silhouette appears normal. Moderate  dextroscoliosis of lower thoracic spine is again noted. Right  subclavian Port-A-Cath is noted with tip in expected position of  the SVC. Left axillary drain has been removed. No pneumothorax is  noted currently. No acute pulmonary disease is noted.  IMPRESSION:  No pneumothorax is seen.  Original Report Authenticated By: Lupita Raider., M.D.     Echocardiogram on 12/19/2012 showed an ejection fraction of 60-65%.     ASSESSMENT: 67 y.o. [place] woman status post left mastectomy and axillary lymph node dissection March 2014 for a pT3 pN2, stage IIIA invasive lobular carcinoma, grade 1, estrogen receptor 100% positive, progesterone receptor and HER-2 negative, with an MIB-1 of 8%.  (1) R pneumothorax with port placement March 2014- resolved  (2)  being treated in the adjuvant setting with docetaxel/doxorubicin/cyclophosphamide, the plan being to complete 6 q. three-week doses, with Neulasta given on day 2 for granulocyte support  (3)  GERD, likely attributed to oral dexamethasone, improved with ranitidine  PLAN:  Cynthia Griffin will proceed to treatment today as scheduled for day 1 cycle 2 of docetaxel/doxorubicin/cyclophosphamide.  She'll return tomorrow for her  Neulasta injection, and will see her next week on May 13 for followup and assessment of chemotoxicity.  Cynthia Griffin voices understanding and agreement with this plan, and will call with any changes or problems prior to her next appointment.    Cynthia Griffin    01/10/2013

## 2013-01-11 ENCOUNTER — Ambulatory Visit (HOSPITAL_BASED_OUTPATIENT_CLINIC_OR_DEPARTMENT_OTHER): Payer: Medicare Other

## 2013-01-11 VITALS — BP 126/62 | HR 75 | Temp 98.4°F

## 2013-01-11 DIAGNOSIS — C50919 Malignant neoplasm of unspecified site of unspecified female breast: Secondary | ICD-10-CM | POA: Diagnosis not present

## 2013-01-11 DIAGNOSIS — C50312 Malignant neoplasm of lower-inner quadrant of left female breast: Secondary | ICD-10-CM

## 2013-01-11 MED ORDER — PEGFILGRASTIM INJECTION 6 MG/0.6ML
6.0000 mg | Freq: Once | SUBCUTANEOUS | Status: AC
  Administered 2013-01-11: 6 mg via SUBCUTANEOUS
  Filled 2013-01-11: qty 0.6

## 2013-01-17 ENCOUNTER — Ambulatory Visit (HOSPITAL_BASED_OUTPATIENT_CLINIC_OR_DEPARTMENT_OTHER): Payer: Medicare Other | Admitting: Physician Assistant

## 2013-01-17 ENCOUNTER — Encounter: Payer: Self-pay | Admitting: Physician Assistant

## 2013-01-17 ENCOUNTER — Other Ambulatory Visit (HOSPITAL_BASED_OUTPATIENT_CLINIC_OR_DEPARTMENT_OTHER): Payer: Medicare Other | Admitting: Lab

## 2013-01-17 ENCOUNTER — Telehealth: Payer: Self-pay | Admitting: *Deleted

## 2013-01-17 VITALS — BP 124/71 | HR 101 | Temp 98.5°F | Resp 20 | Ht 64.0 in | Wt 155.5 lb

## 2013-01-17 DIAGNOSIS — C50319 Malignant neoplasm of lower-inner quadrant of unspecified female breast: Secondary | ICD-10-CM | POA: Diagnosis not present

## 2013-01-17 DIAGNOSIS — C50312 Malignant neoplasm of lower-inner quadrant of left female breast: Secondary | ICD-10-CM

## 2013-01-17 DIAGNOSIS — K5900 Constipation, unspecified: Secondary | ICD-10-CM

## 2013-01-17 LAB — CBC WITH DIFFERENTIAL/PLATELET
Basophils Absolute: 0 10*3/uL (ref 0.0–0.1)
Eosinophils Absolute: 0.1 10*3/uL (ref 0.0–0.5)
HCT: 34.7 % — ABNORMAL LOW (ref 34.8–46.6)
HGB: 11.3 g/dL — ABNORMAL LOW (ref 11.6–15.9)
LYMPH%: 49.3 % (ref 14.0–49.7)
MONO#: 1.6 10*3/uL — ABNORMAL HIGH (ref 0.1–0.9)
NEUT%: 32.9 % — ABNORMAL LOW (ref 38.4–76.8)
Platelets: 256 10*3/uL (ref 145–400)
WBC: 9.8 10*3/uL (ref 3.9–10.3)
lymph#: 4.8 10*3/uL — ABNORMAL HIGH (ref 0.9–3.3)

## 2013-01-17 LAB — COMPREHENSIVE METABOLIC PANEL (CC13)
ALT: 27 U/L (ref 0–55)
Alkaline Phosphatase: 86 U/L (ref 40–150)
Chloride: 100 mEq/L (ref 98–107)
Creatinine: 0.9 mg/dL (ref 0.6–1.1)
Potassium: 3.5 mEq/L (ref 3.5–5.1)
Total Bilirubin: 0.3 mg/dL (ref 0.20–1.20)
Total Protein: 6.2 g/dL — ABNORMAL LOW (ref 6.4–8.3)

## 2013-01-17 NOTE — Telephone Encounter (Signed)
sw pt husband informing him about the pts appts for 6/17 and 7/8. He stated that they will get a print out when they come back into the office on 02/01/13...td

## 2013-01-17 NOTE — Progress Notes (Signed)
ID: Cynthia Griffin   DOB: [birth year]  MR#: [id number]  WUJ#:[id number]  PCP: Barbette Reichmann, MD GYN:  SUAlmond Griffin OTHER MD: Dorothy Puffer, Jeralyn Ruths  ALERT: Another patient with the same date of birth, middle initial, and also with a history of breast cancer is in actinic. Please make sure when dealing with Cynthia Griffin that you have the correct medical record number  HISTORY OF PRESENT ILLNESS: The patient tells me she had an unremarkable screening mammogram January of 2013 in [facility]. I do not have that report. Shortly after that, February of 2013, she noted that her left nipple had become inverted. She initially thought this was due to the mammogram itself, but eventually brought it to her primary care physician's attention. This was followed and there was no apparent change when she was next seen December 2013. The patient however became concerned and decided to go back to [facility] for her annual mammography This is where she had her annual mammograms until the last couple of years. Bilateral diagnostic mammography and left ultrasonography there 10/24/2012 showed definite nipple deformity with inversion and retraction. There was subtle architectural distortion and increase in density relative to the preceding study. Left breast ultrasonography in addition to the nipple retraction and inversion showed an area of hyperechoic architectural distortion measuring 1.4 cm in diameter with Doppler flow. Breast specific gamma imaging was obtained February 2024 and confirmed an area of moderate intensity isotope activity following a radial distribution from the left nipple measuring at least 4 cm. 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  Bilateral breast MRI was obtained 10/31/2012 and showed an area  of diffuse non-masslike asymmetric enhancement in the inferior aspect of the left breast measuring up to 7.4 cm transversely. There were no enlarged axillary or internal mammary nodes noted and the right breast was unremarkable. The patient's subsequent history is as detailed below.  INTERVAL HISTORY: Cynthia Griffin returns  today for followup of her left breast carcinoma. She is currently day 8 cycle 2 of 6 planned q. three-week doses of docetaxel/doxorubicin/cyclophosphamide being given in the adjuvant setting and. She receives Neulasta on day 2 for granulocyte support.  Cynthia Griffin is feeling well today and tells me she has "no complaints" other than mild weakness. She's keeping herself well hydrated. She has had some mild constipation which she is managing with stool softeners. Her reflux did improve on ranitidine.  Cynthia Griffin is getting ready to travel to [place] over the [place] Day holiday to visit her son and grandchildren, and to attend the [place] [employer]. She is very excited about this trip.   REVIEW OF SYSTEMS:  Cynthia Griffin denies any fevers or chills.  She's had no rashes, skin changes, abnormal bruising or bleeding. She's had no nausea or emesis and denies any change in bowel or bladder habits. She denies any cough, phlegm production, pleurisy, or increased shortness of breath, and has had no chest pain or palpitations. She's had no abnormal headaches or dizziness. She's had no peripheral swelling. She has only some slight intermittent tingling limited only to her right fingertips. She's had no additional signs of peripheral neuropathy. She has some mild bony pain this past week which is now resolved, and she denies any pain elsewhere.  A detailed review of systems is otherwise stable and noncontributory.   PAST MEDICAL HISTORY: Past Medical History  Diagnosis Date  . Breast cancer   . Arthritis   . Anxiety   . Carpal tunnel syndrome of right wrist   . Family history of anesthesia complication      pneumothorax - post op 1980's    PAST SURGICAL HISTORY: Past Surgical History  Procedure Laterality Date  . Abdominal hysterectomy    . Appendectomy    . Tonsillectomy    . Meniscus repair      Right Knee  . Mastectomy w/ sentinel node biopsy Left 11/14/2012    Procedure: MASTECTOMY WITH SENTINEL LYMPH NODE BIOPSY;  Surgeon: Cynthia Lint, MD;  Location: [facility];  Service: General;  Laterality: Left;  . Axillary lymph node dissection Left 12/01/2012    Procedure: AXILLARY LYMPH NODE DISSECTION;  Surgeon: Cynthia Lint, MD;  Location: [facility];  Service: General;  Laterality: Left;  . Portacath placement Right 12/01/2012    Procedure: INSERTION PORT-A-CATH;  Surgeon: Cynthia Lint, MD;  Location: [facility];  Service: General;  Laterality: Right;   right shoulder rotator cuff repair, right hand carpal tunnel syndrome, bilateral salpingo--oophorectomy with her hysterectomy in the 1990s, Lasix surgery, right toe surgery  FAMILY HISTORY Family History  Problem Relation Age of Onset  . Bladder Cancer Father   . Lung cancer Father    the patient's father died at the age of 88. He was diagnosed with lung cancer the age of 28 and with bladder cancer at the age of 20. He was a smoker. The patient's mother is living at age 69. She has a history of basal cell carcinoma. Cynthia Griffin had no brothers, one sister. There is no history of breast or ovarian cancer in the immediate family.  GYNECOLOGIC HISTORY: Menarche age 36, first live birth age 9, she is GX P2. She had her hysterectomy she thinks in 1992, and took hormone replacement for approximately 18 years. In addition she took birth control pills between 1968 and 1977 stopping only to conceive.  SOCIAL HISTORY: She is a retired Environmental health practitioner. Her husband Cynthia Griffin (goes by Cynthia [place]) is in Airline pilot for at [employer] in [place]. Son Cynthia Griffin lives in [place] and works in  Chief Financial Officer. Son Cynthia Griffin lives in [place] and works in Airline pilot. The patient has 5 grandchildren" one on the way".   ADVANCED DIRECTIVES: Not in place  HEALTH MAINTENANCE: History  Substance Use Topics  . Smoking status: Former Smoker    Quit date: 09/07/1992  . Smokeless tobacco: Never Used  . Alcohol Use: 4.2 oz/week    7 Glasses of wine per week     Colonoscopy: August 2003  PAP: Status post hysterectomy  Bone density: Never  Lipid panel:  Allergies  Allergen Reactions  . Sulfa Antibiotics Nausea Only and Other (See Comments)    Stomach cramps    Current Outpatient Prescriptions  Medication Sig Dispense Refill  . Biotin 5000 MCG CAPS Take 5,000 mcg by mouth daily.      . calcium citrate-vitamin D (CITRACAL+D) 315-200 MG-UNIT per tablet Take 1 tablet by mouth daily.      . citalopram (CELEXA) 40 MG tablet Take 40 mg by mouth daily.      . cyclobenzaprine (FLEXERIL) 5 MG tablet Take 1 tablet (5 mg total) by mouth 3 (three) times daily as needed for muscle spasms.  30 tablet  0  . dexamethasone (DECADRON) 4 MG tablet Take 1 tablet (4 mg total) by mouth 2 (two) times daily with a  meal.  30 tablet  3  . docusate sodium (COLACE) 100 MG capsule Take 100 mg by mouth 2 (two) times daily as needed for constipation.      . Glucosamine-Chondroit-Vit C-Mn (GLUCOSAMINE CHONDR 1500 COMPLX PO) Take 1 capsule by mouth daily.      Marland Kitchen lidocaine-prilocaine (EMLA) cream Apply topically as needed. Apply over port area 1-2 hours before chemo, cover with plastic wrap  30 g  3  . LORazepam (ATIVAN) 0.5 MG tablet Take 1 tablet (0.5 mg total) by mouth at bedtime as needed for anxiety.  30 tablet  0  . Multiple Vitamin (MULTIVITAMIN WITH MINERALS) TABS Take 1 tablet by mouth daily.      . polyethylene glycol (MIRALAX / GLYCOLAX) packet Take 17 g by mouth daily as needed.      . prochlorperazine (COMPAZINE) 10 MG tablet Take 1 tablet (10 mg total) by mouth every 6 (six) hours as needed.  30 tablet  3   . ranitidine (ZANTAC) 150 MG tablet Take 1 tablet (150 mg total) by mouth 2 (two) times daily.  60 tablet  5  . tobramycin-dexamethasone (TOBRADEX) ophthalmic solution Place 1 drop into both eyes 2 (two) times daily at 10 AM and 5 PM.  5 mL  0  . HYDROcodone-acetaminophen (NORCO/VICODIN) 5-325 MG per tablet       . prochlorperazine (COMPAZINE) 25 MG suppository Place 1 suppository (25 mg total) rectally every 12 (twelve) hours as needed for nausea.  12 suppository  0   No current facility-administered medications for this visit.    OBJECTIVE: Middle-aged white woman who appears comfortable and is in no acute distress Filed Vitals:   01/17/13 1309  BP: 124/71  Pulse: 101  Temp: 98.5 F (36.9 C)  Resp: 20     Body mass index is 26.68 kg/(m^2).    ECOG FS: 1 Filed Weights   01/17/13 1309  Weight: 155 lb 8 oz (70.534 kg)   Sclerae unicteric Oropharynx clear, no ulcerations No cervical or supraclavicular adenopathy Lungs clear to auscultation bilaterally, no crackles, wheezes or rhonchi Heart regular rate and rhythm Abdomen soft, nontender to palpation, positive bowel sounds MSK no focal spinal tenderness to palpation,  No peripheral edema Neuro: nonfocal, well oriented, positive affect Breasts: Exam deferred today. Axillae are benign bilaterally with no palpable adenopathy.   LAB RESULTS: Lab Results  Component Value Date   WBC 9.8 01/17/2013   NEUTROABS 3.2 01/17/2013   HGB 11.3* 01/17/2013   HCT 34.7* 01/17/2013   MCV 87.8 01/17/2013   PLT 256 01/17/2013    CMP     Component Value Date/Time   NA 139 01/17/2013 1253   NA 139 12/03/2012 0155   K 3.5 01/17/2013 1253   K 3.8 12/03/2012 0155   CL 100 01/17/2013 1253   CL 104 12/03/2012 0155   CO2 29 01/17/2013 1253   CO2 28 12/03/2012 0155   GLUCOSE 117* 01/17/2013 1253   GLUCOSE 114* 12/03/2012 0155   BUN 16.5 01/17/2013 1253   BUN 10 12/03/2012 0155   CREATININE 0.9 01/17/2013 1253   CREATININE 0.69 12/03/2012 0155   CREATININE  0.68 12/03/2012 0155   CALCIUM 9.0 01/17/2013 1253   CALCIUM 8.3* 12/03/2012 0155   PROT 6.2* 01/17/2013 1253   ALBUMIN 3.4* 01/17/2013 1253   AST 11 01/17/2013 1253   ALT 27 01/17/2013 1253   ALKPHOS 86 01/17/2013 1253   BILITOT 0.30 01/17/2013 1253   GFRNONAA 89* 12/03/2012 0155   GFRNONAA 90* 12/03/2012  0155   GFRAA >90 12/03/2012 0155   GFRAA >90 12/03/2012 0155      STUDIES:      Echocardiogram on 12/19/2012 showed an ejection fraction of 60-65%.     ASSESSMENT: 67 y.o. [place] woman status post left mastectomy and axillary lymph node dissection March 2014 for a pT3 pN2, stage IIIA invasive lobular carcinoma, grade 1, estrogen receptor 100% positive, progesterone receptor and HER-2 negative, with an MIB-1 of 8%.  (1) R pneumothorax with port placement March 2014- resolved  (2)  being treated in the adjuvant setting with docetaxel/doxorubicin/cyclophosphamide, the plan being to complete 6 q. three-week doses, with Neulasta given on day 2 for granulocyte support  (3)  GERD, likely attributed to oral dexamethasone, improved with ranitidine  PLAN:  Sudie continues to tolerate treatment extremely well. She'll return in 2 weeks on may 28th for repeat labs and physical exam in anticipation of day 1 cycle 3 of chemotherapy. She will call meanwhile any changes or problems.    Anneta Rounds    01/17/2013

## 2013-01-19 ENCOUNTER — Telehealth: Payer: Self-pay | Admitting: *Deleted

## 2013-01-19 NOTE — Telephone Encounter (Signed)
Spoke to pt concerning treatment.  Pt relay 2nd treatment was better and that she felt a little tired.  Encourage pt to call with needs.  Gave emotional support and contact information.

## 2013-01-19 NOTE — Telephone Encounter (Signed)
Per staff message and POF I have scheduled appts.  [name]  

## 2013-01-23 ENCOUNTER — Other Ambulatory Visit: Payer: Self-pay | Admitting: Internal Medicine

## 2013-01-31 ENCOUNTER — Other Ambulatory Visit: Payer: BC Managed Care – PPO | Admitting: Lab

## 2013-01-31 ENCOUNTER — Ambulatory Visit: Payer: Medicare Other | Admitting: Physician Assistant

## 2013-02-01 ENCOUNTER — Other Ambulatory Visit (HOSPITAL_BASED_OUTPATIENT_CLINIC_OR_DEPARTMENT_OTHER): Payer: Medicare Other | Admitting: Lab

## 2013-02-01 ENCOUNTER — Other Ambulatory Visit (HOSPITAL_COMMUNITY): Payer: Self-pay | Admitting: Oncology

## 2013-02-01 ENCOUNTER — Encounter: Payer: Self-pay | Admitting: Physician Assistant

## 2013-02-01 ENCOUNTER — Ambulatory Visit (HOSPITAL_BASED_OUTPATIENT_CLINIC_OR_DEPARTMENT_OTHER): Payer: Medicare Other | Admitting: Physician Assistant

## 2013-02-01 ENCOUNTER — Ambulatory Visit: Payer: Medicare Other

## 2013-02-01 ENCOUNTER — Ambulatory Visit (HOSPITAL_BASED_OUTPATIENT_CLINIC_OR_DEPARTMENT_OTHER): Payer: Medicare Other

## 2013-02-01 ENCOUNTER — Other Ambulatory Visit: Payer: Self-pay | Admitting: Oncology

## 2013-02-01 VITALS — BP 127/69 | HR 103 | Temp 98.7°F | Resp 20 | Ht 64.0 in | Wt 160.7 lb

## 2013-02-01 DIAGNOSIS — C50312 Malignant neoplasm of lower-inner quadrant of left female breast: Secondary | ICD-10-CM

## 2013-02-01 DIAGNOSIS — Z5111 Encounter for antineoplastic chemotherapy: Secondary | ICD-10-CM | POA: Diagnosis not present

## 2013-02-01 DIAGNOSIS — C773 Secondary and unspecified malignant neoplasm of axilla and upper limb lymph nodes: Secondary | ICD-10-CM

## 2013-02-01 DIAGNOSIS — K219 Gastro-esophageal reflux disease without esophagitis: Secondary | ICD-10-CM | POA: Diagnosis not present

## 2013-02-01 DIAGNOSIS — C50319 Malignant neoplasm of lower-inner quadrant of unspecified female breast: Secondary | ICD-10-CM

## 2013-02-01 DIAGNOSIS — C50919 Malignant neoplasm of unspecified site of unspecified female breast: Secondary | ICD-10-CM

## 2013-02-01 DIAGNOSIS — D6481 Anemia due to antineoplastic chemotherapy: Secondary | ICD-10-CM | POA: Insufficient documentation

## 2013-02-01 LAB — COMPREHENSIVE METABOLIC PANEL (CC13)
ALT: 71 U/L — ABNORMAL HIGH (ref 0–55)
BUN: 14.4 mg/dL (ref 7.0–26.0)
CO2: 22 mEq/L (ref 22–29)
Calcium: 8.6 mg/dL (ref 8.4–10.4)
Chloride: 109 mEq/L — ABNORMAL HIGH (ref 98–107)
Creatinine: 0.7 mg/dL (ref 0.6–1.1)
Total Bilirubin: 0.33 mg/dL (ref 0.20–1.20)

## 2013-02-01 LAB — CBC WITH DIFFERENTIAL/PLATELET
Eosinophils Absolute: 0 10*3/uL (ref 0.0–0.5)
HCT: 33.3 % — ABNORMAL LOW (ref 34.8–46.6)
LYMPH%: 12.4 % — ABNORMAL LOW (ref 14.0–49.7)
MCHC: 31.8 g/dL (ref 31.5–36.0)
MCV: 90.5 fL (ref 79.5–101.0)
MONO%: 6.6 % (ref 0.0–14.0)
NEUT#: 9.1 10*3/uL — ABNORMAL HIGH (ref 1.5–6.5)
NEUT%: 80.9 % — ABNORMAL HIGH (ref 38.4–76.8)
Platelets: 331 10*3/uL (ref 145–400)
RBC: 3.68 10*6/uL — ABNORMAL LOW (ref 3.70–5.45)
nRBC: 0 % (ref 0–0)

## 2013-02-01 MED ORDER — SODIUM CHLORIDE 0.9 % IV SOLN
75.0000 mg/m2 | Freq: Once | INTRAVENOUS | Status: AC
  Administered 2013-02-01: 140 mg via INTRAVENOUS
  Filled 2013-02-01: qty 14

## 2013-02-01 MED ORDER — PALONOSETRON HCL INJECTION 0.25 MG/5ML
0.2500 mg | Freq: Once | INTRAVENOUS | Status: AC
  Administered 2013-02-01: 0.25 mg via INTRAVENOUS

## 2013-02-01 MED ORDER — DEXAMETHASONE SODIUM PHOSPHATE 20 MG/5ML IJ SOLN
12.0000 mg | Freq: Once | INTRAMUSCULAR | Status: AC
  Administered 2013-02-01: 12 mg via INTRAVENOUS

## 2013-02-01 MED ORDER — DOXORUBICIN HCL CHEMO IV INJECTION 2 MG/ML
50.0000 mg/m2 | Freq: Once | INTRAVENOUS | Status: AC
  Administered 2013-02-01: 90 mg via INTRAVENOUS
  Filled 2013-02-01: qty 45

## 2013-02-01 MED ORDER — ALTEPLASE 2 MG IJ SOLR
2.0000 mg | Freq: Once | INTRAMUSCULAR | Status: AC | PRN
  Administered 2013-02-01: 2 mg
  Filled 2013-02-01: qty 2

## 2013-02-01 MED ORDER — SODIUM CHLORIDE 0.9 % IV SOLN
150.0000 mg | Freq: Once | INTRAVENOUS | Status: AC
  Administered 2013-02-01: 150 mg via INTRAVENOUS
  Filled 2013-02-01: qty 5

## 2013-02-01 MED ORDER — SODIUM CHLORIDE 0.9 % IV SOLN
Freq: Once | INTRAVENOUS | Status: AC
  Administered 2013-02-01: 13:00:00 via INTRAVENOUS

## 2013-02-01 MED ORDER — SODIUM CHLORIDE 0.9 % IV SOLN
500.0000 mg/m2 | Freq: Once | INTRAVENOUS | Status: AC
  Administered 2013-02-01: 900 mg via INTRAVENOUS
  Filled 2013-02-01: qty 45

## 2013-02-01 MED ORDER — SODIUM CHLORIDE 0.9 % IJ SOLN
10.0000 mL | INTRAMUSCULAR | Status: DC | PRN
  Administered 2013-02-01: 10 mL
  Filled 2013-02-01: qty 10

## 2013-02-01 MED ORDER — HEPARIN SOD (PORK) LOCK FLUSH 100 UNIT/ML IV SOLN
500.0000 [IU] | Freq: Once | INTRAVENOUS | Status: AC | PRN
  Administered 2013-02-01: 500 [IU]
  Filled 2013-02-01: qty 5

## 2013-02-01 NOTE — Telephone Encounter (Signed)
Pt requested to change her inj for 02/02/13 @ 1:15pm for a later time. gv her a 3pm slot....td

## 2013-02-01 NOTE — Telephone Encounter (Signed)
Per Zollie Scale, PA-only refill for #30 with no refills.

## 2013-02-01 NOTE — Progress Notes (Signed)
ID: Cynthia Griffin Griffin   DOB: [birth year]  MR#: [id number]  ZHY#:[id number]  PCP: Cynthia Griffin Reichmann, MD GYN:  SUAlmond Griffin OTHER MD: Cynthia Griffin Griffin, Cynthia Griffin Griffin  ALERT: Another patient with the same date of birth, middle initial, and also with a history of breast cancer is in actinic. Please make sure when dealing with Cynthia Griffin Griffin that you have the correct medical record number  HISTORY OF PRESENT ILLNESS: The patient tells me Cynthia Griffin had an unremarkable screening mammogram January of 2013 in [place]. I do not have that report. Shortly after that, February of 2013, Cynthia Griffin noted that her left nipple had become inverted. Cynthia Griffin initially thought this was due to the mammogram itself, but eventually brought it to her primary care physician's attention. This was followed and there was no apparent change when Cynthia Griffin was next seen December 2013. The patient however became concerned and decided to go back to [facility] for her annual mammography This is where Cynthia Griffin had her annual mammograms until the last couple of years. Bilateral diagnostic mammography and left ultrasonography there 10/24/2012 showed definite nipple deformity with inversion and retraction. There was subtle architectural distortion and increase in density relative to the preceding study. Left breast ultrasonography in addition to the nipple retraction and inversion showed an area of hyperechoic architectural distortion measuring 1.4 cm in diameter with Doppler flow. Breast specific gamma imaging was obtained February 2024 and confirmed an area of moderate intensity isotope activity following a radial distribution from the left nipple measuring at least 4 cm. 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  Bilateral breast MRI was obtained 10/31/2012 and showed an area  of diffuse non-masslike asymmetric enhancement in the inferior aspect of the left breast measuring up to 7.4 cm transversely. There were no enlarged axillary or internal mammary nodes noted and the right breast was unremarkable. The patient's subsequent history is as detailed below.  INTERVAL HISTORY: Cynthia Griffin Griffin returns  today for followup of her left breast carcinoma. Cynthia Griffin is due for day 1 cycle 3 of 6 planned q. three-week doses of docetaxel/doxorubicin/cyclophosphamide being given in the adjuvant setting. Cynthia Griffin receives Neulasta on day 2 for granulocyte support.  Cynthia Griffin Griffin just returned from a trip to [place] where Cynthia Griffin visited her son and grandchildren (ages 17 and 58) and attended the Cynthia Griffin Griffin.  Cynthia Griffin had a great trip. Although Cynthia Griffin has had some mild fatigue and shortness of breath with exertion, Cynthia Griffin was able to do all of the walking necessary to attend the race.   REVIEW OF SYSTEMS:  Cynthia Griffin denies any fevers or chills.  Cynthia Griffin's had no rashes, skin changes, abnormal bruising or bleeding. Cynthia Griffin's had no nausea or emesis and denies any change in bowel or bladder habits. Cynthia Griffin denies any cough, phlegm production, pleurisy,  chest pain or palpitations. Cynthia Griffin's had no abnormal headaches or dizziness. Cynthia Griffin's had no peripheral swelling. Cynthia Griffin has only some slight intermittent tingling in the fingertips, but nothing that is affecting her day-to-day activities or her fine motor skills.. Cynthia Griffin's had no  signs of peripheral neuropathy in the lower extremities. Cynthia Griffin denies any current myalgias, arthralgias, or bony pain. Cynthia Griffin's had no peripheral swelling.  A detailed review of systems is otherwise stable and noncontributory.   PAST MEDICAL HISTORY: Past Medical History  Diagnosis Date  . Breast cancer   . Arthritis   . Anxiety   .  Carpal tunnel syndrome of right wrist   . Family history of anesthesia complication     pneumothorax - post op 1980's    PAST SURGICAL HISTORY: Past Surgical History  Procedure Laterality Date  .  Abdominal hysterectomy    . Appendectomy    . Tonsillectomy    . Meniscus repair      Right Knee  . Mastectomy w/ sentinel node biopsy Left 11/14/2012    Procedure: MASTECTOMY WITH SENTINEL LYMPH NODE BIOPSY;  Surgeon: Cynthia Lint, MD;  Location: [facility];  Service: General;  Laterality: Left;  . Axillary lymph node dissection Left 12/01/2012    Procedure: AXILLARY LYMPH NODE DISSECTION;  Surgeon: Cynthia Lint, MD;  Location: [facility];  Service: General;  Laterality: Left;  . Portacath placement Right 12/01/2012    Procedure: INSERTION PORT-A-CATH;  Surgeon: Cynthia Lint, MD;  Location: [facility];  Service: General;  Laterality: Right;   right shoulder rotator cuff repair, right hand carpal tunnel syndrome, bilateral salpingo--oophorectomy with her hysterectomy in the 1990s, Lasix surgery, right toe surgery  FAMILY HISTORY Family History  Problem Relation Age of Onset  . Bladder Cancer Father   . Lung cancer Father    the patient's father died at the age of 73. He was diagnosed with lung cancer the age of 14 and with bladder cancer at the age of 72. He was a smoker. The patient's mother is living at age 46. Cynthia Griffin has a history of basal cell carcinoma. [name] had no brothers, one sister. There is no history of breast or ovarian cancer in the immediate family.  GYNECOLOGIC HISTORY: Menarche age 78, first live birth age 74, Cynthia Griffin is GX P2. Cynthia Griffin had her hysterectomy Cynthia Griffin thinks in 1992, and took hormone replacement for approximately 18 years. In addition Cynthia Griffin took birth control pills between 1968 and 1977 stopping only to conceive.  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 Her husband Cynthia Griffin Griffin (goes by Cynthia Griffin Griffin) is in Airline pilot for at [employer] in [place]. Son Cynthia Griffin Griffin lives in [place] and works in Chief Financial Officer. Son Cynthia Griffin Griffin lives in [place] and works in Airline pilot. The patient has 5 grandchildren" one on the  way".   ADVANCED DIRECTIVES: Not in place  HEALTH MAINTENANCE: History  Substance Use Topics  . Smoking status: Former Smoker    Quit date: 09/07/1992  . Smokeless tobacco: Never Used  . Alcohol Use: 4.2 oz/week    7 Glasses of wine per week     Colonoscopy: August 2003  PAP: Status post hysterectomy  Bone density: Never  Lipid panel:  Allergies  Allergen Reactions  . Sulfa Antibiotics Nausea Only and Other (See Comments)    Stomach cramps    Current Outpatient Prescriptions  Medication Sig Dispense Refill  . Biotin 5000 MCG CAPS Take 5,000 mcg by mouth daily.      . calcium citrate-vitamin D (CITRACAL+D) 315-200 MG-UNIT per tablet Take 1 tablet by mouth daily.      . citalopram (CELEXA) 40 MG tablet Take 40 mg by mouth daily.      . cyclobenzaprine (FLEXERIL) 5 MG tablet Take 1 tablet (5 mg total) by mouth 3 (three) times daily as needed for muscle spasms.  30 tablet  0  . dexamethasone (DECADRON) 4 MG tablet Take 1 tablet (4 mg total) by mouth 2 (two) times daily with a meal.  30 tablet  3  . docusate sodium (COLACE) 100 MG capsule Take 100 mg  by mouth 2 (two) times daily as needed for constipation.      . Glucosamine-Chondroit-Vit C-Mn (GLUCOSAMINE CHONDR 1500 COMPLX PO) Take 1 capsule by mouth daily.      Marland Kitchen HYDROcodone-acetaminophen (NORCO/VICODIN) 5-325 MG per tablet       . lidocaine-prilocaine (EMLA) cream Apply topically as needed. Apply over port area 1-2 hours before chemo, cover with plastic wrap  30 g  3  . LORazepam (ATIVAN) 0.5 MG tablet Take 1 tablet (0.5 mg total) by mouth at bedtime as needed for anxiety.  30 tablet  0  . Multiple Vitamin (MULTIVITAMIN WITH MINERALS) TABS Take 1 tablet by mouth daily.      . polyethylene glycol (MIRALAX / GLYCOLAX) packet Take 17 g by mouth daily as needed.      . prochlorperazine (COMPAZINE) 10 MG tablet Take 1 tablet (10 mg total) by mouth every 6 (six) hours as needed.  30 tablet  3  . prochlorperazine (COMPAZINE) 25 MG  suppository Place 1 suppository (25 mg total) rectally every 12 (twelve) hours as needed for nausea.  12 suppository  0  . ranitidine (ZANTAC) 150 MG tablet Take 1 tablet (150 mg total) by mouth 2 (two) times daily.  60 tablet  5  . tobramycin-dexamethasone (TOBRADEX) ophthalmic solution Place 1 drop into both eyes 2 (two) times daily at 10 AM and 5 PM.  5 mL  0   No current facility-administered medications for this visit.    OBJECTIVE: Middle-aged white woman who appears comfortable and is in no acute distress Filed Vitals:   02/01/13 1036  BP: 127/69  Pulse: 103  Temp: 98.7 F (37.1 C)  Resp: 20     Body mass index is 27.57 kg/(m^2).    ECOG FS: 1 Filed Weights   02/01/13 1036  Weight: 160 lb 11.2 oz (72.893 kg)   Sclerae unicteric Oropharynx clear, no ulcerations No cervical or supraclavicular adenopathy Lungs clear to auscultation bilaterally, no crackles, wheezes or rhonchi Heart regular rate and rhythm Abdomen soft, nontender to palpation, positive bowel sounds MSK no focal spinal tenderness to palpation,  No peripheral edema Neuro: nonfocal, well oriented, positive affect Breasts: Exam deferred today. Axillae are benign bilaterally with no palpable adenopathy. Port is intact in the right upper chest wall, no erythema, edema, or evidence of infection.   LAB RESULTS: Lab Results  Component Value Date   WBC 11.2* 02/01/2013   NEUTROABS 9.1* 02/01/2013   HGB 10.6* 02/01/2013   HCT 33.3* 02/01/2013   MCV 90.5 02/01/2013   PLT 331 02/01/2013   CMP     Component Value Date/Time   NA 139 01/17/2013 1253   NA 139 12/03/2012 0155   K 3.5 01/17/2013 1253   K 3.8 12/03/2012 0155   CL 100 01/17/2013 1253   CL 104 12/03/2012 0155   CO2 29 01/17/2013 1253   CO2 28 12/03/2012 0155   GLUCOSE 117* 01/17/2013 1253   GLUCOSE 114* 12/03/2012 0155   BUN 16.5 01/17/2013 1253   BUN 10 12/03/2012 0155   CREATININE 0.9 01/17/2013 1253   CREATININE 0.69 12/03/2012 0155   CREATININE 0.68  12/03/2012 0155   CALCIUM 9.0 01/17/2013 1253   CALCIUM 8.3* 12/03/2012 0155   PROT 6.2* 01/17/2013 1253   ALBUMIN 3.4* 01/17/2013 1253   AST 11 01/17/2013 1253   ALT 27 01/17/2013 1253   ALKPHOS 86 01/17/2013 1253   BILITOT 0.30 01/17/2013 1253   GFRNONAA 89* 12/03/2012 0155   GFRNONAA 90* 12/03/2012 0155  GFRAA >90 12/03/2012 0155   GFRAA >90 12/03/2012 0155       STUDIES:      Echocardiogram on 12/19/2012 showed an ejection fraction of 60-65%.     ASSESSMENT: 67 y.o. [place] woman status post left mastectomy and axillary lymph node dissection March 2014 for a pT3 pN2, stage IIIA invasive lobular carcinoma, grade 1, estrogen receptor 100% positive, progesterone receptor and HER-2 negative, with an MIB-1 of 8%.  (1) R pneumothorax with port placement March 2014- resolved  (2)  being treated in the adjuvant setting with docetaxel/doxorubicin/cyclophosphamide, the plan being to complete 6 q. three-week doses, with Neulasta given on day 2 for granulocyte support  (3)  GERD, likely attributed to oral dexamethasone, improved with ranitidine  PLAN:  Voula proceed to treatment today as scheduled for day 1 cycle 3 of docetaxel/doxorubicin/cyclophosphamide. Cynthia Griffin receive her Neulasta injection tomorrow, will see her next week for assessment of chemotoxicity on June 4. Cynthia Griffin voices understanding and agreement with this plan, and as always to call with any changes or problems.     Dillard Pascal    02/01/2013

## 2013-02-01 NOTE — Patient Instructions (Addendum)
[facility] Discharge Instructions for Patients Receiving Chemotherapy  Today you received the following chemotherapy agents :  Adriamycin,  Cytoxan,  Taxotere.  To help prevent nausea and vomiting after your treatment, we encourage you to take your nausea medication as instructed by your physician.    If you develop nausea and vomiting that is not controlled by your nausea medication, call the clinic. If it is after clinic hours your family physician or the after hours number for the clinic or go to the Emergency Department.   BELOW ARE SYMPTOMS THAT SHOULD BE REPORTED IMMEDIATELY:  *FEVER GREATER THAN 100.5 F  *CHILLS WITH OR WITHOUT FEVER  NAUSEA AND VOMITING THAT IS NOT CONTROLLED WITH YOUR NAUSEA MEDICATION  *UNUSUAL SHORTNESS OF BREATH  *UNUSUAL BRUISING OR BLEEDING  TENDERNESS IN MOUTH AND THROAT WITH OR WITHOUT PRESENCE OF ULCERS  *URINARY PROBLEMS  *BOWEL PROBLEMS  UNUSUAL RASH Items with * indicate a potential emergency and should be followed up as soon as possible.  One of the nurses will contact you 24 hours after your treatment. Please let the nurse know about any problems that you may have experienced. Feel free to call the clinic you have any questions or concerns. The clinic phone number is [phone].   I have been informed and understand all the instructions given to me. I know to contact the clinic, my physician, or go to the Emergency Department if any problems should occur. I do not have any questions at this time, but understand that I may call the clinic during office hours   should I have any questions or need assistance in obtaining follow up care.    __________________________________________  _____________  __________ Signature of Patient or Authorized Representative            Date                   Time    __________________________________________ Nurse's Signature

## 2013-02-01 NOTE — Progress Notes (Signed)
1220  -   Approx. 15 cc of blood obtained for right chest portacath post alteplase and wasted.  Blood drawn for labs.  IVF up for chemo today.

## 2013-02-02 ENCOUNTER — Ambulatory Visit (HOSPITAL_BASED_OUTPATIENT_CLINIC_OR_DEPARTMENT_OTHER): Payer: Medicare Other

## 2013-02-02 ENCOUNTER — Ambulatory Visit: Payer: Medicare Other

## 2013-02-02 VITALS — BP 128/53 | HR 83 | Temp 98.4°F

## 2013-02-02 DIAGNOSIS — C50312 Malignant neoplasm of lower-inner quadrant of left female breast: Secondary | ICD-10-CM

## 2013-02-02 DIAGNOSIS — C773 Secondary and unspecified malignant neoplasm of axilla and upper limb lymph nodes: Secondary | ICD-10-CM

## 2013-02-02 DIAGNOSIS — C50919 Malignant neoplasm of unspecified site of unspecified female breast: Secondary | ICD-10-CM

## 2013-02-02 MED ORDER — PEGFILGRASTIM INJECTION 6 MG/0.6ML
6.0000 mg | Freq: Once | SUBCUTANEOUS | Status: AC
  Administered 2013-02-02: 6 mg via SUBCUTANEOUS
  Filled 2013-02-02: qty 0.6

## 2013-02-06 ENCOUNTER — Other Ambulatory Visit: Payer: BC Managed Care – PPO | Admitting: Lab

## 2013-02-06 ENCOUNTER — Encounter: Payer: Self-pay | Admitting: Genetic Counselor

## 2013-02-06 ENCOUNTER — Ambulatory Visit (HOSPITAL_BASED_OUTPATIENT_CLINIC_OR_DEPARTMENT_OTHER): Payer: Medicare Other | Admitting: Genetic Counselor

## 2013-02-06 ENCOUNTER — Encounter: Payer: BC Managed Care – PPO | Admitting: Genetic Counselor

## 2013-02-06 DIAGNOSIS — C50312 Malignant neoplasm of lower-inner quadrant of left female breast: Secondary | ICD-10-CM

## 2013-02-06 DIAGNOSIS — C50319 Malignant neoplasm of lower-inner quadrant of unspecified female breast: Secondary | ICD-10-CM | POA: Diagnosis not present

## 2013-02-06 NOTE — Progress Notes (Signed)
Dr.  Raymond Gurney Magrinat requested a consultation for genetic counseling and risk assessment for [facility], a 68 y.o. female, for discussion of her personal history of breast cancer and family history of bladder, lung, and bone. She presents to clinic today to discuss the possibility of a genetic predisposition to cancer, and to further clarify her risks, as well as her family members' risks for cancer.   HISTORY OF PRESENT ILLNESS: In 2014, at the age of 42, Cynthia Griffin was diagnosed with invasive ductal carinoma of the left breast. This was treated with a left sided mastectomy on November 14, 2012 and she is currently undergoing chemotherapy. She is expecting to undergo radiation as well.  The tumor is ER+/PR-/Her2-.      Past Medical History  Diagnosis Date  . Breast cancer   . Arthritis   . Anxiety   . Carpal tunnel syndrome of right wrist   . Family history of anesthesia complication     pneumothorax - post op 1980's    Past Surgical History  Procedure Laterality Date  . Abdominal hysterectomy    . Appendectomy    . Tonsillectomy    . Meniscus repair      Right Knee  . Mastectomy w/ sentinel node biopsy Left 11/14/2012    Procedure: MASTECTOMY WITH SENTINEL LYMPH NODE BIOPSY;  Surgeon: Almond Lint, MD;  Location: [facility];  Service: General;  Laterality: Left;  . Axillary lymph node dissection Left 12/01/2012    Procedure: AXILLARY LYMPH NODE DISSECTION;  Surgeon: Almond Lint, MD;  Location: [facility];  Service: General;  Laterality: Left;  . Portacath placement Right 12/01/2012    Procedure: INSERTION PORT-A-CATH;  Surgeon: Almond Lint, MD;  Location: [facility];  Service: General;  Laterality: Right;    History  Substance Use Topics  . Smoking status: Former Smoker -- 1.25 packs/day for 29 years    Types: Cigarettes    Quit date: 09/07/1992  . Smokeless tobacco: Never Used  . Alcohol Use: 4.2 oz/week    7 Glasses of wine per week     REPRODUCTIVE HISTORY AND PERSONAL RISK ASSESSMENT FACTORS: Menarche was at age 63.   Menopause in her 15s as a result of a complete hyterectomy Uterus Intact: No Ovaries Intact: No G2P2A0, first live birth at age 58  She has not previously undergone treatment for infertility.   OCP use for 4 years   She has used HRT in the past.    FAMILY HISTORY:  We obtained a detailed, 4-generation family history.  Significant diagnoses are listed below: Family History  Problem Relation Age of Onset  . Bladder Cancer Father 61  . Lung cancer Father 66  . Bone cancer Paternal Uncle     ? late 21s  The patient was diagnosed with Breast cancer at age 78.  Her sister is 63 and has MS.  The patient's father was a heavy smoker and was diagnosed with bladder cancer at 38 and lung cancer at 67.  He had 11 brothers and sisters.  One brother had bone cancer, possibly in his late 59s.  The other siblings were cancer free, as were their children.  There is no other cancer history on either side of her family.  Patient's maternal ancestors are of [place] descent, and paternal ancestors are of [place] descent. There is no reported Ashkenazi Jewish ancestry. There is no  known consanguinity.  GENETIC COUNSELING ASSESSMENT: Shantinique Picazo is a 67 y.o.  female with a personal history of breast cancer and family history of bladder, lung and bone cancer which somewhat suggestive of a sporadic cancer or environmentally influenced cancer and not a genetic predisposition to cancer. We, therefore, discussed and recommended the following at today's visit.   DISCUSSION: We reviewed the characteristics, features and inheritance patterns of hereditary cancer syndromes. We also discussed genetic testing, including the appropriate family members to test, the process of testing, insurance coverage and turn-around-time for results. At this time, the patient does not meet the medical criteria for genetic testing put in place by  Medicare.  We did not recommended [employer] pursue genetic testing at this time.   PLAN: After considering the risks, benefits, and limitations, Lucresia Lopizzo Malenfant will not pursue genetic testing. We encouraged Dione Housekeeper to remain in contact with cancer genetics annually so that we can continuously update the family history and inform her of any changes in cancer genetics and testing that may be of benefit for her family. Sakira [employer]'s questions were answered to her satisfaction today. Our contact information was provided should additional questions or concerns arise.  The patient was seen for a total of 30 minutes, greater than 50% of which was spent face-to-face counseling.  This plan is being carried out per Dr. Feliz Beam recommendations.  This note will also be sent to the referring provider via the electronic medical record. The patient will be supplied with a summary of this genetic counseling discussion as well as educational information on the discussed hereditary cancer syndromes following the conclusion of their visit.   Patient was discussed with Dr. Drue Second.   _______________________________________________________________________ For Office Staff:  Number of people involved in session: 1 Was an Intern/ student involved with case: no

## 2013-02-08 ENCOUNTER — Telehealth: Payer: Self-pay | Admitting: Oncology

## 2013-02-08 ENCOUNTER — Ambulatory Visit (HOSPITAL_BASED_OUTPATIENT_CLINIC_OR_DEPARTMENT_OTHER): Payer: Medicare Other | Admitting: Physician Assistant

## 2013-02-08 ENCOUNTER — Telehealth: Payer: Self-pay | Admitting: *Deleted

## 2013-02-08 ENCOUNTER — Other Ambulatory Visit (HOSPITAL_BASED_OUTPATIENT_CLINIC_OR_DEPARTMENT_OTHER): Payer: Medicare Other | Admitting: Lab

## 2013-02-08 ENCOUNTER — Encounter: Payer: Self-pay | Admitting: Physician Assistant

## 2013-02-08 ENCOUNTER — Other Ambulatory Visit: Payer: Self-pay | Admitting: Internal Medicine

## 2013-02-08 VITALS — BP 111/72 | HR 105 | Temp 98.2°F | Resp 20 | Ht 64.0 in | Wt 161.5 lb

## 2013-02-08 DIAGNOSIS — Z9010 Acquired absence of unspecified breast and nipple: Secondary | ICD-10-CM

## 2013-02-08 DIAGNOSIS — C50919 Malignant neoplasm of unspecified site of unspecified female breast: Secondary | ICD-10-CM | POA: Diagnosis not present

## 2013-02-08 DIAGNOSIS — K121 Other forms of stomatitis: Secondary | ICD-10-CM

## 2013-02-08 DIAGNOSIS — Z170 Estrogen receptor positive status [ER+]: Secondary | ICD-10-CM | POA: Diagnosis not present

## 2013-02-08 DIAGNOSIS — K1231 Oral mucositis (ulcerative) due to antineoplastic therapy: Secondary | ICD-10-CM

## 2013-02-08 DIAGNOSIS — C50312 Malignant neoplasm of lower-inner quadrant of left female breast: Secondary | ICD-10-CM

## 2013-02-08 DIAGNOSIS — K1230 Oral mucositis (ulcerative), unspecified: Secondary | ICD-10-CM

## 2013-02-08 DIAGNOSIS — C50319 Malignant neoplasm of lower-inner quadrant of unspecified female breast: Secondary | ICD-10-CM

## 2013-02-08 DIAGNOSIS — D6481 Anemia due to antineoplastic chemotherapy: Secondary | ICD-10-CM

## 2013-02-08 LAB — COMPREHENSIVE METABOLIC PANEL (CC13)
Alkaline Phosphatase: 87 U/L (ref 40–150)
BUN: 9.9 mg/dL (ref 7.0–26.0)
Glucose: 142 mg/dl — ABNORMAL HIGH (ref 70–99)
Total Bilirubin: 0.31 mg/dL (ref 0.20–1.20)

## 2013-02-08 LAB — CBC WITH DIFFERENTIAL/PLATELET
Basophils Absolute: 0 10*3/uL (ref 0.0–0.1)
Eosinophils Absolute: 0.1 10*3/uL (ref 0.0–0.5)
HGB: 9.6 g/dL — ABNORMAL LOW (ref 11.6–15.9)
LYMPH%: 28 % (ref 14.0–49.7)
MCV: 88.6 fL (ref 79.5–101.0)
MONO%: 21.1 % — ABNORMAL HIGH (ref 0.0–14.0)
NEUT#: 2.7 10*3/uL (ref 1.5–6.5)
Platelets: 157 10*3/uL (ref 145–400)
RBC: 3.23 10*6/uL — ABNORMAL LOW (ref 3.70–5.45)

## 2013-02-08 MED ORDER — MAGIC MOUTHWASH W/LIDOCAINE: 5.0000 mL | Freq: Four times a day (QID) | ORAL | Status: DC | PRN

## 2013-02-08 NOTE — Telephone Encounter (Signed)
, °

## 2013-02-08 NOTE — Progress Notes (Signed)
ID: Cynthia Cynthia Griffin   DOB: Dec 16, 1945  MR#: [id number]  WUJ#:[id number]  PCP: Barbette Reichmann, MD GYN:  SUAlmond Lint OTHER MD: Dorothy Puffer, Jeralyn Ruths  ALERT: Another patient with the same date of birth, middle initial, and also with a history of breast cancer is in actinic. Please make sure when dealing with Cynthia Cynthia Griffin that you have the correct medical record number  HISTORY OF PRESENT ILLNESS: The patient tells me she had Cynthia Griffin unremarkable screening mammogram January of 2013 in [place]. I do not have that report. Shortly after that, February of 2013, she noted that her left nipple had become inverted. She initially thought this was due to the mammogram itself, but eventually brought it to her primary care physician's attention. This was followed and there was no apparent change when she was next seen December 2013. The patient however became concerned and decided to go back to [facility] for her annual mammography This is where she had her annual mammograms until the last couple of years. Bilateral diagnostic mammography and left ultrasonography there 10/24/2012 showed definite nipple deformity with inversion and retraction. There was subtle architectural distortion and increase in density relative to the preceding study. Left breast ultrasonography in addition to the nipple retraction and inversion showed Cynthia Griffin area of hyperechoic architectural distortion measuring 1.4 cm in diameter with Doppler flow. Breast specific gamma imaging was obtained February 2024 and confirmed Cynthia Griffin area of moderate intensity isotope activity following a radial distribution from the left nipple measuring at least 4 cm. 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  Bilateral breast MRI was obtained 10/31/2012 and showed Cynthia Griffin area  of diffuse non-masslike asymmetric enhancement in the inferior aspect of the left breast measuring up to 7.4 cm transversely. There were no enlarged axillary or internal mammary nodes noted and the right breast was unremarkable. The patient's subsequent history is as detailed below.  INTERVAL HISTORY: Cynthia Cynthia Griffin returns  Today accompanied by her sister for followup of her left breast carcinoma. She is currently day 8 cycle 3 of 6 planned q. three-week doses of docetaxel/doxorubicin/cyclophosphamide being given in the adjuvant setting. She receives Neulasta on day 2 for granulocyte support.  Cynthia Cynthia Griffin is feeling well with the exception of some mild fatigue and oral sensitivity. She tells me she is due at least 60% of her normal day-to-day activities, but finds that she has to rest or frequently. She does have some shortness of breath with exertion.  Cynthia Cynthia Griffin has developed some small ulcerations on the tip of her tongue which are becoming more uncomfortable. She denies any problems with eating or drinking, and is able to swallow without discomfort.  REVIEW OF SYSTEMS:  Cynthia Cynthia Griffin denies any fevers or chills.  She's had no rashes, skin changes, abnormal bruising or bleeding. She's had no nausea or emesis and denies any change in bowel or bladder habits. She tends to be mildly constipated following treatment, but this resolves with a couple of days. She denies any cough, phlegm production, pleurisy,  chest pain or palpitations. She's had no abnormal headaches or dizziness. She's had no peripheral swelling. She has only some slight intermittent tingling in the fingertips bilaterally, but nothing that is affecting her day-to-day activities or her fine motor skills. This is stable and has not worsened since her previous cycle of chemotherapy. She's had no  signs of  peripheral neuropathy in the lower extremities. She denies any current myalgias, arthralgias, or bony pain. She's had no peripheral swelling.  A detailed review of  systems is otherwise stable and noncontributory.   PAST MEDICAL HISTORY: Past Medical History  Diagnosis Date  . Breast cancer   . Arthritis   . Anxiety   . Carpal tunnel syndrome of right wrist   . Family history of anesthesia complication     pneumothorax - post op 1980's    PAST SURGICAL HISTORY: Past Surgical History  Procedure Laterality Date  . Abdominal hysterectomy    . Appendectomy    . Tonsillectomy    . Meniscus repair      Right Knee  . Mastectomy w/ sentinel node biopsy Left 11/14/2012    Procedure: MASTECTOMY WITH SENTINEL LYMPH NODE BIOPSY;  Surgeon: Almond Lint, MD;  Location: [facility] OR;  Service: General;  Laterality: Left;  . Axillary lymph node dissection Left 12/01/2012    Procedure: AXILLARY LYMPH NODE DISSECTION;  Surgeon: Almond Lint, MD;  Location: [facility];  Service: General;  Laterality: Left;  . Portacath placement Right 12/01/2012    Procedure: INSERTION PORT-A-CATH;  Surgeon: Almond Lint, MD;  Location:  SURGERY CENTER;  Service: General;  Laterality: Right;   right shoulder rotator cuff repair, right hand carpal tunnel syndrome, bilateral salpingo--oophorectomy with her hysterectomy in the 1990s, Lasix surgery, right toe surgery  FAMILY HISTORY Family History  Problem Relation Age of Onset  . Bladder Cancer Father 24  . Lung cancer Father 78  . Bone cancer Paternal Uncle     ? late 33s   the patient's father died at the age of 49. He was diagnosed with lung cancer the age of 58 and with bladder cancer at the age of 16. He was a smoker. The patient's mother is living at age 37. She has a history of basal cell carcinoma. Cynthia Cynthia Griffin had no brothers, one sister. There is no history of breast or ovarian cancer in the immediate family.  GYNECOLOGIC HISTORY: Menarche age 29, first live birth age 25, she is GX P2. She had her hysterectomy she thinks in 1992, and took hormone replacement for approximately 18 years. In addition she took  birth control pills between 1968 and 1977 stopping only to conceive.  SOCIAL HISTORY: She is a retired Environmental health practitioner. Her husband Emmamarie Kluender (goes by Cynthia Cynthia Griffin) is in Airline pilot for at [employer] in [place]. Son Cynthia Cynthia Griffin lives in [place] and works in Chief Financial Officer. Son Cynthia Cynthia Griffin lives in [place] and works in Airline pilot. The patient has 5 grandchildren" one on the way".   ADVANCED DIRECTIVES: Not in place  HEALTH MAINTENANCE: History  Substance Use Topics  . Smoking status: Former Smoker -- 1.25 packs/day for 29 years    Types: Cigarettes    Quit date: 09/07/1992  . Smokeless tobacco: Never Used  . Alcohol Use: 4.2 oz/week    7 Glasses of wine per week     Colonoscopy: August 2003  PAP: Status post hysterectomy  Bone density: Never  Lipid panel:  Allergies  Allergen Reactions  . Sulfa Antibiotics Nausea Only and Other (See Comments)    Stomach cramps    Current Outpatient Prescriptions  Medication Sig Dispense Refill  . Biotin 5000 MCG CAPS Take 5,000 mcg by mouth daily.      . calcium citrate-vitamin D (CITRACAL+D) 315-200 MG-UNIT per tablet Take 1 tablet by mouth daily.      . citalopram (  CELEXA) 40 MG tablet Take 40 mg by mouth daily.      . cyclobenzaprine (FLEXERIL) 5 MG tablet Take 1 tablet (5 mg total) by mouth 3 (three) times daily as needed for muscle spasms.  30 tablet  0  . dexamethasone (DECADRON) 4 MG tablet Take 1 tablet (4 mg total) by mouth 2 (two) times daily with a meal.  30 tablet  3  . docusate sodium (COLACE) 100 MG capsule Take 100 mg by mouth 2 (two) times daily as needed for constipation.      . Glucosamine-Chondroit-Vit C-Mn (GLUCOSAMINE CHONDR 1500 COMPLX PO) Take 1 capsule by mouth daily.      Marland Kitchen HYDROcodone-acetaminophen (NORCO/VICODIN) 5-325 MG per tablet       . lidocaine-prilocaine (EMLA) cream Apply topically as needed. Apply over port area 1-2 hours before chemo, cover with plastic wrap  30 g  3  .  LORazepam (ATIVAN) 0.5 MG tablet TAKE 1 TABLET BY MOUTH AT BEDTIME FOR ANXIETY  30 tablet  0  . Multiple Vitamin (MULTIVITAMIN WITH MINERALS) TABS Take 1 tablet by mouth daily.      . polyethylene glycol (MIRALAX / GLYCOLAX) packet Take 17 g by mouth daily as needed.      . prochlorperazine (COMPAZINE) 10 MG tablet Take 1 tablet (10 mg total) by mouth every 6 (six) hours as needed.  30 tablet  3  . prochlorperazine (COMPAZINE) 25 MG suppository Place 1 suppository (25 mg total) rectally every 12 (twelve) hours as needed for nausea.  12 suppository  0  . ranitidine (ZANTAC) 150 MG tablet Take 1 tablet (150 mg total) by mouth 2 (two) times daily.  60 tablet  5  . tobramycin-dexamethasone (TOBRADEX) ophthalmic solution Place 1 drop into both eyes 2 (two) times daily at 10 AM and 5 PM.  5 mL  0  . Alum & Mag Hydroxide-Simeth (MAGIC MOUTHWASH W/LIDOCAINE) SOLN Take 5 mLs by mouth 4 (four) times daily as needed (mouth pain and ulcerations).  240 mL  1   No current facility-administered medications for this visit.    OBJECTIVE: Middle-aged white woman who appears comfortable and is in no acute distress Filed Vitals:   02/08/13 1313  BP: 111/72  Pulse: 105  Temp: 98.2 F (36.8 C)  Resp: 20     Body mass index is 27.71 kg/(m^2).    ECOG FS: 1 Filed Weights   02/08/13 1313  Weight: 161 lb 8 oz (73.256 kg)   Sclerae unicteric Oropharynx notable for several small lesions on the tip of the tongue. The roof of the mouth is erythematous, but with no obvious ulcerations. No evidence of thrush noted. No cervical or supraclavicular adenopathy Lungs clear to auscultation bilaterally, no crackles, wheezes or rhonchi Heart regular rate and rhythm Abdomen soft, nontender to palpation, positive bowel sounds MSK no focal spinal tenderness to palpation,  No peripheral edema Neuro: nonfocal, well oriented, friendly affect Breasts: Exam deferred today. Axillae are benign bilaterally with no palpable  adenopathy. Port is intact in the right upper chest wall, no erythema, edema, or evidence of infection.   LAB RESULTS: Lab Results  Component Value Date   WBC 5.6 02/08/2013   NEUTROABS 2.7 02/08/2013   HGB 9.6* 02/08/2013   HCT 28.6* 02/08/2013   MCV 88.6 02/08/2013   PLT 157 02/08/2013   CMP     Component Value Date/Time   NA 140 02/08/2013 1245   NA 139 12/03/2012 0155   K 4.1 02/08/2013 1245  K 3.8 12/03/2012 0155   CL 106 02/08/2013 1245   CL 104 12/03/2012 0155   CO2 27 02/08/2013 1245   CO2 28 12/03/2012 0155   GLUCOSE 142* 02/08/2013 1245   GLUCOSE 114* 12/03/2012 0155   BUN 9.9 02/08/2013 1245   BUN 10 12/03/2012 0155   CREATININE 0.8 02/08/2013 1245   CREATININE 0.69 12/03/2012 0155   CREATININE 0.68 12/03/2012 0155   CALCIUM 8.5 02/08/2013 1245   CALCIUM 8.3* 12/03/2012 0155   PROT 5.8* 02/08/2013 1245   ALBUMIN 3.0* 02/08/2013 1245   AST 13 02/08/2013 1245   ALT 22 02/08/2013 1245   ALKPHOS 87 02/08/2013 1245   BILITOT 0.31 02/08/2013 1245   GFRNONAA 89* 12/03/2012 0155   GFRNONAA 90* 12/03/2012 0155   GFRAA >90 12/03/2012 0155   GFRAA >90 12/03/2012 0155       STUDIES:      Echocardiogram on 12/19/2012 showed Cynthia Griffin ejection fraction of 60-65%.     ASSESSMENT: 67 y.o. [place] woman   (1)  status post left mastectomy and axillary lymph node dissection March 2014 for a pT3 pN2, stage IIIA invasive lobular carcinoma, grade 1, estrogen receptor 100% positive, progesterone receptor and HER-2 negative, with Cynthia Griffin MIB-1 of 8%.  (2) R pneumothorax with port placement March 2014- resolved  (3)  being treated in the adjuvant setting with docetaxel/doxorubicin/cyclophosphamide, the plan being to complete 6 q. three-week doses, with Neulasta given on day 2 for granulocyte support  (4)  GERD, likely attributed to oral dexamethasone, resolved with ranitidine  PLAN:  Cynthia Cynthia Griffin continues to tolerate chemotherapy well. I am prescribing Magic mouthwash for mild mucositis and oral sensitivity, to be used up to 4  times daily for mouth pain.  She will see Dr. Donell Beers next week for followup on June 13. I will see her in 2 weeks on June 17 in anticipation of her fourth cycle of adjuvant chemotherapy. She has been scheduled out all the way through cycle 6 which will be on July 29. She will see Dr. Darnelle Catalan soon thereafter, and we will also schedule her to meet with Dr. Mitzi Hansen in early August to discuss radiation therapy.  All this was discussed in detail with [facility] today who voices understanding and agreement with our plan. She will call with any changes, questions, or problems prior to her next appointment.     Cynthia Cynthia Griffin    02/08/2013

## 2013-02-08 NOTE — Telephone Encounter (Signed)
Per staff message and POF I have scheduled appts.  [name]  

## 2013-02-13 ENCOUNTER — Ambulatory Visit (INDEPENDENT_AMBULATORY_CARE_PROVIDER_SITE_OTHER): Payer: Medicare Other | Admitting: Internal Medicine

## 2013-02-13 ENCOUNTER — Encounter: Payer: Self-pay | Admitting: Internal Medicine

## 2013-02-13 VITALS — BP 139/79 | HR 64 | Temp 97.8°F | Ht 69.0 in | Wt 185.8 lb

## 2013-02-13 DIAGNOSIS — Z0000 Encounter for general adult medical examination without abnormal findings: Secondary | ICD-10-CM

## 2013-02-13 DIAGNOSIS — E119 Type 2 diabetes mellitus without complications: Secondary | ICD-10-CM

## 2013-02-13 DIAGNOSIS — E785 Hyperlipidemia, unspecified: Secondary | ICD-10-CM

## 2013-02-13 DIAGNOSIS — I10 Essential (primary) hypertension: Secondary | ICD-10-CM

## 2013-02-13 DIAGNOSIS — D869 Sarcoidosis, unspecified: Secondary | ICD-10-CM | POA: Insufficient documentation

## 2013-02-13 DIAGNOSIS — K029 Dental caries, unspecified: Secondary | ICD-10-CM

## 2013-02-13 DIAGNOSIS — K219 Gastro-esophageal reflux disease without esophagitis: Secondary | ICD-10-CM

## 2013-02-13 DIAGNOSIS — I2510 Atherosclerotic heart disease of native coronary artery without angina pectoris: Secondary | ICD-10-CM

## 2013-02-13 LAB — BASIC METABOLIC PANEL WITHOUT GFR
BUN: 9 mg/dL (ref 6–23)
CO2: 24 meq/L (ref 19–32)
Calcium: 9.2 mg/dL (ref 8.4–10.5)
Chloride: 104 meq/L (ref 96–112)
Creat: 0.64 mg/dL (ref 0.50–1.10)
GFR, Est African American: >89 mL/min
GFR, Est Non African American: >89 mL/min
Glucose, Bld: 130 mg/dL — ABNORMAL HIGH (ref 70–99)
Potassium: 3.9 meq/L (ref 3.5–5.3)
Sodium: 139 meq/L (ref 135–145)

## 2013-02-13 LAB — LIPID PANEL
Cholesterol: 115 mg/dL (ref 0–200)
HDL: 28 mg/dL — ABNORMAL LOW
LDL Cholesterol: 61 mg/dL (ref 0–99)
Total CHOL/HDL Ratio: 4.1 ratio
Triglycerides: 130 mg/dL
VLDL: 26 mg/dL (ref 0–40)

## 2013-02-13 LAB — POCT GLYCOSYLATED HEMOGLOBIN (HGB A1C): Hemoglobin A1C: 6.9

## 2013-02-13 MED ORDER — TRAMADOL HCL 50 MG PO TABS: 100.0000 mg | ORAL_TABLET | Freq: Three times a day (TID) | ORAL | Status: DC | PRN

## 2013-02-13 NOTE — Assessment & Plan Note (Signed)
Mammogram: 4/22 - normal Dexa: 4/22 - normal BMD, previous revealed osteopenia.  Patient prefers NOT to be on bisphosphonate.  Immunizations: Needs review PAP: Last in 2007 - Needs review with patient Colonoscopy: Discussed at length today (this is why other issues not reviewed today).  She understands the risks of screening or not screening.  She has no FH of colon cancer.  She prefers to defer screening at this time.

## 2013-02-13 NOTE — Assessment & Plan Note (Signed)
Will ask [name] to see [name] re: qualifying for dental assistance card.  If she does not qualify, will discuss with clinic management re: other options in the area.

## 2013-02-13 NOTE — Assessment & Plan Note (Signed)
Seems to be well controlled.  Unclear if angina like symptoms may be related to GERD?  Continue ranitidine for now.  Consider trial of PPI in the future.

## 2013-02-13 NOTE — Assessment & Plan Note (Signed)
Risk factor modification -   Good control of DM, BP and HLD.  Continue aspirin.   She has continued atypical angina.  I instructed her on warning signs of worsening angina, angina not controlled with medications or rest.  It is unclear if her daily chest pain is related to her CAD or to Sarcoidosis or to something else.  Will continue to monitor.  Asked her to call in for any change in the chest pain.

## 2013-02-13 NOTE — Patient Instructions (Addendum)
General Instructions: Please schedule a follow up visit within the next 3 months.   For your medications:   Please bring all of your pill  Bottles with you to each visit.  This will help make sure that we have an up to date list of all the medications you are taking.  Please also bring any over the counter herbal medications you are taking (not including advil, tylenol, etc.)  Please continue taking your diabetes, blood pressure and cholesterol medications.  Also continue tramadol, vitamin D and calcium, zantac and aspirin  Please stop taking alendronate (fosamax)  For your Diabetes:   Please continue to check your blood sugar at least once a day.   Please keep a record of your blood sugars, either in your glucometer or on paper.  Please bring your glucometer to every clinic visit.  Please check your feet at least once weekly to see if you are developing calluses or wounds.  Continue to follow a good diet, including limiting refined sugars and carbohydrates and exercising at least 3 times per week.   Please schedule an eye exam within the next year.   Please see Rudell Cobb to check for assistance seeing a Dentist.   Please go to the lab for blood work.   Thank you!   Treatment Goals:  Goals (1 Years of Data) as of 02/13/13         As of Today 11/08/12 09/12/12 09/12/12 11/17/11     Blood Pressure    . Blood Pressure < 140/90  139/79 175/89 141/84 150/82 158/85     Result Component    . HEMOGLOBIN A1C < 7.0  6.9  7.6  6.7    . LDL CALC < 100      45      Progress Toward Treatment Goals:  Treatment Goal 02/13/2013  Hemoglobin A1C at goal  Blood pressure at goal    Self Care Goals & Plans:  Self Care Goal 02/13/2013  Manage my medications bring my medications to every visit; take my medicines as prescribed  Monitor my health bring my glucose meter and log to each visit; keep track of my blood glucose    Home Blood Glucose Monitoring 02/13/2013  Check my blood sugar once a day      Care Management & Community Referrals:  Referral 02/13/2013  Referrals made for care management support none needed

## 2013-02-13 NOTE — Assessment & Plan Note (Signed)
She is not being treated for this disease.  It may be beneficial to obtain records of the diagnosis from previous physicians.

## 2013-02-13 NOTE — Assessment & Plan Note (Addendum)
LDL 45 at last check, due for repeat.  Lipid profile pending for today.   Continue pravastatin  UPDATE: LDL 61, continue current therapy.

## 2013-02-13 NOTE — Assessment & Plan Note (Signed)
Lab Results  Component Value Date   HGBA1C 6.9 02/13/2013   HGBA1C 7.6 09/12/2012   HGBA1C 6.7 11/17/2011     Assessment: Diabetes control: good control (HgbA1C at goal) Progress toward A1C goal:  at goal Comments:  CBGs range: 117-140, log not brought in  LDL: 45 MAU/Cr < 30 Foot - due today  Plan: Medications:  continue current medications Home glucose monitoring: Frequency: once a day Timing:   Instruction/counseling given: reminded to get eye exam, reminded to bring blood glucose meter & log to each visit, reminded to bring medications to each visit, discussed foot care and discussed diet Educational resources provided: brochure Self management tools provided:   Other plans: Follow up in 3 months, advised regular meals and snacks on hand as needed.

## 2013-02-13 NOTE — Progress Notes (Signed)
Subjective:    Patient ID: [name], female    DOB: 08/17/1946, 67 y.o.   MRN: [id number]  CC: Follow up of DM, establish care with PCP  HPI  This is my first time seeing [name].   [name] is a 67yo woman who presents today for follow up of her chronic health issues.   [name] has a multiple year history of chest pain.  She reports the pain as daily, starting when she wakes in the morning.  She takes tramadol 100mg  every 8 hours for the pain with good results and improvement in the pain.  She describes the pain as a "tight" feeling in her chest which is reproducible.  It is only sometimes worse with activity, particularly lifting things.  She also has SOB, but it does not seem to be related to the chest pain.  She notes that without the tramadol the pain will last all day regardless of what she does.  She has been worked up for this in 2008, saw Cardiology at that time. She had a normal myoview on July 29th 2008 and a normal TTE with EF of 60%. She had a CT of the chest at that time that revealed coronary atherosclerosis as well.  It was recommended that she follow up with Cardiology (not done) and that she have good risk factor modification.   Dental Caries: She has had this issue for a while.  She has lost most of her upper teeth, multiple broken teeth with roots left behind and multiple caries.  She has medicare and no dental coverage.   Breast Cancer treatment: She just recently finished with arimidex.  She had a recent DEXA scan which was read as normal BMD.  She is to follow with Oncology in a year.   Increased wax build up in the ear: She is using drops which helps. No change.   [name] also tells me that she was diagnosed with sarcoidosis "a while ago" when she was living outside of [place].  She used to be on oxygen therapy, but no longer needs it.   She reports good control of her diabetes.  2 low sugar episodes, which quickly resolved with eating.  She notes that this was  due to not eating after taking her insulin.  Daughter was in room and confirmed.    PMH:  DM2 GERD HLD HTN Schizophrenia Breast Cancer - followed by oncology Osteopenia Sarcoidosis  H/O chest pain - going on for a few years.  MVP CAD - 2008 work up; seen by Cardiologist at that time, normal myoview.   PSH: Tubal ligation and reverse of ligation, breast cancer surgery, tonsillectomy, biopsy of supraclavicular lymph node - diagnosed with sarcoidosis.    Allergies: Penicillins  Social History: Lives with daughter.  No smoking, quit 10 years ago. No ETOH.  No illicit drug use.   Family History: No family history of cancer.  Many with diabetes and HTN.  No known cancers  Medications: Metformin, lantus, lisinopril, metoprolol, pravastatin, aspirin, olanzapine, alendronate, ranitidine, tramadol.   Review of Systems  Constitutional: Negative for fever, chills and fatigue.  HENT: Positive for hearing loss. Negative for ear pain, sore throat, trouble swallowing and tinnitus.   Eyes: Positive for pain (h/o cataracts). Negative for redness and visual disturbance.  Respiratory: Positive for shortness of breath (sits down and gets better, better than it used to be). Negative for cough.   Cardiovascular: Positive for chest pain and leg swelling (with standing  for a long time).  Gastrointestinal: Positive for nausea (coughs up some mucus and occasional throws up, from sinus congestion). Negative for abdominal pain and blood in stool.  Genitourinary: Negative for dysuria, frequency and difficulty urinating.  Musculoskeletal: Positive for back pain (occasional). Negative for gait problem.  Skin: Negative for color change, pallor and rash.  Neurological: Positive for dizziness (happens when blood sugar low or BP high) and tremors (sometimes). Negative for syncope and light-headedness.  Psychiatric/Behavioral: Negative for dysphoric mood and decreased concentration.       Objective:   Physical  Exam  Constitutional: She is oriented to person, place, and time. She appears well-developed and well-nourished. No distress.  HENT:  Head: Normocephalic and atraumatic.  Nose: Nose normal.  Mouth/Throat: Uvula is midline and mucous membranes are normal. No oral lesions. Abnormal dentition. Dental caries present. No dental abscesses or edematous. No oropharyngeal exudate.  Eyes: Conjunctivae are normal. Pupils are equal, round, and reactive to light. Right eye exhibits no discharge. Left eye exhibits no discharge. No scleral icterus.  Neck: Neck supple.  Cardiovascular: Normal rate, regular rhythm, normal heart sounds and intact distal pulses.   No murmur heard. Pulmonary/Chest: Effort normal and breath sounds normal. No respiratory distress. She has no wheezes.  Abdominal: Bowel sounds are normal. There is no tenderness.  Musculoskeletal: Normal range of motion. She exhibits no edema and no tenderness.  Lymphadenopathy:    She has no cervical adenopathy.  Neurological: She is alert and oriented to person, place, and time. She has normal strength.  Skin: Skin is warm and dry. No erythema.  Psychiatric: She has a normal mood and affect. Her behavior is normal.   A1C: 6.9     Assessment & Plan:  RTC in 3 months for follow up of her DM.

## 2013-02-13 NOTE — Assessment & Plan Note (Addendum)
BP Readings from Last 3 Encounters:  02/13/13 139/79  11/08/12 175/89  09/12/12 141/84    Lab Results  Component Value Date   NA 139 11/08/2012   K 3.4* 11/08/2012   CREATININE 0.6 11/08/2012    Assessment: Blood pressure control: controlled Progress toward BP goal:  at goal Comments: much improved compared to last visit  Plan: Medications:  continue current medications Educational resources provided: brochure Self management tools provided: home blood pressure logbook Other plans: Low salt diet, continue exercising as tolerated.   UPDATE: BUN/Cr 9/0.64, normal GFR

## 2013-02-14 ENCOUNTER — Encounter: Payer: Self-pay | Admitting: Dietician

## 2013-02-17 ENCOUNTER — Encounter (INDEPENDENT_AMBULATORY_CARE_PROVIDER_SITE_OTHER): Payer: Self-pay | Admitting: General Surgery

## 2013-02-17 ENCOUNTER — Ambulatory Visit (INDEPENDENT_AMBULATORY_CARE_PROVIDER_SITE_OTHER): Payer: Medicare Other | Admitting: General Surgery

## 2013-02-17 VITALS — BP 130/62 | HR 80 | Temp 97.8°F | Resp 18 | Ht 63.75 in | Wt 164.0 lb

## 2013-02-17 DIAGNOSIS — I890 Lymphedema, not elsewhere classified: Secondary | ICD-10-CM

## 2013-02-17 DIAGNOSIS — C50312 Malignant neoplasm of lower-inner quadrant of left female breast: Secondary | ICD-10-CM

## 2013-02-17 DIAGNOSIS — C50319 Malignant neoplasm of lower-inner quadrant of unspecified female breast: Secondary | ICD-10-CM

## 2013-02-17 NOTE — Assessment & Plan Note (Signed)
No clinical evidence of breast cancer.  Follow up in 6 months.  Continue chemo.  Refer to plastics for discussion of reconstruction.  Will be post radiation.

## 2013-02-17 NOTE — Progress Notes (Signed)
HISTORY: Pt is 2.5 months s/p left MRM for cancer.  She is currently receiving chemotherapy.  She is managing her chemo symptoms well.  She is having some swelling and heaviness of left arm.  She denies fever/chills.  She has not felt any other masses.      EXAM: General:  Alert and oriented.  Incision:  Healing well.  No issues with flap.     PATHOLOGY: See prior.     ASSESSMENT AND PLAN:   Lymphedema left arm Refer to physical therapy for lymphedema treatment.      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  Follow up in 6 months.  Continue chemo.  Refer to plastics for discussion of reconstruction.  Will be post radiation.        Maudry Diego, MD Surgical Oncology, General & Endocrine Surgery [facility]  Barbette Reichmann, MD Barbette Reichmann, MD

## 2013-02-17 NOTE — Patient Instructions (Signed)
Will have you see Dr. Odis Luster of plastic surgery.  Will also set you up with PT to evaluate for lymphedema.    Follow up with me in 6 months.

## 2013-02-17 NOTE — Assessment & Plan Note (Signed)
Refer to physical therapy for lymphedema treatment.

## 2013-02-21 ENCOUNTER — Encounter: Payer: Self-pay | Admitting: Physician Assistant

## 2013-02-21 ENCOUNTER — Ambulatory Visit (HOSPITAL_BASED_OUTPATIENT_CLINIC_OR_DEPARTMENT_OTHER): Payer: Medicare Other | Admitting: Physician Assistant

## 2013-02-21 ENCOUNTER — Other Ambulatory Visit: Payer: BC Managed Care – PPO | Admitting: Lab

## 2013-02-21 ENCOUNTER — Telehealth: Payer: Self-pay | Admitting: *Deleted

## 2013-02-21 ENCOUNTER — Other Ambulatory Visit (HOSPITAL_BASED_OUTPATIENT_CLINIC_OR_DEPARTMENT_OTHER): Payer: Medicare Other | Admitting: Lab

## 2013-02-21 ENCOUNTER — Ambulatory Visit (HOSPITAL_BASED_OUTPATIENT_CLINIC_OR_DEPARTMENT_OTHER): Payer: Medicare Other

## 2013-02-21 VITALS — BP 136/69 | HR 96 | Temp 97.8°F | Resp 20 | Ht 63.75 in | Wt 161.5 lb

## 2013-02-21 DIAGNOSIS — C50919 Malignant neoplasm of unspecified site of unspecified female breast: Secondary | ICD-10-CM

## 2013-02-21 DIAGNOSIS — I890 Lymphedema, not elsewhere classified: Secondary | ICD-10-CM | POA: Diagnosis not present

## 2013-02-21 DIAGNOSIS — C50319 Malignant neoplasm of lower-inner quadrant of unspecified female breast: Secondary | ICD-10-CM

## 2013-02-21 DIAGNOSIS — K219 Gastro-esophageal reflux disease without esophagitis: Secondary | ICD-10-CM

## 2013-02-21 DIAGNOSIS — C50312 Malignant neoplasm of lower-inner quadrant of left female breast: Secondary | ICD-10-CM

## 2013-02-21 DIAGNOSIS — Z170 Estrogen receptor positive status [ER+]: Secondary | ICD-10-CM | POA: Diagnosis not present

## 2013-02-21 DIAGNOSIS — Z5111 Encounter for antineoplastic chemotherapy: Secondary | ICD-10-CM | POA: Diagnosis not present

## 2013-02-21 DIAGNOSIS — T451X5A Adverse effect of antineoplastic and immunosuppressive drugs, initial encounter: Secondary | ICD-10-CM

## 2013-02-21 LAB — CBC WITH DIFFERENTIAL/PLATELET
Basophils Absolute: 0 10*3/uL (ref 0.0–0.1)
Eosinophils Absolute: 0 10*3/uL (ref 0.0–0.5)
HGB: 10.3 g/dL — ABNORMAL LOW (ref 11.6–15.9)
LYMPH%: 14 % (ref 14.0–49.7)
MCV: 90.8 fL (ref 79.5–101.0)
MONO%: 8.9 % (ref 0.0–14.0)
NEUT#: 8 10*3/uL — ABNORMAL HIGH (ref 1.5–6.5)
Platelets: 420 10*3/uL — ABNORMAL HIGH (ref 145–400)

## 2013-02-21 LAB — COMPREHENSIVE METABOLIC PANEL (CC13)
CO2: 23 mEq/L (ref 22–29)
Calcium: 9.4 mg/dL (ref 8.4–10.4)
Glucose: 97 mg/dl (ref 70–99)
Sodium: 139 mEq/L (ref 136–145)
Total Bilirubin: 0.3 mg/dL (ref 0.20–1.20)
Total Protein: 6.8 g/dL (ref 6.4–8.3)

## 2013-02-21 MED ORDER — SODIUM CHLORIDE 0.9 % IV SOLN
75.0000 mg/m2 | Freq: Once | INTRAVENOUS | Status: AC
  Administered 2013-02-21: 140 mg via INTRAVENOUS
  Filled 2013-02-21: qty 14

## 2013-02-21 MED ORDER — SODIUM CHLORIDE 0.9 % IV SOLN
Freq: Once | INTRAVENOUS | Status: AC
  Administered 2013-02-21: 12:00:00 via INTRAVENOUS

## 2013-02-21 MED ORDER — SODIUM CHLORIDE 0.9 % IV SOLN
150.0000 mg | Freq: Once | INTRAVENOUS | Status: AC
  Administered 2013-02-21: 150 mg via INTRAVENOUS
  Filled 2013-02-21: qty 5

## 2013-02-21 MED ORDER — DEXAMETHASONE SODIUM PHOSPHATE 20 MG/5ML IJ SOLN
12.0000 mg | Freq: Once | INTRAMUSCULAR | Status: AC
  Administered 2013-02-21: 12 mg via INTRAVENOUS

## 2013-02-21 MED ORDER — SODIUM CHLORIDE 0.9 % IV SOLN
500.0000 mg/m2 | Freq: Once | INTRAVENOUS | Status: AC
  Administered 2013-02-21: 900 mg via INTRAVENOUS
  Filled 2013-02-21: qty 45

## 2013-02-21 MED ORDER — HEPARIN SOD (PORK) LOCK FLUSH 100 UNIT/ML IV SOLN
500.0000 [IU] | Freq: Once | INTRAVENOUS | Status: AC | PRN
  Administered 2013-02-21: 500 [IU]
  Filled 2013-02-21: qty 5

## 2013-02-21 MED ORDER — SODIUM CHLORIDE 0.9 % IJ SOLN
10.0000 mL | INTRAMUSCULAR | Status: DC | PRN
  Administered 2013-02-21: 10 mL
  Filled 2013-02-21: qty 10

## 2013-02-21 MED ORDER — PALONOSETRON HCL INJECTION 0.25 MG/5ML
0.2500 mg | Freq: Once | INTRAVENOUS | Status: AC
  Administered 2013-02-21: 0.25 mg via INTRAVENOUS

## 2013-02-21 MED ORDER — DOXORUBICIN HCL CHEMO IV INJECTION 2 MG/ML
50.0000 mg/m2 | Freq: Once | INTRAVENOUS | Status: AC
  Administered 2013-02-21: 90 mg via INTRAVENOUS
  Filled 2013-02-21: qty 45

## 2013-02-21 NOTE — Progress Notes (Signed)
ID: Cynthia Griffin   DOB: 1946-03-14  MR#: [id number]  WUJ#:[id number]  PCP: Barbette Reichmann, MD GYN:  SUAlmond Lint OTHER MD: Dorothy Puffer, Jeralyn Ruths, Etter Sjogren  ALERT: Another patient with the same date of birth, middle initial, and also with a history of breast cancer is in actinic. Please make sure when dealing with [name] that you have the correct medical record number  HISTORY OF PRESENT ILLNESS: The patient tells me she had an unremarkable screening mammogram January of 2013 in [place]. I do not have that report. Shortly after that, February of 2013, she noted that her left nipple had become inverted. She initially thought this was due to the mammogram itself, but eventually brought it to her primary care physician's attention. This was followed and there was no apparent change when she was next seen December 2013. The patient however became concerned and decided to go back to [facility] for her annual mammography This is where she had her annual mammograms until the last couple of years. Bilateral diagnostic mammography and left ultrasonography there 10/24/2012 showed definite nipple deformity with inversion and retraction. There was subtle architectural distortion and increase in density relative to the preceding study. Left breast ultrasonography in addition to the nipple retraction and inversion showed an area of hyperechoic architectural distortion measuring 1.4 cm in diameter with Doppler flow. Breast specific gamma imaging was obtained February 2024 and confirmed an area of moderate intensity isotope activity following a radial distribution from the left nipple measuring at least 4 cm. 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  Bilateral breast MRI was obtained 10/31/2012 and  showed an area of diffuse non-masslike asymmetric enhancement in the inferior aspect of the left breast measuring up to 7.4 cm transversely. There were no enlarged axillary or internal mammary nodes noted and the right breast was unremarkable. The patient's subsequent history is as detailed below.  INTERVAL HISTORY: Cynthia Griffin returns today  for followup of her left breast carcinoma. She is due for day 1 cycle 4 of 6 planned q. three-week doses of docetaxel/doxorubicin/cyclophosphamide being given in the adjuvant setting. She receives Neulasta on day 2 for granulocyte support.  Cynthia Griffin is feeling well today, and feels that she has recovered well from cycle 3. She had previously noted some increase in neuropathy bilaterally in her fingers. This has improved significantly, and now comes and goes. It is not affecting any of her day-to-day activities were fine motor skills. She also has some occasional numbness in her left toes which she describes as mild.   REVIEW OF SYSTEMS:  Cynthia Griffin denies any fevers or chills.  She's had no rashes, skin changes, abnormal bruising or bleeding. She's had no nausea or emesis and denies any change in bowel or bladder habits. She's noticed an increased dry cough over the past week, nonproductive. She denies any phlegm production, shortness of breath, pleurisy,  chest pain or palpitations. She's had no abnormal headaches or dizziness. She has begun to have some slight swelling in the left upper extremity, primarily in the forearm.  She denies any current myalgias, arthralgias, or bony pain.   A detailed review of systems is otherwise stable and noncontributory.   PAST MEDICAL HISTORY: Past Medical History  Diagnosis Date  . Breast cancer   . Arthritis   . Anxiety   . Carpal  tunnel syndrome of right wrist   . Family history of anesthesia complication     pneumothorax - post op 1980's    PAST SURGICAL HISTORY: Past Surgical History  Procedure Laterality Date  . Abdominal  hysterectomy    . Appendectomy    . Tonsillectomy    . Meniscus repair      Right Knee  . Mastectomy w/ sentinel node biopsy Left 11/14/2012    Procedure: MASTECTOMY WITH SENTINEL LYMPH NODE BIOPSY;  Surgeon: Almond Lint, MD;  Location: [facility] OR;  Service: General;  Laterality: Left;  . Axillary lymph node dissection Left 12/01/2012    Procedure: AXILLARY LYMPH NODE DISSECTION;  Surgeon: Almond Lint, MD;  Location: [facility];  Service: General;  Laterality: Left;  . Portacath placement Right 12/01/2012    Procedure: INSERTION PORT-A-CATH;  Surgeon: Almond Lint, MD;  Location: [facility];  Service: General;  Laterality: Right;   right shoulder rotator cuff repair, right hand carpal tunnel syndrome, bilateral salpingo--oophorectomy with her hysterectomy in the 1990s, Lasix surgery, right toe surgery  FAMILY HISTORY Family History  Problem Relation Age of Onset  . Bladder Cancer Father 33  . Lung cancer Father 46  . Bone cancer Paternal Uncle     ? late 104s   the patient's father died at the age of 9. He was diagnosed with lung cancer the age of 71 and with bladder cancer at the age of 34. He was a smoker. The patient's mother is living at age 52. She has a history of basal cell carcinoma. Sible had no brothers, one sister. There is no history of breast or ovarian cancer in the immediate family.  GYNECOLOGIC HISTORY: Menarche age 33, first live birth age 9, she is GX P2. She had her hysterectomy she thinks in 1992, and took hormone replacement for approximately 18 years. In addition she took birth control pills between 1968 and 1977 stopping only to conceive.  SOCIAL HISTORY: She is a retired Environmental health practitioner. Her husband Akaya Proffit (goes by Fredrik Cove) is in Airline pilot for at [employer] in [place]. Son Cynthia Griffin lives in [place] and works in Chief Financial Officer. Son Cynthia Griffin lives in [place] and works in Airline pilot. The patient  has 5 grandchildren" one on the way".   ADVANCED DIRECTIVES: Not in place  HEALTH MAINTENANCE: History  Substance Use Topics  . Smoking status: Former Smoker -- 1.25 packs/day for 29 years    Types: Cigarettes    Quit date: 09/07/1992  . Smokeless tobacco: Never Used  . Alcohol Use: 4.2 oz/week    7 Glasses of wine per week     Colonoscopy: August 2003  PAP: Status post hysterectomy  Bone density: Never  Lipid panel:  Allergies  Allergen Reactions  . Sulfa Antibiotics Nausea Only and Other (See Comments)    Stomach cramps    Current Outpatient Prescriptions  Medication Sig Dispense Refill  . Alum & Mag Hydroxide-Simeth (MAGIC MOUTHWASH W/LIDOCAINE) SOLN Take 5 mLs by mouth 4 (four) times daily as needed (mouth pain and ulcerations).  240 mL  1  . Biotin 5000 MCG CAPS Take 5,000 mcg by mouth daily.      . calcium citrate-vitamin D (CITRACAL+D) 315-200 MG-UNIT per tablet Take 1 tablet by mouth daily.      . citalopram (CELEXA) 40 MG tablet Take 40 mg by mouth daily.      . cyclobenzaprine (FLEXERIL) 5 MG tablet Take 1 tablet (5 mg total) by  mouth 3 (three) times daily as needed for muscle spasms.  30 tablet  0  . dexamethasone (DECADRON) 4 MG tablet Take 1 tablet (4 mg total) by mouth 2 (two) times daily with a meal.  30 tablet  3  . docusate sodium (COLACE) 100 MG capsule Take 100 mg by mouth 2 (two) times daily as needed for constipation.      . Glucosamine-Chondroit-Vit C-Mn (GLUCOSAMINE CHONDR 1500 COMPLX PO) Take 1 capsule by mouth daily.      Marland Kitchen HYDROcodone-acetaminophen (NORCO/VICODIN) 5-325 MG per tablet       . lidocaine-prilocaine (EMLA) cream Apply topically as needed. Apply over port area 1-2 hours before chemo, cover with plastic wrap  30 g  3  . LORazepam (ATIVAN) 0.5 MG tablet TAKE 1 TABLET BY MOUTH AT BEDTIME FOR ANXIETY  30 tablet  0  . Multiple Vitamin (MULTIVITAMIN WITH MINERALS) TABS Take 1 tablet by mouth daily.      . polyethylene glycol (MIRALAX / GLYCOLAX)  packet Take 17 g by mouth daily as needed.      . prochlorperazine (COMPAZINE) 10 MG tablet Take 1 tablet (10 mg total) by mouth every 6 (six) hours as needed.  30 tablet  3  . prochlorperazine (COMPAZINE) 25 MG suppository Place 1 suppository (25 mg total) rectally every 12 (twelve) hours as needed for nausea.  12 suppository  0  . ranitidine (ZANTAC) 150 MG tablet Take 1 tablet (150 mg total) by mouth 2 (two) times daily.  60 tablet  5  . tobramycin-dexamethasone (TOBRADEX) ophthalmic solution Place 1 drop into both eyes 2 (two) times daily at 10 AM and 5 PM.  5 mL  0   No current facility-administered medications for this visit.   Facility-Administered Medications Ordered in Other Visits  Medication Dose Route Frequency Provider Last Rate Last Dose  . cyclophosphamide (CYTOXAN) 900 mg in sodium chloride 0.9 % 250 mL chemo infusion  500 mg/m2 (Treatment Plan Actual) Intravenous Once Mairyn Lenahan G Cambri Plourde, PA-C      . DOCEtaxel (TAXOTERE) 140 mg in sodium chloride 0.9 % 250 mL chemo infusion  75 mg/m2 (Treatment Plan Actual) Intravenous Once Sumayya Muha G Elier Zellars, PA-C      . DOXOrubicin (ADRIAMYCIN) chemo injection 90 mg  50 mg/m2 (Treatment Plan Actual) Intravenous Once Catalina Gravel, PA-C   90 mg at 02/21/13 1312  . heparin lock flush 100 unit/mL  500 Units Intracatheter Once PRN Lamae Fosco G Staisha Winiarski, PA-C      . sodium chloride 0.9 % injection 10 mL  10 mL Intracatheter PRN Crystol Walpole Allegra Grana, PA-C        OBJECTIVE: Middle-aged white woman who appears comfortable and is in no acute distress Filed Vitals:   02/21/13 1039  BP: 136/69  Pulse: 96  Temp: 97.8 F (36.6 C)  Resp: 20     Body mass index is 27.95 kg/(m^2).    ECOG FS: 1 Filed Weights   02/21/13 1039  Weight: 161 lb 8 oz (73.256 kg)   Sclerae unicteric Oropharynx is clear with no ulcerations. No evidence of thrush noted. No cervical or supraclavicular adenopathy Lungs clear to auscultation bilaterally, no crackles, wheezes or rhonchi Heart regular rate and  rhythm Abdomen soft, nontender to palpation, positive bowel sounds MSK no focal spinal tenderness to palpation  Nonpitting edema noted in the left upper extremity. Otherwise, no peripheral edema Neuro: nonfocal, well oriented, friendly affect Breasts: Exam deferred today. Axillae are benign bilaterally with no palpable adenopathy. Port is intact  in the right upper chest wall, no erythema, edema, or evidence of infection.   LAB RESULTS: Lab Results  Component Value Date   WBC 10.4* 02/21/2013   NEUTROABS 8.0* 02/21/2013   HGB 10.3* 02/21/2013   HCT 32.4* 02/21/2013   MCV 90.8 02/21/2013   PLT 420* 02/21/2013   CMP     Component Value Date/Time   NA 139 02/21/2013 1028   NA 139 12/03/2012 0155   K 3.8 02/21/2013 1028   K 3.8 12/03/2012 0155   CL 107 02/21/2013 1028   CL 104 12/03/2012 0155   CO2 23 02/21/2013 1028   CO2 28 12/03/2012 0155   GLUCOSE 97 02/21/2013 1028   GLUCOSE 114* 12/03/2012 0155   BUN 15.7 02/21/2013 1028   BUN 10 12/03/2012 0155   CREATININE 0.7 02/21/2013 1028   CREATININE 0.69 12/03/2012 0155   CREATININE 0.68 12/03/2012 0155   CALCIUM 9.4 02/21/2013 1028   CALCIUM 8.3* 12/03/2012 0155   PROT 6.8 02/21/2013 1028   ALBUMIN 3.3* 02/21/2013 1028   AST 15 02/21/2013 1028   ALT 23 02/21/2013 1028   ALKPHOS 76 02/21/2013 1028   BILITOT 0.30 02/21/2013 1028   GFRNONAA 89* 12/03/2012 0155   GFRNONAA 90* 12/03/2012 0155   GFRAA >90 12/03/2012 0155   GFRAA >90 12/03/2012 0155       STUDIES:      Echocardiogram on 12/19/2012 showed an ejection fraction of 60-65%.   ASSESSMENT: 67 y.o. [place] woman   (1)  status post left mastectomy and axillary lymph node dissection March 2014 for a pT3 pN2, stage IIIA invasive lobular carcinoma, grade 1, estrogen receptor 100% positive, progesterone receptor and HER-2 negative, with an MIB-1 of 8%.  (2) R pneumothorax with port placement March 2014- resolved  (3)  being treated in the adjuvant setting with  docetaxel/doxorubicin/cyclophosphamide, the plan being to complete 6 q. three-week doses, with Neulasta given on day 2 for granulocyte support  (4)  GERD, likely attributed to oral dexamethasone, improved with ranitidine  (5)  lymphedema, left upper extremity   PLAN:  Arcenia continues to tolerate chemotherapy well and will proceed to treatment today as scheduled for day 1 cycle 4 of docetaxel/doxorubicin/cyclophosphamide. She will receive Neulasta tomorrow, and I will see her next week for followup on June 24.  Resume continue on her ranitidine, and we'll also increase her use of Claritin to see if these help with her dry cough. Of course, if it worsens  Or becomes productive, or if she has any fevers, she will let us know.  We will continue to follow Arabelle very closely for any worsening peripheral neuropathy, but fortunately at this point it is mild and stable. I am referring Teddy to the lymphedema clinic for further evaluation of lymphedema in the left upper extremity. She will also need to see Dr. Mitzi Hansen in early August if she completes her adjuvant chemotherapy, and we will get all of these point is scheduled for her accordingly.   She voices understanding and agreement with this plan, and will call with any changes or problems.    Quinterious Walraven    02/21/2013

## 2013-02-21 NOTE — Patient Instructions (Addendum)
[facility] Discharge Instructions for Patients Receiving Chemotherapy  Today you received the following chemotherapy agents Doxorubicin, Cytoxan and Taxotere.  To help prevent nausea and vomiting after your treatment, we encourage you to take your nausea medication.   If you develop nausea and vomiting that is not controlled by your nausea medication, call the clinic.   BELOW ARE SYMPTOMS THAT SHOULD BE REPORTED IMMEDIATELY:  *FEVER GREATER THAN 100.5 F  *CHILLS WITH OR WITHOUT FEVER  NAUSEA AND VOMITING THAT IS NOT CONTROLLED WITH YOUR NAUSEA MEDICATION  *UNUSUAL SHORTNESS OF BREATH  *UNUSUAL BRUISING OR BLEEDING  TENDERNESS IN MOUTH AND THROAT WITH OR WITHOUT PRESENCE OF ULCERS  *URINARY PROBLEMS  *BOWEL PROBLEMS  UNUSUAL RASH Items with * indicate a potential emergency and should be followed up as soon as possible.  Feel free to call the clinic you have any questions or concerns. The clinic phone number is [phone].    

## 2013-02-21 NOTE — Telephone Encounter (Signed)
appts made and printed. Made pt aware that i sw Harriett Sine and she will call the pt today around lunch time to give her an appt...td

## 2013-02-22 ENCOUNTER — Ambulatory Visit (HOSPITAL_BASED_OUTPATIENT_CLINIC_OR_DEPARTMENT_OTHER): Payer: Medicare Other

## 2013-02-22 ENCOUNTER — Encounter: Payer: Self-pay | Admitting: Internal Medicine

## 2013-02-22 VITALS — BP 143/52 | HR 84 | Temp 98.1°F

## 2013-02-22 DIAGNOSIS — C50319 Malignant neoplasm of lower-inner quadrant of unspecified female breast: Secondary | ICD-10-CM | POA: Diagnosis not present

## 2013-02-22 DIAGNOSIS — C50312 Malignant neoplasm of lower-inner quadrant of left female breast: Secondary | ICD-10-CM

## 2013-02-22 MED ORDER — PEGFILGRASTIM INJECTION 6 MG/0.6ML
6.0000 mg | Freq: Once | SUBCUTANEOUS | Status: AC
  Administered 2013-02-22: 6 mg via SUBCUTANEOUS
  Filled 2013-02-22: qty 0.6

## 2013-02-28 ENCOUNTER — Other Ambulatory Visit (HOSPITAL_BASED_OUTPATIENT_CLINIC_OR_DEPARTMENT_OTHER): Payer: Medicare Other | Admitting: Lab

## 2013-02-28 ENCOUNTER — Encounter: Payer: Self-pay | Admitting: Physician Assistant

## 2013-02-28 ENCOUNTER — Ambulatory Visit (HOSPITAL_BASED_OUTPATIENT_CLINIC_OR_DEPARTMENT_OTHER): Payer: Medicare Other | Admitting: Physician Assistant

## 2013-02-28 VITALS — BP 100/61 | HR 118 | Temp 98.2°F | Resp 20 | Ht 63.75 in | Wt 161.3 lb

## 2013-02-28 DIAGNOSIS — C50319 Malignant neoplasm of lower-inner quadrant of unspecified female breast: Secondary | ICD-10-CM

## 2013-02-28 DIAGNOSIS — C50312 Malignant neoplasm of lower-inner quadrant of left female breast: Secondary | ICD-10-CM

## 2013-02-28 LAB — CBC WITH DIFFERENTIAL/PLATELET
Eosinophils Absolute: 0.1 10*3/uL (ref 0.0–0.5)
HCT: 30.1 % — ABNORMAL LOW (ref 34.8–46.6)
LYMPH%: 34.8 % (ref 14.0–49.7)
MONO#: 0.9 10*3/uL (ref 0.1–0.9)
NEUT#: 1.3 10*3/uL — ABNORMAL LOW (ref 1.5–6.5)
NEUT%: 37.3 % — ABNORMAL LOW (ref 38.4–76.8)
Platelets: 236 10*3/uL (ref 145–400)
WBC: 3.5 10*3/uL — ABNORMAL LOW (ref 3.9–10.3)

## 2013-02-28 NOTE — Progress Notes (Signed)
ID: Cynthia Griffin   DOB: September 05, 1946  MR#: [id number]  HYQ#:[id number]  PCP: Cynthia Reichmann, MD GYN:  SUAlmond Griffin OTHER MD: Cynthia Griffin, Cynthia Griffin, Cynthia Griffin  ALERT: Another patient with the same date of birth, middle initial, and also with a history of breast cancer is in actinic. Please make sure when dealing with Cynthia Griffin that you have the correct medical record number  HISTORY OF PRESENT ILLNESS: The patient tells me she had an unremarkable screening mammogram January of 2013 in [place]. I do not have that report. Shortly after that, February of 2013, she noted that her left nipple had become inverted. She initially thought this was due to the mammogram itself, but eventually brought it to her primary care physician's attention. This was followed and there was no apparent change when she was next seen December 2013. The patient however became concerned and decided to go back to [facility] for her annual mammography This is where she had her annual mammograms until the last couple of years. Bilateral diagnostic mammography and left ultrasonography there 10/24/2012 showed definite nipple deformity with inversion and retraction. There was subtle architectural distortion and increase in density relative to the preceding study. Left breast ultrasonography in addition to the nipple retraction and inversion showed an area of hyperechoic architectural distortion measuring 1.4 cm in diameter with Doppler flow. Breast specific gamma imaging was obtained February 2024 and confirmed an area of moderate intensity isotope activity following a radial distribution from the left nipple measuring at least 4 cm. 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  Bilateral breast MRI was obtained 10/31/2012 and  showed an area of diffuse non-masslike asymmetric enhancement in the inferior aspect of the left breast measuring up to 7.4 cm transversely. There were no enlarged axillary or internal mammary nodes noted and the right breast was unremarkable. The patient's subsequent history is as detailed below.  INTERVAL HISTORY: Cynthia Griffin returns today  for followup of her left breast carcinoma. She is currently day 8 cycle 4 of 6 planned q. three-week doses of docetaxel/doxorubicin/cyclophosphamide being given in the adjuvant setting. She receives Neulasta on day 2 for granulocyte support.  Cynthia Griffin is recovering well from cycle 4. She does tire easily. She continues to have some intermittent numbness and tingling in her fingers and toes, but notes that this has not changed or increased since her treatment one week ago. It is not affecting any of her day-to-day activities or fine motor skills.   REVIEW OF SYSTEMS:  Cynthia Griffin denies any fevers or chills.  She's had no rashes, skin changes, abnormal bruising or bleeding. She's had no nausea or emesis. She has several episodes of diarrhea this morning, but has had no abdominal pain denies any blood or mucus in the stool. She's had no increased cough, phlegm production, shortness of breath, pleurisy,  chest pain or palpitations. She had a headache one day last week, but this has not recurred, and she denies any changes in vision or dizziness. She has begun to have some slight swelling in the left upper extremity, primarily in the forearm, and has been referred to the lymphedema clinic.  She denies any current myalgias, arthralgias, or bony pain.   A detailed review of systems is otherwise stable and noncontributory.   PAST MEDICAL HISTORY: Past Medical History  Diagnosis Date  .  Breast cancer   . Arthritis   . Anxiety   . Carpal tunnel syndrome of right wrist   . Family history of anesthesia complication     pneumothorax - post op 1980's    PAST SURGICAL HISTORY: Past  Surgical History  Procedure Laterality Date  . Abdominal hysterectomy    . Appendectomy    . Tonsillectomy    . Meniscus repair      Right Knee  . Mastectomy w/ sentinel node biopsy Left 11/14/2012    Procedure: MASTECTOMY WITH SENTINEL LYMPH NODE BIOPSY;  Surgeon: Cynthia Lint, MD;  Location: [facility];  Service: General;  Laterality: Left;  . Axillary lymph node dissection Left 12/01/2012    Procedure: AXILLARY LYMPH NODE DISSECTION;  Surgeon: Cynthia Lint, MD;  Location: [facility];  Service: General;  Laterality: Left;  . Portacath placement Right 12/01/2012    Procedure: INSERTION PORT-A-CATH;  Surgeon: Cynthia Lint, MD;  Location:  SURGERY CENTER;  Service: General;  Laterality: Right;   right shoulder rotator cuff repair, right hand carpal tunnel syndrome, bilateral salpingo--oophorectomy with her hysterectomy in the 1990s, Lasix surgery, right toe surgery  FAMILY HISTORY Family History  Problem Relation Age of Onset  . Bladder Cancer Father 23  . Lung cancer Father 30  . Bone cancer Paternal Uncle     ? late 81s   the patient's father died at the age of 6. He was diagnosed with lung cancer the age of 71 and with bladder cancer at the age of 82. He was a smoker. The patient's mother is living at age 58. She has a history of basal cell carcinoma. Cynthia Griffin had no brothers, one sister. There is no history of breast or ovarian cancer in the immediate family.  GYNECOLOGIC HISTORY: Menarche age 35, first live birth age 59, she is GX P2. She had her hysterectomy she thinks in 1992, and took hormone replacement for approximately 18 years. In addition she took birth control pills between 1968 and 1977 stopping only to conceive.  SOCIAL HISTORY: She is a retired Environmental health practitioner. Her husband Cynthia Griffin (goes by Cynthia Griffin) is in Airline pilot for at [employer] in [place]. Son Cynthia Griffin lives in [place] and works in Chief Financial Officer. Son Cynthia Griffin lives in [place] and works in Airline pilot. The patient has 5 grandchildren" one on the way".   ADVANCED DIRECTIVES: Not in place  HEALTH MAINTENANCE: History  Substance Use Topics  . Smoking status: Former Smoker -- 1.25 packs/day for 29 years    Types: Cigarettes    Quit date: 09/07/1992  . Smokeless tobacco: Never Used  . Alcohol Use: 4.2 oz/week    7 Glasses of wine per week     Colonoscopy: August 2003  PAP: Status post hysterectomy  Bone density: Never  Lipid panel:  Allergies  Allergen Reactions  . Sulfa Antibiotics Nausea Only and Other (See Comments)    Stomach cramps    Current Outpatient Prescriptions  Medication Sig Dispense Refill  . Alum & Mag Hydroxide-Simeth (MAGIC MOUTHWASH W/LIDOCAINE) SOLN Take 5 mLs by mouth 4 (four) times daily as needed (mouth pain and ulcerations).  240 mL  1  . Biotin 5000 MCG CAPS Take 5,000 mcg by mouth daily.      . calcium citrate-vitamin D (CITRACAL+D) 315-200 MG-UNIT per tablet Take 1 tablet by mouth daily.      . citalopram (CELEXA) 40 MG tablet Take 40 mg by mouth daily.      Marland Kitchen  cyclobenzaprine (FLEXERIL) 5 MG tablet Take 1 tablet (5 mg total) by mouth 3 (three) times daily as needed for muscle spasms.  30 tablet  0  . dexamethasone (DECADRON) 4 MG tablet Take 1 tablet (4 mg total) by mouth 2 (two) times daily with a meal.  30 tablet  3  . docusate sodium (COLACE) 100 MG capsule Take 100 mg by mouth 2 (two) times daily as needed for constipation.      . Glucosamine-Chondroit-Vit C-Mn (GLUCOSAMINE CHONDR 1500 COMPLX PO) Take 1 capsule by mouth daily.      Marland Kitchen HYDROcodone-acetaminophen (NORCO/VICODIN) 5-325 MG per tablet       . lidocaine-prilocaine (EMLA) cream Apply topically as needed. Apply over port area 1-2 hours before chemo, cover with plastic wrap  30 g  3  . LORazepam (ATIVAN) 0.5 MG tablet TAKE 1 TABLET BY MOUTH AT BEDTIME FOR ANXIETY  30 tablet  0  . Multiple Vitamin (MULTIVITAMIN WITH MINERALS) TABS Take 1 tablet by  mouth daily.      . polyethylene glycol (MIRALAX / GLYCOLAX) packet Take 17 g by mouth daily as needed.      . prochlorperazine (COMPAZINE) 10 MG tablet Take 1 tablet (10 mg total) by mouth every 6 (six) hours as needed.  30 tablet  3  . prochlorperazine (COMPAZINE) 25 MG suppository Place 1 suppository (25 mg total) rectally every 12 (twelve) hours as needed for nausea.  12 suppository  0  . ranitidine (ZANTAC) 150 MG tablet Take 1 tablet (150 mg total) by mouth 2 (two) times daily.  60 tablet  5  . tobramycin-dexamethasone (TOBRADEX) ophthalmic solution Place 1 drop into both eyes 2 (two) times daily at 10 AM and 5 PM.  5 mL  0   No current facility-administered medications for this visit.    OBJECTIVE: Middle-aged white woman in no acute distress Filed Vitals:   02/28/13 1321  BP: 100/61  Pulse: 118  Temp: 98.2 F (36.8 C)  Resp: 20     Body mass index is 27.91 kg/(m^2).    ECOG FS: 1 Filed Weights   02/28/13 1321  Weight: 161 lb 4.8 oz (73.165 kg)   Sclerae unicteric Oropharynx is notable for 2 small ulcerations on the tip of the tongue. No evidence of thrush. No cervical or supraclavicular adenopathy Lungs clear to auscultation bilaterally, no crackles, wheezes or rhonchi Heart regular rate and rhythm Abdomen soft, nontender to palpation, positive bowel sounds MSK no focal spinal tenderness to palpation  Nonpitting edema noted in the left upper extremity. Otherwise, no peripheral edema Neuro: nonfocal, well oriented, friendly affect Breasts: Exam deferred today. Axillae are benign bilaterally with no palpable adenopathy. Port is intact in the right upper chest wall, no erythema, edema, or evidence of infection.   LAB RESULTS: Lab Results  Component Value Date   WBC 3.5* 02/28/2013   NEUTROABS 1.3* 02/28/2013   HGB 10.1* 02/28/2013   HCT 30.1* 02/28/2013   MCV 88.5 02/28/2013   PLT 236 02/28/2013   CMP     Component Value Date/Time   NA 139 02/21/2013 1028   NA 139  12/03/2012 0155   K 3.8 02/21/2013 1028   K 3.8 12/03/2012 0155   CL 107 02/21/2013 1028   CL 104 12/03/2012 0155   CO2 23 02/21/2013 1028   CO2 28 12/03/2012 0155   GLUCOSE 97 02/21/2013 1028   GLUCOSE 114* 12/03/2012 0155   BUN 15.7 02/21/2013 1028   BUN 10 12/03/2012 0155  CREATININE 0.7 02/21/2013 1028   CREATININE 0.69 12/03/2012 0155   CREATININE 0.68 12/03/2012 0155   CALCIUM 9.4 02/21/2013 1028   CALCIUM 8.3* 12/03/2012 0155   PROT 6.8 02/21/2013 1028   ALBUMIN 3.3* 02/21/2013 1028   AST 15 02/21/2013 1028   ALT 23 02/21/2013 1028   ALKPHOS 76 02/21/2013 1028   BILITOT 0.30 02/21/2013 1028   GFRNONAA 89* 12/03/2012 0155   GFRNONAA 90* 12/03/2012 0155   GFRAA >90 12/03/2012 0155   GFRAA >90 12/03/2012 0155       STUDIES:      Echocardiogram on 12/19/2012 showed an ejection fraction of 60-65%.   ASSESSMENT: 67 y.o. [place] woman   (1)  status post left mastectomy and axillary lymph node dissection March 2014 for a pT3 pN2, stage IIIA invasive lobular carcinoma, grade 1, estrogen receptor 100% positive, progesterone receptor and HER-2 negative, with an MIB-1 of 8%.  (2) R pneumothorax with port placement March 2014- resolved  (3)  being treated in the adjuvant setting with docetaxel/doxorubicin/cyclophosphamide, the plan being to complete 6 q. three-week doses, with Neulasta given on day 2 for granulocyte support  (4)  GERD, likely attributed to oral dexamethasone, improved with ranitidine  (5)  lymphedema, left upper extremity   PLAN:  Saidee will return in 2 weeks on July 8 for followup in anticipation of her fifth cycle of adjuvant chemotherapy. Thus far, the neuropathy appears to be stable, but we will follow this very closely with each visit.  She voices understanding and agreement with this plan, and will call with any changes or problems.    Cynthia Griffin    02/28/2013

## 2013-03-14 ENCOUNTER — Other Ambulatory Visit: Payer: BC Managed Care – PPO | Admitting: Lab

## 2013-03-14 ENCOUNTER — Ambulatory Visit (HOSPITAL_BASED_OUTPATIENT_CLINIC_OR_DEPARTMENT_OTHER): Payer: Medicare Other | Admitting: Oncology

## 2013-03-14 ENCOUNTER — Other Ambulatory Visit (HOSPITAL_BASED_OUTPATIENT_CLINIC_OR_DEPARTMENT_OTHER): Payer: Medicare Other | Admitting: Lab

## 2013-03-14 ENCOUNTER — Ambulatory Visit (HOSPITAL_BASED_OUTPATIENT_CLINIC_OR_DEPARTMENT_OTHER): Payer: Medicare Other

## 2013-03-14 ENCOUNTER — Ambulatory Visit: Payer: Medicare Other | Admitting: Physical Therapy

## 2013-03-14 VITALS — BP 142/71 | HR 105 | Temp 98.1°F | Resp 20 | Ht 63.75 in | Wt 165.1 lb

## 2013-03-14 DIAGNOSIS — C50319 Malignant neoplasm of lower-inner quadrant of unspecified female breast: Secondary | ICD-10-CM

## 2013-03-14 DIAGNOSIS — C50919 Malignant neoplasm of unspecified site of unspecified female breast: Secondary | ICD-10-CM

## 2013-03-14 DIAGNOSIS — C50312 Malignant neoplasm of lower-inner quadrant of left female breast: Secondary | ICD-10-CM

## 2013-03-14 DIAGNOSIS — I890 Lymphedema, not elsewhere classified: Secondary | ICD-10-CM | POA: Diagnosis not present

## 2013-03-14 DIAGNOSIS — G609 Hereditary and idiopathic neuropathy, unspecified: Secondary | ICD-10-CM | POA: Diagnosis not present

## 2013-03-14 DIAGNOSIS — Z5111 Encounter for antineoplastic chemotherapy: Secondary | ICD-10-CM

## 2013-03-14 DIAGNOSIS — Z170 Estrogen receptor positive status [ER+]: Secondary | ICD-10-CM

## 2013-03-14 LAB — CBC WITH DIFFERENTIAL/PLATELET
BASO%: 0.2 % (ref 0.0–2.0)
EOS%: 0 % (ref 0.0–7.0)
HCT: 33.2 % — ABNORMAL LOW (ref 34.8–46.6)
LYMPH%: 17.2 % (ref 14.0–49.7)
MCH: 28.8 pg (ref 25.1–34.0)
MCHC: 31.6 g/dL (ref 31.5–36.0)
NEUT%: 78.8 % — ABNORMAL HIGH (ref 38.4–76.8)
Platelets: 452 10*3/uL — ABNORMAL HIGH (ref 145–400)

## 2013-03-14 MED ORDER — SODIUM CHLORIDE 0.9 % IV SOLN
598.5000 mg | Freq: Once | INTRAVENOUS | Status: AC
  Administered 2013-03-14: 600 mg via INTRAVENOUS
  Filled 2013-03-14: qty 60

## 2013-03-14 MED ORDER — SODIUM CHLORIDE 0.9 % IJ SOLN
10.0000 mL | INTRAMUSCULAR | Status: DC | PRN
  Administered 2013-03-14: 10 mL
  Filled 2013-03-14: qty 10

## 2013-03-14 MED ORDER — ONDANSETRON 16 MG/50ML IVPB (CHCC)
16.0000 mg | Freq: Once | INTRAVENOUS | Status: AC
  Administered 2013-03-14: 16 mg via INTRAVENOUS

## 2013-03-14 MED ORDER — SODIUM CHLORIDE 0.9 % IV SOLN
Freq: Once | INTRAVENOUS | Status: AC
  Administered 2013-03-14: 10:00:00 via INTRAVENOUS

## 2013-03-14 MED ORDER — HEPARIN SOD (PORK) LOCK FLUSH 100 UNIT/ML IV SOLN
500.0000 [IU] | Freq: Once | INTRAVENOUS | Status: AC | PRN
  Administered 2013-03-14: 500 [IU]
  Filled 2013-03-14: qty 5

## 2013-03-14 MED ORDER — ONDANSETRON HCL 8 MG PO TABS: 8.0000 mg | ORAL_TABLET | Freq: Two times a day (BID) | ORAL | Status: DC

## 2013-03-14 MED ORDER — SODIUM CHLORIDE 0.9 % IV SOLN
800.0000 mg/m2 | Freq: Once | INTRAVENOUS | Status: AC
  Administered 2013-03-14: 1482 mg via INTRAVENOUS
  Filled 2013-03-14: qty 39

## 2013-03-14 MED ORDER — HYDROCODONE-ACETAMINOPHEN 5-325 MG PO TABS: 1.0000 | ORAL_TABLET | Freq: Four times a day (QID) | ORAL | Status: DC | PRN

## 2013-03-14 MED ORDER — DEXAMETHASONE SODIUM PHOSPHATE 20 MG/5ML IJ SOLN
20.0000 mg | Freq: Once | INTRAMUSCULAR | Status: AC
  Administered 2013-03-14: 20 mg via INTRAVENOUS

## 2013-03-14 NOTE — Progress Notes (Signed)
ID: Kateryna Grantham   DOB: 1945/12/11  MR#: [id number]  GUY#:[id number]  PCP: Barbette Reichmann, MD GYN:  SUAlmond Lint OTHER MD: Dorothy Puffer, Jeralyn Ruths, Etter Sjogren  ALERT: Another patient with the same date of birth, middle initial, and also with a history of breast cancer is in actinic. Please make sure when dealing with Marcea L. Masse that you have the correct medical record number  HISTORY OF PRESENT ILLNESS: The patient tells me she had an unremarkable screening mammogram January of 2013 in [place]. I do not have that report. Shortly after that, February of 2013, she noted that her left nipple had become inverted. She initially thought this was due to the mammogram itself, but eventually brought it to her primary care physician's attention. This was followed and there was no apparent change when she was next seen December 2013. The patient however became concerned and decided to go back to [facility] for her annual mammography This is where she had her annual mammograms until the last couple of years. Bilateral diagnostic mammography and left ultrasonography there 10/24/2012 showed definite nipple deformity with inversion and retraction. There was subtle architectural distortion and increase in density relative to the preceding study. Left breast ultrasonography in addition to the nipple retraction and inversion showed an area of hyperechoic architectural distortion measuring 1.4 cm in diameter with Doppler flow. Breast specific gamma imaging was obtained February 2024 and confirmed an area of moderate intensity isotope activity following a radial distribution from the left nipple measuring at least 4 cm. 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  Bilateral breast MRI was obtained 10/31/2012 and  showed an area of diffuse non-masslike asymmetric enhancement in the inferior aspect of the left breast measuring up to 7.4 cm transversely. There were no enlarged axillary or internal mammary nodes noted and the right breast was unremarkable. The patient's subsequent history is as detailed below.  INTERVAL HISTORY: Jaycee returns today  for followup of her left breast carcinoma. She is currently day 1 cycle 5 of 6 planned q. three-week doses of docetaxel/ doxorubicin/ cyclophosphamide being given in the adjuvant setting. However, we are changing her treatment plan today.  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 It particularly affects her right hand and her left toes and the ball of her left foot. Again she is able to walk well, is not woken up at night with pain or tingling of feelings, can button her shirt and so on. The problem is that this is not fading between treatments I think it's likely to become worse if we don't change her therapy now. Aside from the neuropathy issues she describes herself as mildly fatigued. She gets significant pain after the Neulasta particularly involving her knees but also other parts of her body and she can only moderately controlled with nonsteroidals. She has run out of the Vicodin. She has a little bit of lymphedema in the left arm, which she notices primarily when she looks at her wrist. It is not actually noticeable in her hands or arms as described in her physical exam today. She gets constipated for 3 days after chemotherapy then has loose bowel movements for another 3 days otherwise a detailed review of systems today was stable.   PAST MEDICAL HISTORY: Past Medical History  Diagnosis  Date  . Breast cancer   . Arthritis   . Anxiety   . Carpal tunnel syndrome of right wrist   . Family history of anesthesia complication     pneumothorax - post op 1980's    PAST SURGICAL HISTORY: Past Surgical History   Procedure Laterality Date  . Abdominal hysterectomy    . Appendectomy    . Tonsillectomy    . Meniscus repair      Right Knee  . Mastectomy w/ sentinel node biopsy Left 11/14/2012    Procedure: MASTECTOMY WITH SENTINEL LYMPH NODE BIOPSY;  Surgeon: Almond Lint, MD;  Location: [facility];  Service: General;  Laterality: Left;  . Axillary lymph node dissection Left 12/01/2012    Procedure: AXILLARY LYMPH NODE DISSECTION;  Surgeon: Almond Lint, MD;  Location: [facility];  Service: General;  Laterality: Left;  . Portacath placement Right 12/01/2012    Procedure: INSERTION PORT-A-CATH;  Surgeon: Almond Lint, MD;  Location: [facility];  Service: General;  Laterality: Right;   right shoulder rotator cuff repair, right hand carpal tunnel syndrome, bilateral salpingo--oophorectomy with her hysterectomy in the 1990s, Lasix surgery, right toe surgery  FAMILY HISTORY Family History  Problem Relation Age of Onset  . Bladder Cancer Father 92  . Lung cancer Father 26  . Bone cancer Paternal Uncle     ? late 37s   the patient's father died at the age of 37. He was diagnosed with lung cancer the age of 41 and with bladder cancer at the age of 34. He was a smoker. The patient's mother is living at age 80. She has a history of basal cell carcinoma. Raihana had no brothers, one sister. There is no history of breast or ovarian cancer in the immediate family.  GYNECOLOGIC HISTORY: Menarche age 58, first live birth age 16, she is GX P2. She had her hysterectomy she thinks in 1992, and took hormone replacement for approximately 18 years. In addition she took birth control pills between 1968 and 1977 stopping only to conceive.  SOCIAL HISTORY: She is a retired Environmental health practitioner. Her husband Rinnah Peppel (goes by Fredrik Cove) is in Airline pilot for at [employer] in [place]. Son Roslyn Else lives in [place] and works in Chief Financial Officer. Son R.Melaina Howerton II lives  in [place] and works in Airline pilot. The patient has 5 grandchildren" one on the way".   ADVANCED DIRECTIVES: Not in place  HEALTH MAINTENANCE: History  Substance Use Topics  . Smoking status: Former Smoker -- 1.25 packs/day for 29 years    Types: Cigarettes    Quit date: 09/07/1992  . Smokeless tobacco: Never Used  . Alcohol Use: 4.2 oz/week    7 Glasses of wine per week     Colonoscopy: August 2003  PAP: Status post hysterectomy  Bone density: Never  Lipid panel:  Allergies  Allergen Reactions  . Sulfa Antibiotics Nausea Only and Other (See Comments)    Stomach cramps    Current Outpatient Prescriptions  Medication Sig Dispense Refill  . Alum & Mag Hydroxide-Simeth (MAGIC MOUTHWASH W/LIDOCAINE) SOLN Take 5 mLs by mouth 4 (four) times daily as needed (mouth pain and ulcerations).  240 mL  1  . Biotin 5000 MCG CAPS Take 5,000 mcg by mouth daily.      . calcium citrate-vitamin D (CITRACAL+D) 315-200 MG-UNIT per tablet Take 1 tablet by mouth daily.      . citalopram (CELEXA) 40 MG tablet Take 40 mg by mouth daily.      Marland Kitchen  cyclobenzaprine (FLEXERIL) 5 MG tablet Take 1 tablet (5 mg total) by mouth 3 (three) times daily as needed for muscle spasms.  30 tablet  0  . dexamethasone (DECADRON) 4 MG tablet Take 1 tablet (4 mg total) by mouth 2 (two) times daily with a meal.  30 tablet  3  . docusate sodium (COLACE) 100 MG capsule Take 100 mg by mouth 2 (two) times daily as needed for constipation.      . Glucosamine-Chondroit-Vit C-Mn (GLUCOSAMINE CHONDR 1500 COMPLX PO) Take 1 capsule by mouth daily.      Marland Kitchen HYDROcodone-acetaminophen (NORCO/VICODIN) 5-325 MG per tablet       . lidocaine-prilocaine (EMLA) cream Apply topically as needed. Apply over port area 1-2 hours before chemo, cover with plastic wrap  30 g  3  . LORazepam (ATIVAN) 0.5 MG tablet TAKE 1 TABLET BY MOUTH AT BEDTIME FOR ANXIETY  30 tablet  0  . Multiple Vitamin (MULTIVITAMIN WITH MINERALS) TABS Take 1 tablet by mouth daily.       . polyethylene glycol (MIRALAX / GLYCOLAX) packet Take 17 g by mouth daily as needed.      . prochlorperazine (COMPAZINE) 10 MG tablet Take 1 tablet (10 mg total) by mouth every 6 (six) hours as needed.  30 tablet  3  . prochlorperazine (COMPAZINE) 25 MG suppository Place 1 suppository (25 mg total) rectally every 12 (twelve) hours as needed for nausea.  12 suppository  0  . ranitidine (ZANTAC) 150 MG tablet Take 1 tablet (150 mg total) by mouth 2 (two) times daily.  60 tablet  5  . tobramycin-dexamethasone (TOBRADEX) ophthalmic solution Place 1 drop into both eyes 2 (two) times daily at 10 AM and 5 PM.  5 mL  0   No current facility-administered medications for this visit.    OBJECTIVE: Middle-aged white woman in no acute distress Filed Vitals:   03/14/13 0848  BP: 142/71  Pulse: 105  Temp: 98.1 F (36.7 C)  Resp: 20     Body mass index is 28.57 kg/(m^2).    ECOG FS: 1 Filed Weights   03/14/13 0848  Weight: 165 lb 1.6 oz (74.889 kg)   Sclerae unicteric Oropharynx clear No cervical or supraclavicular adenopathy Lungs clear to auscultation bilaterally, no crackles, wheezes or rhonchi Heart regular rate and rhythm Abdomen soft, nontender to palpation, positive bowel sounds MSK no focal spinal tenderness  I really do not detect lymphedema of the left upper extremity looking at the hand or the arm. The left wrist seems to be slightly thicker than the right. Neuro: nonfocal, well oriented, friendly affect Breasts: The right breast is unremarkable. The left breast is status post mastectomy. There is no evidence of local recurrence. The left axilla is benign Port is intact in the right upper chest wall  LAB RESULTS: Lab Results  Component Value Date   WBC 6.4 03/14/2013   NEUTROABS 5.0 03/14/2013   HGB 10.5* 03/14/2013   HCT 33.2* 03/14/2013   MCV 91.0 03/14/2013   PLT 452* 03/14/2013   CMP     Component Value Date/Time   NA 139 02/21/2013 1028   NA 139 12/03/2012 0155   K 3.8 02/21/2013  1028   K 3.8 12/03/2012 0155   CL 107 02/21/2013 1028   CL 104 12/03/2012 0155   CO2 23 02/21/2013 1028   CO2 28 12/03/2012 0155   GLUCOSE 97 02/21/2013 1028   GLUCOSE 114* 12/03/2012 0155   BUN 15.7 02/21/2013 1028   BUN  10 12/03/2012 0155   CREATININE 0.7 02/21/2013 1028   CREATININE 0.69 12/03/2012 0155   CREATININE 0.68 12/03/2012 0155   CALCIUM 9.4 02/21/2013 1028   CALCIUM 8.3* 12/03/2012 0155   PROT 6.8 02/21/2013 1028   ALBUMIN 3.3* 02/21/2013 1028   AST 15 02/21/2013 1028   ALT 23 02/21/2013 1028   ALKPHOS 76 02/21/2013 1028   BILITOT 0.30 02/21/2013 1028   GFRNONAA 89* 12/03/2012 0155   GFRNONAA 90* 12/03/2012 0155   GFRAA >90 12/03/2012 0155   GFRAA >90 12/03/2012 0155       STUDIES:  No results found.   ASSESSMENT: 67 y.o. [place] woman   (1)  status post left mastectomy and axillary lymph node dissection March 2014 for a pT3 pN2, stage IIIA invasive lobular carcinoma, grade 1, estrogen receptor 100% positive, progesterone receptor and HER-2 negative, with an MIB-1 of 8%.  (2) R pneumothorax with port placement March 2014- resolved  (3)  treated in the adjuvant setting with docetaxel/ doxorubicin/ cyclophosphamide, the original plan being to complete 6 q. three-week doses, with Neulasta given on day 2 for granulocyte support  (4) switched to carboplatin and gemcitabine given day 1 only 4 cycles 5 and 6 of chemotherapy because of concerns regarding developing neuropathy  (5)  lymphedema, left upper extremity  (6) reconstruction to follow after the patient completes and has receovered from her radiation therapy   PLAN:  We discussed her situation in detail and I really would not want her to develop any more neuropathy than she has the concern of course is that it is not clearing in between treatments. Accordingly we are going to do carboplatin and gemcitabine for the last 2 doses. She will receive cycle 5 today and cycle 6 in 3 weeks. We are now doing a day 8 treatment. I have  added ondansetron to her antinausea medicine and reminded her how to take her dexamethasone (which she no longer needs to take on the day before chemotherapy) and Compazine. We'll so discuss how to deal with her constipation/diarrhea problem. Finally I wrote her Norco to help her with Neulasta.  She is already scheduled to meet with the lymphedema clinic. I believe she would benefit from a left upper extremity compression sleeve. Associates done with chemotherapy she will start her radiation treatments. 6 months later she hopes to start her reconstruction.    Imaya Duffy C    03/14/2013

## 2013-03-14 NOTE — Patient Instructions (Signed)
[facility] Discharge Instructions for Patients Receiving Chemotherapy  Today you received the following chemotherapy agents: gemzar, carboplatin   To help prevent nausea and vomiting after your treatment, we encourage you to take your nausea medication.  Take it as often as prescribed.     If you develop nausea and vomiting that is not controlled by your nausea medication, call the clinic. If it is after clinic hours your family physician or the after hours number for the clinic or go to the Emergency Department.   BELOW ARE SYMPTOMS THAT SHOULD BE REPORTED IMMEDIATELY:  *FEVER GREATER THAN 100.5 F  *CHILLS WITH OR WITHOUT FEVER  NAUSEA AND VOMITING THAT IS NOT CONTROLLED WITH YOUR NAUSEA MEDICATION  *UNUSUAL SHORTNESS OF BREATH  *UNUSUAL BRUISING OR BLEEDING  TENDERNESS IN MOUTH AND THROAT WITH OR WITHOUT PRESENCE OF ULCERS  *URINARY PROBLEMS  *BOWEL PROBLEMS  UNUSUAL RASH Items with * indicate a potential emergency and should be followed up as soon as possible.  Feel free to call the clinic you have any questions or concerns. The clinic phone number is [phone].   I have been informed and understand all the instructions given to me. I know to contact the clinic, my physician, or go to the Emergency Department if any problems should occur. I do not have any questions at this time, but understand that I may call the clinic during office hours   should I have any questions or need assistance in obtaining follow up care.    __________________________________________  _____________  __________ Signature of Patient or Authorized Representative            Date                   Time    __________________________________________ Nurse's Signature   Carboplatin injection What is this medicine? CARBOPLATIN (KAR boe pla tin) is a chemotherapy drug. It targets fast dividing cells, like cancer cells, and causes these cells to die. This medicine is used to  treat ovarian cancer and many other cancers. This medicine may be used for other purposes; ask your health care provider or pharmacist if you have questions. What should I tell my health care provider before I take this medicine? They need to know if you have any of these conditions: -blood disorders -hearing problems -kidney disease -recent or ongoing radiation therapy -an unusual or allergic reaction to carboplatin, cisplatin, other chemotherapy, other medicines, foods, dyes, or preservatives -pregnant or trying to get pregnant -breast-feeding How should I use this medicine? This drug is usually given as an infusion into a vein. It is administered in a hospital or clinic by a specially trained health care professional. Talk to your pediatrician regarding the use of this medicine in children. Special care may be needed. Overdosage: If you think you have taken too much of this medicine contact a poison control center or emergency room at once. NOTE: This medicine is only for you. Do not share this medicine with others. What if I miss a dose? It is important not to miss a dose. Call your doctor or health care professional if you are unable to keep an appointment. What may interact with this medicine? -medicines for seizures -medicines to increase blood counts like filgrastim, pegfilgrastim, sargramostim -some antibiotics like amikacin, gentamicin, neomycin, streptomycin, tobramycin -vaccines Talk to your doctor or health care professional before taking any of these medicines: -acetaminophen -aspirin -ibuprofen -ketoprofen -naproxen This list may not describe all possible interactions. Give   your health care provider a list of all the medicines, herbs, non-prescription drugs, or dietary supplements you use. Also tell them if you smoke, drink alcohol, or use illegal drugs. Some items may interact with your medicine. What should I watch for while using this medicine? Your condition will be  monitored carefully while you are receiving this medicine. You will need important blood work done while you are taking this medicine. This drug may make you feel generally unwell. This is not uncommon, as chemotherapy can affect healthy cells as well as cancer cells. Report any side effects. Continue your course of treatment even though you feel ill unless your doctor tells you to stop. In some cases, you may be given additional medicines to help with side effects. Follow all directions for their use. Call your doctor or health care professional for advice if you get a fever, chills or sore throat, or other symptoms of a cold or flu. Do not treat yourself. This drug decreases your body's ability to fight infections. Try to avoid being around people who are sick. This medicine may increase your risk to bruise or bleed. Call your doctor or health care professional if you notice any unusual bleeding. Be careful brushing and flossing your teeth or using a toothpick because you may get an infection or bleed more easily. If you have any dental work done, tell your dentist you are receiving this medicine. Avoid taking products that contain aspirin, acetaminophen, ibuprofen, naproxen, or ketoprofen unless instructed by your doctor. These medicines may hide a fever. Do not become pregnant while taking this medicine. Women should inform their doctor if they wish to become pregnant or think they might be pregnant. There is a potential for serious side effects to an unborn child. Talk to your health care professional or pharmacist for more information. Do not breast-feed an infant while taking this medicine. What side effects may I notice from receiving this medicine? Side effects that you should report to your doctor or health care professional as soon as possible: -allergic reactions like skin rash, itching or hives, swelling of the face, lips, or tongue -signs of infection - fever or chills, cough, sore throat,  pain or difficulty passing urine -signs of decreased platelets or bleeding - bruising, pinpoint red spots on the skin, black, tarry stools, nosebleeds -signs of decreased red blood cells - unusually weak or tired, fainting spells, lightheadedness -breathing problems -changes in hearing -changes in vision -chest pain -high blood pressure -low blood counts - This drug may decrease the number of white blood cells, red blood cells and platelets. You may be at increased risk for infections and bleeding. -nausea and vomiting -pain, swelling, redness or irritation at the injection site -pain, tingling, numbness in the hands or feet -problems with balance, talking, walking -trouble passing urine or change in the amount of urine Side effects that usually do not require medical attention (report to your doctor or health care professional if they continue or are bothersome): -hair loss -loss of appetite -metallic taste in the mouth or changes in taste This list may not describe all possible side effects. Call your doctor for medical advice about side effects. You may report side effects to FDA at 1-800-FDA-1088. Where should I keep my medicine? This drug is given in a hospital or clinic and will not be stored at home. NOTE: This sheet is a summary. It may not cover all possible information. If you have questions about this medicine, talk to your doctor, pharmacist,   or health care provider.  2012, [employer]. (11/29/2007 2:38:05 PM)  Gemcitabine injection What is this medicine? GEMCITABINE (jem SIT a been) is a chemotherapy drug. This medicine is used to treat many types of cancer like breast cancer, lung cancer, pancreatic cancer, and ovarian cancer. This medicine may be used for other purposes; ask your health care provider or pharmacist if you have questions. What should I tell my health care provider before I take this medicine? They need to know if you have any of these  conditions: -blood disorders -infection -kidney disease -liver disease -recent or ongoing radiation therapy -an unusual or allergic reaction to gemcitabine, other chemotherapy, other medicines, foods, dyes, or preservatives -pregnant or trying to get pregnant -breast-feeding How should I use this medicine? This drug is given as an infusion into a vein. It is administered in a hospital or clinic by a specially trained health care professional. Talk to your pediatrician regarding the use of this medicine in children. Special care may be needed. Overdosage: If you think you have taken too much of this medicine contact a poison control center or emergency room at once. NOTE: This medicine is only for you. Do not share this medicine with others. What if I miss a dose? It is important not to miss your dose. Call your doctor or health care professional if you are unable to keep an appointment. What may interact with this medicine? -medicines to increase blood counts like filgrastim, pegfilgrastim, sargramostim -some other chemotherapy drugs like cisplatin -vaccines Talk to your doctor or health care professional before taking any of these medicines: -acetaminophen -aspirin -ibuprofen -ketoprofen -naproxen This list may not describe all possible interactions. Give your health care provider a list of all the medicines, herbs, non-prescription drugs, or dietary supplements you use. Also tell them if you smoke, drink alcohol, or use illegal drugs. Some items may interact with your medicine. What should I watch for while using this medicine? Visit your doctor for checks on your progress. This drug may make you feel generally unwell. This is not uncommon, as chemotherapy can affect healthy cells as well as cancer cells. Report any side effects. Continue your course of treatment even though you feel ill unless your doctor tells you to stop. In some cases, you may be given additional medicines to help  with side effects. Follow all directions for their use. Call your doctor or health care professional for advice if you get a fever, chills or sore throat, or other symptoms of a cold or flu. Do not treat yourself. This drug decreases your body's ability to fight infections. Try to avoid being around people who are sick. This medicine may increase your risk to bruise or bleed. Call your doctor or health care professional if you notice any unusual bleeding. Be careful brushing and flossing your teeth or using a toothpick because you may get an infection or bleed more easily. If you have any dental work done, tell your dentist you are receiving this medicine. Avoid taking products that contain aspirin, acetaminophen, ibuprofen, naproxen, or ketoprofen unless instructed by your doctor. These medicines may hide a fever. Women should inform their doctor if they wish to become pregnant or think they might be pregnant. There is a potential for serious side effects to an unborn child. Talk to your health care professional or pharmacist for more information. Do not breast-feed an infant while taking this medicine. What side effects may I notice from receiving this medicine? Side effects that you should   report to your doctor or health care professional as soon as possible: -allergic reactions like skin rash, itching or hives, swelling of the face, lips, or tongue -low blood counts - this medicine may decrease the number of white blood cells, red blood cells and platelets. You may be at increased risk for infections and bleeding. -signs of infection - fever or chills, cough, sore throat, pain or difficulty passing urine -signs of decreased platelets or bleeding - bruising, pinpoint red spots on the skin, black, tarry stools, blood in the urine -signs of decreased red blood cells - unusually weak or tired, fainting spells, lightheadedness -breathing problems -chest pain -mouth sores -nausea and vomiting -pain,  swelling, redness at site where injected -pain, tingling, numbness in the hands or feet -stomach pain -swelling of ankles, feet, hands -unusual bleeding Side effects that usually do not require medical attention (report to your doctor or health care professional if they continue or are bothersome): -constipation -diarrhea -hair loss -loss of appetite -stomach upset This list may not describe all possible side effects. Call your doctor for medical advice about side effects. You may report side effects to FDA at 1-800-FDA-1088. Where should I keep my medicine? This drug is given in a hospital or clinic and will not be stored at home. NOTE: This sheet is a summary. It may not cover all possible information. If you have questions about this medicine, talk to your doctor, pharmacist, or health care provider.  2012, [employer]. (01/03/2008 6:45:54 PM) 

## 2013-03-15 ENCOUNTER — Ambulatory Visit (HOSPITAL_BASED_OUTPATIENT_CLINIC_OR_DEPARTMENT_OTHER): Payer: Medicare Other

## 2013-03-15 ENCOUNTER — Telehealth: Payer: Self-pay | Admitting: *Deleted

## 2013-03-15 ENCOUNTER — Ambulatory Visit: Payer: Medicare Other | Attending: Physician Assistant | Admitting: Physical Therapy

## 2013-03-15 ENCOUNTER — Other Ambulatory Visit: Payer: Self-pay | Admitting: Oncology

## 2013-03-15 ENCOUNTER — Ambulatory Visit: Payer: Medicare Other | Admitting: Physical Therapy

## 2013-03-15 VITALS — BP 140/61 | HR 90 | Temp 98.3°F

## 2013-03-15 DIAGNOSIS — Z9010 Acquired absence of unspecified breast and nipple: Secondary | ICD-10-CM | POA: Insufficient documentation

## 2013-03-15 DIAGNOSIS — IMO0001 Reserved for inherently not codable concepts without codable children: Secondary | ICD-10-CM | POA: Diagnosis not present

## 2013-03-15 DIAGNOSIS — C50319 Malignant neoplasm of lower-inner quadrant of unspecified female breast: Secondary | ICD-10-CM

## 2013-03-15 DIAGNOSIS — I890 Lymphedema, not elsewhere classified: Secondary | ICD-10-CM | POA: Diagnosis not present

## 2013-03-15 DIAGNOSIS — C50919 Malignant neoplasm of unspecified site of unspecified female breast: Secondary | ICD-10-CM | POA: Diagnosis not present

## 2013-03-15 MED ORDER — PEGFILGRASTIM INJECTION 6 MG/0.6ML
6.0000 mg | Freq: Once | SUBCUTANEOUS | Status: AC
  Administered 2013-03-15: 6 mg via SUBCUTANEOUS
  Filled 2013-03-15: qty 0.6

## 2013-03-15 NOTE — Telephone Encounter (Signed)
Message copied by Augusto Garbe on Wed Mar 15, 2013  4:00 PM ------      Message from: Lorri Frederick      Created: Tue Mar 14, 2013 10:28 AM      Regarding: New regimen       Gemzar, carboplatin ------

## 2013-03-15 NOTE — Telephone Encounter (Signed)
Patient is doing well with no complaints per injection nurse Liborio Nixon

## 2013-03-16 ENCOUNTER — Encounter: Payer: Self-pay | Admitting: Physician Assistant

## 2013-03-16 ENCOUNTER — Other Ambulatory Visit: Payer: Self-pay

## 2013-03-20 ENCOUNTER — Telehealth: Payer: Self-pay | Admitting: Family Medicine

## 2013-03-20 NOTE — Telephone Encounter (Signed)
Pt is wanting to est w/you as her PCP. She is currently undergoing treatment for Breast Cancer, but has no other issues. Your first available new pt apptmt is not until Feb, 2015 and she would like to have a PCP est prior to that date just in case she needs someone. Can you accommodate her a sooner new pt apptmt? Thank you.

## 2013-03-21 ENCOUNTER — Encounter: Payer: Self-pay | Admitting: Physician Assistant

## 2013-03-21 ENCOUNTER — Ambulatory Visit (HOSPITAL_BASED_OUTPATIENT_CLINIC_OR_DEPARTMENT_OTHER): Payer: Medicare Other | Admitting: Physician Assistant

## 2013-03-21 ENCOUNTER — Other Ambulatory Visit (HOSPITAL_BASED_OUTPATIENT_CLINIC_OR_DEPARTMENT_OTHER): Payer: Medicare Other | Admitting: Lab

## 2013-03-21 VITALS — BP 115/58 | HR 102 | Temp 98.7°F | Resp 20 | Ht 63.75 in | Wt 165.5 lb

## 2013-03-21 DIAGNOSIS — C50319 Malignant neoplasm of lower-inner quadrant of unspecified female breast: Secondary | ICD-10-CM

## 2013-03-21 DIAGNOSIS — I890 Lymphedema, not elsewhere classified: Secondary | ICD-10-CM

## 2013-03-21 DIAGNOSIS — T451X5A Adverse effect of antineoplastic and immunosuppressive drugs, initial encounter: Secondary | ICD-10-CM

## 2013-03-21 DIAGNOSIS — D6481 Anemia due to antineoplastic chemotherapy: Secondary | ICD-10-CM | POA: Diagnosis not present

## 2013-03-21 DIAGNOSIS — C50312 Malignant neoplasm of lower-inner quadrant of left female breast: Secondary | ICD-10-CM

## 2013-03-21 LAB — CBC WITH DIFFERENTIAL/PLATELET
Basophils Absolute: 0.1 10*3/uL (ref 0.0–0.1)
Eosinophils Absolute: 0.2 10*3/uL (ref 0.0–0.5)
HGB: 9.3 g/dL — ABNORMAL LOW (ref 11.6–15.9)
MCV: 89.1 fL (ref 79.5–101.0)
MONO#: 1.9 10*3/uL — ABNORMAL HIGH (ref 0.1–0.9)
MONO%: 5.9 % (ref 0.0–14.0)
NEUT#: 28.4 10*3/uL — ABNORMAL HIGH (ref 1.5–6.5)
RBC: 3.24 10*6/uL — ABNORMAL LOW (ref 3.70–5.45)
RDW: 17.2 % — ABNORMAL HIGH (ref 11.2–14.5)
WBC: 32.7 10*3/uL — ABNORMAL HIGH (ref 3.9–10.3)
lymph#: 2.1 10*3/uL (ref 0.9–3.3)

## 2013-03-21 NOTE — Progress Notes (Signed)
ID: Cynthia Griffin   DOB: 11/03/1945  MR#: [id number]  VHQ#:[id number]  PCP: Kerby Nora, MD GYN:  SU: Almond Lint OTHER MD: Dorothy Puffer, Jeralyn Ruths, Etter Sjogren  ALERT: Another patient with the same date of birth, middle initial, and also with a history of breast cancer is in actinic. Please make sure when dealing with Jhoselin L. Ehrler that you have the correct medical record number  HISTORY OF PRESENT ILLNESS: The patient tells me she had an unremarkable screening mammogram January of 2013 in [place]. I do not have that report. Shortly after that, February of 2013, she noted that her left nipple had become inverted. She initially thought this was due to the mammogram itself, but eventually brought it to her primary care physician's attention. This was followed and there was no apparent change when she was next seen December 2013. The patient however became concerned and decided to go back to [facility] for her annual mammography This is where she had her annual mammograms until the last couple of years. Bilateral diagnostic mammography and left ultrasonography there 10/24/2012 showed definite nipple deformity with inversion and retraction. There was subtle architectural distortion and increase in density relative to the preceding study. Left breast ultrasonography in addition to the nipple retraction and inversion showed an area of hyperechoic architectural distortion measuring 1.4 cm in diameter with Doppler flow. Breast specific gamma imaging was obtained February 2024 and confirmed an area of moderate intensity isotope activity following a radial distribution from the left nipple measuring at least 4 cm. 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  Bilateral breast MRI was obtained 10/31/2012 and showed  an area of diffuse non-masslike asymmetric enhancement in the inferior aspect of the left breast measuring up to 7.4 cm transversely. There were no enlarged axillary or internal mammary nodes noted and the right breast was unremarkable. The patient's subsequent history is as detailed below.  INTERVAL HISTORY: Cynthia Griffin returns today  for followup of her left breast carcinoma. She is currently day 8 cycle 5 of 6 planned cycles of adjuvant chemotherapy. She received 4 cycles of q. three-week docetaxel/doxorubicin/cyclophosphamide, but this regimen was changed beginning with cycle 5 every 2 increased peripheral neuropathy. For her last 2 cycles, she is receiving every 3 week carboplatin/gemcitabine, and she received her first dose of this combination one week ago.  Cynthia Griffin tells me she tolerated this "cocktail" much better. She continues to be fatigued. Her hemoglobin has dropped a little, and she is having more shortness of breath with exertion. Otherwise, she had only mild muscular aches and pains. 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 It is not affecting her walking or any of her activities, nor is a keeping her awake at night.  Cynthia Griffin did notice some soreness in the lateral edge of her right breast over the past 2 days. She wonders if she slept on that side, or perhaps picked up something too heavy. She does mention, however, that the pain seems to be slightly better today than yesterday.  REVIEW OF SYSTEMS:  Cynthia Griffin has had no fevers or chills. She denies any skin changes or rashes and has had no signs of abnormal bleeding. She's eating and drinking well. She had no problems with nausea or emesis with  cycle 5, and is having regular bowel movements. She's had no cough, phlegm production, pleurisy, or chest pain. No abnormal headaches or dizziness. She's currently being followed at the lymphedema clinic for some mild  left upper extremity lymphedema.  A detailed review of systems is otherwise noncontributory.   PAST MEDICAL HISTORY: Past Medical History  Diagnosis Date  . Breast cancer   . Arthritis   . Anxiety   . Carpal tunnel syndrome of right wrist   . Family history of anesthesia complication     pneumothorax - post op 1980's    PAST SURGICAL HISTORY: Past Surgical History  Procedure Laterality Date  . Abdominal hysterectomy    . Appendectomy    . Tonsillectomy    . Meniscus repair      Right Knee  . Mastectomy w/ sentinel node biopsy Left 11/14/2012    Procedure: MASTECTOMY WITH SENTINEL LYMPH NODE BIOPSY;  Surgeon: Almond Lint, MD;  Location: [facility] OR;  Service: General;  Laterality: Left;  . Axillary lymph node dissection Left 12/01/2012    Procedure: AXILLARY LYMPH NODE DISSECTION;  Surgeon: Almond Lint, MD;  Location: [facility];  Service: General;  Laterality: Left;  . Portacath placement Right 12/01/2012    Procedure: INSERTION PORT-A-CATH;  Surgeon: Almond Lint, MD;  Location:  SURGERY CENTER;  Service: General;  Laterality: Right;   right shoulder rotator cuff repair, right hand carpal tunnel syndrome, bilateral salpingo--oophorectomy with her hysterectomy in the 1990s, Lasix surgery, right toe surgery  FAMILY HISTORY Family History  Problem Relation Age of Onset  . Bladder Cancer Father 28  . Lung cancer Father 65  . Bone cancer Paternal Uncle     ? late 42s   the patient's father died at the age of 73. He was diagnosed with lung cancer the age of 61 and with bladder cancer at the age of 37. He was a smoker. The patient's mother is living at age 61. She has a history of basal cell carcinoma. Cynthia Griffin had no brothers, one sister. There is no history of breast or ovarian cancer in the immediate family.  GYNECOLOGIC HISTORY: Menarche age 89, first live birth age 35, she is GX P2. She had her hysterectomy she thinks in 1992, and took hormone replacement for  approximately 18 years. In addition she took birth control pills between 1968 and 1977 stopping only to conceive.  SOCIAL HISTORY: She is a retired Environmental health practitioner. Her husband Cynthia Griffin (goes by Cynthia Griffin) is in Airline pilot for at [employer] in [place]. Son Cynthia Griffin lives in [place] and works in Chief Financial Officer. Son Cynthia Griffin lives in [place] and works in Airline pilot. The patient has 5 grandchildren" one on the way".   ADVANCED DIRECTIVES: Not in place  HEALTH MAINTENANCE: History  Substance Use Topics  . Smoking status: Former Smoker -- 1.25 packs/day for 29 years    Types: Cigarettes    Quit date: 09/07/1992  . Smokeless tobacco: Never Used  . Alcohol Use: 4.2 oz/week    7 Glasses of wine per week     Colonoscopy: August 2003  PAP: Status post hysterectomy  Bone density: Never  Lipid panel:  Allergies  Allergen Reactions  . Sulfa Antibiotics Nausea Only and Other (See Comments)    Stomach cramps    Current Outpatient Prescriptions  Medication Sig Dispense Refill  . Alum & Mag Hydroxide-Simeth (MAGIC MOUTHWASH W/LIDOCAINE) SOLN Take 5 mLs by mouth 4 (four) times daily as needed (mouth pain  and ulcerations).  240 mL  1  . Biotin 5000 MCG CAPS Take 5,000 mcg by mouth daily.      . calcium citrate-vitamin D (CITRACAL+D) 315-200 MG-UNIT per tablet Take 1 tablet by mouth daily.      . citalopram (CELEXA) 40 MG tablet Take 40 mg by mouth daily.      . cyclobenzaprine (FLEXERIL) 5 MG tablet Take 1 tablet (5 mg total) by mouth 3 (three) times daily as needed for muscle spasms.  30 tablet  0  . dexamethasone (DECADRON) 4 MG tablet Take 1 tablet (4 mg total) by mouth 2 (two) times daily with a meal.  30 tablet  3  . docusate sodium (COLACE) 100 MG capsule Take 100 mg by mouth 2 (two) times daily as needed for constipation.      . Glucosamine-Chondroit-Vit C-Mn (GLUCOSAMINE CHONDR 1500 COMPLX PO) Take 1 capsule by mouth daily.      Marland Kitchen  HYDROcodone-acetaminophen (NORCO/VICODIN) 5-325 MG per tablet Take 1 tablet by mouth every 6 (six) hours as needed for pain.  20 tablet  0  . lidocaine-prilocaine (EMLA) cream Apply topically as needed. Apply over port area 1-2 hours before chemo, cover with plastic wrap  30 g  3  . LORazepam (ATIVAN) 0.5 MG tablet TAKE 1 TABLET BY MOUTH AT BEDTIME FOR ANXIETY  30 tablet  0  . Multiple Vitamin (MULTIVITAMIN WITH MINERALS) TABS Take 1 tablet by mouth daily.      . ondansetron (ZOFRAN) 8 MG tablet Take 1 tablet (8 mg total) by mouth 2 (two) times daily.  16 tablet  0  . polyethylene glycol (MIRALAX / GLYCOLAX) packet Take 17 g by mouth daily as needed.      . prochlorperazine (COMPAZINE) 10 MG tablet Take 1 tablet (10 mg total) by mouth every 6 (six) hours as needed.  30 tablet  3  . prochlorperazine (COMPAZINE) 25 MG suppository Place 1 suppository (25 mg total) rectally every 12 (twelve) hours as needed for nausea.  12 suppository  0  . ranitidine (ZANTAC) 150 MG tablet Take 1 tablet (150 mg total) by mouth 2 (two) times daily.  60 tablet  5  . tobramycin-dexamethasone (TOBRADEX) ophthalmic solution Place 1 drop into both eyes 2 (two) times daily at 10 AM and 5 PM.  5 mL  0   No current facility-administered medications for this visit.    OBJECTIVE: Middle-aged white woman in no acute distress Filed Vitals:   03/21/13 1431  BP: 115/58  Pulse: 102  Temp: 98.7 F (37.1 C)  Resp: 20     Body mass index is 28.64 kg/(m^2).    ECOG FS: 1 Filed Weights   03/21/13 1431  Weight: 165 lb 8 oz (75.07 kg)   Sclerae unicteric Oropharynx clear, no ulcerations or evidence of thrush No cervical or supraclavicular adenopathy Lungs clear to auscultation bilaterally, no crackles, wheezes or rhonchi Heart regular rate and rhythm Abdomen soft, nontender to palpation, positive bowel sounds MSK no focal spinal tenderness  No peripheral edema Neuro: nonfocal, well oriented, friendly affect Breasts: The  right breast is unremarkable, with the exception of mild tenderness to palpation along the lateral edge. There no suspicious nodules and no skin changes. The left breast is status post mastectomy. There is no evidence of local recurrence. Axillae are benign bilaterally with no palpable adenopathy Port is intact in the right upper chest wall  LAB RESULTS: Lab Results  Component Value Date   WBC 32.7* 03/21/2013  NEUTROABS 28.4* 03/21/2013   HGB 9.3* 03/21/2013   HCT 28.9* 03/21/2013   MCV 89.1 03/21/2013   PLT 255 03/21/2013   CMP     Component Value Date/Time   NA 139 02/21/2013 1028   NA 139 12/03/2012 0155   K 3.8 02/21/2013 1028   K 3.8 12/03/2012 0155   CL 107 02/21/2013 1028   CL 104 12/03/2012 0155   CO2 23 02/21/2013 1028   CO2 28 12/03/2012 0155   GLUCOSE 97 02/21/2013 1028   GLUCOSE 114* 12/03/2012 0155   BUN 15.7 02/21/2013 1028   BUN 10 12/03/2012 0155   CREATININE 0.7 02/21/2013 1028   CREATININE 0.69 12/03/2012 0155   CREATININE 0.68 12/03/2012 0155   CALCIUM 9.4 02/21/2013 1028   CALCIUM 8.3* 12/03/2012 0155   PROT 6.8 02/21/2013 1028   ALBUMIN 3.3* 02/21/2013 1028   AST 15 02/21/2013 1028   ALT 23 02/21/2013 1028   ALKPHOS 76 02/21/2013 1028   BILITOT 0.30 02/21/2013 1028   GFRNONAA 89* 12/03/2012 0155   GFRNONAA 90* 12/03/2012 0155   GFRAA >90 12/03/2012 0155   GFRAA >90 12/03/2012 0155       STUDIES:  No results found.   ASSESSMENT: 67 y.o. [place] woman   (1)  status post left mastectomy and axillary lymph node dissection March 2014 for a pT3 pN2, stage IIIA invasive lobular carcinoma, grade 1, estrogen receptor 100% positive, progesterone receptor and HER-2 negative, with an MIB-1 of 8%.  (2) R pneumothorax with port placement March 2014- resolved  (3)  treated in the adjuvant setting with docetaxel/ doxorubicin/ cyclophosphamide, the original plan being to complete 6 q. three-week doses, with Neulasta given on day 2 for granulocyte support  (4) switched to  carboplatin and gemcitabine given day 1 only 4 cycles 5 and 6 of chemotherapy because of concerns regarding developing neuropathy  (5)  lymphedema, left upper extremity  (6) The patient will need postmastectomy radiation, to be followed by antiestrogen therapy. Reconstruction also to follow approximately 6 months after radiation has been completed.    PLAN:  Cynthia Griffin tolerated the carboplatin/gemcitabine combination extremely well, and will return in 2 weeks on July 29 for her sixth and final dose of adjuvant chemotherapy. She is already scheduled to meet with Dr. Mitzi Hansen following chemotherapy to discuss postmastectomy radiation. Of course when she completes radiation we will be discussing antiestrogen therapy.  In the meanwhile, she'll continue to be followed by the lymphedema clinic for left upper extremity lymphedema.  I believe the pain she's had her right breast is likely musculoskeletal in origin, but she will let us know if it worsens over the next 2 weeks. Otherwise, I will reevaluate will see her on July 29.  All this was reviewed in detail with [facility] today who voices understanding and agreement with our plan. She will call with any changes or problems prior to her next appointment.    Rishawn Walck    03/21/2013

## 2013-03-21 NOTE — Telephone Encounter (Signed)
Yes you can put her in earlier in next few months

## 2013-03-24 NOTE — Telephone Encounter (Signed)
Apptmt scheduled 04/12/2013

## 2013-03-29 DIAGNOSIS — C50919 Malignant neoplasm of unspecified site of unspecified female breast: Secondary | ICD-10-CM | POA: Diagnosis not present

## 2013-03-31 ENCOUNTER — Ambulatory Visit: Payer: Medicare Other

## 2013-04-04 ENCOUNTER — Ambulatory Visit (HOSPITAL_BASED_OUTPATIENT_CLINIC_OR_DEPARTMENT_OTHER): Payer: Medicare Other

## 2013-04-04 ENCOUNTER — Ambulatory Visit (HOSPITAL_BASED_OUTPATIENT_CLINIC_OR_DEPARTMENT_OTHER): Payer: Medicare Other | Admitting: Physician Assistant

## 2013-04-04 ENCOUNTER — Telehealth: Payer: Self-pay | Admitting: Oncology

## 2013-04-04 ENCOUNTER — Encounter: Payer: Self-pay | Admitting: Physician Assistant

## 2013-04-04 ENCOUNTER — Other Ambulatory Visit: Payer: BC Managed Care – PPO | Admitting: Lab

## 2013-04-04 ENCOUNTER — Other Ambulatory Visit (HOSPITAL_BASED_OUTPATIENT_CLINIC_OR_DEPARTMENT_OTHER): Payer: Medicare Other | Admitting: Lab

## 2013-04-04 VITALS — BP 156/78 | HR 81 | Temp 97.8°F | Resp 18 | Ht 63.0 in | Wt 168.5 lb

## 2013-04-04 DIAGNOSIS — I890 Lymphedema, not elsewhere classified: Secondary | ICD-10-CM | POA: Diagnosis not present

## 2013-04-04 DIAGNOSIS — Z5111 Encounter for antineoplastic chemotherapy: Secondary | ICD-10-CM

## 2013-04-04 DIAGNOSIS — C50319 Malignant neoplasm of lower-inner quadrant of unspecified female breast: Secondary | ICD-10-CM

## 2013-04-04 DIAGNOSIS — C50312 Malignant neoplasm of lower-inner quadrant of left female breast: Secondary | ICD-10-CM

## 2013-04-04 DIAGNOSIS — G620 Drug-induced polyneuropathy: Secondary | ICD-10-CM | POA: Insufficient documentation

## 2013-04-04 DIAGNOSIS — T451X5A Adverse effect of antineoplastic and immunosuppressive drugs, initial encounter: Secondary | ICD-10-CM | POA: Insufficient documentation

## 2013-04-04 LAB — CBC WITH DIFFERENTIAL/PLATELET
Basophils Absolute: 0 10*3/uL (ref 0.0–0.1)
Eosinophils Absolute: 0.1 10*3/uL (ref 0.0–0.5)
HGB: 10.4 g/dL — ABNORMAL LOW (ref 11.6–15.9)
LYMPH%: 21.1 % (ref 14.0–49.7)
MCV: 93.4 fL (ref 79.5–101.0)
MONO#: 0.8 10*3/uL (ref 0.1–0.9)
NEUT#: 4.1 10*3/uL (ref 1.5–6.5)
Platelets: 349 10*3/uL (ref 145–400)
RBC: 3.51 10*6/uL — ABNORMAL LOW (ref 3.70–5.45)
WBC: 6.3 10*3/uL (ref 3.9–10.3)

## 2013-04-04 MED ORDER — DEXAMETHASONE SODIUM PHOSPHATE 20 MG/5ML IJ SOLN
20.0000 mg | Freq: Once | INTRAMUSCULAR | Status: AC
  Administered 2013-04-04: 20 mg via INTRAVENOUS

## 2013-04-04 MED ORDER — SODIUM CHLORIDE 0.9 % IV SOLN
598.5000 mg | Freq: Once | INTRAVENOUS | Status: AC
  Administered 2013-04-04: 600 mg via INTRAVENOUS
  Filled 2013-04-04: qty 60

## 2013-04-04 MED ORDER — HEPARIN SOD (PORK) LOCK FLUSH 100 UNIT/ML IV SOLN
500.0000 [IU] | Freq: Once | INTRAVENOUS | Status: AC | PRN
  Administered 2013-04-04: 500 [IU]
  Filled 2013-04-04: qty 5

## 2013-04-04 MED ORDER — SODIUM CHLORIDE 0.9 % IV SOLN
Freq: Once | INTRAVENOUS | Status: AC
  Administered 2013-04-04: 14:00:00 via INTRAVENOUS

## 2013-04-04 MED ORDER — SODIUM CHLORIDE 0.9 % IV SOLN
800.0000 mg/m2 | Freq: Once | INTRAVENOUS | Status: AC
  Administered 2013-04-04: 1482 mg via INTRAVENOUS
  Filled 2013-04-04: qty 39

## 2013-04-04 MED ORDER — SODIUM CHLORIDE 0.9 % IJ SOLN
10.0000 mL | INTRAMUSCULAR | Status: DC | PRN
  Administered 2013-04-04: 10 mL
  Filled 2013-04-04: qty 10

## 2013-04-04 MED ORDER — ONDANSETRON 16 MG/50ML IVPB (CHCC)
16.0000 mg | Freq: Once | INTRAVENOUS | Status: AC
  Administered 2013-04-04: 16 mg via INTRAVENOUS

## 2013-04-04 NOTE — Progress Notes (Signed)
ID: Cynthia Griffin   DOB: 05-01-1946  MR#: [id number]  WUJ#:[id number]  PCP: Kerby Nora, MD GYN:  SUAlmond Lint OTHER MD: Dorothy Puffer, Jeralyn Ruths, Etter Sjogren  ALERT: Another patient with the same date of birth, middle initial, and also with a history of breast cancer is in actinic. Please make sure when dealing with Cynthia Griffin that you have the correct medical record number  HISTORY OF PRESENT ILLNESS: The patient tells me she had an unremarkable screening mammogram January of 2013 in [place]. I do not have that report. Shortly after that, February of 2013, she noted that her left nipple had become inverted. She initially thought this was due to the mammogram itself, but eventually brought it to her primary care physician's attention. This was followed and there was no apparent change when she was next seen December 2013. The patient however became concerned and decided to go back to [facility] for her annual mammography This is where she had her annual mammograms until the last couple of years. Bilateral diagnostic mammography and left ultrasonography there 10/24/2012 showed definite nipple deformity with inversion and retraction. There was subtle architectural distortion and increase in density relative to the preceding study. Left breast ultrasonography in addition to the nipple retraction and inversion showed an area of hyperechoic architectural distortion measuring 1.4 cm in diameter with Doppler flow. Breast specific gamma imaging was obtained February 2024 and confirmed an area of moderate intensity isotope activity following a radial distribution from the left nipple measuring at least 4 cm. 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  Bilateral breast MRI was obtained 10/31/2012 and showed  an area of diffuse non-masslike asymmetric enhancement in the inferior aspect of the left breast measuring up to 7.4 cm transversely. There were no enlarged axillary or internal mammary nodes noted and the right breast was unremarkable. The patient's subsequent history is as detailed below.  INTERVAL HISTORY: Cynthia Griffin returns today accompanied by her son for followup of her left breast carcinoma. She is due for her sixth and final planned cycles of adjuvant chemotherapy. She received 4 cycles of q. three-week docetaxel/doxorubicin/cyclophosphamide, but this regimen was changed beginning with cycle 5 due to increased peripheral neuropathy. For her last 2 cycles, she is receiving every 3 week carboplatin/gemcitabine, and she  is due for this combination today.  Cynthia Griffin  is feeling well. She is thrilled to be completing her adjuvant chemotherapy today. And cannot wait to "ringing the bell". Her energy level is good. The peripheral neuropathy is "less pronounced" but still fracture her right fingertips and her left toes, as well as the ball of her left foot. Fortunately it is not affecting her walking or any of her day to dayactivities, nor is a keeping her awake at night.  I will mention that her last appointment here, Cynthia Griffin complained of some soreness in the lateral edge of her right breast. This has completely resolved.  REVIEW OF SYSTEMS:  Cynthia Griffin has had no fevers or chills. She denies any skin changes or rashes and has had no signs of abnormal bleeding. She's eating and drinking well. She has had  only fleeting episodes of queasiness, but no emesis. She is having regular bowel movements.  She's had no cough, phlegm production, pleurisy, or chest pain. No abnormal headaches or dizziness. She denies any current myalgias,  arthralgias, or bony pain. She's currently being followed at the lymphedema clinic for some mild left upper extremity lymphedema.  A detailed review of systems is otherwise  noncontributory.   PAST MEDICAL HISTORY: Past Medical History  Diagnosis Date  . Breast cancer   . Arthritis   . Anxiety   . Carpal tunnel syndrome of right wrist   . Family history of anesthesia complication     pneumothorax - post op 1980's    PAST SURGICAL HISTORY: Past Surgical History  Procedure Laterality Date  . Abdominal hysterectomy    . Appendectomy    . Tonsillectomy    . Meniscus repair      Right Knee  . Mastectomy w/ sentinel node biopsy Left 11/14/2012    Procedure: MASTECTOMY WITH SENTINEL LYMPH NODE BIOPSY;  Surgeon: Almond Lint, MD;  Location: [facility] OR;  Service: General;  Laterality: Left;  . Axillary lymph node dissection Left 12/01/2012    Procedure: AXILLARY LYMPH NODE DISSECTION;  Surgeon: Almond Lint, MD;  Location: [facility];  Service: General;  Laterality: Left;  . Portacath placement Right 12/01/2012    Procedure: INSERTION PORT-A-CATH;  Surgeon: Almond Lint, MD;  Location: [facility];  Service: General;  Laterality: Right;   right shoulder rotator cuff repair, right hand carpal tunnel syndrome, bilateral salpingo--oophorectomy with her hysterectomy in the 1990s, Lasix surgery, right toe surgery  FAMILY HISTORY Family History  Problem Relation Age of Onset  . Bladder Cancer Father 4  . Lung cancer Father 53  . Bone cancer Paternal Uncle     ? late 42s   the patient's father died at the age of 80. He was diagnosed with lung cancer the age of 40 and with bladder cancer at the age of 75. He was a smoker. The patient's mother is living at age 73. She has a history of basal cell carcinoma. Cynthia Griffin had no brothers, one sister. There is no history of breast or ovarian cancer in the immediate family.  GYNECOLOGIC HISTORY: Menarche age 16, first live birth age 69, she is GX P2. She had her hysterectomy she thinks in 1992, and took hormone replacement for approximately 18 years. In addition she took birth control pills between 1968  and 1977 stopping only to conceive.  SOCIAL HISTORY: She is a retired Environmental health practitioner. Her husband Ramon Brant (goes by [employer]rik [place]ove) is in Airline pilot for at [employer] in [place]. Son Lakelyn Straus lives in [place] and works in Chief Financial Officer. Son R.Clarissia Mckeen II lives in [place] and works in Airline pilot. The patient has 5 grandchildren" one on the way".   ADVANCED DIRECTIVES: Not in place  HEALTH MAINTENANCE: History  Substance Use Topics  . Smoking status: Former Smoker -- 1.25 packs/day for 29 years    Types: Cigarettes    Quit date: 09/07/1992  . Smokeless tobacco: Never Used  . Alcohol Use: 4.2 oz/week    7 Glasses of wine per week     Colonoscopy: August 2003  PAP: Status post hysterectomy  Bone density: Never  Lipid panel:  Allergies  Allergen Reactions  . Sulfa Antibiotics Nausea Only and Other (See Comments)    Stomach cramps    Current Outpatient Prescriptions  Medication Sig Dispense Refill  . Alum & Mag Hydroxide-Simeth (MAGIC MOUTHWASH W/LIDOCAINE) SOLN Take 5 mLs by mouth 4 (four) times daily as needed (mouth pain and ulcerations).  240 mL  1  . Biotin 5000 MCG CAPS Take 5,000 mcg by mouth daily.      Marland Kitchen  calcium citrate-vitamin D (CITRACAL+D) 315-200 MG-UNIT per tablet Take 1 tablet by mouth daily.      . citalopram (CELEXA) 40 MG tablet Take 40 mg by mouth daily.      . cyclobenzaprine (FLEXERIL) 5 MG tablet Take 1 tablet (5 mg total) by mouth 3 (three) times daily as needed for muscle spasms.  30 tablet  0  . dexamethasone (DECADRON) 4 MG tablet Take 1 tablet (4 mg total) by mouth 2 (two) times daily with a meal.  30 tablet  3  . docusate sodium (COLACE) 100 MG capsule Take 100 mg by mouth 2 (two) times daily as needed for constipation.      . Glucosamine-Chondroit-Vit C-Mn (GLUCOSAMINE CHONDR 1500 COMPLX PO) Take 1 capsule by mouth daily.      Marland Kitchen HYDROcodone-acetaminophen (NORCO/VICODIN) 5-325 MG per tablet Take 1 tablet by mouth every  6 (six) hours as needed for pain.  20 tablet  0  . lidocaine-prilocaine (EMLA) cream Apply topically as needed. Apply over port area 1-2 hours before chemo, cover with plastic wrap  30 g  3  . LORazepam (ATIVAN) 0.5 MG tablet TAKE 1 TABLET BY MOUTH AT BEDTIME FOR ANXIETY  30 tablet  0  . Multiple Vitamin (MULTIVITAMIN WITH MINERALS) TABS Take 1 tablet by mouth daily.      . ondansetron (ZOFRAN) 8 MG tablet Take 1 tablet (8 mg total) by mouth 2 (two) times daily.  16 tablet  0  . polyethylene glycol (MIRALAX / GLYCOLAX) packet Take 17 g by mouth daily as needed.      . prochlorperazine (COMPAZINE) 10 MG tablet Take 1 tablet (10 mg total) by mouth every 6 (six) hours as needed.  30 tablet  3  . prochlorperazine (COMPAZINE) 25 MG suppository Place 1 suppository (25 mg total) rectally every 12 (twelve) hours as needed for nausea.  12 suppository  0  . ranitidine (ZANTAC) 150 MG tablet Take 1 tablet (150 mg total) by mouth 2 (two) times daily.  60 tablet  5  . tobramycin-dexamethasone (TOBRADEX) ophthalmic solution Place 1 drop into both eyes 2 (two) times daily at 10 AM and 5 PM.  5 mL  0   No current facility-administered medications for this visit.    OBJECTIVE: Middle-aged white woman in no acute distress Filed Vitals:   04/04/13 1135  BP: 156/78  Pulse: 81  Temp: 97.8 F (36.6 C)  Resp: 18     Body mass index is 29.86 kg/(m^2).    ECOG FS: 1 Filed Weights   04/04/13 1135  Weight: 168 lb 8 oz (76.431 kg)   Sclerae unicteric Oropharynx clear, no ulcerations or evidence of thrush No cervical or supraclavicular adenopathy Lungs clear to auscultation bilaterally, no crackles, wheezes or rhonchi Heart regular rate and rhythm, were appreciated Abdomen soft, nontender to palpation, positive bowel sounds MSK no focal spinal tenderness  No peripheral edema, with the exception of mild lymphedema in the left upper extremity. A compression sleeve and glove is in place.  Neuro: nonfocal, well  oriented, friendly affect Breasts: Deferred.  Axillae are benign bilaterally with no palpable adenopathy Port is intact in the right upper chest wall, with no edema, erythema, or evidence of infection.  LAB RESULTS: Lab Results  Component Value Date   WBC 6.3 04/04/2013   NEUTROABS 4.1 04/04/2013   HGB 10.4* 04/04/2013   HCT 32.8* 04/04/2013   MCV 93.4 04/04/2013   PLT 349 04/04/2013   CMP  CMP  Component Value Date/Time   NA 139 02/21/2013 1028   NA 139 12/03/2012 0155   K 3.8 02/21/2013 1028   K 3.8 12/03/2012 0155   CL 107 02/21/2013 1028   CL 104 12/03/2012 0155   CO2 23 02/21/2013 1028   CO2 28 12/03/2012 0155   GLUCOSE 97 02/21/2013 1028   GLUCOSE 114* 12/03/2012 0155   BUN 15.7 02/21/2013 1028   BUN 10 12/03/2012 0155   CREATININE 0.7 02/21/2013 1028   CREATININE 0.69 12/03/2012 0155   CREATININE 0.68 12/03/2012 0155   CALCIUM 9.4 02/21/2013 1028   CALCIUM 8.3* 12/03/2012 0155   PROT 6.8 02/21/2013 1028   ALBUMIN 3.3* 02/21/2013 1028   AST 15 02/21/2013 1028   ALT 23 02/21/2013 1028   ALKPHOS 76 02/21/2013 1028   BILITOT 0.30 02/21/2013 1028   GFRNONAA 89* 12/03/2012 0155   GFRNONAA 90* 12/03/2012 0155   GFRAA >90 12/03/2012 0155   GFRAA >90 12/03/2012 0155    STUDIES:  No results found.   ASSESSMENT: 67 y.o. Cynthia Griffin woman   (1)  status post left mastectomy and axillary lymph node dissection March 2014 for a pT3 pN2, stage IIIA invasive lobular carcinoma, grade 1, estrogen receptor 100% positive, progesterone receptor and HER-2 negative, with an MIB-1 of 8%.  (2) R pneumothorax with port placement March 2014- resolved  (3)  treated in the adjuvant setting with docetaxel/ doxorubicin/ cyclophosphamide, the original plan being to complete 6 q. three-week doses, with Neulasta given on day 2 for granulocyte support  (4) switched to carboplatin and gemcitabine given day 1 only 4 cycles 5 and 6 of chemotherapy because of concerns regarding developing neuropathy  (5)  lymphedema,  left upper extremity  (6) The patient will need postmastectomy radiation, to be followed by antiestrogen therapy. Reconstruction also to follow approximately 6 months after radiation has been completed.    PLAN:  Sameria will proceed to treatment today as scheduled for her sixth and final dose of adjuvant chemotherapy, consisting of carboplatin/gemcitabine. She will not need a Neulasta injection tomorrow. She will see Dr. Darnelle Catalan for repeat labs and physical exam on August 5, and is already scheduled to meet with Dr. Mitzi Hansen on August 7  to discuss postmastectomy radiation. Of course when she completes radiation we will be discussing antiestrogen therapy.  In the meanwhile, she'll continue to be followed by the lymphedema clinic for left upper extremity lymphedema.  All this was reviewed in detail with [facility] today who voices understanding and agreement with our plan. She will call with any changes or problems prior to her next appointment.    Althea Backs    04/04/2013

## 2013-04-04 NOTE — Patient Instructions (Addendum)
[facility] Discharge Instructions for Patients Receiving Chemotherapy  Today you received the following chemotherapy agents Gemzar and Carboplatin  To help prevent nausea and vomiting after your treatment, we encourage you to take your nausea medication as prescribed   If you develop nausea and vomiting that is not controlled by your nausea medication, call the clinic.   BELOW ARE SYMPTOMS THAT SHOULD BE REPORTED IMMEDIATELY:  *FEVER GREATER THAN 100.5 F  *CHILLS WITH OR WITHOUT FEVER  NAUSEA AND VOMITING THAT IS NOT CONTROLLED WITH YOUR NAUSEA MEDICATION  *UNUSUAL SHORTNESS OF BREATH  *UNUSUAL BRUISING OR BLEEDING  TENDERNESS IN MOUTH AND THROAT WITH OR WITHOUT PRESENCE OF ULCERS  *URINARY PROBLEMS  *BOWEL PROBLEMS  UNUSUAL RASH Items with * indicate a potential emergency and should be followed up as soon as possible.  Feel free to call the clinic you have any questions or concerns. The clinic phone number is [phone].    

## 2013-04-04 NOTE — Telephone Encounter (Signed)
, °

## 2013-04-05 ENCOUNTER — Ambulatory Visit: Payer: Medicare Other

## 2013-04-11 ENCOUNTER — Telehealth: Payer: Self-pay | Admitting: Oncology

## 2013-04-11 ENCOUNTER — Ambulatory Visit (HOSPITAL_BASED_OUTPATIENT_CLINIC_OR_DEPARTMENT_OTHER): Payer: Medicare Other | Admitting: Oncology

## 2013-04-11 ENCOUNTER — Other Ambulatory Visit (HOSPITAL_BASED_OUTPATIENT_CLINIC_OR_DEPARTMENT_OTHER): Payer: Medicare Other | Admitting: Lab

## 2013-04-11 VITALS — BP 133/73 | HR 77 | Temp 98.5°F | Resp 20 | Ht 63.0 in | Wt 165.7 lb

## 2013-04-11 DIAGNOSIS — C50319 Malignant neoplasm of lower-inner quadrant of unspecified female breast: Secondary | ICD-10-CM

## 2013-04-11 DIAGNOSIS — C50312 Malignant neoplasm of lower-inner quadrant of left female breast: Secondary | ICD-10-CM

## 2013-04-11 DIAGNOSIS — I890 Lymphedema, not elsewhere classified: Secondary | ICD-10-CM | POA: Diagnosis not present

## 2013-04-11 LAB — CBC WITH DIFFERENTIAL/PLATELET
BASO%: 0.3 % (ref 0.0–2.0)
EOS%: 1.3 % (ref 0.0–7.0)
MCH: 30.8 pg (ref 25.1–34.0)
MCV: 92 fL (ref 79.5–101.0)
MONO%: 11.5 % (ref 0.0–14.0)
NEUT#: 1.8 10*3/uL (ref 1.5–6.5)
RBC: 3.31 10*6/uL — ABNORMAL LOW (ref 3.70–5.45)
RDW: 15.9 % — ABNORMAL HIGH (ref 11.2–14.5)

## 2013-04-11 NOTE — Progress Notes (Signed)
ID: Cynthia Griffin   DOB: 1945/09/22  MR#: [id number]  WUJ#:[id number]  PCP: Kerby Nora, MD GYN:  SU: Almond Lint OTHER MD: Dorothy Puffer, Jeralyn Ruths, Etter Sjogren  ALERT: Another patient with the same date of birth, middle initial, and also with a history of breast cancer is in actinic. Please make sure when dealing with Cynthia Griffin that you have the correct medical record number  HISTORY OF PRESENT ILLNESS: The patient tells me she had an unremarkable screening mammogram January of 2013 in [place]. I do not have that report. Shortly after that, February of 2013, she noted that her left nipple had become inverted. She initially thought this was due to the mammogram itself, but eventually brought it to her primary care physician's attention. This was followed and there was no apparent change when she was next seen December 2013. The patient however became concerned and decided to go back to [facility] for her annual mammography This is where she had her annual mammograms until the last couple of years. Bilateral diagnostic mammography and left ultrasonography there 10/24/2012 showed definite nipple deformity with inversion and retraction. There was subtle architectural distortion and increase in density relative to the preceding study. Left breast ultrasonography in addition to the nipple retraction and inversion showed an area of hyperechoic architectural distortion measuring 1.4 cm in diameter with Doppler flow. Breast specific gamma imaging was obtained February 2024 and confirmed an area of moderate intensity isotope activity following a radial distribution from the left nipple measuring at least 4 cm. 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  Bilateral breast MRI was obtained 10/31/2012 and showed  an area of diffuse non-masslike asymmetric enhancement in the inferior aspect of the left breast measuring up to 7.4 cm transversely. There were no enlarged axillary or internal mammary nodes noted and the right breast was unremarkable. The patient's subsequent history is as detailed below.  INTERVAL HISTORY: Cynthia Griffin returns today for followup of her breast cancer. Today is day 8 of her sixth and final cycle of chemotherapy. To celebrate she had a "balloon releases ceremony". She got some pink balloons and [employer] filled them with helioum for her for free. She then got her neighbors and a block to join her while she release them" there went by cancer". She has felt fantastic since that moment.  REVIEW OF SYSTEMS:  Cynthia Griffin did remarkably well with her treatments. 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 The peripheral neuropathy thankfully is much better affecting chiefly her right fingers and left to [place]. She never had problems with nausea or vomiting, and fatigue was mild. She has occasional shortness of breath when walking up stairs are up a slope, but no cough pleurisy or  hemoptysis. A detailed review of systems today was otherwise noncontributory   PAST MEDICAL HISTORY: Past Medical History  Diagnosis Date  . Breast cancer   . Arthritis   . Anxiety   . Carpal tunnel syndrome of right wrist   . Family history of anesthesia complication     pneumothorax - post op 1980's    PAST SURGICAL HISTORY: Past Surgical History  Procedure Laterality Date  . Abdominal hysterectomy    . Appendectomy    .  Tonsillectomy    . Meniscus repair      Right Knee  . Mastectomy w/ sentinel node biopsy Left 11/14/2012    Procedure: MASTECTOMY WITH SENTINEL LYMPH NODE BIOPSY;  Surgeon: Almond Lint, MD;  Location: [facility] OR;  Service: General;  Laterality: Left;  . Axillary lymph node  dissection Left 12/01/2012    Procedure: AXILLARY LYMPH NODE DISSECTION;  Surgeon: Almond Lint, MD;  Location: [facility];  Service: General;  Laterality: Left;  . Portacath placement Right 12/01/2012    Procedure: INSERTION PORT-A-CATH;  Surgeon: Almond Lint, MD;  Location:  SURGERY CENTER;  Service: General;  Laterality: Right;   right shoulder rotator cuff repair, right hand carpal tunnel syndrome, bilateral salpingo--oophorectomy with her hysterectomy in the 1990s, Lasix surgery, right toe surgery  FAMILY HISTORY Family History  Problem Relation Age of Onset  . Bladder Cancer Father 67  . Lung cancer Father 58  . Bone cancer Paternal Uncle     ? late 38s   the patient's father died at the age of 46. He was diagnosed with lung cancer the age of 36 and with bladder cancer at the age of 25. He was a smoker. The patient's mother is living at age 18. She has a history of basal cell carcinoma. Cynthia Griffin had no brothers, one sister. There is no history of breast or ovarian cancer in the immediate family.  GYNECOLOGIC HISTORY: Menarche age 71, first live birth age 60, she is GX P2. She had her hysterectomy she thinks in 1992, and took hormone replacement for approximately 18 years. In addition she took birth control pills between 1968 and 1977 stopping only to conceive.  SOCIAL HISTORY: She is a retired Environmental health practitioner. Her husband Cynthia Griffin (goes by Cynthia Griffin) is in Airline pilot for at [employer] in [place]. Son Cynthia Griffin lives in [place] and works in Chief Financial Officer. Son Cynthia Griffin lives in [place] and works in Airline pilot. The patient has 5 grandchildren" one on the way".   ADVANCED DIRECTIVES: Not in place  HEALTH MAINTENANCE: History  Substance Use Topics  . Smoking status: Former Smoker -- 1.25 packs/day for 29 years    Types: Cigarettes    Quit date: 09/07/1992  . Smokeless tobacco: Never Used  . Alcohol Use: 4.2 oz/week    7  Glasses of wine per week     Colonoscopy: August 2003  PAP: Status post hysterectomy  Bone density: Never  Lipid panel:  Allergies  Allergen Reactions  . Sulfa Antibiotics Nausea Only and Other (See Comments)    Stomach cramps    Current Outpatient Prescriptions  Medication Sig Dispense Refill  . Alum & Mag Hydroxide-Simeth (MAGIC MOUTHWASH W/LIDOCAINE) SOLN Take 5 mLs by mouth 4 (four) times daily as needed (mouth pain and ulcerations).  240 mL  1  . Biotin 5000 MCG CAPS Take 5,000 mcg by mouth daily.      . calcium citrate-vitamin D (CITRACAL+D) 315-200 MG-UNIT per tablet Take 1 tablet by mouth daily.      . citalopram (CELEXA) 40 MG tablet Take 40 mg by mouth daily.      . cyclobenzaprine (FLEXERIL) 5 MG tablet Take 1 tablet (5 mg total) by mouth 3 (three) times daily as needed for muscle spasms.  30 tablet  0  . dexamethasone (DECADRON) 4 MG tablet Take 1 tablet (4 mg total) by mouth 2 (two) times daily with a meal.  30 tablet  3  . docusate sodium (COLACE) 100  MG capsule Take 100 mg by mouth 2 (two) times daily as needed for constipation.      . Glucosamine-Chondroit-Vit C-Mn (GLUCOSAMINE CHONDR 1500 COMPLX PO) Take 1 capsule by mouth daily.      Marland Kitchen HYDROcodone-acetaminophen (NORCO/VICODIN) 5-325 MG per tablet Take 1 tablet by mouth every 6 (six) hours as needed for pain.  20 tablet  0  . lidocaine-prilocaine (EMLA) cream Apply topically as needed. Apply over port area 1-2 hours before chemo, cover with plastic wrap  30 g  3  . LORazepam (ATIVAN) 0.5 MG tablet TAKE 1 TABLET BY MOUTH AT BEDTIME FOR ANXIETY  30 tablet  0  . Multiple Vitamin (MULTIVITAMIN WITH MINERALS) TABS Take 1 tablet by mouth daily.      . ondansetron (ZOFRAN) 8 MG tablet Take 1 tablet (8 mg total) by mouth 2 (two) times daily.  16 tablet  0  . polyethylene glycol (MIRALAX / GLYCOLAX) packet Take 17 g by mouth daily as needed.      . prochlorperazine (COMPAZINE) 10 MG tablet Take 1 tablet (10 mg total) by mouth  every 6 (six) hours as needed.  30 tablet  3  . prochlorperazine (COMPAZINE) 25 MG suppository Place 1 suppository (25 mg total) rectally every 12 (twelve) hours as needed for nausea.  12 suppository  0  . ranitidine (ZANTAC) 150 MG tablet Take 1 tablet (150 mg total) by mouth 2 (two) times daily.  60 tablet  5  . tobramycin-dexamethasone (TOBRADEX) ophthalmic solution Place 1 drop into both eyes 2 (two) times daily at 10 AM and 5 PM.  5 mL  0   No current facility-administered medications for this visit.    OBJECTIVE: Middle-aged white woman who appears well Filed Vitals:   04/11/13 1156  BP: 133/73  Pulse: 77  Temp: 98.5 F (36.9 C)  Resp: 20     Body mass index is 29.36 kg/(m^2).    ECOG FS: 1 Filed Weights   04/11/13 1156  Weight: 165 lb 11.2 oz (75.161 kg)   Sclerae unicteric Oropharynx clear, no  thrush No cervical or supraclavicular adenopathy Lungs clear bilaterally, no crackles, wheezes or rhonchi Heart regular rate and rhythm Abdomen soft, nontender to palpation, positive bowel sounds MSK no focal spinal tenderness  Mild lymphedema in the left upper extremity. A compression sleeve and glove is in place.  Neuro: nonfocal, well oriented, positive affect Breasts: The right breast is unremarkable. The left breast is status post mastectomy. There is no evidence of local recurrence. Left axilla is benign. Port is intact in the right upper chest wall, with no edema, erythema, or evidence of infection.  LAB RESULTS: Lab Results  Component Value Date   WBC 6.3 04/04/2013   NEUTROABS 4.1 04/04/2013   HGB 10.4* 04/04/2013   HCT 32.8* 04/04/2013   MCV 93.4 04/04/2013   PLT 349 04/04/2013   CMP  CMP     Component Value Date/Time   NA 139 02/21/2013 1028   NA 139 12/03/2012 0155   K 3.8 02/21/2013 1028   K 3.8 12/03/2012 0155   CL 107 02/21/2013 1028   CL 104 12/03/2012 0155   CO2 23 02/21/2013 1028   CO2 28 12/03/2012 0155   GLUCOSE 97 02/21/2013 1028   GLUCOSE 114* 12/03/2012  0155   BUN 15.7 02/21/2013 1028   BUN 10 12/03/2012 0155   CREATININE 0.7 02/21/2013 1028   CREATININE 0.69 12/03/2012 0155   CREATININE 0.68 12/03/2012 0155   CALCIUM 9.4 02/21/2013 1028  CALCIUM 8.3* 12/03/2012 0155   PROT 6.8 02/21/2013 1028   ALBUMIN 3.3* 02/21/2013 1028   AST 15 02/21/2013 1028   ALT 23 02/21/2013 1028   ALKPHOS 76 02/21/2013 1028   BILITOT 0.30 02/21/2013 1028   GFRNONAA 89* 12/03/2012 0155   GFRNONAA 90* 12/03/2012 0155   GFRAA >90 12/03/2012 0155   GFRAA >90 12/03/2012 0155    STUDIES: No results found.  ASSESSMENT: 67 y.o. [place] woman   (1)  status post left mastectomy and axillary lymph node dissection March 2014 for a pT3 pN2, stage IIIA invasive lobular carcinoma, grade 1, estrogen receptor 100% positive, progesterone receptor and HER-2 negative, with an MIB-1 of 8%.  (2) R pneumothorax with port placement March 2014- resolved  (3)  treated in the adjuvant setting with docetaxel/ doxorubicin/ cyclophosphamide, the original plan being to complete 6 q. three-week doses, with Neulasta given on day 2 for granulocyte support  (4) switched to carboplatin and gemcitabine given day 1 only for cycles 5 and 6 of chemotherapy because of concerns regarding developing neuropathy; completed 04/04/2013  (5)  lymphedema, left upper extremity, grade 1  (6) The patient will need postmastectomy radiation, to be followed by antiestrogen therapy. Reconstruction also to follow approximately 6 months after radiation has been completed.  PLAN:  Chemere has completed her adjuvant chemotherapy and is now ready for adjuvant radiation. She already has an appointment with Dr. Mitzi Hansen for later this week. I am going to see her again in October, by which time she should be done with radiation and ready to start anti-estrogen therapy. She knows to call for any problems that may develop before that visit.  I thought Cynthia Griffin's "balloon release ceremony" was terrific and asked her permission to  mention it to the newspaper. She is going to send me some photos by e-mail in case they want to do a feature.   Cynthia Griffin C    04/11/2013

## 2013-04-12 ENCOUNTER — Encounter: Payer: Self-pay | Admitting: Family Medicine

## 2013-04-12 ENCOUNTER — Other Ambulatory Visit: Payer: Self-pay

## 2013-04-12 ENCOUNTER — Ambulatory Visit (INDEPENDENT_AMBULATORY_CARE_PROVIDER_SITE_OTHER): Payer: Medicare Other | Admitting: Family Medicine

## 2013-04-12 VITALS — BP 118/60 | HR 80 | Temp 98.2°F | Ht 64.25 in | Wt 167.0 lb

## 2013-04-12 DIAGNOSIS — F324 Major depressive disorder, single episode, in partial remission: Secondary | ICD-10-CM | POA: Insufficient documentation

## 2013-04-12 DIAGNOSIS — C50919 Malignant neoplasm of unspecified site of unspecified female breast: Secondary | ICD-10-CM

## 2013-04-12 DIAGNOSIS — F329 Major depressive disorder, single episode, unspecified: Secondary | ICD-10-CM

## 2013-04-12 NOTE — Patient Instructions (Addendum)
Schedule medicare wellness visit in 09/2013 with fasting las prior.  It great meeting you today! Let me know if you need anything.

## 2013-04-12 NOTE — Progress Notes (Addendum)
Location of Breast Cancer:left breast  Histology per Pathology Report:  11/14/12 1. Lymph node, sentinel, biopsy, Left axilla - ONE LYMPH NODE, POSITIVE FOR METASTATIC LOBULAR CARCINOMA (1/1). - EXTRACAPSULAR EXTENSION IS PRESENT. 2. Lymph node, sentinel, biopsy, Left axilla - ONE LYMPH NODE, POSITIVE FOR METAST3/27/14  Lymph nodes, regional resection, Left axillary - METASTATIC CARCINOMA IN 4 OF 15 LYMPH NODES (4/15) WITH EXTRACAPSULAR EXTENSION. - FAT NECROSIS.ATIC LOBULAR CARCINOMA (1/1). 3. Lymph node, sentinel, biopsy, Left axilla - ONE LYMPH NODE, POSITIVE FOR METASTATIC LOBULAR CARCINOMA (1/1). - EXTRACAPSULAR EXTENSION IS PRESENT. 4. Lymph node, sentinel, biopsy, Left axilla - ONE LYMPH NODE, POSITIVE FOR METASTATIC LOBULAR CARCINOMA (1/1). 5. Breast, simple mastectomy, Left - MULTIFOCAL INVASIVE GRADE I LOBULAR CARCINOMA WITH THE LARGEST TUMOR FOCUS MEASURING 5.2 CM IN GREATEST DIMENSION. - ASSOCIATED LOBULAR CARCINOMA IN SITU.  12/01/12 Lymph nodes, regional resection, Left axillary - METASTATIC CARCINOMA IN 4 OF 15 LYMPH NODES (4/15) WITH EXTRACAPSULAR EXTENSION. - FAT NECROSIS.   Receptor Status: ER(postive), PR (negative), Her2-neu (negative)  Did patient present with symptoms (if so, please note symptoms) or was this found on screening mammography?: patient noted that her left nipple was inverted.  Past/Anticipated interventions by surgeon, if UJW:JXBJ MASTECTOMY W/ SENTINEL NODE BIOPSY on 11/14/2012 and AXILLARY LYMPH NODE DISSECTION on 12/01/2012 by Dr. Donell Beers.  Past/Anticipated interventions by medical oncology, if any:  6 cycles of  adjuvant chemotherapy. She received 4 cycles of q. three-week docetaxel/doxorubicin/cyclophosphamide, but this regimen was changed beginning with cycle 5 due to increased peripheral neuropathy. For her last 2 cycles, she is receiving every 3 week carboplatin/gemcitabine.  Last chemo 04/04/13.  Lymphedema issues, if any:  left arm, has  sleeve to wear prn - seen in lymphedema clinic. Very mild swelling today.  Pain issues, if any:  no  SAFETY ISSUES:  Prior radiation? no  Pacemaker/ICD? no  Possible current pregnancy? no  Is the patient on methotrexate? no  Current Complaints / other details:  Some fatigue from chemo, numbness of right fingertips and left toes and ball of left foot, denies pain, loss of appetite   Retired from Designer, industrial/product work at [employer]

## 2013-04-12 NOTE — Progress Notes (Signed)
  Subjective:    Patient ID: Cynthia Griffin, female    DOB: 04-Feb-1946, 67 y.o.   MRN: [id number]  HPI  67 year old female presents to establish.  She had previously seen at [facility]. Changing care because she was unhappy with care.  Diagnosed with breast cancer on left mastectomy, complete chemo therapy. Radiation upcoming for 6 1/2 weeks. She is managing it all pretty well.  Last CPX in 2014 Colonoscopy 2003, normal... repeat due.. We discsuu when she comes back for CPX  Last cholesterol 09/2012: tot 178, tri 83, HDL 64, LDL 97, goal < 130.    Review of Systems  Constitutional: Negative for fever.  HENT: Negative for ear pain.   Eyes: Negative for pain.  Respiratory: Positive for shortness of breath. Negative for cough.        Mild SOB associated with chemo  Cardiovascular: Negative for chest pain, palpitations and leg swelling.  Gastrointestinal: Positive for constipation. Negative for abdominal pain.  Genitourinary: Negative for dysuria.  Musculoskeletal: Negative for back pain.  Skin: Negative for rash.  Neurological: Positive for numbness. Negative for syncope.       Objective:   Physical Exam  Constitutional: Vital signs are normal. She appears well-developed and well-nourished. She is cooperative.  Non-toxic appearance. She does not appear ill. No distress.  Healthy appearing female with hair loss from chemo  HENT:  Head: Normocephalic.  Right Ear: Hearing, tympanic membrane, external ear and ear canal normal.  Left Ear: Hearing, tympanic membrane, external ear and ear canal normal.  Nose: Nose normal.  Eyes: Conjunctivae, EOM and lids are normal. Pupils are equal, round, and reactive to light. No foreign bodies found.  Neck: Trachea normal and normal range of motion. Neck supple. Carotid bruit is not present. No mass and no thyromegaly present.  Cardiovascular: Normal rate, regular rhythm, S1 normal, S2 normal, normal heart sounds and intact distal pulses.  Exam  reveals no gallop.   No murmur heard. Pulmonary/Chest: Effort normal and breath sounds normal. No respiratory distress. She has no wheezes. She has no rhonchi. She has no rales.  Abdominal: Soft. Normal appearance and bowel sounds are normal. She exhibits no distension, no fluid wave, no abdominal bruit and no mass. There is no hepatosplenomegaly. There is no tenderness. There is no rebound, no guarding and no CVA tenderness. No hernia.  Lymphadenopathy:    She has no cervical adenopathy.    She has no axillary adenopathy.  Neurological: She is alert. She has normal strength. No cranial nerve deficit or sensory deficit.  Skin: Skin is warm, dry and intact. No rash noted.  Psychiatric: Her speech is normal and behavior is normal. Judgment normal. Her mood appears not anxious. Cognition and memory are normal. She does not exhibit a depressed mood.          Assessment & Plan:

## 2013-04-13 ENCOUNTER — Ambulatory Visit
Admission: RE | Admit: 2013-04-13 | Discharge: 2013-04-13 | Disposition: A | Discharge status: Home / Self Care | Payer: Medicare Other | Source: Ambulatory Visit | Attending: Radiation Oncology | Admitting: Radiation Oncology

## 2013-04-13 ENCOUNTER — Encounter: Payer: Self-pay | Admitting: Radiation Oncology

## 2013-04-13 VITALS — BP 134/65 | HR 87 | Temp 98.2°F | Resp 20 | Wt 168.2 lb

## 2013-04-13 DIAGNOSIS — C50312 Malignant neoplasm of lower-inner quadrant of left female breast: Secondary | ICD-10-CM

## 2013-04-13 DIAGNOSIS — Z9010 Acquired absence of unspecified breast and nipple: Secondary | ICD-10-CM | POA: Insufficient documentation

## 2013-04-13 DIAGNOSIS — C773 Secondary and unspecified malignant neoplasm of axilla and upper limb lymph nodes: Secondary | ICD-10-CM | POA: Insufficient documentation

## 2013-04-13 DIAGNOSIS — C50319 Malignant neoplasm of lower-inner quadrant of unspecified female breast: Secondary | ICD-10-CM | POA: Diagnosis not present

## 2013-04-13 DIAGNOSIS — C50919 Malignant neoplasm of unspecified site of unspecified female breast: Secondary | ICD-10-CM | POA: Diagnosis not present

## 2013-04-13 DIAGNOSIS — Z9221 Personal history of antineoplastic chemotherapy: Secondary | ICD-10-CM | POA: Insufficient documentation

## 2013-04-13 NOTE — Assessment & Plan Note (Signed)
Stable on current medicaiton

## 2013-04-13 NOTE — Progress Notes (Signed)
  Radiation Oncology         [id number]) [phone] ________________________________  Name: Cynthia Griffin MRN: [id number]  Date: 04/13/2013  DOB: [birth year]  Follow-Up Visit Note  CC: Kerby Nora, MD  Magrinat, Valentino Hue, MD  Diagnosis:   Invasive lobular carcinoma, [id number]  Interval Since Last Radiation:  Not applicable   Narrative:  The patient returns today for routine follow-up.  The patient has completed a mastectomy on the left on 12/01/2012 followed by adjuvant chemotherapy. Her pathology returned positive for multifocal invasive grade 1 lobular carcinoma. The largest focus measured 5.2 cm in greatest dimension. The margins were negative. 4/15 lymph nodes were positive for carcinoma, extracapsular extension was present. The patient states that she did reasonably well with her course of chemotherapy. No major issues in terms of difficulty. The patient presents today for evaluation and planning for her anticipated course of postmastectomy radiation treatment.                              ALLERGIES:  is allergic to sulfa antibiotics.  Meds: Current Outpatient Prescriptions  Medication Sig Dispense Refill  . Alum & Mag Hydroxide-Simeth (MAGIC MOUTHWASH W/LIDOCAINE) SOLN Take 5 mLs by mouth 4 (four) times daily as needed (mouth pain and ulcerations).  240 mL  1  . Biotin 5000 MCG CAPS Take 5,000 mcg by mouth daily.      . citalopram (CELEXA) 40 MG tablet Take 40 mg by mouth daily.      Marland Kitchen docusate sodium (COLACE) 100 MG capsule Take 100 mg by mouth 2 (two) times daily as needed for constipation.      . Glucosamine-Chondroit-Vit C-Mn (GLUCOSAMINE CHONDR 1500 COMPLX PO) Take 1 capsule by mouth daily.      . Multiple Vitamin (MULTIVITAMIN WITH MINERALS) TABS Take 1 tablet by mouth daily.      . ranitidine (ZANTAC) 150 MG tablet        No current facility-administered medications for this encounter.    Physical Findings: The patient is in no acute distress. Patient is alert and oriented.  weight is 168 lb 3.2 oz (76.295 kg). Her oral temperature is 98.2 F (36.8 C). Her blood pressure is 134/65 and her pulse is 87. Her respiration is 20. .   The patient's surgical incision is well-healed.  Lab Findings: Lab Results  Component Value Date   WBC 3.7* 04/11/2013   HGB 10.2* 04/11/2013   HCT 30.4* 04/11/2013   MCV 92.0 04/11/2013   PLT 245 04/11/2013     Radiographic Findings: No results found.  Impression:    The patient is appropriate at this time to proceed with postmastectomy radiotherapy. The patient did have significant nodal disease as well as a T3 tumor and is at substantial risk for local/regional recurrence.  I discussed with her the role of radiotherapy in this setting. We discussed the benefits as well as the potential side effects and risks. All of her questions were answered.  Plan:  The patient will proceed with a simulation within the next one to 2 weeks. I anticipate a 6-1/2 weeks of radiotherapy.  I spent 15 minutes with the patient today, the majority of which was spent counseling the patient on the diagnosis of cancer and coordinating care.   Radene Gunning, M.D., Ph.D.

## 2013-04-13 NOTE — Assessment & Plan Note (Signed)
Currently  Has finished surgery and chemo... Radiation for 6 weeks is upcoming.

## 2013-04-13 NOTE — Progress Notes (Signed)
Please see the Nurse Progress Note in the MD Initial Consult Encounter for this patient. 

## 2013-04-18 ENCOUNTER — Telehealth (INDEPENDENT_AMBULATORY_CARE_PROVIDER_SITE_OTHER): Payer: Self-pay

## 2013-04-18 DIAGNOSIS — H9209 Otalgia, unspecified ear: Secondary | ICD-10-CM | POA: Diagnosis not present

## 2013-04-18 DIAGNOSIS — H9319 Tinnitus, unspecified ear: Secondary | ICD-10-CM | POA: Diagnosis not present

## 2013-04-18 NOTE — Telephone Encounter (Signed)
Patient calling to report that she has rec'd the okay to proceed with PAC removal per Dr. Darnelle Catalan.  Patient advised that I will forward a message to Dr. Donell Beers and assistant.  Once Dr. Donell Beers has reviewed Dr. Darrall Dears office notes we will call patient to discuss in further detail.

## 2013-04-20 ENCOUNTER — Telehealth (INDEPENDENT_AMBULATORY_CARE_PROVIDER_SITE_OTHER): Payer: Self-pay

## 2013-04-20 NOTE — Telephone Encounter (Signed)
Called the patient to let her know that Dr. Donell Beers is waiting from a response from Dr. Darnelle Catalan.  As soon as she hears from he we will contact the patient.

## 2013-04-20 NOTE — Telephone Encounter (Signed)
Pt calling to request that she be scheduled for PAC removal by Dr. Donell Beers.  I will forward her request.  I did see notes in Epic where Dr. Donell Beers is recommending she wait until her radiation is complete.  Patient would like clarification from Dr. Donell Beers or Dr. Darnelle Catalan.

## 2013-04-21 ENCOUNTER — Other Ambulatory Visit: Payer: Self-pay | Admitting: Internal Medicine

## 2013-04-21 NOTE — Telephone Encounter (Signed)
Close encounter 

## 2013-04-24 NOTE — Telephone Encounter (Signed)
Rx called in 

## 2013-04-26 NOTE — Telephone Encounter (Signed)
Spoke with the patient and asked her to call us tomorrow after her appt with Dr. Mitzi Hansen to let Dr. Donell Beers know when her first radiation treatment is scheduled.  Dr. Donell Beers will decide how to proceed from there. I explained how important it was for her to have all her strength when she begins radiation, and not be fatigued from a surgical procedure.  She understood and agreed.

## 2013-04-26 NOTE — Telephone Encounter (Signed)
Patient called back again checking on status of getting port removed. Advised we will check on status from Dr Darnelle Catalan and Dr Donell Beers and get back to her.

## 2013-04-27 ENCOUNTER — Other Ambulatory Visit: Payer: Self-pay | Admitting: Oncology

## 2013-04-27 ENCOUNTER — Ambulatory Visit
Admission: RE | Admit: 2013-04-27 | Discharge: 2013-04-27 | Disposition: A | Discharge status: Home / Self Care | Payer: Medicare Other | Source: Ambulatory Visit | Attending: Radiation Oncology | Admitting: Radiation Oncology

## 2013-04-27 DIAGNOSIS — Z510 Encounter for antineoplastic radiation therapy: Secondary | ICD-10-CM | POA: Insufficient documentation

## 2013-04-27 DIAGNOSIS — C50919 Malignant neoplasm of unspecified site of unspecified female breast: Secondary | ICD-10-CM | POA: Insufficient documentation

## 2013-04-27 DIAGNOSIS — C50312 Malignant neoplasm of lower-inner quadrant of left female breast: Secondary | ICD-10-CM

## 2013-04-27 DIAGNOSIS — Z170 Estrogen receptor positive status [ER+]: Secondary | ICD-10-CM | POA: Insufficient documentation

## 2013-04-27 DIAGNOSIS — C50319 Malignant neoplasm of lower-inner quadrant of unspecified female breast: Secondary | ICD-10-CM | POA: Diagnosis not present

## 2013-04-27 NOTE — Telephone Encounter (Signed)
Patient called back to let Cyndra Numbers know that her radiation will begin on 05/09/13.

## 2013-04-28 ENCOUNTER — Other Ambulatory Visit (INDEPENDENT_AMBULATORY_CARE_PROVIDER_SITE_OTHER): Payer: Self-pay | Admitting: General Surgery

## 2013-04-29 NOTE — Pre-Procedure Instructions (Signed)
Cynthia Griffin  04/29/2013   Your procedure is scheduled on:  August 26  Report to [facility] at 05:30 AM.  Call this number if you have problems the morning of surgery: [phone]   Remember:   Do not eat food or drink liquids after midnight.   Take these medicines the morning of surgery with A SIP OF WATER: Celexa, Zantac, Zofran (if needed)   Do not wear jewelry, make-up or nail polish.  Do not wear lotions, powders, or perfumes. You may wear deodorant.  Do not shave 48 hours prior to surgery. Men may shave face and neck.  Do not bring valuables to the hospital.  [facility] is not responsible                   for any belongings or valuables.  Contacts, dentures or bridgework may not be worn into surgery.  Leave suitcase in the car. After surgery it may be brought to your room.  For patients admitted to the hospital, checkout time is 11:00 AM the day of  discharge.   Patients discharged the day of surgery will not be allowed to drive  home.  Name and phone number of your driver: Family/ Friend  Special Instructions: Shower using CHG 2 nights before surgery and the night before surgery.  If you shower the day of surgery use CHG.  Use special wash - you have one bottle of CHG for all showers.  You should use approximately 1/3 of the bottle for each shower.   Please read over the following fact sheets that you were given: Pain Booklet, Coughing and Deep Breathing and Surgical Site Infection Prevention

## 2013-05-01 ENCOUNTER — Encounter (HOSPITAL_COMMUNITY)
Admission: RE | Admit: 2013-05-01 | Discharge: 2013-05-01 | Disposition: A | Discharge status: Home / Self Care | Payer: Medicare Other | Source: Ambulatory Visit | Attending: General Surgery | Admitting: General Surgery

## 2013-05-01 ENCOUNTER — Encounter (HOSPITAL_COMMUNITY): Payer: Self-pay

## 2013-05-01 DIAGNOSIS — Z452 Encounter for adjustment and management of vascular access device: Secondary | ICD-10-CM | POA: Diagnosis not present

## 2013-05-01 DIAGNOSIS — Z01812 Encounter for preprocedural laboratory examination: Secondary | ICD-10-CM | POA: Diagnosis not present

## 2013-05-01 DIAGNOSIS — Z9221 Personal history of antineoplastic chemotherapy: Secondary | ICD-10-CM | POA: Diagnosis not present

## 2013-05-01 DIAGNOSIS — Z9010 Acquired absence of unspecified breast and nipple: Secondary | ICD-10-CM | POA: Diagnosis not present

## 2013-05-01 DIAGNOSIS — C50919 Malignant neoplasm of unspecified site of unspecified female breast: Secondary | ICD-10-CM | POA: Diagnosis not present

## 2013-05-01 DIAGNOSIS — Z79899 Other long term (current) drug therapy: Secondary | ICD-10-CM | POA: Diagnosis not present

## 2013-05-01 HISTORY — DX: Pneumonia, unspecified organism: J18.9

## 2013-05-01 HISTORY — DX: Pneumothorax, unspecified: J93.9

## 2013-05-01 HISTORY — DX: Anemia, unspecified: D64.9

## 2013-05-01 HISTORY — DX: Polyneuropathy, unspecified: G62.9

## 2013-05-01 LAB — CBC
HCT: 34.7 % — ABNORMAL LOW (ref 36.0–46.0)
Hemoglobin: 11.4 g/dL — ABNORMAL LOW (ref 12.0–15.0)
MCHC: 32.9 g/dL (ref 30.0–36.0)

## 2013-05-01 LAB — BASIC METABOLIC PANEL
BUN: 11 mg/dL (ref 6–23)
Chloride: 106 mEq/L (ref 96–112)
GFR calc non Af Amer: 87 mL/min — ABNORMAL LOW (ref >90)
Glucose, Bld: 69 mg/dL — ABNORMAL LOW (ref 70–99)
Potassium: 4 mEq/L (ref 3.5–5.1)

## 2013-05-01 MED ORDER — CHLORHEXIDINE GLUCONATE 4 % EX LIQD: 1.0000 "application " | Freq: Once | CUTANEOUS | Status: DC

## 2013-05-01 MED ORDER — CEFAZOLIN SODIUM-DEXTROSE 2-3 GM-% IV SOLR
2.0000 g | INTRAVENOUS | Status: AC
  Administered 2013-05-02: 2 g via INTRAVENOUS
  Filled 2013-05-01: qty 50

## 2013-05-01 NOTE — Progress Notes (Signed)
  Radiation Oncology         [phone]) [phone] ________________________________  Name: Cynthia Griffin MRN: [id number]  Date: 04/27/2013  DOB: [birth year]  RESPIRATORY MOTION MANAGEMENT SIMULATION  NARRATIVE:  In order to account for effect of respiratory motion on target structures and other organs in the planning and delivery of radiotherapy, this patient underwent respiratory motion management simulation.  To accomplish this, when the patient was brought to the CT simulation planning suite, 4D respiratoy motion management CT images were obtained.  The CT images were loaded into the planning software.  Then, using a variety of tools including Cine, MIP, and standard views, the target volume was delineated.  Avoidance structures were contoured.  Treatment planning then occurred.  Dose volume histograms were generated and reviewed for each of the requested structure.  The resulting plan was carefully reviewed and approved today.

## 2013-05-01 NOTE — Progress Notes (Signed)
  Radiation Oncology         [phone]) [phone] ________________________________  Name: Cynthia Griffin MRN: [id number]  Date: 04/27/2013  DOB: December 15, 1945   SIMULATION AND TREATMENT PLANNING NOTE  The patient presented for simulation prior to beginning her course of radiation treatment for her diagnosis of left-sided breast cancer. The patient was placed in a supine position on a breast board. A customized accuform device was also constructed and this complex treatment device will be used on a daily basis during her treatment. In this fashion, a CT scan was obtained through the chest area and an isocenter was placed near the chest wall at the upper aspect of the right chest.  The patient will be planned to receive a course of radiation initially to a dose of 50.4 gray. This will consist of a 4 field technique targeting the chest wall as well as the supraclavicular region. Therefore 2 customized medial and lateral tangent fields have been created targeting the chest wall, and also 2 additional customized fields have been designed to treat the supraclavicular region both with a right supraclavicular field and a right posterior axillary boost field. This treatment will be accomplished at 1.8 gray per fraction. A complex isodose plan is requested to ensure that the target area is adequately covered dosimetrically. A forward planning technique will also be evaluated to determine if this approach improves the plan. It is anticipated that the patient will then receive a 10 gray boost to the scar plus margin. This will be accomplished at 2 gray per fraction. The final anticipated total dose therefore will correspond to 60.4 gray.    _______________________________   Radene Gunning, MD, PhD

## 2013-05-02 ENCOUNTER — Ambulatory Visit (HOSPITAL_COMMUNITY)
Admission: RE | Admit: 2013-05-02 | Discharge: 2013-05-02 | Disposition: A | Discharge status: Home / Self Care | Payer: Medicare Other | Source: Ambulatory Visit | Attending: General Surgery | Admitting: General Surgery

## 2013-05-02 ENCOUNTER — Encounter (HOSPITAL_COMMUNITY)
Admission: RE | Disposition: A | Discharge status: Home / Self Care | Payer: Self-pay | Source: Ambulatory Visit | Attending: General Surgery

## 2013-05-02 ENCOUNTER — Encounter (HOSPITAL_COMMUNITY): Payer: Self-pay | Admitting: Anesthesiology

## 2013-05-02 ENCOUNTER — Ambulatory Visit (HOSPITAL_COMMUNITY): Payer: Medicare Other | Admitting: Anesthesiology

## 2013-05-02 ENCOUNTER — Encounter (HOSPITAL_COMMUNITY): Payer: Self-pay | Admitting: *Deleted

## 2013-05-02 DIAGNOSIS — C50919 Malignant neoplasm of unspecified site of unspecified female breast: Secondary | ICD-10-CM | POA: Diagnosis not present

## 2013-05-02 DIAGNOSIS — Z9221 Personal history of antineoplastic chemotherapy: Secondary | ICD-10-CM | POA: Insufficient documentation

## 2013-05-02 DIAGNOSIS — Z452 Encounter for adjustment and management of vascular access device: Secondary | ICD-10-CM | POA: Diagnosis not present

## 2013-05-02 DIAGNOSIS — Z79899 Other long term (current) drug therapy: Secondary | ICD-10-CM | POA: Insufficient documentation

## 2013-05-02 DIAGNOSIS — Z01812 Encounter for preprocedural laboratory examination: Secondary | ICD-10-CM | POA: Insufficient documentation

## 2013-05-02 DIAGNOSIS — Z9010 Acquired absence of unspecified breast and nipple: Secondary | ICD-10-CM | POA: Insufficient documentation

## 2013-05-02 HISTORY — PX: PORT-A-CATH REMOVAL: SHX5289

## 2013-05-02 SURGERY — REMOVAL PORT-A-CATH
Anesthesia: Monitor Anesthesia Care | Site: Chest | Wound class: Clean

## 2013-05-02 SURGERY — Surgical Case
Anesthesia: *Unknown

## 2013-05-02 MED ORDER — ONDANSETRON HCL 4 MG/2ML IJ SOLN
INTRAMUSCULAR | Status: DC | PRN
  Administered 2013-05-02: 4 mg via INTRAVENOUS

## 2013-05-02 MED ORDER — PROPOFOL INFUSION 10 MG/ML OPTIME
INTRAVENOUS | Status: DC | PRN
  Administered 2013-05-02: 120 ug/kg/min via INTRAVENOUS

## 2013-05-02 MED ORDER — MIDAZOLAM HCL 5 MG/5ML IJ SOLN
INTRAMUSCULAR | Status: DC | PRN
  Administered 2013-05-02: 2 mg via INTRAVENOUS

## 2013-05-02 MED ORDER — LACTATED RINGERS IV SOLN
INTRAVENOUS | Status: DC | PRN
  Administered 2013-05-02: 07:00:00 via INTRAVENOUS

## 2013-05-02 MED ORDER — LIDOCAINE HCL 1 % IJ SOLN
INTRAMUSCULAR | Status: DC | PRN
  Administered 2013-05-02: 08:00:00 via SUBCUTANEOUS

## 2013-05-02 MED ORDER — BUPIVACAINE-EPINEPHRINE PF 0.25-1:200000 % IJ SOLN
INTRAMUSCULAR | Status: AC
  Filled 2013-05-02: qty 30

## 2013-05-02 MED ORDER — LIDOCAINE HCL (PF) 1 % IJ SOLN
INTRAMUSCULAR | Status: AC
  Filled 2013-05-02: qty 30

## 2013-05-02 MED ORDER — 0.9 % SODIUM CHLORIDE (POUR BTL) OPTIME
TOPICAL | Status: DC | PRN
  Administered 2013-05-02: 1000 mL

## 2013-05-02 MED ORDER — FENTANYL CITRATE 0.05 MG/ML IJ SOLN
INTRAMUSCULAR | Status: DC | PRN
  Administered 2013-05-02: 25 ug via INTRAVENOUS
  Administered 2013-05-02: 50 ug via INTRAVENOUS

## 2013-05-02 MED ORDER — HYDROCODONE-ACETAMINOPHEN 5-325 MG PO TABS: 1.0000 | ORAL_TABLET | ORAL | Status: DC | PRN

## 2013-05-02 SURGICAL SUPPLY — 40 items
BLADE SURG 11 STRL SS (BLADE) ×2 IMPLANT
BLADE SURG 15 STRL LF DISP TIS (BLADE) ×1 IMPLANT
BLADE SURG 15 STRL SS (BLADE) ×1
CHLORAPREP W/TINT 10.5 ML (MISCELLANEOUS) ×2 IMPLANT
CLOTH BEACON ORANGE TIMEOUT ST (SAFETY) ×2 IMPLANT
COVER SURGICAL LIGHT HANDLE (MISCELLANEOUS) ×2 IMPLANT
DECANTER SPIKE VIAL GLASS SM (MISCELLANEOUS) ×4 IMPLANT
DERMABOND ADVANCED (GAUZE/BANDAGES/DRESSINGS) ×1
DERMABOND ADVANCED .7 DNX12 (GAUZE/BANDAGES/DRESSINGS) ×1 IMPLANT
DRAPE PED LAPAROTOMY (DRAPES) ×2 IMPLANT
DRAPE UTILITY 15X26 W/TAPE STR (DRAPE) ×4 IMPLANT
ELECT CAUTERY BLADE 6.4 (BLADE) ×2 IMPLANT
ELECT REM PT RETURN 9FT ADLT (ELECTROSURGICAL) ×2
ELECTRODE REM PT RTRN 9FT ADLT (ELECTROSURGICAL) ×1 IMPLANT
GAUZE SPONGE 4X4 16PLY XRAY LF (GAUZE/BANDAGES/DRESSINGS) ×2 IMPLANT
GLOVE BIO SURGEON STRL SZ 6 (GLOVE) ×2 IMPLANT
GLOVE BIOGEL PI IND STRL 6.5 (GLOVE) ×1 IMPLANT
GLOVE BIOGEL PI IND STRL 7.0 (GLOVE) ×2 IMPLANT
GLOVE BIOGEL PI IND STRL 7.5 (GLOVE) ×1 IMPLANT
GLOVE BIOGEL PI INDICATOR 6.5 (GLOVE) ×1
GLOVE BIOGEL PI INDICATOR 7.0 (GLOVE) ×2
GLOVE BIOGEL PI INDICATOR 7.5 (GLOVE) ×1
GLOVE SURG SS PI 7.0 STRL IVOR (GLOVE) ×2 IMPLANT
GLOVE SURG SS PI 7.5 STRL IVOR (GLOVE) ×2 IMPLANT
GOWN PREVENTION PLUS XXLARGE (GOWN DISPOSABLE) ×2 IMPLANT
GOWN STRL NON-REIN LRG LVL3 (GOWN DISPOSABLE) ×4 IMPLANT
KIT BASIN OR (CUSTOM PROCEDURE TRAY) ×2 IMPLANT
KIT ROOM TURNOVER OR (KITS) ×2 IMPLANT
NEEDLE HYPO 25GX1X1/2 BEV (NEEDLE) ×2 IMPLANT
NS IRRIG 1000ML POUR BTL (IV SOLUTION) ×2 IMPLANT
PACK SURGICAL SETUP 50X90 (CUSTOM PROCEDURE TRAY) ×2 IMPLANT
PAD ARMBOARD 7.5X6 YLW CONV (MISCELLANEOUS) ×4 IMPLANT
PENCIL BUTTON HOLSTER BLD 10FT (ELECTRODE) ×2 IMPLANT
SUT MON AB 4-0 PC3 18 (SUTURE) ×2 IMPLANT
SUT VIC AB 3-0 SH 27 (SUTURE) ×1
SUT VIC AB 3-0 SH 27X BRD (SUTURE) ×1 IMPLANT
SYR BULB 3OZ (MISCELLANEOUS) ×2 IMPLANT
SYR CONTROL 10ML LL (SYRINGE) ×2 IMPLANT
TOWEL OR 17X24 6PK STRL BLUE (TOWEL DISPOSABLE) ×2 IMPLANT
TOWEL OR 17X26 10 PK STRL BLUE (TOWEL DISPOSABLE) ×2 IMPLANT

## 2013-05-02 NOTE — Anesthesia Preprocedure Evaluation (Addendum)
Anesthesia Evaluation  Patient identified by MRN, date of birth, ID band Patient awake    Reviewed: Allergy & Precautions, H&P , NPO status , Patient's Chart, lab work & pertinent test results  History of Anesthesia Complications Negative for: history of anesthetic complications  Airway Mallampati: I TM Distance: >3 FB Neck ROM: full    Dental no notable dental hx. (+) Teeth Intact and Dental Advisory Given   Pulmonary neg pulmonary ROS,  breath sounds clear to auscultation  Pulmonary exam normal       Cardiovascular Exercise Tolerance: Good negative cardio ROS  IRhythm:regular Rate:Normal     Neuro/Psych  Neuromuscular disease negative neurological ROS  negative psych ROS   GI/Hepatic negative GI ROS, Neg liver ROS,   Endo/Other  negative endocrine ROS  Renal/GU      Musculoskeletal negative musculoskeletal ROS (+)   Abdominal Normal abdominal exam  (+)   Peds  Hematology negative hematology ROS (+)   Anesthesia Other Findings   Reproductive/Obstetrics negative OB ROS IMPRESSION: Diffuse non mass-like enhancement measuring 7.4 cm in the inferior aspect of the left breast, causing nipple retraction corresponding well with the known invasive mammary carcinoma with lobular features.   RECOMMENDATION: Treatment planning is recommended.   THREE-DIMENSIONAL MR IMAGE RENDERING ON INDEPENDENT WORKSTATION:   Three-dimensional MR images were rendered by post-processing of the original MR data on an independent workstation.  The three- dimensional MR images were interpreted, and findings were reported in the accompanying complete MRI report for this study.   BI-RADS CATEGORY 6:  Known biopsy-proven malignancy - appropriate action should be taken.     Original Report Authenticated By: Baird Lyons, M.D.                              Anesthesia Physical Anesthesia Plan  ASA:  II  Anesthesia Plan: General LMA and MAC   Post-op Pain Management:    Induction: Intravenous  Airway Management Planned: Natural Airway and Simple Face Mask  Additional Equipment:   Intra-op Plan:   Post-operative Plan:   Informed Consent: I have reviewed the patients History and Physical, chart, labs and discussed the procedure including the risks, benefits and alternatives for the proposed anesthesia with the patient or authorized representative who has indicated his/her understanding and acceptance.   Dental Advisory Given and Dental advisory given  Plan Discussed with: CRNA and Surgeon  Anesthesia Plan Comments:        Anesthesia Quick Evaluation

## 2013-05-02 NOTE — Anesthesia Postprocedure Evaluation (Signed)
  Anesthesia Post-op Note  Patient: Cynthia Griffin  Procedure(s) Performed: Procedure(s): REMOVAL PORT-A-CATH (N/A)  Patient Location: PACU  Anesthesia Type:MAC  Level of Consciousness: awake  Airway and Oxygen Therapy: Patient Spontanous Breathing  Post-op Pain: mild  Post-op Assessment: Post-op Vital signs reviewed  Post-op Vital Signs: stable  Complications: No apparent anesthesia complications

## 2013-05-02 NOTE — Transfer of Care (Signed)
Immediate Anesthesia Transfer of Care Note  Patient: Cynthia Griffin  Procedure(s) Performed: Procedure(s): REMOVAL PORT-A-CATH (N/A)  Patient Location: PACU  Anesthesia Type:MAC  Level of Consciousness: awake, alert , oriented and patient cooperative  Airway & Oxygen Therapy: Patient Spontanous Breathing  Post-op Assessment: Report given to PACU RN, Post -op Vital signs reviewed and stable and Patient moving all extremities  Post vital signs: Reviewed and stable  Complications: No apparent anesthesia complications

## 2013-05-02 NOTE — H&P (Signed)
Cynthia Griffin is an 67 y.o. female.   Chief Complaint: left breast cancer HPI:  Pt is s/p chemotherapy and breast conservation surgery.  Radiation planned next week.  She is feeling really well and would like ot have port removed. Prior to XRT.    Past Medical History  Diagnosis Date  . Arthritis   . Anxiety   . Carpal tunnel syndrome of right wrist   . Family history of anesthesia complication     pneumothorax - post op 1980's  . Breast cancer 11/14/12    left, ER +, PR -, HER 2 -  . Pneumothorax on right March, 2014    while having port-a-cath placed  . Pneumonia   . 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 Anemia     Past Surgical History  Procedure Laterality Date  . Appendectomy    . Tonsillectomy    . Meniscus repair      Right Knee  . Mastectomy w/ sentinel node biopsy Left 11/14/2012    Procedure: MASTECTOMY WITH SENTINEL LYMPH NODE BIOPSY;  Surgeon: Almond Lint, MD;  Location: [facility] OR;  Service: General;  Laterality: Left;  . Axillary lymph node dissection Left 12/01/2012    Procedure: AXILLARY LYMPH NODE DISSECTION;  Surgeon: Almond Lint, MD;  Location: [facility];  Service: General;  Laterality: Left;  . Portacath placement Right 12/01/2012    Procedure: INSERTION PORT-A-CATH;  Surgeon: Almond Lint, MD;  Location: [facility];  Service: General;  Laterality: Right;  . Abdominal hysterectomy  1992    fibroids, total  . Mastectomy Left December 20, 2012  . Eye surgery Bilateral 2006    lasik surgery    Family History  Problem Relation Age of Onset  . Bladder Cancer Father 20  . Lung cancer Father 50  . Diabetes Father   . Bone cancer Paternal Uncle     ? late 1s  . Diabetes Mother   . Peripheral vascular disease Mother   . Multiple sclerosis Sister   . Parkinson's disease Paternal Grandmother   . Heart disease Paternal Grandfather    Social History:  reports that she quit smoking about 20 years ago. Her  smoking use included Cigarettes. She has a 36.25 pack-year smoking history. She has never used smokeless tobacco. She reports that she drinks about 4.2 ounces of alcohol per week. She reports that she does not use illicit drugs.  Allergies:  Allergies  Allergen Reactions  . Sulfa Antibiotics Nausea Only and Other (See Comments)    Stomach cramps    Medications Prior to Admission  Medication Sig Dispense Refill  . Biotin 5000 MCG CAPS Take 5,000 mcg by mouth daily.      . calcium citrate-vitamin D (CITRACAL+D) 315-200 MG-UNIT per tablet Take 1 tablet by mouth daily.      . citalopram (CELEXA) 40 MG tablet Take 40 mg by mouth daily.      Marland Kitchen dexamethasone (DECADRON) 4 MG tablet Take 4 mg by mouth 2 (two) times daily with a meal.      . Glucosamine-Chondroit-Vit C-Mn (GLUCOSAMINE CHONDR 1500 COMPLX PO) Take 1 capsule by mouth daily.      Marland Kitchen lidocaine (LMX) 4 % cream Apply 1 application topically as needed (for port).      . LORazepam (ATIVAN) 0.5 MG tablet Take 0.5 mg by mouth at bedtime as needed for anxiety.      . Multiple Vitamin (MULTIVITAMIN  WITH MINERALS) TABS Take 1 tablet by mouth daily.      . ondansetron (ZOFRAN) 8 MG tablet Take by mouth 2 (two) times daily.      . ranitidine (ZANTAC) 150 MG tablet Take 150 mg by mouth daily.       Marland Kitchen tobramycin-dexamethasone (TOBRADEX) ophthalmic ointment Place 1 application into both eyes 2 (two) times daily.        Results for orders placed during the hospital encounter of 05/01/13 (from the past 48 hour(s))  CBC     Status: Abnormal   Collection Time    05/01/13 10:48 AM      Result Value Range   WBC 3.3 (*) 4.0 - 10.5 K/uL   RBC 3.78 (*) 3.87 - 5.11 MIL/uL   Hemoglobin 11.4 (*) 12.0 - 15.0 g/dL   HCT 16.1 (*) 09.6 - 04.5 %   MCV 91.8  78.0 - 100.0 fL   MCH 30.2  26.0 - 34.0 pg   MCHC 32.9  30.0 - 36.0 g/dL   RDW 40.9  81.1 - 91.4 %   Platelets 285  150 - 400 K/uL  BASIC METABOLIC PANEL     Status: Abnormal   Collection Time     05/01/13 10:48 AM      Result Value Range   Sodium 143  135 - 145 mEq/L   Potassium 4.0  3.5 - 5.1 mEq/L   Chloride 106  96 - 112 mEq/L   CO2 28  19 - 32 mEq/L   Glucose, Bld 69 (*) 70 - 99 mg/dL   BUN 11  6 - 23 mg/dL   Creatinine, Ser 7.82  0.50 - 1.10 mg/dL   Calcium 9.4  8.4 - 95.6 mg/dL   GFR calc non Af Amer 87 (*) >90 mL/min   GFR calc Af Amer >90  >90 mL/min   Comment: (NOTE)     The eGFR has been calculated using the CKD EPI equation.     This calculation has not been validated in all clinical situations.     eGFR's persistently <90 mL/min signify possible Chronic Kidney     Disease.   No results found.  Review of Systems  All other systems reviewed and are negative.    Blood pressure 153/61, pulse 74, temperature 97.9 F (36.6 C), temperature source Oral, resp. rate 20, SpO2 100.00%. Physical Exam  Constitutional: She is oriented to person, place, and time. She appears well-developed and well-nourished. No distress.  HENT:  Head: Normocephalic and atraumatic.  Eyes: Pupils are equal, round, and reactive to light. No scleral icterus.  Neck: Normal range of motion. No thyromegaly present.  Cardiovascular: Normal rate, regular rhythm and intact distal pulses.   Respiratory: Effort normal. No respiratory distress.  Port right upper chest  GI: Soft. Bowel sounds are normal. She exhibits no distension. There is no tenderness.  Musculoskeletal: Normal range of motion. She exhibits no edema and no tenderness.  Neurological: She is alert and oriented to person, place, and time. Coordination normal.  Skin: Skin is warm and dry. No rash noted. She is not diaphoretic. No erythema. No pallor.  Psychiatric: She has a normal mood and affect. Her behavior is normal. Judgment and thought content normal.     Assessment/Plan Unneeded port a cath for left breast cancer.  Plan removal. Reviewed risks of bleeding, infection. She starts radiation next week.     [employer] 05/02/2013, 7:21 AM

## 2013-05-02 NOTE — Preoperative (Signed)
Beta Blockers   Reason not to administer Beta Blockers:Not Applicable 

## 2013-05-02 NOTE — Progress Notes (Signed)
Report given to angel rn as caregiver 

## 2013-05-02 NOTE — Op Note (Signed)
  PRE-OPERATIVE DIAGNOSIS:  un-needed Port-A-Cath for left breast cancer  POST-OPERATIVE DIAGNOSIS:  Same   PROCEDURE:  Procedure(s):  REMOVAL PORT-A-CATH  SURGEON:  Surgeon(s):  Almond Lint, MD  ANESTHESIA:  MAC+ local  EBL:   Minimal  SPECIMEN:  None  Complications : none known  Procedure:   Pt was  identified in the holding area and taken to the operating room where she was placed supine on the operating room table.  General anesthesia was induced via LMA.  The R upper chest was prepped and draped.  The prior incision was anesthetized with local anesthetic.  The incision was opened with a #15 blade.  The subcutaneous tissue was divided with the cautery.  The port was identified and the capsule opened.  The four 2-0 prolene sutures were removed.  The port was then removed and pressure held on the tract.  The catheter appeared intact without evidence of breakage.  The wound was inspected for hemostasis, which was achieved with cautery.  The wound was closed with 3-0 vicryl deep dermal interrupted sutures and 4-0 Monocryl running subcuticular suture.  The wound was cleaned, dried, and dressed with dermabond.  The patient was awakened from anesthesia and taken to the PACU in stable condition.  Needle, sponge, and instrument counts are correct.

## 2013-05-02 NOTE — Anesthesia Procedure Notes (Signed)
Procedure Name: MAC Date/Time: 05/02/2013 7:26 AM Performed by: Jerilee Hoh Pre-anesthesia Checklist: Patient identified, Emergency Drugs available, Suction available and Patient being monitored Patient Re-evaluated:Patient Re-evaluated prior to inductionOxygen Delivery Method: Simple face mask Placement Confirmation: positive ETCO2

## 2013-05-03 ENCOUNTER — Encounter (HOSPITAL_COMMUNITY): Payer: Self-pay | Admitting: General Surgery

## 2013-05-03 DIAGNOSIS — C50919 Malignant neoplasm of unspecified site of unspecified female breast: Secondary | ICD-10-CM | POA: Diagnosis not present

## 2013-05-03 DIAGNOSIS — Z510 Encounter for antineoplastic radiation therapy: Secondary | ICD-10-CM | POA: Diagnosis not present

## 2013-05-03 DIAGNOSIS — C50319 Malignant neoplasm of lower-inner quadrant of unspecified female breast: Secondary | ICD-10-CM | POA: Diagnosis not present

## 2013-05-03 DIAGNOSIS — Z170 Estrogen receptor positive status [ER+]: Secondary | ICD-10-CM | POA: Diagnosis not present

## 2013-05-04 ENCOUNTER — Ambulatory Visit
Admission: RE | Admit: 2013-05-04 | Discharge: 2013-05-04 | Disposition: A | Discharge status: Home / Self Care | Payer: Medicare Other | Source: Ambulatory Visit | Attending: Radiation Oncology | Admitting: Radiation Oncology

## 2013-05-04 DIAGNOSIS — C50319 Malignant neoplasm of lower-inner quadrant of unspecified female breast: Secondary | ICD-10-CM | POA: Diagnosis not present

## 2013-05-04 DIAGNOSIS — Z510 Encounter for antineoplastic radiation therapy: Secondary | ICD-10-CM | POA: Diagnosis not present

## 2013-05-04 DIAGNOSIS — C50919 Malignant neoplasm of unspecified site of unspecified female breast: Secondary | ICD-10-CM | POA: Diagnosis not present

## 2013-05-04 DIAGNOSIS — C50312 Malignant neoplasm of lower-inner quadrant of left female breast: Secondary | ICD-10-CM

## 2013-05-04 DIAGNOSIS — Z170 Estrogen receptor positive status [ER+]: Secondary | ICD-10-CM | POA: Diagnosis not present

## 2013-05-04 NOTE — Progress Notes (Signed)
  Radiation Oncology         [phone]) [phone] ________________________________  Name: Cynthia Griffin MRN: [id number]  Date: 05/04/2013  DOB: 27-Apr-1946  Simulation Verification Note   NARRATIVE: The patient was brought to the treatment unit and placed in the planned treatment position. The clinical setup was verified. Then port films were obtained and uploaded to the radiation oncology medical record software.  The treatment beams were carefully compared against the planned radiation fields. The position, location, and shape of the radiation fields was reviewed. The targeted volume of tissue appears to be appropriately covered by the radiation beams. Based on my personal review, I approved the simulation verification. The patient's treatment will proceed as planned.  ________________________________   Radene Gunning, MD, PhD

## 2013-05-09 ENCOUNTER — Ambulatory Visit
Admission: RE | Admit: 2013-05-09 | Discharge: 2013-05-09 | Disposition: A | Discharge status: Home / Self Care | Payer: Medicare Other | Source: Ambulatory Visit | Attending: Radiation Oncology | Admitting: Radiation Oncology

## 2013-05-09 DIAGNOSIS — Z170 Estrogen receptor positive status [ER+]: Secondary | ICD-10-CM | POA: Diagnosis not present

## 2013-05-09 DIAGNOSIS — Z510 Encounter for antineoplastic radiation therapy: Secondary | ICD-10-CM | POA: Diagnosis not present

## 2013-05-09 DIAGNOSIS — C50319 Malignant neoplasm of lower-inner quadrant of unspecified female breast: Secondary | ICD-10-CM | POA: Diagnosis not present

## 2013-05-09 DIAGNOSIS — C50919 Malignant neoplasm of unspecified site of unspecified female breast: Secondary | ICD-10-CM | POA: Diagnosis not present

## 2013-05-10 ENCOUNTER — Ambulatory Visit
Admission: RE | Admit: 2013-05-10 | Discharge: 2013-05-10 | Disposition: A | Discharge status: Home / Self Care | Payer: Medicare Other | Source: Ambulatory Visit | Attending: Radiation Oncology | Admitting: Radiation Oncology

## 2013-05-10 DIAGNOSIS — Z510 Encounter for antineoplastic radiation therapy: Secondary | ICD-10-CM | POA: Diagnosis not present

## 2013-05-10 DIAGNOSIS — Z170 Estrogen receptor positive status [ER+]: Secondary | ICD-10-CM | POA: Diagnosis not present

## 2013-05-10 DIAGNOSIS — C50912 Malignant neoplasm of unspecified site of left female breast: Secondary | ICD-10-CM

## 2013-05-10 DIAGNOSIS — C50919 Malignant neoplasm of unspecified site of unspecified female breast: Secondary | ICD-10-CM | POA: Diagnosis not present

## 2013-05-10 MED ORDER — ALRA NON-METALLIC DEODORANT (RAD-ONC)
1.0000 | Freq: Once | TOPICAL | Status: AC
Start: 2013-05-10 — End: 2013-05-10
  Administered 2013-05-10: 1 via TOPICAL

## 2013-05-10 MED ORDER — RADIAPLEXRX EX GEL
Freq: Once | CUTANEOUS | Status: AC
  Administered 2013-05-10: 10:00:00 via TOPICAL

## 2013-05-10 NOTE — Progress Notes (Signed)
Geni Bers here for post sim education.  Gave her the Radiation Therapy and You book and discussed the potential side effects of treatment including fatigue, hair loss and skin changes.  She was given radiaplex gel and was instructed to apply it to the treatment area twice a day, after treatment and at bedtime.  She was also given the skin care handout.  She was oriented to the clinic and was educated on PUT with Dr. Mitzi Hansen on Friday's.  She was advised to contact nursing with any questions or concerns.

## 2013-05-11 ENCOUNTER — Ambulatory Visit
Admission: RE | Admit: 2013-05-11 | Discharge: 2013-05-11 | Disposition: A | Discharge status: Home / Self Care | Payer: Medicare Other | Source: Ambulatory Visit | Attending: Radiation Oncology | Admitting: Radiation Oncology

## 2013-05-11 DIAGNOSIS — Z510 Encounter for antineoplastic radiation therapy: Secondary | ICD-10-CM | POA: Diagnosis not present

## 2013-05-11 DIAGNOSIS — C50919 Malignant neoplasm of unspecified site of unspecified female breast: Secondary | ICD-10-CM | POA: Diagnosis not present

## 2013-05-11 DIAGNOSIS — Z170 Estrogen receptor positive status [ER+]: Secondary | ICD-10-CM | POA: Diagnosis not present

## 2013-05-12 ENCOUNTER — Encounter: Payer: Self-pay | Admitting: Radiation Oncology

## 2013-05-12 ENCOUNTER — Ambulatory Visit
Admission: RE | Admit: 2013-05-12 | Discharge: 2013-05-12 | Disposition: A | Discharge status: Home / Self Care | Payer: Medicare Other | Source: Ambulatory Visit | Attending: Radiation Oncology | Admitting: Radiation Oncology

## 2013-05-12 VITALS — BP 132/74 | HR 85 | Temp 97.9°F | Resp 20 | Wt 167.2 lb

## 2013-05-12 DIAGNOSIS — Z510 Encounter for antineoplastic radiation therapy: Secondary | ICD-10-CM | POA: Diagnosis not present

## 2013-05-12 DIAGNOSIS — Z170 Estrogen receptor positive status [ER+]: Secondary | ICD-10-CM | POA: Diagnosis not present

## 2013-05-12 DIAGNOSIS — C50919 Malignant neoplasm of unspecified site of unspecified female breast: Secondary | ICD-10-CM | POA: Diagnosis not present

## 2013-05-12 DIAGNOSIS — C50312 Malignant neoplasm of lower-inner quadrant of left female breast: Secondary | ICD-10-CM

## 2013-05-12 NOTE — Progress Notes (Signed)
   Department of Radiation Oncology  Phone:  [phone] Fax:        [phone]  Weekly Treatment Note    Name: Cynthia Griffin Date: 05/12/2013 MRN: [id number] DOB: 08/13/1946   Current dose: 7.2 Gy  Current fraction: 4   MEDICATIONS: Current Outpatient Prescriptions  Medication Sig Dispense Refill  . Biotin 5000 MCG CAPS Take 5,000 mcg by mouth daily.      . calcium citrate-vitamin D (CITRACAL+D) 315-200 MG-UNIT per tablet Take 1 tablet by mouth daily.      . citalopram (CELEXA) 40 MG tablet Take 40 mg by mouth daily.      Marland Kitchen dexamethasone (DECADRON) 4 MG tablet Take 4 mg by mouth 2 (two) times daily with a meal.      . Glucosamine-Chondroit-Vit C-Mn (GLUCOSAMINE CHONDR 1500 COMPLX PO) Take 1 capsule by mouth daily.      . hyaluronate sodium (RADIAPLEXRX) GEL Apply 1 application topically 2 (two) times daily.      Marland Kitchen HYDROcodone-acetaminophen (NORCO) 5-325 MG per tablet Take 1-2 tablets by mouth every 4 (four) hours as needed for pain.  20 tablet  1  . lidocaine (LMX) 4 % cream Apply 1 application topically as needed (for port).      . LORazepam (ATIVAN) 0.5 MG tablet Take 0.5 mg by mouth at bedtime as needed for anxiety.      . Multiple Vitamin (MULTIVITAMIN WITH MINERALS) TABS Take 1 tablet by mouth daily.      . non-metallic deodorant Thornton Papas) MISC Apply 1 application topically daily as needed.      . ondansetron (ZOFRAN) 8 MG tablet Take by mouth 2 (two) times daily.      . ranitidine (ZANTAC) 150 MG tablet Take 150 mg by mouth daily.       Marland Kitchen tobramycin-dexamethasone (TOBRADEX) ophthalmic ointment Place 1 application into both eyes 2 (two) times daily.       No current facility-administered medications for this encounter.     ALLERGIES: Sulfa antibiotics   LABORATORY DATA:  Lab Results  Component Value Date   WBC 3.3* 05/01/2013   HGB 11.4* 05/01/2013   HCT 34.7* 05/01/2013   MCV 91.8 05/01/2013   PLT 285 05/01/2013   Lab Results  Component Value Date   NA 143  05/01/2013   K 4.0 05/01/2013   CL 106 05/01/2013   CO2 28 05/01/2013   Lab Results  Component Value Date   ALT 23 02/21/2013   AST 15 02/21/2013   ALKPHOS 76 02/21/2013   BILITOT 0.30 02/21/2013     NARRATIVE: Cynthia Griffin was seen today for weekly treatment management. The chart was checked and the patient's films were reviewed. The patient is doing very well with her first week of treatment. No pain, no fatigue.  PHYSICAL EXAMINATION: weight is 167 lb 3.2 oz (75.841 kg). Her temperature is 97.9 F (36.6 C). Her blood pressure is 132/74 and her pulse is 85. Her respiration is 20.        ASSESSMENT: The patient is doing satisfactorily with treatment.  PLAN: We will continue with the patient's radiation treatment as planned.

## 2013-05-12 NOTE — Progress Notes (Signed)
Pt denies pain, fatigue, loss of appetite. She is applying Radiaplex to left breast/chest wall treatment area.

## 2013-05-15 ENCOUNTER — Encounter: Payer: Self-pay | Admitting: Oncology

## 2013-05-15 ENCOUNTER — Ambulatory Visit
Admission: RE | Admit: 2013-05-15 | Discharge: 2013-05-15 | Disposition: A | Discharge status: Home / Self Care | Payer: Medicare Other | Source: Ambulatory Visit | Attending: Radiation Oncology | Admitting: Radiation Oncology

## 2013-05-15 DIAGNOSIS — C50919 Malignant neoplasm of unspecified site of unspecified female breast: Secondary | ICD-10-CM | POA: Diagnosis not present

## 2013-05-15 DIAGNOSIS — Z170 Estrogen receptor positive status [ER+]: Secondary | ICD-10-CM | POA: Diagnosis not present

## 2013-05-15 DIAGNOSIS — Z510 Encounter for antineoplastic radiation therapy: Secondary | ICD-10-CM | POA: Diagnosis not present

## 2013-05-15 NOTE — Progress Notes (Signed)
Put medical authorization form on nurse's desk.

## 2013-05-16 ENCOUNTER — Ambulatory Visit
Admission: RE | Admit: 2013-05-16 | Discharge: 2013-05-16 | Disposition: A | Discharge status: Home / Self Care | Payer: Medicare Other | Source: Ambulatory Visit | Attending: Radiation Oncology | Admitting: Radiation Oncology

## 2013-05-16 DIAGNOSIS — C50319 Malignant neoplasm of lower-inner quadrant of unspecified female breast: Secondary | ICD-10-CM | POA: Diagnosis not present

## 2013-05-16 DIAGNOSIS — Z510 Encounter for antineoplastic radiation therapy: Secondary | ICD-10-CM | POA: Diagnosis not present

## 2013-05-16 DIAGNOSIS — C50919 Malignant neoplasm of unspecified site of unspecified female breast: Secondary | ICD-10-CM | POA: Diagnosis not present

## 2013-05-16 DIAGNOSIS — Z170 Estrogen receptor positive status [ER+]: Secondary | ICD-10-CM | POA: Diagnosis not present

## 2013-05-17 ENCOUNTER — Ambulatory Visit
Admission: RE | Admit: 2013-05-17 | Discharge: 2013-05-17 | Disposition: A | Discharge status: Home / Self Care | Payer: Medicare Other | Source: Ambulatory Visit | Attending: Radiation Oncology | Admitting: Radiation Oncology

## 2013-05-17 DIAGNOSIS — Z510 Encounter for antineoplastic radiation therapy: Secondary | ICD-10-CM | POA: Diagnosis not present

## 2013-05-17 DIAGNOSIS — Z170 Estrogen receptor positive status [ER+]: Secondary | ICD-10-CM | POA: Diagnosis not present

## 2013-05-17 DIAGNOSIS — C50919 Malignant neoplasm of unspecified site of unspecified female breast: Secondary | ICD-10-CM | POA: Diagnosis not present

## 2013-05-18 ENCOUNTER — Ambulatory Visit
Admission: RE | Admit: 2013-05-18 | Discharge: 2013-05-18 | Disposition: A | Discharge status: Home / Self Care | Payer: Medicare Other | Source: Ambulatory Visit | Attending: Radiation Oncology | Admitting: Radiation Oncology

## 2013-05-18 DIAGNOSIS — Z170 Estrogen receptor positive status [ER+]: Secondary | ICD-10-CM | POA: Diagnosis not present

## 2013-05-18 DIAGNOSIS — Z510 Encounter for antineoplastic radiation therapy: Secondary | ICD-10-CM | POA: Diagnosis not present

## 2013-05-18 DIAGNOSIS — C50919 Malignant neoplasm of unspecified site of unspecified female breast: Secondary | ICD-10-CM | POA: Diagnosis not present

## 2013-05-19 ENCOUNTER — Ambulatory Visit
Admission: RE | Admit: 2013-05-19 | Discharge: 2013-05-19 | Disposition: A | Discharge status: Home / Self Care | Payer: Medicare Other | Source: Ambulatory Visit | Attending: Radiation Oncology | Admitting: Radiation Oncology

## 2013-05-19 ENCOUNTER — Encounter: Payer: Self-pay | Admitting: Radiation Oncology

## 2013-05-19 VITALS — BP 123/69 | HR 75 | Temp 98.3°F | Resp 20 | Wt 166.7 lb

## 2013-05-19 DIAGNOSIS — Z510 Encounter for antineoplastic radiation therapy: Secondary | ICD-10-CM | POA: Diagnosis not present

## 2013-05-19 DIAGNOSIS — C50312 Malignant neoplasm of lower-inner quadrant of left female breast: Secondary | ICD-10-CM

## 2013-05-19 DIAGNOSIS — C50919 Malignant neoplasm of unspecified site of unspecified female breast: Secondary | ICD-10-CM | POA: Diagnosis not present

## 2013-05-19 DIAGNOSIS — Z170 Estrogen receptor positive status [ER+]: Secondary | ICD-10-CM | POA: Diagnosis not present

## 2013-05-19 NOTE — Progress Notes (Signed)
   Department of Radiation Oncology  Phone:  [phone] Fax:        [phone]  Weekly Treatment Note    Name: Cynthia Griffin Date: 05/19/2013 MRN: [id number] DOB: 1946-04-24   Current dose: 16.2 Gy  Current fraction: 9   MEDICATIONS: Current Outpatient Prescriptions  Medication Sig Dispense Refill  . Biotin 5000 MCG CAPS Take 5,000 mcg by mouth daily.      . calcium citrate-vitamin D (CITRACAL+D) 315-200 MG-UNIT per tablet Take 1 tablet by mouth daily.      . citalopram (CELEXA) 40 MG tablet Take 40 mg by mouth daily.      Marland Kitchen dexamethasone (DECADRON) 4 MG tablet Take 4 mg by mouth 2 (two) times daily with a meal.      . Glucosamine-Chondroit-Vit C-Mn (GLUCOSAMINE CHONDR 1500 COMPLX PO) Take 1 capsule by mouth daily.      . hyaluronate sodium (RADIAPLEXRX) GEL Apply 1 application topically 2 (two) times daily.      Marland Kitchen HYDROcodone-acetaminophen (NORCO) 5-325 MG per tablet Take 1-2 tablets by mouth every 4 (four) hours as needed for pain.  20 tablet  1  . lidocaine (LMX) 4 % cream Apply 1 application topically as needed (for port).      . LORazepam (ATIVAN) 0.5 MG tablet Take 0.5 mg by mouth at bedtime as needed for anxiety.      . Multiple Vitamin (MULTIVITAMIN WITH MINERALS) TABS Take 1 tablet by mouth daily.      . non-metallic deodorant Thornton Papas) MISC Apply 1 application topically daily as needed.      . ondansetron (ZOFRAN) 8 MG tablet Take by mouth 2 (two) times daily.      . ranitidine (ZANTAC) 150 MG tablet Take 150 mg by mouth daily.       Marland Kitchen tobramycin-dexamethasone (TOBRADEX) ophthalmic ointment Place 1 application into both eyes 2 (two) times daily.       No current facility-administered medications for this encounter.     ALLERGIES: Sulfa antibiotics   LABORATORY DATA:  Lab Results  Component Value Date   WBC 3.3* 05/01/2013   HGB 11.4* 05/01/2013   HCT 34.7* 05/01/2013   MCV 91.8 05/01/2013   PLT 285 05/01/2013   Lab Results  Component Value Date   NA  143 05/01/2013   K 4.0 05/01/2013   CL 106 05/01/2013   CO2 28 05/01/2013   Lab Results  Component Value Date   ALT 23 02/21/2013   AST 15 02/21/2013   ALKPHOS 76 02/21/2013   BILITOT 0.30 02/21/2013     NARRATIVE: Cynthia Griffin was seen today for weekly treatment management. The chart was checked and the patient's films were reviewed. The patient is doing well with treatment. She has noticed a little bit of color change in the treatment area.  PHYSICAL EXAMINATION: weight is 166 lb 11.2 oz (75.615 kg). Her temperature is 98.3 F (36.8 C). Her blood pressure is 123/69 and her pulse is 75. Her respiration is 20.      slight erythema  ASSESSMENT: The patient is doing satisfactorily with treatment.  PLAN: We will continue with the patient's radiation treatment as planned.

## 2013-05-19 NOTE — Progress Notes (Signed)
Pt denies pain, loss of appetite, is slightly fatigued. She states her left breast treatment area is slightly pink, applying Radiaplex lotion twice daily.

## 2013-05-22 ENCOUNTER — Ambulatory Visit
Admission: RE | Admit: 2013-05-22 | Discharge: 2013-05-22 | Disposition: A | Discharge status: Home / Self Care | Payer: Medicare Other | Source: Ambulatory Visit | Attending: Radiation Oncology | Admitting: Radiation Oncology

## 2013-05-22 DIAGNOSIS — L259 Unspecified contact dermatitis, unspecified cause: Secondary | ICD-10-CM | POA: Diagnosis not present

## 2013-05-22 DIAGNOSIS — Z510 Encounter for antineoplastic radiation therapy: Secondary | ICD-10-CM | POA: Diagnosis not present

## 2013-05-22 DIAGNOSIS — L821 Other seborrheic keratosis: Secondary | ICD-10-CM | POA: Diagnosis not present

## 2013-05-22 DIAGNOSIS — Z170 Estrogen receptor positive status [ER+]: Secondary | ICD-10-CM | POA: Diagnosis not present

## 2013-05-22 DIAGNOSIS — C50919 Malignant neoplasm of unspecified site of unspecified female breast: Secondary | ICD-10-CM | POA: Diagnosis not present

## 2013-05-23 ENCOUNTER — Ambulatory Visit
Admission: RE | Admit: 2013-05-23 | Discharge: 2013-05-23 | Disposition: A | Discharge status: Home / Self Care | Payer: Medicare Other | Source: Ambulatory Visit | Attending: Radiation Oncology | Admitting: Radiation Oncology

## 2013-05-23 DIAGNOSIS — C50919 Malignant neoplasm of unspecified site of unspecified female breast: Secondary | ICD-10-CM | POA: Diagnosis not present

## 2013-05-23 DIAGNOSIS — C50319 Malignant neoplasm of lower-inner quadrant of unspecified female breast: Secondary | ICD-10-CM | POA: Diagnosis not present

## 2013-05-23 DIAGNOSIS — Z510 Encounter for antineoplastic radiation therapy: Secondary | ICD-10-CM | POA: Diagnosis not present

## 2013-05-23 DIAGNOSIS — Z170 Estrogen receptor positive status [ER+]: Secondary | ICD-10-CM | POA: Diagnosis not present

## 2013-05-24 ENCOUNTER — Ambulatory Visit
Admission: RE | Admit: 2013-05-24 | Discharge: 2013-05-24 | Disposition: A | Discharge status: Home / Self Care | Payer: Medicare Other | Source: Ambulatory Visit | Attending: Radiation Oncology | Admitting: Radiation Oncology

## 2013-05-24 DIAGNOSIS — Z170 Estrogen receptor positive status [ER+]: Secondary | ICD-10-CM | POA: Diagnosis not present

## 2013-05-24 DIAGNOSIS — Z510 Encounter for antineoplastic radiation therapy: Secondary | ICD-10-CM | POA: Diagnosis not present

## 2013-05-24 DIAGNOSIS — C50919 Malignant neoplasm of unspecified site of unspecified female breast: Secondary | ICD-10-CM | POA: Diagnosis not present

## 2013-05-25 ENCOUNTER — Ambulatory Visit
Admission: RE | Admit: 2013-05-25 | Discharge: 2013-05-25 | Disposition: A | Discharge status: Home / Self Care | Payer: Medicare Other | Source: Ambulatory Visit | Attending: Radiation Oncology | Admitting: Radiation Oncology

## 2013-05-25 DIAGNOSIS — Z170 Estrogen receptor positive status [ER+]: Secondary | ICD-10-CM | POA: Diagnosis not present

## 2013-05-25 DIAGNOSIS — C50919 Malignant neoplasm of unspecified site of unspecified female breast: Secondary | ICD-10-CM | POA: Diagnosis not present

## 2013-05-25 DIAGNOSIS — Z510 Encounter for antineoplastic radiation therapy: Secondary | ICD-10-CM | POA: Diagnosis not present

## 2013-05-26 ENCOUNTER — Ambulatory Visit
Admission: RE | Admit: 2013-05-26 | Discharge: 2013-05-26 | Disposition: A | Discharge status: Home / Self Care | Payer: Medicare Other | Source: Ambulatory Visit | Attending: Radiation Oncology | Admitting: Radiation Oncology

## 2013-05-26 ENCOUNTER — Encounter: Payer: Self-pay | Admitting: Radiation Oncology

## 2013-05-26 VITALS — BP 117/63 | HR 81 | Temp 98.7°F | Resp 20 | Wt 166.9 lb

## 2013-05-26 DIAGNOSIS — C50312 Malignant neoplasm of lower-inner quadrant of left female breast: Secondary | ICD-10-CM

## 2013-05-26 DIAGNOSIS — Z170 Estrogen receptor positive status [ER+]: Secondary | ICD-10-CM | POA: Diagnosis not present

## 2013-05-26 DIAGNOSIS — C50919 Malignant neoplasm of unspecified site of unspecified female breast: Secondary | ICD-10-CM | POA: Diagnosis not present

## 2013-05-26 DIAGNOSIS — Z510 Encounter for antineoplastic radiation therapy: Secondary | ICD-10-CM | POA: Diagnosis not present

## 2013-05-26 NOTE — Progress Notes (Signed)
   Department of Radiation Oncology  Phone:  [phone] Fax:        [phone]  Weekly Treatment Note    Name: Cynthia Griffin Date: 05/26/2013 MRN: [id number] DOB: [birth year]   Current dose: 25.2 Gy  Current fraction: 14   MEDICATIONS: Current Outpatient Prescriptions  Medication Sig Dispense Refill  . Biotin 5000 MCG CAPS Take 5,000 mcg by mouth daily.      . calcium citrate-vitamin D (CITRACAL+D) 315-200 MG-UNIT per tablet Take 1 tablet by mouth daily.      . citalopram (CELEXA) 40 MG tablet Take 40 mg by mouth daily.      . Glucosamine-Chondroit-Vit C-Mn (GLUCOSAMINE CHONDR 1500 COMPLX PO) Take 1 capsule by mouth daily.      . hyaluronate sodium (RADIAPLEXRX) GEL Apply 1 application topically 2 (two) times daily.      Marland Kitchen HYDROcodone-acetaminophen (NORCO) 5-325 MG per tablet Take 1-2 tablets by mouth every 4 (four) hours as needed for pain.  20 tablet  1  . Multiple Vitamin (MULTIVITAMIN WITH MINERALS) TABS Take 1 tablet by mouth daily.      . non-metallic deodorant Thornton Papas) MISC Apply 1 application topically daily as needed.      Marland Kitchen LORazepam (ATIVAN) 0.5 MG tablet Take 0.5 mg by mouth at bedtime as needed for anxiety.       No current facility-administered medications for this encounter.     ALLERGIES: Sulfa antibiotics   LABORATORY DATA:  Lab Results  Component Value Date   WBC 3.3* 05/01/2013   HGB 11.4* 05/01/2013   HCT 34.7* 05/01/2013   MCV 91.8 05/01/2013   PLT 285 05/01/2013   Lab Results  Component Value Date   NA 143 05/01/2013   K 4.0 05/01/2013   CL 106 05/01/2013   CO2 28 05/01/2013   Lab Results  Component Value Date   ALT 23 02/21/2013   AST 15 02/21/2013   ALKPHOS 76 02/21/2013   BILITOT 0.30 02/21/2013     NARRATIVE: Cynthia Griffin was seen today for weekly treatment management. The chart was checked and the patient's films were reviewed. The patient is doing well overall. She is using skin cream twice a day. She had a couple of moles  frozen in the posterior back.  PHYSICAL EXAMINATION: weight is 166 lb 14.4 oz (75.705 kg). Her oral temperature is 98.7 F (37.1 C). Her blood pressure is 117/63 and her pulse is 81. Her respiration is 20.      the patient's skin shows some diffuse erythema. None of the moles affected should be related to the area of treatment.  ASSESSMENT: The patient is doing satisfactorily with treatment.  PLAN: We will continue with the patient's radiation treatment as planned.

## 2013-05-26 NOTE — Progress Notes (Signed)
Weekly rad txs 14/33 lcw, slight erythema, skin intact, using radiaplex bid No pain

## 2013-05-29 ENCOUNTER — Ambulatory Visit
Admission: RE | Admit: 2013-05-29 | Discharge: 2013-05-29 | Disposition: A | Discharge status: Home / Self Care | Payer: Medicare Other | Source: Ambulatory Visit | Attending: Radiation Oncology | Admitting: Radiation Oncology

## 2013-05-29 DIAGNOSIS — Z170 Estrogen receptor positive status [ER+]: Secondary | ICD-10-CM | POA: Diagnosis not present

## 2013-05-29 DIAGNOSIS — C50919 Malignant neoplasm of unspecified site of unspecified female breast: Secondary | ICD-10-CM | POA: Diagnosis not present

## 2013-05-29 DIAGNOSIS — Z510 Encounter for antineoplastic radiation therapy: Secondary | ICD-10-CM | POA: Diagnosis not present

## 2013-05-30 ENCOUNTER — Ambulatory Visit
Admission: RE | Admit: 2013-05-30 | Discharge: 2013-05-30 | Disposition: A | Discharge status: Home / Self Care | Payer: Medicare Other | Source: Ambulatory Visit | Attending: Radiation Oncology | Admitting: Radiation Oncology

## 2013-05-30 DIAGNOSIS — C50319 Malignant neoplasm of lower-inner quadrant of unspecified female breast: Secondary | ICD-10-CM | POA: Diagnosis not present

## 2013-05-30 DIAGNOSIS — Z510 Encounter for antineoplastic radiation therapy: Secondary | ICD-10-CM | POA: Diagnosis not present

## 2013-05-30 DIAGNOSIS — C50919 Malignant neoplasm of unspecified site of unspecified female breast: Secondary | ICD-10-CM | POA: Diagnosis not present

## 2013-05-30 DIAGNOSIS — Z170 Estrogen receptor positive status [ER+]: Secondary | ICD-10-CM | POA: Diagnosis not present

## 2013-05-31 ENCOUNTER — Ambulatory Visit
Admission: RE | Admit: 2013-05-31 | Discharge: 2013-05-31 | Disposition: A | Discharge status: Home / Self Care | Payer: Medicare Other | Source: Ambulatory Visit | Attending: Radiation Oncology | Admitting: Radiation Oncology

## 2013-05-31 ENCOUNTER — Other Ambulatory Visit: Payer: Self-pay | Admitting: *Deleted

## 2013-05-31 DIAGNOSIS — Z510 Encounter for antineoplastic radiation therapy: Secondary | ICD-10-CM | POA: Diagnosis not present

## 2013-05-31 DIAGNOSIS — C50919 Malignant neoplasm of unspecified site of unspecified female breast: Secondary | ICD-10-CM | POA: Diagnosis not present

## 2013-05-31 DIAGNOSIS — Z170 Estrogen receptor positive status [ER+]: Secondary | ICD-10-CM | POA: Diagnosis not present

## 2013-05-31 MED ORDER — METOPROLOL TARTRATE 50 MG PO TABS: 100.0000 mg | ORAL_TABLET | Freq: Two times a day (BID) | ORAL | Status: DC

## 2013-06-01 ENCOUNTER — Ambulatory Visit
Admission: RE | Admit: 2013-06-01 | Discharge: 2013-06-01 | Disposition: A | Discharge status: Home / Self Care | Payer: Medicare Other | Source: Ambulatory Visit | Attending: Radiation Oncology | Admitting: Radiation Oncology

## 2013-06-01 DIAGNOSIS — Z170 Estrogen receptor positive status [ER+]: Secondary | ICD-10-CM | POA: Diagnosis not present

## 2013-06-01 DIAGNOSIS — C50919 Malignant neoplasm of unspecified site of unspecified female breast: Secondary | ICD-10-CM | POA: Diagnosis not present

## 2013-06-01 DIAGNOSIS — Z510 Encounter for antineoplastic radiation therapy: Secondary | ICD-10-CM | POA: Diagnosis not present

## 2013-06-02 ENCOUNTER — Ambulatory Visit
Admission: RE | Admit: 2013-06-02 | Discharge: 2013-06-02 | Disposition: A | Discharge status: Home / Self Care | Payer: Medicare Other | Source: Ambulatory Visit | Attending: Radiation Oncology | Admitting: Radiation Oncology

## 2013-06-02 ENCOUNTER — Encounter: Payer: Self-pay | Admitting: Radiation Oncology

## 2013-06-02 VITALS — BP 114/72 | HR 82 | Temp 98.3°F | Resp 20 | Wt 165.1 lb

## 2013-06-02 DIAGNOSIS — C50919 Malignant neoplasm of unspecified site of unspecified female breast: Secondary | ICD-10-CM | POA: Diagnosis not present

## 2013-06-02 DIAGNOSIS — C50312 Malignant neoplasm of lower-inner quadrant of left female breast: Secondary | ICD-10-CM

## 2013-06-02 DIAGNOSIS — Z510 Encounter for antineoplastic radiation therapy: Secondary | ICD-10-CM | POA: Diagnosis not present

## 2013-06-02 DIAGNOSIS — Z170 Estrogen receptor positive status [ER+]: Secondary | ICD-10-CM | POA: Diagnosis not present

## 2013-06-02 MED ORDER — RADIAPLEXRX EX GEL
Freq: Once | CUTANEOUS | Status: AC
  Administered 2013-06-02: 11:00:00 via TOPICAL

## 2013-06-02 NOTE — Addendum Note (Signed)
Encounter addended by: Lowella Petties, RN on: 06/02/2013 10:31 AM<BR>     Documentation filed: Inpatient MAR

## 2013-06-02 NOTE — Progress Notes (Signed)
   Department of Radiation Oncology  Phone:  [phone] Fax:        [phone]  Weekly Treatment Note    Name: Cynthia Griffin Date: 06/02/2013 MRN: [id number] DOB: [birth year]   Current dose: 34.2 Gy  Current fraction: 19   MEDICATIONS: Current Outpatient Prescriptions  Medication Sig Dispense Refill  . Biotin 5000 MCG CAPS Take 5,000 mcg by mouth daily.      . calcium citrate-vitamin D (CITRACAL+D) 315-200 MG-UNIT per tablet Take 1 tablet by mouth daily.      . citalopram (CELEXA) 40 MG tablet Take 40 mg by mouth daily.      . Glucosamine-Chondroit-Vit C-Mn (GLUCOSAMINE CHONDR 1500 COMPLX PO) Take 1 capsule by mouth daily.      . hyaluronate sodium (RADIAPLEXRX) GEL Apply 1 application topically 2 (two) times daily.      Marland Kitchen HYDROcodone-acetaminophen (NORCO) 5-325 MG per tablet Take 1-2 tablets by mouth every 4 (four) hours as needed for pain.  20 tablet  1  . LORazepam (ATIVAN) 0.5 MG tablet Take 0.5 mg by mouth at bedtime as needed for anxiety.      . Multiple Vitamin (MULTIVITAMIN WITH MINERALS) TABS Take 1 tablet by mouth daily.      . non-metallic deodorant Thornton Papas) MISC Apply 1 application topically daily as needed.       No current facility-administered medications for this encounter.     ALLERGIES: Sulfa antibiotics   LABORATORY DATA:  Lab Results  Component Value Date   WBC 3.3* 05/01/2013   HGB 11.4* 05/01/2013   HCT 34.7* 05/01/2013   MCV 91.8 05/01/2013   PLT 285 05/01/2013   Lab Results  Component Value Date   NA 143 05/01/2013   K 4.0 05/01/2013   CL 106 05/01/2013   CO2 28 05/01/2013   Lab Results  Component Value Date   ALT 23 02/21/2013   AST 15 02/21/2013   ALKPHOS 76 02/21/2013   BILITOT 0.30 02/21/2013     NARRATIVE: Cynthia Griffin was seen today for weekly treatment management. The chart was checked and the patient's films were reviewed. The patient is doing well this week. She has noticed a little more skin irritation, especially in the  upper axilla.  PHYSICAL EXAMINATION: weight is 165 lb 1.6 oz (74.889 kg). Her oral temperature is 98.3 F (36.8 C). Her blood pressure is 114/72 and her pulse is 82. Her respiration is 20.      diffuse hyperpigmentation/erythema present which is moderate at this point. Overall her skin looks good at this portion of her treatment. A little increased hyperpigmentation in the axilla.  ASSESSMENT: The patient is doing satisfactorily with treatment.  PLAN: We will continue with the patient's radiation treatment as planned. She will continue her current skin care regimen.

## 2013-06-02 NOTE — Addendum Note (Signed)
Encounter addended by: Lowella Petties, RN on: 06/02/2013 10:30 AM<BR>     Documentation filed: Orders

## 2013-06-02 NOTE — Progress Notes (Signed)
Weekly rad tx lcw, erythema, and raw looking under axilla,but kin is intact, radiaplex bid, good appetite, energy level is good,takes occasional naps in afternoon,s exercising walking more 9:50 AM

## 2013-06-05 ENCOUNTER — Ambulatory Visit
Admission: RE | Admit: 2013-06-05 | Discharge: 2013-06-05 | Disposition: A | Discharge status: Home / Self Care | Payer: Medicare Other | Source: Ambulatory Visit | Attending: Radiation Oncology | Admitting: Radiation Oncology

## 2013-06-05 DIAGNOSIS — Z170 Estrogen receptor positive status [ER+]: Secondary | ICD-10-CM | POA: Diagnosis not present

## 2013-06-05 DIAGNOSIS — Z510 Encounter for antineoplastic radiation therapy: Secondary | ICD-10-CM | POA: Diagnosis not present

## 2013-06-05 DIAGNOSIS — C50919 Malignant neoplasm of unspecified site of unspecified female breast: Secondary | ICD-10-CM | POA: Diagnosis not present

## 2013-06-06 ENCOUNTER — Ambulatory Visit
Admission: RE | Admit: 2013-06-06 | Discharge: 2013-06-06 | Disposition: A | Discharge status: Home / Self Care | Payer: Medicare Other | Source: Ambulatory Visit | Attending: Radiation Oncology | Admitting: Radiation Oncology

## 2013-06-06 DIAGNOSIS — C50919 Malignant neoplasm of unspecified site of unspecified female breast: Secondary | ICD-10-CM | POA: Diagnosis not present

## 2013-06-06 DIAGNOSIS — Z170 Estrogen receptor positive status [ER+]: Secondary | ICD-10-CM | POA: Diagnosis not present

## 2013-06-06 DIAGNOSIS — C50319 Malignant neoplasm of lower-inner quadrant of unspecified female breast: Secondary | ICD-10-CM | POA: Diagnosis not present

## 2013-06-06 DIAGNOSIS — Z510 Encounter for antineoplastic radiation therapy: Secondary | ICD-10-CM | POA: Diagnosis not present

## 2013-06-07 ENCOUNTER — Ambulatory Visit
Admission: RE | Admit: 2013-06-07 | Discharge: 2013-06-07 | Disposition: A | Discharge status: Home / Self Care | Payer: Medicare Other | Source: Ambulatory Visit | Attending: Radiation Oncology | Admitting: Radiation Oncology

## 2013-06-07 ENCOUNTER — Telehealth (INDEPENDENT_AMBULATORY_CARE_PROVIDER_SITE_OTHER): Payer: Self-pay

## 2013-06-07 DIAGNOSIS — C50919 Malignant neoplasm of unspecified site of unspecified female breast: Secondary | ICD-10-CM | POA: Diagnosis not present

## 2013-06-07 DIAGNOSIS — Z510 Encounter for antineoplastic radiation therapy: Secondary | ICD-10-CM | POA: Diagnosis not present

## 2013-06-07 DIAGNOSIS — Z170 Estrogen receptor positive status [ER+]: Secondary | ICD-10-CM | POA: Diagnosis not present

## 2013-06-07 NOTE — Telephone Encounter (Signed)
LM[facility] appt scheduled 06/14/13 at 1:45pm with Dr. Donell Beers.  No early am appts available.

## 2013-06-08 ENCOUNTER — Ambulatory Visit
Admission: RE | Admit: 2013-06-08 | Discharge: 2013-06-08 | Disposition: A | Discharge status: Home / Self Care | Payer: Medicare Other | Source: Ambulatory Visit | Attending: Radiation Oncology | Admitting: Radiation Oncology

## 2013-06-08 DIAGNOSIS — C50919 Malignant neoplasm of unspecified site of unspecified female breast: Secondary | ICD-10-CM | POA: Diagnosis not present

## 2013-06-08 DIAGNOSIS — Z170 Estrogen receptor positive status [ER+]: Secondary | ICD-10-CM | POA: Diagnosis not present

## 2013-06-08 DIAGNOSIS — Z510 Encounter for antineoplastic radiation therapy: Secondary | ICD-10-CM | POA: Diagnosis not present

## 2013-06-08 DIAGNOSIS — Z23 Encounter for immunization: Secondary | ICD-10-CM | POA: Diagnosis not present

## 2013-06-09 ENCOUNTER — Encounter: Payer: Self-pay | Admitting: Radiation Oncology

## 2013-06-09 ENCOUNTER — Ambulatory Visit
Admission: RE | Admit: 2013-06-09 | Discharge: 2013-06-09 | Disposition: A | Discharge status: Home / Self Care | Payer: Medicare Other | Source: Ambulatory Visit | Attending: Radiation Oncology | Admitting: Radiation Oncology

## 2013-06-09 ENCOUNTER — Encounter (INDEPENDENT_AMBULATORY_CARE_PROVIDER_SITE_OTHER): Payer: Medicare Other | Admitting: General Surgery

## 2013-06-09 VITALS — BP 118/58 | HR 84 | Temp 97.8°F | Resp 20 | Wt 164.9 lb

## 2013-06-09 DIAGNOSIS — C50312 Malignant neoplasm of lower-inner quadrant of left female breast: Secondary | ICD-10-CM

## 2013-06-09 DIAGNOSIS — Z170 Estrogen receptor positive status [ER+]: Secondary | ICD-10-CM | POA: Diagnosis not present

## 2013-06-09 DIAGNOSIS — Z510 Encounter for antineoplastic radiation therapy: Secondary | ICD-10-CM | POA: Diagnosis not present

## 2013-06-09 DIAGNOSIS — C50919 Malignant neoplasm of unspecified site of unspecified female breast: Secondary | ICD-10-CM | POA: Diagnosis not present

## 2013-06-09 NOTE — Progress Notes (Addendum)
weekly rad tx, 24/33 completed l breast chest wall, bright erythema raw under axilla and has peeled, using neopsorin under axilla, and radiaplex on chest wall bid Hurts under axilla stated, appetite good 10:03 AM

## 2013-06-09 NOTE — Progress Notes (Signed)
   Department of Radiation Oncology  Phone:  [phone] Fax:        [phone]  Weekly Treatment Note    Name: Cynthia Griffin Date: 06/09/2013 MRN: [id number] DOB: [birth year]   Current dose: 43.2 Gy  Current fraction: 24   MEDICATIONS: Current Outpatient Prescriptions  Medication Sig Dispense Refill  . Biotin 5000 MCG CAPS Take 5,000 mcg by mouth daily.      . calcium citrate-vitamin D (CITRACAL+D) 315-200 MG-UNIT per tablet Take 1 tablet by mouth daily.      . citalopram (CELEXA) 40 MG tablet Take 40 mg by mouth daily.      . Glucosamine-Chondroit-Vit C-Mn (GLUCOSAMINE CHONDR 1500 COMPLX PO) Take 1 capsule by mouth daily.      . hyaluronate sodium (RADIAPLEXRX) GEL Apply 1 application topically 2 (two) times daily.      Marland Kitchen HYDROcodone-acetaminophen (NORCO) 5-325 MG per tablet Take 1-2 tablets by mouth every 4 (four) hours as needed for pain.  20 tablet  1  . LORazepam (ATIVAN) 0.5 MG tablet Take 0.5 mg by mouth at bedtime as needed for anxiety.      . Multiple Vitamin (MULTIVITAMIN WITH MINERALS) TABS Take 1 tablet by mouth daily.      . non-metallic deodorant Thornton Papas) MISC Apply 1 application topically daily as needed.       No current facility-administered medications for this encounter.     ALLERGIES: Sulfa antibiotics   LABORATORY DATA:  Lab Results  Component Value Date   WBC 3.3* 05/01/2013   HGB 11.4* 05/01/2013   HCT 34.7* 05/01/2013   MCV 91.8 05/01/2013   PLT 285 05/01/2013   Lab Results  Component Value Date   NA 143 05/01/2013   K 4.0 05/01/2013   CL 106 05/01/2013   CO2 28 05/01/2013   Lab Results  Component Value Date   ALT 23 02/21/2013   AST 15 02/21/2013   ALKPHOS 76 02/21/2013   BILITOT 0.30 02/21/2013     NARRATIVE: Cynthia Griffin was seen today for weekly treatment management. The chart was checked and the patient's films were reviewed. The patient states that she is doing relatively well. Some continued skin irritation which is  increased some underneath the left arm.  PHYSICAL EXAMINATION: weight is 164 lb 14.4 oz (74.798 kg). Her oral temperature is 97.8 F (36.6 C). Her blood pressure is 118/58 and her pulse is 84. Her respiration is 20.      overall her skin looks good with diffuse radiation change as expected. Dry desquamation in the upper left axilla.  ASSESSMENT: The patient is doing satisfactorily with treatment.  PLAN: We will continue with the patient's radiation treatment as planned. The patient will continue her current skin care and used Neosporin cream in the left axilla.

## 2013-06-12 ENCOUNTER — Ambulatory Visit
Admission: RE | Admit: 2013-06-12 | Discharge: 2013-06-12 | Disposition: A | Discharge status: Home / Self Care | Payer: Medicare Other | Source: Ambulatory Visit | Attending: Radiation Oncology | Admitting: Radiation Oncology

## 2013-06-12 DIAGNOSIS — C50919 Malignant neoplasm of unspecified site of unspecified female breast: Secondary | ICD-10-CM | POA: Diagnosis not present

## 2013-06-12 DIAGNOSIS — Z170 Estrogen receptor positive status [ER+]: Secondary | ICD-10-CM | POA: Diagnosis not present

## 2013-06-12 DIAGNOSIS — Z510 Encounter for antineoplastic radiation therapy: Secondary | ICD-10-CM | POA: Diagnosis not present

## 2013-06-13 ENCOUNTER — Ambulatory Visit
Admission: RE | Admit: 2013-06-13 | Discharge: 2013-06-13 | Disposition: A | Discharge status: Home / Self Care | Payer: Medicare Other | Source: Ambulatory Visit | Attending: Radiation Oncology | Admitting: Radiation Oncology

## 2013-06-13 DIAGNOSIS — C50319 Malignant neoplasm of lower-inner quadrant of unspecified female breast: Secondary | ICD-10-CM | POA: Diagnosis not present

## 2013-06-13 DIAGNOSIS — C50919 Malignant neoplasm of unspecified site of unspecified female breast: Secondary | ICD-10-CM | POA: Diagnosis not present

## 2013-06-13 DIAGNOSIS — Z510 Encounter for antineoplastic radiation therapy: Secondary | ICD-10-CM | POA: Diagnosis not present

## 2013-06-13 DIAGNOSIS — Z170 Estrogen receptor positive status [ER+]: Secondary | ICD-10-CM | POA: Diagnosis not present

## 2013-06-14 ENCOUNTER — Ambulatory Visit (INDEPENDENT_AMBULATORY_CARE_PROVIDER_SITE_OTHER): Payer: Medicare Other | Admitting: General Surgery

## 2013-06-14 ENCOUNTER — Encounter: Payer: Self-pay | Admitting: Radiation Oncology

## 2013-06-14 ENCOUNTER — Ambulatory Visit
Admission: RE | Admit: 2013-06-14 | Discharge: 2013-06-14 | Disposition: A | Discharge status: Home / Self Care | Payer: Medicare Other | Source: Ambulatory Visit | Attending: Radiation Oncology | Admitting: Radiation Oncology

## 2013-06-14 ENCOUNTER — Encounter (INDEPENDENT_AMBULATORY_CARE_PROVIDER_SITE_OTHER): Payer: Self-pay | Admitting: General Surgery

## 2013-06-14 ENCOUNTER — Encounter (INDEPENDENT_AMBULATORY_CARE_PROVIDER_SITE_OTHER): Payer: Medicare Other | Admitting: General Surgery

## 2013-06-14 ENCOUNTER — Other Ambulatory Visit: Payer: Self-pay | Admitting: *Deleted

## 2013-06-14 VITALS — BP 115/68 | HR 83 | Temp 98.1°F | Resp 20 | Wt 164.6 lb

## 2013-06-14 VITALS — BP 142/72 | HR 80 | Resp 18 | Ht 64.75 in | Wt 165.0 lb

## 2013-06-14 DIAGNOSIS — C50319 Malignant neoplasm of lower-inner quadrant of unspecified female breast: Secondary | ICD-10-CM

## 2013-06-14 DIAGNOSIS — C50312 Malignant neoplasm of lower-inner quadrant of left female breast: Secondary | ICD-10-CM

## 2013-06-14 DIAGNOSIS — Z510 Encounter for antineoplastic radiation therapy: Secondary | ICD-10-CM | POA: Diagnosis not present

## 2013-06-14 DIAGNOSIS — C50919 Malignant neoplasm of unspecified site of unspecified female breast: Secondary | ICD-10-CM | POA: Diagnosis not present

## 2013-06-14 DIAGNOSIS — Z170 Estrogen receptor positive status [ER+]: Secondary | ICD-10-CM | POA: Diagnosis not present

## 2013-06-14 MED ORDER — TRAMADOL HCL 50 MG PO TABS: 100.0000 mg | ORAL_TABLET | Freq: Three times a day (TID) | ORAL | Status: DC | PRN

## 2013-06-14 NOTE — Progress Notes (Signed)
HISTORY: Patient is a 67 year old female who is status post left mastectomy and axillary lymph node dissection in March 2014.  We recently removed her Port-A-Cath. She is still completing her radiation therapy. She has not had any issues with her Port-A-Cath removal site. She is on any pain medication. She is having some skin burns from the radiation. She mentally feels much better having cut the Port-A-Cath out already.   EXAM: General:  Alert and oriented.   Incision:  Healing well.  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  Follow up in 3-6 months, depending on timing of oncology visit.          Maudry Diego, MD Surgical Oncology, General & Endocrine Surgery [facility]  Kerby Nora, MD Excell Seltzer, MD

## 2013-06-14 NOTE — Progress Notes (Signed)
weekly rad txs, 27/33 left chest wall, erythema, peeling dry desquamation under axilla and chest wall, itches some, c/o burns under axilla, uses neosporin there, gave hydrogel pads 1 box with instructions of use, to put in refrigerator not freezer,  1 gel pad can last 3-4 days , wash with warm soap and water,  Verbal understanding, great appetite, energyl level not too bad, takes naps in afternoon at times 10:07 AM ,

## 2013-06-14 NOTE — Progress Notes (Signed)
   Department of Radiation Oncology  Phone:  [phone] Fax:        [phone]  Weekly Treatment Note    Name: Cynthia Griffin Date: 06/14/2013 MRN: [id number] DOB: 1945-11-01   Current dose: 48.6 Gy  Current fraction: 27   MEDICATIONS: Current Outpatient Prescriptions  Medication Sig Dispense Refill  . Biotin 5000 MCG CAPS Take 5,000 mcg by mouth daily.      . calcium citrate-vitamin D (CITRACAL+D) 315-200 MG-UNIT per tablet Take 1 tablet by mouth daily.      . citalopram (CELEXA) 40 MG tablet Take 40 mg by mouth daily.      . Glucosamine-Chondroit-Vit C-Mn (GLUCOSAMINE CHONDR 1500 COMPLX PO) Take 1 capsule by mouth daily.      . hyaluronate sodium (RADIAPLEXRX) GEL Apply 1 application topically 2 (two) times daily.      Marland Kitchen HYDROcodone-acetaminophen (NORCO) 5-325 MG per tablet Take 1-2 tablets by mouth every 4 (four) hours as needed for pain.  20 tablet  1  . LORazepam (ATIVAN) 0.5 MG tablet Take 0.5 mg by mouth at bedtime as needed for anxiety.      . Multiple Vitamin (MULTIVITAMIN WITH MINERALS) TABS Take 1 tablet by mouth daily.      . non-metallic deodorant Thornton Papas) MISC Apply 1 application topically daily as needed.       No current facility-administered medications for this encounter.     ALLERGIES: Sulfa antibiotics   LABORATORY DATA:  Lab Results  Component Value Date   WBC 3.3* 05/01/2013   HGB 11.4* 05/01/2013   HCT 34.7* 05/01/2013   MCV 91.8 05/01/2013   PLT 285 05/01/2013   Lab Results  Component Value Date   NA 143 05/01/2013   K 4.0 05/01/2013   CL 106 05/01/2013   CO2 28 05/01/2013   Lab Results  Component Value Date   ALT 23 02/21/2013   AST 15 02/21/2013   ALKPHOS 76 02/21/2013   BILITOT 0.30 02/21/2013     NARRATIVE: Cynthia Griffin was seen today for weekly treatment management. The chart was checked and the patient's films were reviewed. The patient is doing says directly at this time. More skin irritation. She used ice on the affected  area yesterday and she was instructed not to do this. She was given hydrogel pads.  PHYSICAL EXAMINATION: weight is 164 lb 9.6 oz (74.662 kg). Her oral temperature is 98.1 F (36.7 C). Her blood pressure is 115/68 and her pulse is 83. Her respiration is 20.      diffuse erythema/radiation dermatitis. Some dry desquamation in the upper axilla.  ASSESSMENT: The patient is doing satisfactorily with treatment.  PLAN: We will continue with the patient's radiation treatment as planned. She will continue her current skin care regimen and will add hydrogel pads. She is going to begin her boost later this week which will be helpful.

## 2013-06-14 NOTE — Assessment & Plan Note (Signed)
No complications from port a cath removal.  Follow up in 3-6 months, depending on timing of oncology visit.

## 2013-06-14 NOTE — Patient Instructions (Signed)
Follow up in 3-6 months

## 2013-06-15 ENCOUNTER — Ambulatory Visit
Admission: RE | Admit: 2013-06-15 | Discharge: 2013-06-15 | Disposition: A | Discharge status: Home / Self Care | Payer: Medicare Other | Source: Ambulatory Visit | Attending: Radiation Oncology | Admitting: Radiation Oncology

## 2013-06-15 DIAGNOSIS — Z510 Encounter for antineoplastic radiation therapy: Secondary | ICD-10-CM | POA: Diagnosis not present

## 2013-06-15 DIAGNOSIS — Z170 Estrogen receptor positive status [ER+]: Secondary | ICD-10-CM | POA: Diagnosis not present

## 2013-06-15 DIAGNOSIS — C50919 Malignant neoplasm of unspecified site of unspecified female breast: Secondary | ICD-10-CM | POA: Diagnosis not present

## 2013-06-16 ENCOUNTER — Encounter (INDEPENDENT_AMBULATORY_CARE_PROVIDER_SITE_OTHER): Payer: Medicare Other | Admitting: General Surgery

## 2013-06-16 ENCOUNTER — Ambulatory Visit
Admission: RE | Admit: 2013-06-16 | Discharge: 2013-06-16 | Disposition: A | Discharge status: Home / Self Care | Payer: Medicare Other | Source: Ambulatory Visit | Attending: Radiation Oncology | Admitting: Radiation Oncology

## 2013-06-16 ENCOUNTER — Other Ambulatory Visit: Payer: Self-pay | Admitting: *Deleted

## 2013-06-16 DIAGNOSIS — Z170 Estrogen receptor positive status [ER+]: Secondary | ICD-10-CM | POA: Diagnosis not present

## 2013-06-16 DIAGNOSIS — Z510 Encounter for antineoplastic radiation therapy: Secondary | ICD-10-CM | POA: Diagnosis not present

## 2013-06-16 DIAGNOSIS — C50919 Malignant neoplasm of unspecified site of unspecified female breast: Secondary | ICD-10-CM | POA: Diagnosis not present

## 2013-06-19 ENCOUNTER — Ambulatory Visit
Admission: RE | Admit: 2013-06-19 | Discharge: 2013-06-19 | Disposition: A | Discharge status: Home / Self Care | Payer: Medicare Other | Source: Ambulatory Visit | Attending: Radiation Oncology | Admitting: Radiation Oncology

## 2013-06-19 ENCOUNTER — Other Ambulatory Visit: Payer: Self-pay | Admitting: Internal Medicine

## 2013-06-19 DIAGNOSIS — Z170 Estrogen receptor positive status [ER+]: Secondary | ICD-10-CM | POA: Diagnosis not present

## 2013-06-19 DIAGNOSIS — Z510 Encounter for antineoplastic radiation therapy: Secondary | ICD-10-CM | POA: Diagnosis not present

## 2013-06-19 DIAGNOSIS — C50919 Malignant neoplasm of unspecified site of unspecified female breast: Secondary | ICD-10-CM | POA: Diagnosis not present

## 2013-06-19 NOTE — Telephone Encounter (Signed)
We do not fill this medication as I do not provide care for schizophrenia and do not monitor this medication.  She needs to have it filled by her mental health provider.   Thanks!  EBM

## 2013-06-19 NOTE — Telephone Encounter (Signed)
Message left on home phone ID recording about note on med.

## 2013-06-19 NOTE — Telephone Encounter (Signed)
Called to pharm 

## 2013-06-20 ENCOUNTER — Ambulatory Visit
Admission: RE | Admit: 2013-06-20 | Discharge: 2013-06-20 | Disposition: A | Discharge status: Home / Self Care | Payer: Medicare Other | Source: Ambulatory Visit | Attending: Radiation Oncology | Admitting: Radiation Oncology

## 2013-06-20 DIAGNOSIS — C50319 Malignant neoplasm of lower-inner quadrant of unspecified female breast: Secondary | ICD-10-CM | POA: Diagnosis not present

## 2013-06-20 DIAGNOSIS — Z170 Estrogen receptor positive status [ER+]: Secondary | ICD-10-CM | POA: Diagnosis not present

## 2013-06-20 DIAGNOSIS — C50919 Malignant neoplasm of unspecified site of unspecified female breast: Secondary | ICD-10-CM | POA: Diagnosis not present

## 2013-06-20 DIAGNOSIS — Z510 Encounter for antineoplastic radiation therapy: Secondary | ICD-10-CM | POA: Diagnosis not present

## 2013-06-21 ENCOUNTER — Ambulatory Visit
Admission: RE | Admit: 2013-06-21 | Discharge: 2013-06-21 | Disposition: A | Discharge status: Home / Self Care | Payer: Medicare Other | Source: Ambulatory Visit | Attending: Radiation Oncology | Admitting: Radiation Oncology

## 2013-06-21 DIAGNOSIS — Z510 Encounter for antineoplastic radiation therapy: Secondary | ICD-10-CM | POA: Diagnosis not present

## 2013-06-21 DIAGNOSIS — Z170 Estrogen receptor positive status [ER+]: Secondary | ICD-10-CM | POA: Diagnosis not present

## 2013-06-21 DIAGNOSIS — C50919 Malignant neoplasm of unspecified site of unspecified female breast: Secondary | ICD-10-CM | POA: Diagnosis not present

## 2013-06-21 NOTE — Telephone Encounter (Signed)
Rx called in 

## 2013-06-22 ENCOUNTER — Encounter: Payer: Self-pay | Admitting: Radiation Oncology

## 2013-06-22 ENCOUNTER — Ambulatory Visit
Admission: RE | Admit: 2013-06-22 | Discharge: 2013-06-22 | Disposition: A | Discharge status: Home / Self Care | Payer: Medicare Other | Source: Ambulatory Visit | Attending: Radiation Oncology | Admitting: Radiation Oncology

## 2013-06-22 ENCOUNTER — Other Ambulatory Visit: Payer: Self-pay | Admitting: *Deleted

## 2013-06-22 VITALS — BP 115/76 | HR 97 | Temp 98.3°F | Resp 20 | Wt 165.9 lb

## 2013-06-22 DIAGNOSIS — K219 Gastro-esophageal reflux disease without esophagitis: Secondary | ICD-10-CM

## 2013-06-22 DIAGNOSIS — C50312 Malignant neoplasm of lower-inner quadrant of left female breast: Secondary | ICD-10-CM

## 2013-06-22 DIAGNOSIS — Z170 Estrogen receptor positive status [ER+]: Secondary | ICD-10-CM | POA: Diagnosis not present

## 2013-06-22 DIAGNOSIS — Z510 Encounter for antineoplastic radiation therapy: Secondary | ICD-10-CM | POA: Diagnosis not present

## 2013-06-22 DIAGNOSIS — C50919 Malignant neoplasm of unspecified site of unspecified female breast: Secondary | ICD-10-CM | POA: Diagnosis not present

## 2013-06-22 NOTE — Progress Notes (Signed)
Weekly rad txs 33/33 left chest wall, numerous places on skin peeling, dry, using radiaplex and neosporin, hydrogel pads, still burns but gel pads help, gave 1 month f/u appt card, but let patient know can call and be seen in 2 weeks if needed

## 2013-06-22 NOTE — Progress Notes (Signed)
   Department of Radiation Oncology  Phone:  [phone] Fax:        [phone]  Weekly Treatment Note    Name: Cynthia Griffin Date: 06/22/2013 MRN: [id number] DOB: 02-May-1946   Current dose: 60.4 Gy  Current fraction: 33   MEDICATIONS: Current Outpatient Prescriptions  Medication Sig Dispense Refill  . Biotin 5000 MCG CAPS Take 5,000 mcg by mouth daily.      . calcium citrate-vitamin D (CITRACAL+D) 315-200 MG-UNIT per tablet Take 1 tablet by mouth daily.      . citalopram (CELEXA) 40 MG tablet Take 40 mg by mouth daily.      . Glucosamine-Chondroit-Vit C-Mn (GLUCOSAMINE CHONDR 1500 COMPLX PO) Take 1 capsule by mouth daily.      . hyaluronate sodium (RADIAPLEXRX) GEL Apply 1 application topically 2 (two) times daily.      Marland Kitchen HYDROcodone-acetaminophen (NORCO) 5-325 MG per tablet Take 1-2 tablets by mouth every 4 (four) hours as needed for pain.  20 tablet  1  . LORazepam (ATIVAN) 0.5 MG tablet Take 0.5 mg by mouth at bedtime as needed for anxiety.      . Multiple Vitamin (MULTIVITAMIN WITH MINERALS) TABS Take 1 tablet by mouth daily.      . non-metallic deodorant Thornton Papas) MISC Apply 1 application topically daily as needed.       No current facility-administered medications for this encounter.     ALLERGIES: Sulfa antibiotics   LABORATORY DATA:  Lab Results  Component Value Date   WBC 3.3* 05/01/2013   HGB 11.4* 05/01/2013   HCT 34.7* 05/01/2013   MCV 91.8 05/01/2013   PLT 285 05/01/2013   Lab Results  Component Value Date   NA 143 05/01/2013   K 4.0 05/01/2013   CL 106 05/01/2013   CO2 28 05/01/2013   Lab Results  Component Value Date   ALT 23 02/21/2013   AST 15 02/21/2013   ALKPHOS 76 02/21/2013   BILITOT 0.30 02/21/2013     NARRATIVE: Cynthia Griffin was seen today for weekly treatment management. The chart was checked and the patient's films were reviewed. The patient finished her final fraction today. She has done well overall. She is glad to be done. She  is using hydrogel pads which have helped and also is using Neosporin ointment.  PHYSICAL EXAMINATION: weight is 165 lb 14.4 oz (75.252 kg). Her oral temperature is 98.3 F (36.8 C). Her blood pressure is 115/76 and her pulse is 97. Her respiration is 20.      diffuse dry desquamation is present. No moist desquamation.  ASSESSMENT: The patient did satisfactorily with treatment.  PLAN: Followup in one month.

## 2013-06-23 ENCOUNTER — Ambulatory Visit: Payer: Medicare Other | Admitting: Radiation Oncology

## 2013-06-23 MED ORDER — RANITIDINE HCL 150 MG PO CAPS: 150.0000 mg | ORAL_CAPSULE | Freq: Two times a day (BID) | ORAL | Status: DC

## 2013-06-27 ENCOUNTER — Telehealth: Payer: Self-pay | Admitting: Oncology

## 2013-06-27 ENCOUNTER — Other Ambulatory Visit: Payer: Self-pay | Admitting: Physician Assistant

## 2013-06-27 NOTE — Telephone Encounter (Signed)
, °

## 2013-07-05 ENCOUNTER — Telehealth: Payer: Self-pay | Admitting: Oncology

## 2013-07-05 ENCOUNTER — Ambulatory Visit: Payer: Medicare Other | Admitting: Oncology

## 2013-07-05 ENCOUNTER — Encounter: Payer: Self-pay | Admitting: Physician Assistant

## 2013-07-05 ENCOUNTER — Other Ambulatory Visit (HOSPITAL_BASED_OUTPATIENT_CLINIC_OR_DEPARTMENT_OTHER): Payer: Medicare Other | Admitting: Lab

## 2013-07-05 ENCOUNTER — Ambulatory Visit (HOSPITAL_BASED_OUTPATIENT_CLINIC_OR_DEPARTMENT_OTHER): Payer: Medicare Other | Admitting: Physician Assistant

## 2013-07-05 VITALS — BP 125/71 | HR 75 | Temp 98.4°F | Resp 18 | Ht 64.75 in | Wt 164.9 lb

## 2013-07-05 DIAGNOSIS — M2550 Pain in unspecified joint: Secondary | ICD-10-CM

## 2013-07-05 DIAGNOSIS — C50312 Malignant neoplasm of lower-inner quadrant of left female breast: Secondary | ICD-10-CM

## 2013-07-05 DIAGNOSIS — M25511 Pain in right shoulder: Secondary | ICD-10-CM | POA: Insufficient documentation

## 2013-07-05 DIAGNOSIS — C50319 Malignant neoplasm of lower-inner quadrant of unspecified female breast: Secondary | ICD-10-CM

## 2013-07-05 DIAGNOSIS — I890 Lymphedema, not elsewhere classified: Secondary | ICD-10-CM | POA: Diagnosis not present

## 2013-07-05 DIAGNOSIS — Z1231 Encounter for screening mammogram for malignant neoplasm of breast: Secondary | ICD-10-CM

## 2013-07-05 DIAGNOSIS — M25519 Pain in unspecified shoulder: Secondary | ICD-10-CM | POA: Diagnosis not present

## 2013-07-05 DIAGNOSIS — Z780 Asymptomatic menopausal state: Secondary | ICD-10-CM | POA: Diagnosis not present

## 2013-07-05 LAB — CBC WITH DIFFERENTIAL/PLATELET
BASO%: 0.2 % (ref 0.0–2.0)
EOS%: 0.8 % (ref 0.0–7.0)
HCT: 36.5 % (ref 34.8–46.6)
MCH: 28.3 pg (ref 25.1–34.0)
MCHC: 32.7 g/dL (ref 31.5–36.0)
MONO#: 0.5 10*3/uL (ref 0.1–0.9)
NEUT%: 57.4 % (ref 38.4–76.8)
RBC: 4.22 10*6/uL (ref 3.70–5.45)
RDW: 13.9 % (ref 11.2–14.5)
WBC: 4.7 10*3/uL (ref 3.9–10.3)
lymph#: 1.5 10*3/uL (ref 0.9–3.3)

## 2013-07-05 MED ORDER — TAMOXIFEN CITRATE 20 MG PO TABS: 20.0000 mg | ORAL_TABLET | Freq: Every day | ORAL | Status: DC

## 2013-07-05 MED ORDER — HYDROCODONE-ACETAMINOPHEN 5-325 MG PO TABS: 1.0000 | ORAL_TABLET | Freq: Three times a day (TID) | ORAL | Status: DC | PRN

## 2013-07-05 NOTE — Progress Notes (Signed)
ID: Cynthia Griffin   DOB: 13-Aug-1946  MR#: [id number]  WUJ#:[id number]  PCP: Kerby Nora, MD GYN:  SU: Almond Lint OTHER MD: Dorothy Puffer, Jeralyn Ruths, Etter Sjogren  ALERT: Another patient with the same date of birth, middle initial, and also with a history of breast cancer is in actinic. Please make sure when dealing with Cynthia Griffin that you have the correct medical record number   CHIEF COMPLAINT:  Left Breast Cancer    HISTORY OF PRESENT ILLNESS: The patient tells me she had an unremarkable screening mammogram January of 2013 in [place]. I do not have that report. Shortly after that, February of 2013, she noted that her left nipple had become inverted. She initially thought this was due to the mammogram itself, but eventually brought it to her primary care physician's attention. This was followed and there was no apparent change when she was next seen December 2013. The patient however became concerned and decided to go back to [facility] for her annual mammography This is where she had her annual mammograms until the last couple of years. Bilateral diagnostic mammography and left ultrasonography there 10/24/2012 showed definite nipple deformity with inversion and retraction. There was subtle architectural distortion and increase in density relative to the preceding study. Left breast ultrasonography in addition to the nipple retraction and inversion showed an area of hyperechoic architectural distortion measuring 1.4 cm in diameter with Doppler flow. Breast specific gamma imaging was obtained February 2024 and confirmed an area of moderate intensity isotope activity following a radial distribution from the left nipple measuring at least 4 cm. 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  Bilateral  breast MRI was obtained 10/31/2012 and showed an area of diffuse non-masslike asymmetric enhancement in the inferior aspect of the left breast measuring up to 7.4 cm transversely. There were no enlarged axillary or internal mammary nodes noted and the right breast was unremarkable.  The patient's subsequent history is as detailed below.  INTERVAL HISTORY: Cynthia Griffin returns alone today for followup of her left breast cancer.  Since her last appointment here in August, she underwent postmastectomy radiation to the left chest wall. This was completed in mid October, and she tolerated the radiation well with only some mild residual skin changes. She is ready to discuss antiestrogen therapy today.  At her last appointment in August, she and Dr. Darnelle Catalan discussed both anastrozole and tamoxifen, and she was given written information regarding both drugs. Cynthia Griffin has reviewed this information thoroughly, andwould like to start with tamoxifen.    Physically, Cynthia Griffin is feeling better. She is still having some joint stiffness in her hips and knees after sitting for prolonged period time, but this resolves with movement. She is trying to exercise more. She does note that she has had increased pain in the right shoulder, which is actually causing some limited range of motion due to the discomfort. She has a history of injury to the shoulder, but this has been at least 2 years ago. The pain had improved, but has worsened again over the last 1-2 months.  REVIEW OF SYSTEMS:  Cynthia Griffin has had no recent illnesses and denies any fevers or chills. She has an occasional hot flash, nothing particular problematic. Her energy level is good. Her appetite is good and she denies any nausea or change in bowel or  bladder habits. She's had no signs of abnormal bleeding. She's had no new cough, phlegm production, shortness of breath, chest pain, palpitations, or peripheral swelling. She denies any abnormal headaches or dizziness. 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 She occasionally has some sensations of numbness or tingling isolated to the left toes and the right fingertips.   A detailed review of systems today was otherwise noncontributory   PAST MEDICAL HISTORY: Past Medical History  Diagnosis Date  . Arthritis   . Anxiety   . Carpal tunnel syndrome of right wrist   . Family history of anesthesia complication     pneumothorax - post op 1980's  . Breast cancer 11/14/12    left, ER +, PR -, HER 2 -  . Pneumothorax on right March, 2014    while having port-a-cath placed  . Pneumonia   . 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 Anemia     PAST SURGICAL HISTORY: Past Surgical History  Procedure Laterality Date  . Appendectomy    . Tonsillectomy    . Meniscus repair      Right Knee  . Mastectomy w/ sentinel node biopsy Left 11/14/2012    Procedure: MASTECTOMY WITH SENTINEL LYMPH NODE BIOPSY;  Surgeon: Almond Lint, MD;  Location: [facility] OR;  Service: General;  Laterality: Left;  . Axillary lymph node dissection Left 12/01/2012    Procedure: AXILLARY LYMPH NODE DISSECTION;  Surgeon: Almond Lint, MD;  Location: [facility];  Service: General;  Laterality: Left;  . Portacath placement Right 12/01/2012    Procedure: INSERTION PORT-A-CATH;  Surgeon: Almond Lint, MD;  Location: [facility];  Service: General;  Laterality: Right;  . Abdominal hysterectomy  1992    fibroids, total  . Mastectomy Left December 20, 2012  . Eye surgery Bilateral 2006    lasik surgery  . Port-a-cath removal N/A 05/02/2013    Procedure: REMOVAL PORT-A-CATH;  Surgeon: Almond Lint, MD;  Location: [facility] OR;  Service: General;  Laterality: N/A;   right shoulder rotator cuff repair, right hand carpal tunnel syndrome, bilateral salpingo--oophorectomy with her hysterectomy in the 1990s, Lasix surgery, right toe surgery  FAMILY HISTORY Family History  Problem Relation Age of Onset  . Bladder  Cancer Father 58  . Lung cancer Father 76  . Diabetes Father   . Bone cancer Paternal Uncle     ? late 26s  . Diabetes Mother   . Peripheral vascular disease Mother   . Multiple sclerosis Sister   . Parkinson's disease Paternal Grandmother   . Heart disease Paternal Grandfather    the patient's father died at the age of 54. He was diagnosed with lung cancer the age of 7 and with bladder cancer at the age of 67. He was a smoker. The patient's mother is living at age 45. She has a history of basal cell carcinoma. Cynthia Griffin had no brothers, one sister. There is no history of breast or ovarian cancer in the immediate family.  GYNECOLOGIC HISTORY: Menarche age 42, first live birth age 69, she is GX P2. She had her hysterectomy she thinks in 1992, and took hormone replacement for approximately 18 years. In addition she took birth control pills between 1968 and 1977 stopping only to conceive.  SOCIAL HISTORY: She is a retired Environmental health practitioner. Her husband Kerrington Sova (goes by Fredrik Cove) is in Airline pilot for at [employer] in [place]. Son Yareth Kearse lives in  [place] and works in Chief Financial Officer. Son R.Leiyah Maultsby II lives in [place] and works in Airline pilot. The patient has 5 grandchildren" one on the way".   ADVANCED DIRECTIVES: In place  HEALTH MAINTENANCE:  (Dated 07/05/2013) History  Substance Use Topics  . Smoking status: Former Smoker -- 1.25 packs/day for 29 years    Types: Cigarettes    Quit date: 09/07/1992  . Smokeless tobacco: Never Used  . Alcohol Use: 4.2 oz/week    7 Glasses of wine per week     Colonoscopy: August 2003  PAP: Status post hysterectomy  Bone density: Never - scheduled for Feb 2015  Lipid panel: Dr. Ermalene Searing  Allergies  Allergen Reactions  . Sulfa Antibiotics Nausea Only and Other (See Comments)    Stomach cramps    Current Outpatient Prescriptions  Medication Sig Dispense Refill  . Biotin 5000 MCG CAPS Take 5,000 mcg by mouth  daily.      . calcium citrate-vitamin D (CITRACAL+D) 315-200 MG-UNIT per tablet Take 1 tablet by mouth daily.      . citalopram (CELEXA) 40 MG tablet Take 40 mg by mouth daily.      . Coenzyme Q10 (CO Q10) 100 MG CAPS Take 200 mg by mouth daily.      . Glucosamine-Chondroit-Vit C-Mn (GLUCOSAMINE CHONDR 1500 COMPLX PO) Take 1 capsule by mouth daily.      . hyaluronate sodium (RADIAPLEXRX) GEL Apply 1 application topically 2 (two) times daily.      Marland Kitchen HYDROcodone-acetaminophen (NORCO) 5-325 MG per tablet Take 1-2 tablets by mouth every 8 (eight) hours as needed for pain.  30 tablet  0  . Multiple Vitamin (MULTIVITAMIN WITH MINERALS) TABS Take 1 tablet by mouth daily.      . non-metallic deodorant Thornton Papas) MISC Apply 1 application topically daily as needed.      Marland Kitchen LORazepam (ATIVAN) 0.5 MG tablet Take 0.5 mg by mouth at bedtime as needed for anxiety.      . tamoxifen (NOLVADEX) 20 MG tablet Take 1 tablet (20 mg total) by mouth daily.  30 tablet  3   No current facility-administered medications for this visit.    OBJECTIVE: Middle-aged white woman who appears well and is in no acute distress Filed Vitals:   07/05/13 1140  BP: 125/71  Pulse: 75  Temp: 98.4 F (36.9 C)  Resp: 18     Body mass index is 27.64 kg/(m^2).    ECOG FS: 0 and Filed Weights   07/05/13 1140  Weight: 164 lb 14.4 oz (74.798 kg)   Physical Exam: HEENT:  Sclerae anicteric.  Oropharynx clear. Good dentition. NODES:  No cervical or supraclavicular lymphadenopathy palpated.  BREAST EXAM:  Right breast is unremarkable. Patient is status post left mastectomy and radiation therapy. There is some residual skin changes secondary to radiation, but no evidence of local recurrence. Axillae are benign bilaterally, no palpable lymphadenopathy. LUNGS:  Clear to auscultation bilaterally.  No wheezes or rhonchi HEART:  Regular rate and rhythm. No murmur  ABDOMEN:  Soft, nontender.  Positive bowel sounds.  MSK:  No focal spinal  tenderness to palpation.  Range of motion is slightly limited in the right shoulder secondary to discomfort. There is no tenderness to palpation or swelling noted in the right shoulder.  EXTREMITIES:  No peripheral edema.   NEURO:  Nonfocal. Well oriented.  Positive affect.   LAB RESULTS: Lab Results  Component Value Date   WBC 4.7 07/05/2013   NEUTROABS 2.7 07/05/2013   HGB 12.0 07/05/2013  HCT 36.5 07/05/2013   MCV 86.5 07/05/2013   PLT 218 07/05/2013   CMP     Component Value Date/Time   NA 143 05/01/2013 1048   NA 139 02/21/2013 1028   K 4.0 05/01/2013 1048   K 3.8 02/21/2013 1028   CL 106 05/01/2013 1048   CL 107 02/21/2013 1028   CO2 28 05/01/2013 1048   CO2 23 02/21/2013 1028   GLUCOSE 69* 05/01/2013 1048   GLUCOSE 97 02/21/2013 1028   BUN 11 05/01/2013 1048   BUN 15.7 02/21/2013 1028   CREATININE 0.76 05/01/2013 1048   CREATININE 0.7 02/21/2013 1028   CALCIUM 9.4 05/01/2013 1048   CALCIUM 9.4 02/21/2013 1028   PROT 6.8 02/21/2013 1028   ALBUMIN 3.3* 02/21/2013 1028   AST 15 02/21/2013 1028   ALT 23 02/21/2013 1028   ALKPHOS 76 02/21/2013 1028   BILITOT 0.30 02/21/2013 1028   GFRNONAA 87* 05/01/2013 1048   GFRAA >90 05/01/2013 1048    STUDIES: No results found.  Patient is due for her next right screening mammogram at [facility] in February, and will have a baseline bone density exam at the same time.  ASSESSMENT: 67 y.o. Cynthia Griffin woman   (1)  status post left mastectomy and axillary lymph node dissection March 2014 for a pT3 pN2, stage IIIA invasive lobular carcinoma, grade 1, estrogen receptor 100% positive, progesterone receptor and HER-2 negative, with an MIB-1 of 8%.  (2) R pneumothorax with port placement March 2014- resolved  (3)  treated in the adjuvant setting with docetaxel/ doxorubicin/ cyclophosphamide, the original plan being to complete 6 q. three-week doses, with Neulasta given on day 2 for granulocyte support  (4) switched to carboplatin and gemcitabine given day  1 only for cycles 5 and 6 of chemotherapy because of concerns regarding developing neuropathy; completed 04/04/2013  (5)  lymphedema, left upper extremity, grade 1  (6) status post postmastectomy radiation, completed mid October  (7)  starting on tamoxifen, 20 mg daily, in late October 2014  (8)   Reconstruction also to follow approximately 6 months after radiation has been completed.  (9)  increasing pain in the right shoulder causing limited range of motion   PLAN:  We again reviewed the similarities and differences between anastrozole and tamoxifen today. Cynthia Griffin feels more comfortable beginning with tamoxifen, and is ready to initiate therapy now. She  was given a prescription today for tamoxifen, 20 mg daily. We again reviewed all possible side effects and toxicities associated with the tamoxifen. She previously had no problem with hormone replacement therapy, and has no history of blood clots. She's also status post hysterectomy, so we'll not have to be concerned with the risk of uterine cancer. She has annual eye exams and has no history of cataracts. I will plan on seeing her back in 6-8 weeks to assess her tolerance to the new medication.  As noted above, Cynthia Griffin is due for her next right mammogram in February, and we will also obtain a baseline bone density at that time as well. She will see Dr. Darnelle Catalan soon thereafter in March, and if she is doing well, at that point we will initiate q. 6 month visits, alternating appointments with Dr. Rowan Blase.   I am also ordering an MRI of the right shoulder for further evaluation of her pain and limited range of motion. Depending on those results, we will consider an orthopedic referral, perhaps to Dr. Francena Hanly, for further evaluation. I did refill roses hydrocodone/APAP today which she  takes occasionally for the shoulder pain, especially to help her sleep at night when it bothers her the most.  Cynthia Griffin voices understanding and agreement with the above  plan. She knows to call with any changes or problems prior to her next scheduled appointment.   Cynthia Yebra PA-C    07/05/2013

## 2013-07-05 NOTE — Telephone Encounter (Signed)
, °

## 2013-07-09 NOTE — Progress Notes (Signed)
  Radiation Oncology         [phone]) [phone] ________________________________  Name: Cynthia Griffin MRN: [id number]  Date: 06/22/2013  DOB: 03/12/1946  End of Treatment Note  Diagnosis:   Invasive mammary carcinoma of the left breast     Indication for treatment:  Curative       Radiation treatment dates:   05/09/2013 through 06/22/2013  Site/dose:   The patient was treated to the left chest wall and left supraclavicular region initially to a dose of 50.4 gray. This was accomplished using a 4 field technique. The patient then received an additional 10 gray using an en face electron field to the mastectomy scar. The patient's total dose was 60.4 gray.  Narrative: The patient tolerated radiation treatment relatively well.   The patient exhibited some skin irritation towards the end of treatment. No significant moist desquamation.  Plan: The patient has completed radiation treatment. The patient will return to radiation oncology clinic for routine followup in one month. I advised the patient to call or return sooner if they have any questions or concerns related to their recovery or treatment. ________________________________  Radene Gunning, M.D., Ph.D.

## 2013-07-09 NOTE — Progress Notes (Signed)
  Radiation Oncology         [phone]) [phone] ________________________________  Name: Cynthia Griffin MRN: [id number]  Date: 06/14/2013  DOB: October 25, 1945  Complex simulation note  The patient has undergone complex simulation for her upcoming boost treatment for her diagnosis of breast cancer. The patient has initially been planned to receive 50.4 gray. The patient will now receive a 10 gray boost to the mastectomy scar which has been contoured. This will be accomplished using an en face electron field. Based on the depth of the target area, 6 MeV electrons will be used and this field has been normalized to the 95% isodose line. The patient's final total dose therefore will be 60.4 gray. A special port plan is requested for the boost treatment.   _______________________________  Radene Gunning, MD, PhD

## 2013-07-12 ENCOUNTER — Ambulatory Visit (HOSPITAL_COMMUNITY)
Admission: RE | Admit: 2013-07-12 | Discharge: 2013-07-12 | Disposition: A | Discharge status: Home / Self Care | Payer: Medicare Other | Source: Ambulatory Visit | Attending: Physician Assistant | Admitting: Physician Assistant

## 2013-07-12 DIAGNOSIS — M7530 Calcific tendinitis of unspecified shoulder: Secondary | ICD-10-CM | POA: Diagnosis not present

## 2013-07-12 DIAGNOSIS — S46819A Strain of other muscles, fascia and tendons at shoulder and upper arm level, unspecified arm, initial encounter: Secondary | ICD-10-CM | POA: Insufficient documentation

## 2013-07-12 DIAGNOSIS — C50312 Malignant neoplasm of lower-inner quadrant of left female breast: Secondary | ICD-10-CM

## 2013-07-12 DIAGNOSIS — C50919 Malignant neoplasm of unspecified site of unspecified female breast: Secondary | ICD-10-CM | POA: Insufficient documentation

## 2013-07-12 DIAGNOSIS — X58XXXA Exposure to other specified factors, initial encounter: Secondary | ICD-10-CM | POA: Insufficient documentation

## 2013-07-12 DIAGNOSIS — M25511 Pain in right shoulder: Secondary | ICD-10-CM

## 2013-07-12 LAB — CREATININE, SERUM
GFR calc Af Amer: 76 mL/min — ABNORMAL LOW (ref >90)
GFR calc non Af Amer: 66 mL/min — ABNORMAL LOW (ref >90)

## 2013-07-12 MED ORDER — GADOBENATE DIMEGLUMINE 529 MG/ML IV SOLN: 15.0000 mL | Freq: Once | INTRAVENOUS | Status: AC | PRN

## 2013-07-13 ENCOUNTER — Other Ambulatory Visit: Payer: Self-pay | Admitting: Physician Assistant

## 2013-07-13 ENCOUNTER — Other Ambulatory Visit: Payer: Self-pay

## 2013-07-13 ENCOUNTER — Other Ambulatory Visit: Payer: Self-pay | Admitting: *Deleted

## 2013-07-13 DIAGNOSIS — E119 Type 2 diabetes mellitus without complications: Secondary | ICD-10-CM

## 2013-07-13 DIAGNOSIS — I10 Essential (primary) hypertension: Secondary | ICD-10-CM

## 2013-07-13 DIAGNOSIS — Z0000 Encounter for general adult medical examination without abnormal findings: Secondary | ICD-10-CM

## 2013-07-13 DIAGNOSIS — M25511 Pain in right shoulder: Secondary | ICD-10-CM

## 2013-07-14 MED ORDER — CALCIUM CARBONATE-VITAMIN D 500-200 MG-UNIT PO TABS: 1.0000 | ORAL_TABLET | Freq: Three times a day (TID) | ORAL | Status: DC

## 2013-07-14 MED ORDER — METFORMIN HCL 1000 MG PO TABS: 1000.0000 mg | ORAL_TABLET | Freq: Two times a day (BID) | ORAL | Status: DC

## 2013-07-14 MED ORDER — PEN NEEDLES 31G X 6 MM MISC: Status: DC

## 2013-07-14 MED ORDER — LANCETS MISC: Status: DC

## 2013-07-14 MED ORDER — ASPIRIN 81 MG PO TABS: 81.0000 mg | ORAL_TABLET | Freq: Every day | ORAL | Status: AC

## 2013-07-14 MED ORDER — GLUCOSE BLOOD VI STRP: ORAL_STRIP | Status: DC

## 2013-07-14 MED ORDER — LISINOPRIL 5 MG PO TABS: 5.0000 mg | ORAL_TABLET | Freq: Every day | ORAL | Status: DC

## 2013-07-18 ENCOUNTER — Telehealth: Payer: Self-pay | Admitting: Oncology

## 2013-07-18 NOTE — Telephone Encounter (Signed)
S/w pt re appt w/Dr. Rennis Chris Saturday 07/22/13 @ 10am. Pt given appt d/t/location and number to contact [facility] rad for disc of mri. Pt aware Dr. Rennis Chris will need her to bring disc - mri w/her to appt. Fax coversheet sent to HIM to send notes to Dr. Rennis Chris @ [phone]. No other orders.

## 2013-07-21 ENCOUNTER — Telehealth: Payer: Self-pay | Admitting: Oncology

## 2013-07-21 NOTE — Telephone Encounter (Signed)
Faxed pt medical records to Dr. Rennis Chris

## 2013-07-31 ENCOUNTER — Other Ambulatory Visit: Payer: Self-pay | Admitting: Internal Medicine

## 2013-08-02 DIAGNOSIS — S43429A Sprain of unspecified rotator cuff capsule, initial encounter: Secondary | ICD-10-CM | POA: Diagnosis not present

## 2013-08-07 ENCOUNTER — Encounter: Payer: Self-pay | Admitting: Radiation Oncology

## 2013-08-10 ENCOUNTER — Encounter: Payer: Self-pay | Admitting: Radiation Oncology

## 2013-08-10 ENCOUNTER — Ambulatory Visit
Admission: RE | Admit: 2013-08-10 | Discharge: 2013-08-10 | Disposition: A | Discharge status: Home / Self Care | Payer: Medicare Other | Source: Ambulatory Visit | Attending: Radiation Oncology | Admitting: Radiation Oncology

## 2013-08-10 VITALS — BP 128/59 | HR 67 | Temp 98.5°F | Resp 20 | Wt 165.7 lb

## 2013-08-10 DIAGNOSIS — C50312 Malignant neoplasm of lower-inner quadrant of left female breast: Secondary | ICD-10-CM

## 2013-08-10 HISTORY — DX: Personal history of irradiation: Z92.3

## 2013-08-10 NOTE — Progress Notes (Signed)
Radiation Oncology         [phone]) [phone] ________________________________  Name: Cynthia Griffin MRN: [id number]  Date: 08/10/2013  DOB: 1945-10-21  Follow-Up Visit Note  CC: Kerby Nora, MD  Excell Seltzer, MD  Diagnosis:   Left-sided breast cancer  Interval Since Last Radiation:  Approximately one month   Narrative:  The patient returns today for routine follow-up.  She has done well overall since she finished treatment. The patient's skin has healed significantly since she completed her course of radiation treatment. She has begun anti-hormonal treatment.                              ALLERGIES:  is allergic to sulfa antibiotics.  Meds: Current Outpatient Prescriptions  Medication Sig Dispense Refill  . Biotin 5000 MCG CAPS Take 5,000 mcg by mouth daily.      . calcium citrate-vitamin D (CITRACAL+D) 315-200 MG-UNIT per tablet Take 1 tablet by mouth daily.      . citalopram (CELEXA) 40 MG tablet Take 40 mg by mouth daily.      . Glucosamine-Chondroit-Vit C-Mn (GLUCOSAMINE CHONDR 1500 COMPLX PO) Take 1 capsule by mouth daily.      Marland Kitchen HYDROcodone-acetaminophen (NORCO) 5-325 MG per tablet Take 1-2 tablets by mouth every 8 (eight) hours as needed for pain.  30 tablet  0  . Multiple Vitamin (MULTIVITAMIN WITH MINERALS) TABS Take 1 tablet by mouth daily.      . non-metallic deodorant Thornton Papas) MISC Apply 1 application topically daily as needed.      . tamoxifen (NOLVADEX) 20 MG tablet Take 1 tablet (20 mg total) by mouth daily.  30 tablet  3  . Coenzyme Q10 (CO Q10) 100 MG CAPS Take 200 mg by mouth daily.      . hyaluronate sodium (RADIAPLEXRX) GEL Apply 1 application topically 2 (two) times daily.       No current facility-administered medications for this encounter.    Physical Findings: The patient is in no acute distress. Patient is alert and oriented.  weight is 165 lb 11.2 oz (75.161 kg). Her oral temperature is 98.5 F (36.9 C). Her blood pressure is 128/59 and her pulse is 67.  Her respiration is 20. .   The skin in the treatment area has healed satisfactorily, no areas of concern/moist desquamation/poor healing  Lab Findings: Lab Results  Component Value Date   WBC 4.7 07/05/2013   HGB 12.0 07/05/2013   HCT 36.5 07/05/2013   MCV 86.5 07/05/2013   PLT 218 07/05/2013     Radiographic Findings: Mr Shoulder Right W Wo Contrast  07/12/2013   CLINICAL DATA:  Increasing right shoulder pain with limited range of motion. History of breast cancer.  EXAM: MRI OF THE RIGHT SHOULDER WITHOUT AND WITH CONTRAST  TECHNIQUE: Multiplanar, multisequence MR imaging was performed both before and after administration of intravenous contrast.  CONTRAST:  15 ml MultiHance.  COMPARISON:  PET-CT 12/13/2012. Chest CT 12/02/2012.  FINDINGS: Rotator cuff: There is a full thickness insertional tear of the supraspinatus tendon with approximately 2 cm of tendon retraction on the coronal images. This tear is nearly complete. The infraspinous tendon is intact with mild tendinosis. The subscapularis and teres minor tendons are intact.  Muscles:  No focal muscular atrophy, edema or abnormal enhancement.  Biceps long head:  Intact and normally positioned.  Acromioclavicular Joint: The acromion is type 2. There are mild acromioclavicular degenerative changes. There is a  small amount of fluid in the subacromial -subdeltoid bursa. Following contrast, there is enhancement within that bursa.  Glenohumeral Joint: No significant shoulder joint effusion or glenohumeral arthropathy.  Labrum:  No evidence of labral tear.  Bones: No evidence of osseous or axillary metastatic disease. Mildly heterogeneous signal within the proximal humeral metaphysis likely represents an incidental chondroid lesion. This demonstrates no aggressive characteristics.  IMPRESSION: 1. No evidence of metastatic disease to the bones or soft tissues of the right shoulder region. 2. Full thickness supraspinatus tendon tear with associated synovial  enhancement. No significant focal muscular atrophy. 3. No evidence of labral or biceps tendon tear.   Electronically Signed   By: Roxy Horseman M.D.   On: 07/12/2013 16:07    Impression:    The patient has done satisfactorily since finishing treatment. She has begun anti-hormonal treatment.  Plan:  The patient will followup in our clinic on a when necessary basis.   Radene Gunning, M.D., Ph.D.

## 2013-08-10 NOTE — Progress Notes (Signed)
Follow up  Rad tx left chest wall/breast, 05/09/13-06/22/13,  Well healed , no c/o pain, some fatigue at times, tamoxifen 20mg  daily po, last mammogram 10/24/12, next due 2015 11:12 AM

## 2013-09-04 ENCOUNTER — Other Ambulatory Visit (HOSPITAL_BASED_OUTPATIENT_CLINIC_OR_DEPARTMENT_OTHER): Payer: Medicare Other

## 2013-09-04 ENCOUNTER — Telehealth: Payer: Self-pay | Admitting: Oncology

## 2013-09-04 ENCOUNTER — Encounter: Payer: Self-pay | Admitting: Physician Assistant

## 2013-09-04 ENCOUNTER — Ambulatory Visit (HOSPITAL_BASED_OUTPATIENT_CLINIC_OR_DEPARTMENT_OTHER): Payer: Medicare Other | Admitting: Physician Assistant

## 2013-09-04 VITALS — BP 124/74 | HR 86 | Temp 98.7°F | Resp 20 | Ht 64.75 in | Wt 165.1 lb

## 2013-09-04 DIAGNOSIS — M25559 Pain in unspecified hip: Secondary | ICD-10-CM | POA: Diagnosis not present

## 2013-09-04 DIAGNOSIS — I890 Lymphedema, not elsewhere classified: Secondary | ICD-10-CM | POA: Diagnosis not present

## 2013-09-04 DIAGNOSIS — Z1231 Encounter for screening mammogram for malignant neoplasm of breast: Secondary | ICD-10-CM

## 2013-09-04 DIAGNOSIS — Z170 Estrogen receptor positive status [ER+]: Secondary | ICD-10-CM | POA: Diagnosis not present

## 2013-09-04 DIAGNOSIS — R61 Generalized hyperhidrosis: Secondary | ICD-10-CM

## 2013-09-04 DIAGNOSIS — C50312 Malignant neoplasm of lower-inner quadrant of left female breast: Secondary | ICD-10-CM

## 2013-09-04 DIAGNOSIS — C50319 Malignant neoplasm of lower-inner quadrant of unspecified female breast: Secondary | ICD-10-CM

## 2013-09-04 DIAGNOSIS — M25511 Pain in right shoulder: Secondary | ICD-10-CM

## 2013-09-04 LAB — CBC WITH DIFFERENTIAL/PLATELET
BASO%: 0.2 % (ref 0.0–2.0)
Eosinophils Absolute: 0.1 10*3/uL (ref 0.0–0.5)
HGB: 12.1 g/dL (ref 11.6–15.9)
MONO#: 0.4 10*3/uL (ref 0.1–0.9)
NEUT#: 4.2 10*3/uL (ref 1.5–6.5)
Platelets: 187 10*3/uL (ref 145–400)
RBC: 4.25 10*6/uL (ref 3.70–5.45)
RDW: 13.9 % (ref 11.2–14.5)
WBC: 5.5 10*3/uL (ref 3.9–10.3)
lymph#: 0.8 10*3/uL — ABNORMAL LOW (ref 0.9–3.3)

## 2013-09-04 LAB — COMPREHENSIVE METABOLIC PANEL (CC13)
ALT: 19 U/L (ref 0–55)
AST: 17 U/L (ref 5–34)
Albumin: 3.5 g/dL (ref 3.5–5.0)
Anion Gap: 9 mEq/L (ref 3–11)
CO2: 27 mEq/L (ref 22–29)
Creatinine: 0.8 mg/dL (ref 0.6–1.1)
Glucose: 91 mg/dl (ref 70–140)
Potassium: 4.3 mEq/L (ref 3.5–5.1)
Sodium: 142 mEq/L (ref 136–145)
Total Bilirubin: 0.41 mg/dL (ref 0.20–1.20)
Total Protein: 6.5 g/dL (ref 6.4–8.3)

## 2013-09-04 NOTE — Telephone Encounter (Signed)
, °

## 2013-09-04 NOTE — Progress Notes (Signed)
ID: Cynthia Griffin   DOB: 09/12/1945  MR#: [id number]  WUJ#:[id number]  PCP: Kerby Nora, MD GYN:  SU: Almond Lint OTHER MD: Dorothy Puffer, Jeralyn Ruths, Etter Sjogren  ALERT: Another patient with the same date of birth, middle initial, and also with a history of breast cancer is in actinic. Please make sure when dealing with Cynthia Griffin that you have the correct medical record number   CHIEF COMPLAINT:  Hx of Left Breast Cancer  HISTORY OF PRESENT ILLNESS: The patient tells me she had an unremarkable screening mammogram January of 2013 in [place]. I do not have that report. Shortly after that, February of 2013, she noted that her left nipple had become inverted. She initially thought this was due to the mammogram itself, but eventually brought it to her primary care physician's attention. This was followed and there was no apparent change when she was next seen December 2013. The patient however became concerned and decided to go back to [facility] for her annual mammography This is where she had her annual mammograms until the last couple of years. Bilateral diagnostic mammography and left ultrasonography there 10/24/2012 showed definite nipple deformity with inversion and retraction. There was subtle architectural distortion and increase in density relative to the preceding study. Left breast ultrasonography in addition to the nipple retraction and inversion showed an area of hyperechoic architectural distortion measuring 1.4 cm in diameter with Doppler flow. Breast specific gamma imaging was obtained February 2024 and confirmed an area of moderate intensity isotope activity following a radial distribution from the left nipple measuring at least 4 cm. 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  Bilateral breast MRI was obtained 10/31/2012 and showed an area of diffuse non-masslike asymmetric enhancement in the inferior aspect of the left breast measuring up to 7.4 cm transversely. There were no enlarged axillary or internal mammary nodes noted and the right breast was unremarkable.   The patient's subsequent history is as detailed below.  INTERVAL HISTORY: Cynthia Griffin returns alone today for followup of her left breast cancer.  Since her last appointment here, she started her tamoxifen in late October. She's tolerating the medication well, her biggest complaint being some increased hot flashes. These occur occasionally during the day, but are more frequent at night. Thus far she has not find them particularly problematic. She's had no vaginal changes, specifically no increased dryness, discharge, or abnormal bleeding. Her energy level is good, and overall she is feeling well.  Cynthia Griffin continues to have pain in the right shoulder for which she underwent a shoulder MRI in early November. She's been followed by Dr. Rennis Chris, and is scheduled for surgery next Friday, January 9.    REVIEW OF SYSTEMS:  Cynthia Griffin has had no recent illnesses and denies any fevers or chills. She denies any skin changes or rashes and has had no abnormal bruising, bleeding, or evidence of clotting.  Her appetite is good and she denies any nausea or change in bowel or bladder habits.  She's had no new cough, phlegm production, shortness of breath, chest pain, palpitations, or peripheral swelling. She denies any abnormal headaches, change in vision or dizziness. 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 She has some occasional postsurgical pain in the left chest wall and left axillary region. She denies any additional myalgias, arthralgias, or bony pain.  A detailed review of systems today was otherwise noncontributory   PAST MEDICAL HISTORY: Past Medical History  Diagnosis Date  . Arthritis   . Anxiety   . Carpal  tunnel syndrome of right wrist   . Family history of anesthesia complication     pneumothorax - post op 1980's  . Breast cancer 11/14/12    left, ER +, PR -, HER 2 -  . Pneumothorax on right March, 2014    while having port-a-cath placed  . Pneumonia   . 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 Anemia   . Hx of radiation therapy 05/09/13-06/22/13    left breast 60.4Gy    PAST SURGICAL HISTORY: Past Surgical History  Procedure Laterality Date  . Appendectomy    . Tonsillectomy    . Meniscus repair      Right Knee  . Mastectomy w/ sentinel node biopsy Left 11/14/2012    Procedure: MASTECTOMY WITH SENTINEL LYMPH NODE BIOPSY;  Surgeon: Almond Lint, MD;  Location: [facility] OR;  Service: General;  Laterality: Left;  . Axillary lymph node dissection Left 12/01/2012    Procedure: AXILLARY LYMPH NODE DISSECTION;  Surgeon: Almond Lint, MD;  Location: [facility];  Service: General;  Laterality: Left;  . Portacath placement Right 12/01/2012    Procedure: INSERTION PORT-A-CATH;  Surgeon: Almond Lint, MD;  Location: [facility];  Service: General;  Laterality: Right;  . Abdominal hysterectomy  1992    fibroids, total  . Mastectomy Left December 20, 2012  . Eye surgery Bilateral 2006    lasik surgery  . Port-a-cath removal N/A 05/02/2013    Procedure: REMOVAL PORT-A-CATH;  Surgeon: Almond Lint, MD;  Location: [facility] OR;  Service: General;  Laterality: N/A;   right shoulder rotator cuff repair, right hand carpal tunnel syndrome, bilateral salpingo--oophorectomy with her hysterectomy in the 1990s, Lasix surgery, right toe surgery  FAMILY HISTORY Family History  Problem Relation Age of Onset  . Bladder Cancer Father 16  . Lung cancer Father 44  . Diabetes Father   . Bone cancer Paternal Uncle     ? late 46s  . Diabetes Mother   . Peripheral vascular disease Mother   . Multiple sclerosis Sister   . Parkinson's disease Paternal Grandmother   . Heart  disease Paternal Grandfather    the patient's father died at the age of 23. He was diagnosed with lung cancer the age of 82 and with bladder cancer at the age of 89. He was a smoker. The patient's mother is living at age 3. She has a history of basal cell carcinoma. Latecia had no brothers, one sister. There is no history of breast or ovarian cancer in the immediate family.  GYNECOLOGIC HISTORY: Menarche age 66, first live birth age 78, she is GX P2. She had her hysterectomy she thinks in 1992, and took hormone replacement for approximately 18 years. In addition she took birth control pills between 1968 and 1977 stopping only to conceive.  SOCIAL HISTORY: She is a retired Environmental health practitioner. Her husband Cynthia Pardo (goes by Fredrik Cove) is in Airline pilot for at [employer] in [place]. Son Cynthia Griffin lives in [place] and works in Chief Financial Officer. Son Cynthia Griffin lives in [place] and works in Airline pilot. The patient has 5 grandchildren" one on the way".   ADVANCED DIRECTIVES: In place  HEALTH MAINTENANCE:  (Updated 09/04/2013) History  Substance Use Topics  . Smoking status:  Former Smoker -- 1.25 packs/day for 29 years    Types: Cigarettes    Quit date: 09/07/1992  . Smokeless tobacco: Never Used  . Alcohol Use: 4.2 oz/week    7 Glasses of wine per week     Colonoscopy: August 2003  PAP: Status post hysterectomy  Bone density: Never - scheduled for Feb 2015  Lipid panel: Dr. Ermalene Searing   Allergies  Allergen Reactions  . Sulfa Antibiotics Nausea Only and Other (See Comments)    Stomach cramps    Current Outpatient Prescriptions  Medication Sig Dispense Refill  . Biotin 5000 MCG CAPS Take 5,000 mcg by mouth daily.      . calcium citrate-vitamin D (CITRACAL+D) 315-200 MG-UNIT per tablet Take 1 tablet by mouth daily.      . citalopram (CELEXA) 40 MG tablet Take 40 mg by mouth daily.      . Glucosamine-Chondroit-Vit C-Mn (GLUCOSAMINE CHONDR 1500 COMPLX PO) Take  1 capsule by mouth daily.      Marland Kitchen HYDROcodone-acetaminophen (NORCO) 5-325 MG per tablet Take 1-2 tablets by mouth every 8 (eight) hours as needed for pain.  30 tablet  0  . Multiple Vitamin (MULTIVITAMIN WITH MINERALS) TABS Take 1 tablet by mouth daily.      . non-metallic deodorant Thornton Papas) MISC Apply 1 application topically daily as needed.      . ranitidine (ZANTAC) 150 MG tablet       . tamoxifen (NOLVADEX) 20 MG tablet Take 1 tablet (20 mg total) by mouth daily.  30 tablet  3   No current facility-administered medications for this visit.    OBJECTIVE: Middle-aged white woman who appears well and is in no acute distress Filed Vitals:   09/04/13 1117  BP: 124/74  Pulse: 86  Temp: 98.7 F (37.1 C)  Resp: 20     Body mass index is 27.67 kg/(m^2).    ECOG FS: 0  Filed Weights   09/04/13 1117  Weight: 165 lb 1.6 oz (74.889 kg)   Physical Exam: HEENT:  Sclerae anicteric.  Oropharynx clear and moist. Good dentition. Neck is supple. NODES:  No cervical or supraclavicular lymphadenopathy palpated.  BREAST EXAM:  Deferred. Axillae are benign bilaterally, no palpable lymphadenopathy. LUNGS:  Clear to auscultation bilaterally with good excursion.  No wheezes or rhonchi HEART:  Regular rate and rhythm. No murmur  ABDOMEN:  Soft, nontender to palpation.  Positive, normoactive bowel sounds.  MSK:  No focal spinal tenderness to palpation.  Range of motion remains limited in the right shoulder secondary to discomfort. There is no tenderness to palpation or swelling noted in the right shoulder. Range of motion in the left upper extremity. EXTREMITIES:  No peripheral edema.   NEURO:  Nonfocal. Well oriented.  Positive affect.   LAB RESULTS: Lab Results  Component Value Date   WBC 5.5 09/04/2013   NEUTROABS 4.2 09/04/2013   HGB 12.1 09/04/2013   HCT 37.7 09/04/2013   MCV 88.7 09/04/2013   PLT 187 09/04/2013   CMP     Component Value Date/Time   NA 142 09/04/2013 1049   NA 143 05/01/2013  1048   K 4.3 09/04/2013 1049   K 4.0 05/01/2013 1048   CL 106 05/01/2013 1048   CL 107 02/21/2013 1028   CO2 27 09/04/2013 1049   CO2 28 05/01/2013 1048   GLUCOSE 91 09/04/2013 1049   GLUCOSE 69* 05/01/2013 1048   GLUCOSE 97 02/21/2013 1028   BUN 13.9 09/04/2013 1049   BUN 11 05/01/2013  1048   CREATININE 0.8 09/04/2013 1049   CREATININE 0.90 07/12/2013 1400   CALCIUM 8.9 09/04/2013 1049   CALCIUM 9.4 05/01/2013 1048   PROT 6.5 09/04/2013 1049   ALBUMIN 3.5 09/04/2013 1049   AST 17 09/04/2013 1049   ALT 19 09/04/2013 1049   ALKPHOS 50 09/04/2013 1049   BILITOT 0.41 09/04/2013 1049   GFRNONAA 66* 07/12/2013 1400   GFRAA 76* 07/12/2013 1400    STUDIES: No results found.  Patient is due for her next right screening mammogram at [facility] in February, and will have a baseline bone density exam at the same time.    ASSESSMENT: 67 y.o. [place] woman   (1)  status post left mastectomy and axillary lymph node dissection March 2014 for a pT3 pN2, stage IIIA invasive lobular carcinoma, grade 1, estrogen receptor 100% positive, progesterone receptor and HER-2 negative, with an MIB-1 of 8%.  (2) R pneumothorax with port placement March 2014- resolved  (3)  treated in the adjuvant setting with docetaxel/ doxorubicin/ cyclophosphamide, the original plan being to complete 6 q. three-week doses, with Neulasta given on day 2 for granulocyte support  (4) switched to carboplatin and gemcitabine given day 1 only for cycles 5 and 6 of chemotherapy because of concerns regarding developing neuropathy; completed 04/04/2013  (5)  lymphedema, left upper extremity, grade 1  (6) status post postmastectomy radiation, completed mid October  (7)  started on tamoxifen, 20 mg daily, in late October 2014  (8)   Reconstruction also to follow approximately 6 months after radiation has been completed.  (9)  increasing pain in the right shoulder causing limited range of motion, scheduled for surgery under the care  of Dr. supple on 09/15/2013.   PLAN:  With regards to her tamoxifen, Saylah is tolerating the drug well. At this time, she does not feel like she needs anything for the hot flashes, but will let us know if they become more problematic. As noted above, she is scheduled for shoulder surgery next week, and I have recommended that she hold the tamoxifen for a couple of weeks due to the increased risk of blood clots.   Otherwise, she is scheduled for her right screening mammogram and a baseline bone density in February at [facility]. She will return to see [place] for routine followup in March. If she is still doing well at that point,  we will initiate q. 6 month visits, alternating appointments with Dr. Rowan Blase.   Kenadi voices understanding and agreement with the above plan. She knows to call with any changes or problems prior to her next scheduled appointment.   Cerina Leary PA-C    09/04/2013

## 2013-09-07 HISTORY — PX: SHOULDER ARTHROSCOPY: SHX128

## 2013-09-15 ENCOUNTER — Emergency Department (HOSPITAL_COMMUNITY)
Admission: EM | Admit: 2013-09-15 | Discharge: 2013-09-16 | Disposition: A | Discharge status: Other Acute Inpatient Hospital | Payer: Medicare Other | Attending: Emergency Medicine | Admitting: Emergency Medicine

## 2013-09-15 ENCOUNTER — Encounter (HOSPITAL_COMMUNITY): Payer: Self-pay | Admitting: Emergency Medicine

## 2013-09-15 DIAGNOSIS — Z7982 Long term (current) use of aspirin: Secondary | ICD-10-CM | POA: Insufficient documentation

## 2013-09-15 DIAGNOSIS — Z87891 Personal history of nicotine dependence: Secondary | ICD-10-CM | POA: Insufficient documentation

## 2013-09-15 DIAGNOSIS — I10 Essential (primary) hypertension: Secondary | ICD-10-CM | POA: Insufficient documentation

## 2013-09-15 DIAGNOSIS — Z880 Allergy status to penicillin: Secondary | ICD-10-CM | POA: Insufficient documentation

## 2013-09-15 DIAGNOSIS — Z872 Personal history of diseases of the skin and subcutaneous tissue: Secondary | ICD-10-CM | POA: Insufficient documentation

## 2013-09-15 DIAGNOSIS — Z853 Personal history of malignant neoplasm of breast: Secondary | ICD-10-CM | POA: Insufficient documentation

## 2013-09-15 DIAGNOSIS — Z8742 Personal history of other diseases of the female genital tract: Secondary | ICD-10-CM | POA: Insufficient documentation

## 2013-09-15 DIAGNOSIS — F200 Paranoid schizophrenia: Secondary | ICD-10-CM | POA: Insufficient documentation

## 2013-09-15 DIAGNOSIS — E119 Type 2 diabetes mellitus without complications: Secondary | ICD-10-CM | POA: Insufficient documentation

## 2013-09-15 DIAGNOSIS — E785 Hyperlipidemia, unspecified: Secondary | ICD-10-CM | POA: Insufficient documentation

## 2013-09-15 DIAGNOSIS — Z79899 Other long term (current) drug therapy: Secondary | ICD-10-CM | POA: Insufficient documentation

## 2013-09-15 DIAGNOSIS — Z86718 Personal history of other venous thrombosis and embolism: Secondary | ICD-10-CM | POA: Insufficient documentation

## 2013-09-15 DIAGNOSIS — Z794 Long term (current) use of insulin: Secondary | ICD-10-CM | POA: Insufficient documentation

## 2013-09-15 DIAGNOSIS — K219 Gastro-esophageal reflux disease without esophagitis: Secondary | ICD-10-CM | POA: Insufficient documentation

## 2013-09-15 DIAGNOSIS — M19019 Primary osteoarthritis, unspecified shoulder: Secondary | ICD-10-CM | POA: Diagnosis not present

## 2013-09-15 DIAGNOSIS — M24119 Other articular cartilage disorders, unspecified shoulder: Secondary | ICD-10-CM | POA: Diagnosis not present

## 2013-09-15 DIAGNOSIS — M25819 Other specified joint disorders, unspecified shoulder: Secondary | ICD-10-CM | POA: Diagnosis not present

## 2013-09-15 DIAGNOSIS — M7520 Bicipital tendinitis, unspecified shoulder: Secondary | ICD-10-CM | POA: Diagnosis not present

## 2013-09-15 DIAGNOSIS — M67919 Unspecified disorder of synovium and tendon, unspecified shoulder: Secondary | ICD-10-CM | POA: Diagnosis not present

## 2013-09-15 DIAGNOSIS — S43499A Other sprain of unspecified shoulder joint, initial encounter: Secondary | ICD-10-CM | POA: Diagnosis not present

## 2013-09-15 DIAGNOSIS — G8918 Other acute postprocedural pain: Secondary | ICD-10-CM | POA: Diagnosis not present

## 2013-09-15 LAB — COMPREHENSIVE METABOLIC PANEL
ALT: 23 U/L (ref 0–35)
AST: 30 U/L (ref 0–37)
Albumin: 4.1 g/dL (ref 3.5–5.2)
Alkaline Phosphatase: 93 U/L (ref 39–117)
BUN: 7 mg/dL (ref 6–23)
CALCIUM: 9.6 mg/dL (ref 8.4–10.5)
CHLORIDE: 99 meq/L (ref 96–112)
CO2: 28 meq/L (ref 19–32)
Creatinine, Ser: 0.6 mg/dL (ref 0.50–1.10)
GFR calc Af Amer: >90 mL/min (ref >90)
Glucose, Bld: 135 mg/dL — ABNORMAL HIGH (ref 70–99)
POTASSIUM: 3.3 meq/L — AB (ref 3.7–5.3)
Sodium: 138 mEq/L (ref 137–147)
Total Bilirubin: 0.8 mg/dL (ref 0.3–1.2)
Total Protein: 7.8 g/dL (ref 6.0–8.3)

## 2013-09-15 LAB — SALICYLATE LEVEL: Salicylate Lvl: <2 mg/dL — ABNORMAL LOW (ref 2.8–20.0)

## 2013-09-15 LAB — RAPID URINE DRUG SCREEN, HOSP PERFORMED
Amphetamines: NOT DETECTED
Barbiturates: NOT DETECTED
Benzodiazepines: NOT DETECTED
Cocaine: NOT DETECTED
Opiates: NOT DETECTED
Tetrahydrocannabinol: NOT DETECTED

## 2013-09-15 LAB — CBC
HCT: 41.5 % (ref 36.0–46.0)
Hemoglobin: 13.8 g/dL (ref 12.0–15.0)
MCH: 28.3 pg (ref 26.0–34.0)
MCHC: 33.3 g/dL (ref 30.0–36.0)
MCV: 85.2 fL (ref 78.0–100.0)
PLATELETS: 358 10*3/uL (ref 150–400)
RBC: 4.87 MIL/uL (ref 3.87–5.11)
RDW: 13.2 % (ref 11.5–15.5)
WBC: 6.8 10*3/uL (ref 4.0–10.5)

## 2013-09-15 LAB — ETHANOL: Alcohol, Ethyl (B): <11 mg/dL (ref 0–11)

## 2013-09-15 LAB — GLUCOSE, CAPILLARY: Glucose-Capillary: 156 mg/dL — ABNORMAL HIGH (ref 70–99)

## 2013-09-15 LAB — ACETAMINOPHEN LEVEL: Acetaminophen (Tylenol), Serum: <15 ug/mL (ref 10–30)

## 2013-09-15 MED ORDER — METOPROLOL TARTRATE 25 MG PO TABS
100.0000 mg | ORAL_TABLET | Freq: Two times a day (BID) | ORAL | Status: DC
  Administered 2013-09-15 – 2013-09-16 (×3): 100 mg via ORAL
  Filled 2013-09-15 (×3): qty 4

## 2013-09-15 MED ORDER — METFORMIN HCL 500 MG PO TABS
1000.0000 mg | ORAL_TABLET | Freq: Two times a day (BID) | ORAL | Status: DC
  Administered 2013-09-15 – 2013-09-16 (×3): 1000 mg via ORAL
  Filled 2013-09-15 (×6): qty 2

## 2013-09-15 MED ORDER — OLANZAPINE 5 MG PO TABS: 5.0000 mg | ORAL_TABLET | Freq: Every day | ORAL | Status: DC

## 2013-09-15 MED ORDER — SIMVASTATIN 5 MG PO TABS
5.0000 mg | ORAL_TABLET | Freq: Every day | ORAL | Status: DC
  Administered 2013-09-15: 5 mg via ORAL
  Filled 2013-09-15 (×3): qty 1

## 2013-09-15 MED ORDER — LISINOPRIL 5 MG PO TABS
5.0000 mg | ORAL_TABLET | Freq: Every day | ORAL | Status: DC
  Administered 2013-09-15 – 2013-09-16 (×2): 5 mg via ORAL
  Filled 2013-09-15 (×3): qty 1

## 2013-09-15 MED ORDER — ONDANSETRON HCL 4 MG PO TABS: 4.0000 mg | ORAL_TABLET | Freq: Three times a day (TID) | ORAL | Status: DC | PRN

## 2013-09-15 MED ORDER — POTASSIUM CHLORIDE CRYS ER 20 MEQ PO TBCR
40.0000 meq | EXTENDED_RELEASE_TABLET | Freq: Once | ORAL | Status: AC
  Administered 2013-09-15: 40 meq via ORAL
  Filled 2013-09-15: qty 2

## 2013-09-15 MED ORDER — INSULIN GLARGINE 100 UNIT/ML ~~LOC~~ SOLN
33.0000 [IU] | Freq: Every day | SUBCUTANEOUS | Status: DC
  Administered 2013-09-15 – 2013-09-16 (×2): 33 [IU] via SUBCUTANEOUS
  Filled 2013-09-15 (×3): qty 0.33

## 2013-09-15 MED ORDER — ASPIRIN 81 MG PO CHEW
81.0000 mg | CHEWABLE_TABLET | Freq: Every day | ORAL | Status: DC
  Administered 2013-09-15 – 2013-09-16 (×2): 81 mg via ORAL
  Filled 2013-09-15 (×2): qty 1

## 2013-09-15 MED ORDER — ACETAMINOPHEN 325 MG PO TABS
650.0000 mg | ORAL_TABLET | ORAL | Status: DC | PRN
  Filled 2013-09-15: qty 2

## 2013-09-15 MED ORDER — OLANZAPINE 10 MG PO TABS
10.0000 mg | ORAL_TABLET | Freq: Every day | ORAL | Status: DC
  Administered 2013-09-15: 10 mg via ORAL
  Filled 2013-09-15: qty 1

## 2013-09-15 MED ORDER — ALUM & MAG HYDROXIDE-SIMETH 200-200-20 MG/5ML PO SUSP
30.0000 mL | ORAL | Status: DC | PRN
  Filled 2013-09-15: qty 30

## 2013-09-15 MED ORDER — TRAMADOL HCL 50 MG PO TABS
50.0000 mg | ORAL_TABLET | Freq: Once | ORAL | Status: AC
  Administered 2013-09-15: 50 mg via ORAL
  Filled 2013-09-15: qty 1

## 2013-09-15 MED ORDER — LORAZEPAM 1 MG PO TABS: 1.0000 mg | ORAL_TABLET | Freq: Three times a day (TID) | ORAL | Status: DC | PRN

## 2013-09-15 MED ORDER — NICOTINE 21 MG/24HR TD PT24
21.0000 mg | MEDICATED_PATCH | Freq: Every day | TRANSDERMAL | Status: DC
  Filled 2013-09-15: qty 1

## 2013-09-15 NOTE — ED Notes (Signed)
Pt remains sitting in chair in no acute distress, continues to await room assignment
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
## 2013-09-15 NOTE — BH Assessment (Signed)
Tele Assessment Note   [name] is an 68 y.o. female, African-American who was brought to [facility] accompanied by Event organiser after being petitioned for IVC by her daughter, [name] [phone]. Pt states she was petitioned for IVC by her daughter because they were arguing about Pt getting more money from her social security check. Pt reports that she has been living with her daughter for 11 years and daughter has power of attorney. Pt states daughter is supposed to pay the bills and then give her money each month but that daughter is taking all the money. Pt states she wants money so she can have her teeth fixed. Pt states since daughter is refusing to give Pt money that she wants to move out and get her own place. Pt denies any suicidal ideation or history of suicide attempts. Pt denies any intentional self-harm behaviors. Pt denies any homicidal ideation or history of violence or aggression. Pt denies auditory or visual hallucinations. Pt denies feeling paranoid or that people are against her, except for her daughter. Pt denies alcohol or drug use.  IVC paperwork states Pt had a knife under her pillow and that Pt had cut holes in the bed sheets. Pt states she did have a steak knife in her room but it was not under her pillow. Pt states she had a frozen banana cream pie in her room to thaw because the other people in the house would eat it all if she left it in the kitchen and the knife was to cut the pie. She states one of her grandsons came into her room for a piece of pie and knew she had the knife. She said her daughter accused her of poking holes in the sheets but Pt doesn't know what she was talking about.   Pt states she is seen outpatient at [facility] due to her mental health problems and she is compliant with medications. She states she was last seen by a psychiatrist in August 2014. She reports she was hospitalized 11 years ago because "I was homeless and had a nervous  breakdown and really did need to be in a hospital." Pt states when she was discharged she began living with her daughter. Pt states she lived with her daughter, daughter's boyfriend and three grandsons.   Pt is somewhat disheveled, alert, oriented x4 with normal speech and normal motor behavior. Her eye contact is good. Pt's mood is angry due to feeling that daughter is trying to have her hospitalized without cause and that daughter is refusing to give her money. Pt's affect is congruent with mood. Pt's thought process is coherent, goal directed and she does not appear to be responding to internal stimuli. Whether Pt's accusations about daughter taking her money is delusional is difficult to determine based solely on Pt's report. Pt was pleasant and cooperative throughout assessment.  Attempted to contact petitioner, [name] at [phone] to get additional information to support IVC or justify inpatient psychiatric hospitalization. Telephone number connected to an unidentified voicemail. No message was left due to voicemail being unidentified.    Axis I: 295.90 Schizophrenia Axis II: Deferred Axis III:  Past Medical History  Diagnosis Date  . Diabetes mellitus   . GERD (gastroesophageal reflux disease)   . Hyperlipidemia   . Hypertension   . Schizophrenia   . Atypical nevus   . History of mitral valve prolapse   . Breast cancer 07/14/06    Left breast. Ductal carcinoma, grade  3. Followed by Dr. Donnie Coffin. s/p lumpectomy with radiation  . Goiter     U/S 08/2009-persistent multiple small nodules, one in the right lower pole slightly enlarged, recommend continued followup.  . Ovarian cyst   . Fibroids   . DVT (deep vein thrombosis) in pregnancy 1982    Affected the RLE. Required coumadin.  . Dental caries    Axis IV: problems with primary support group Axis V: GAF=45  Past Medical History:  Past Medical History  Diagnosis Date  . Diabetes mellitus   . GERD (gastroesophageal  reflux disease)   . Hyperlipidemia   . Hypertension   . Schizophrenia   . Atypical nevus   . History of mitral valve prolapse   . Breast cancer 07/14/06    Left breast. Ductal carcinoma, grade 3. Followed by Dr. Donnie Coffin. s/p lumpectomy with radiation  . Goiter     U/S 08/2009-persistent multiple small nodules, one in the right lower pole slightly enlarged, recommend continued followup.  . Ovarian cyst   . Fibroids   . DVT (deep vein thrombosis) in pregnancy 1982    Affected the RLE. Required coumadin.  . Dental caries     Past Surgical History  Procedure Laterality Date  . Lumpectory Left   . Tonsillectomy    . Tubal ligation    . Tubal ligation reversal    . Vagina surgery      Family History:  Family History  Problem Relation Age of Onset  . Diabetes Mother   . Diabetes Father     Social History:  reports that she has quit smoking. She has never used smokeless tobacco. She reports that she does not drink alcohol or use illicit drugs.  Additional Social History:  Alcohol / Drug Use Pain Medications: Denies abuse Prescriptions: Denies abuse Over the Counter: Denies abuse History of alcohol / drug use?: No history of alcohol / drug abuse Longest period of sobriety (when/how long): NA  CIWA: CIWA-Ar BP: 190/89 mmHg Pulse Rate: 89 COWS:    Allergies:  Allergies  Allergen Reactions  . Penicillins     Home Medications:  (Not in a hospital admission)  OB/GYN Status:  No LMP recorded. Patient is postmenopausal.  General Assessment Data Location of Assessment: [facility] Is this a Tele or Face-to-Face Assessment?: Face-to-Face Is this an Initial Assessment or a Re-assessment for this encounter?: Initial Assessment Living Arrangements: Other relatives (Daughter, daughter's boyfriend, 3 grandchildren) Can pt return to current living arrangement?: Yes Admission Status: Involuntary Is patient capable of signing voluntary admission?: Yes Transfer from: Home Referral  Source: Other Mudlogger)     [facility] Crisis Care Plan Living Arrangements: Other relatives (Daughter, daughter's boyfriend, 3 grandchildren) Name of Psychiatrist: Transport planner Name of Therapist: None  Education Status Is patient currently in school?: No Current Grade: NA Highest grade of school patient has completed: NA Name of school: NA Contact person: NA  Risk to self Suicidal Ideation: No Suicidal Intent: No Is patient at risk for suicide?: No Suicidal Plan?: No Access to Means: No What has been your use of drugs/alcohol within the last 12 months?: Pt denies Previous Attempts/Gestures: No How many times?: 0 Other Self Harm Risks: None identified Triggers for Past Attempts: None known Intentional Self Injurious Behavior: None Family Suicide History: No Recent stressful life event(s): Conflict (Comment) (Conflict with daughter over money) Persecutory voices/beliefs?: No Depression: No Depression Symptoms: Feeling angry/irritable Substance abuse history and/or treatment for substance abuse?: No Suicide prevention information given to non-admitted patients: Not  applicable  Risk to Others Homicidal Ideation: No Thoughts of Harm to Others: No Current Homicidal Intent: No Current Homicidal Plan: No Access to Homicidal Means: No Identified Victim: None History of harm to others?: No Assessment of Violence: On admission Violent Behavior Description: IVC petition states Pt is aggressive with family Does patient have access to weapons?: No Criminal Charges Pending?: No Does patient have a court date: No  Psychosis Hallucinations: None noted Delusions: None noted (Pt denies)  Mental Status Report Appear/Hygiene: Other (Comment) (Somewhat disheveled) Eye Contact: Good Motor Activity: Freedom of movement Speech: Logical/coherent Level of Consciousness: Alert Mood: Angry Affect: Appropriate to circumstance Anxiety Level: None Thought Processes:  Coherent;Relevant Judgement: Unimpaired Orientation: Person;Place;Time;Situation Obsessive Compulsive Thoughts/Behaviors: None  Cognitive Functioning Concentration: Normal Memory: Recent Intact;Remote Intact IQ: Average Insight: Fair Impulse Control: Fair Appetite: Fair Weight Loss: 0 Weight Gain: 0 Sleep: No Change Total Hours of Sleep: 9 Vegetative Symptoms: None  ADLScreening [facility] Assessment Services) Patient's cognitive ability adequate to safely complete daily activities?: Yes Patient able to express need for assistance with ADLs?: Yes Independently performs ADLs?: Yes (appropriate for developmental age)  Prior Inpatient Therapy Prior Inpatient Therapy: Yes Prior Therapy Dates: 2005 Prior Therapy Facilty/Provider(s): [facility] Reason for Treatment: Schizophrenia  Prior Outpatient Therapy Prior Outpatient Therapy: Yes Prior Therapy Dates: Ongoing Prior Therapy Facilty/Provider(s): [facility] Reason for Treatment: Schizophrenia  ADL Screening (condition at time of admission) Patient's cognitive ability adequate to safely complete daily activities?: Yes Is the patient deaf or have difficulty hearing?: No Does the patient have difficulty seeing, even when wearing glasses/contacts?: No Does the patient have difficulty concentrating, remembering, or making decisions?: No Patient able to express need for assistance with ADLs?: Yes Does the patient have difficulty dressing or bathing?: No Independently performs ADLs?: Yes (appropriate for developmental age) Does the patient have difficulty walking or climbing stairs?: No       Abuse/Neglect Assessment (Assessment to be complete while patient is alone) Physical Abuse: Denies Verbal Abuse: Denies Sexual Abuse: Denies Exploitation of patient/patient's resources: Denies Self-Neglect: Denies Values / Beliefs Cultural Requests During Hospitalization: None Spiritual Requests During Hospitalization: None   Advance Directives  (For Healthcare) Advance Directive: Patient does not have advance directive;Patient would not like information (Pt reports daughter [name] has power of attorney) Pre-existing out of facility DNR order (yellow form or pink MOST form): No Nutrition Screen- [facility] Patient's home diet: Carb modified  Additional Information 1:1 In Past 12 Months?: No CIRT Risk: No Elopement Risk: No Does patient have medical clearance?: Yes     Disposition:  Disposition Initial Assessment Completed for this Encounter: Yes Disposition of Patient: Other dispositions Other disposition(s): Other (Comment) (Pt to be evaluated by psychiatrist this morning )  Consulted with [name], NP who agreed that without additional information from the petitioner, [name] [phone], Pt does not appear to meet criteria for IVC. [name] recommends Pt be seen by psychiatrist later this morning to determine if Pt meet criteria for IVC. Notified [name], PA-C who agrees with recommendation.  Evelena Peat, [facility], [facility] Triage Specialist    Anson Fret, Orpah Greek 09/15/2013 5:46 AM

## 2013-09-15 NOTE — Progress Notes (Signed)
CSW met with pt daughter, Sian Rockers who is patient guardian. Pt guardian provided guardianship papers and name change paper. Pt guardian previouslly marrried and was [employer].    Per pt guardian, patient medications was decreased from Zyprexa 10 mg to 5 mg about a year ago, and since then patient has been slowly declining. Patient guardian feels that patient may not even be taking the medication now as she is not always present when pt medication is due, due to guardian work schedule. Pt guardian states that Zyprexa 10 mg works best. Patient guardian stated that the reason why she had patient IVC'd is because patient has stabbed the mattress and pillow with a large kitchen knife, however would not explain what happened. Pt daughter also shared that patient had marks across her chest from what appeard to be superficial cuts from the knife. Pt daughter states that patient has become increasingly protective of grandchildren and lashing out at anyone who tries to intefere with the grandchildren. Pt daughter states patient has poor hygiene refuses to change clothes or take baths. CSW discussed with NP and psychaitrist, recommended inpatient geriatric treatment.   Dorathy Kinsman, LCSW [phone]  ED CSW .09/15/2013 1214pm

## 2013-09-15 NOTE — ED Notes (Signed)
[facility] team member in to see pt.

## 2013-09-15 NOTE — ED Provider Notes (Signed)
Medical screening examination/treatment/procedure(s) were performed by non-physician practitioner and as supervising physician I was immediately available for consultation/collaboration.  EKG Interpretation   None         Julianne Rice, MD 09/15/13 [phone]

## 2013-09-15 NOTE — ED Notes (Signed)
Pt has Guardianship paperwork in Chart

## 2013-09-15 NOTE — Progress Notes (Signed)
The following facilities were contacted regarding inptx:  [facility]- per Margaretha Sheffield can fax, referral has been faxed [facility]- per Anderson Malta can fax, referral faxed [facility]- per Roderic Palau can fax, referral faxed [facility]- per Arville Go can fax, referral faxed [facility]- per [employer] can fax, referral faxed [facility] per Abigail Butts, not taking pt's today [facility]- per Anderson Malta can fax, referral faxed [facility]- per Lahoma Rocker can fax, referral faxed [facility]- per Eustaquio Maize can fax, referral faxed [facility]- per Beverlee Nims can fax, referral faxed [facility]- per Gerald Stabs can fax, referral faxed [facility]- have beds available but do not take pt's that are [place], [facility]

## 2013-09-15 NOTE — ED Notes (Signed)
Patient denies SI, HI, AVH. Denies feelings of anxiety and depression. Patient appears anxious.  Encouragement offered. Tramdol given for continued chest pain. Patient reports improvement.  Patient safety maintained, Q 15 checks continue.

## 2013-09-15 NOTE — BH Assessment (Signed)
Assessment complete. Consulted with [name], NP who agreed that without additional information from the petitioner, [name] [phone], Pt does not appear to meet criteria for IVC. [name] recommends Pt be seen by psychiatrist later this morning to determine if Pt meet criteria for IVC. Notified [name], PA-C who agrees with recommendation.  [name], [facility] Triage Specialist

## 2013-09-15 NOTE — ED Notes (Signed)
Pt sitting in chair in room, pt calm and cooperative; awaiting room assignment

## 2013-09-15 NOTE — ED Notes (Signed)
Pt states she has chronic chest pain that is sharp in nature and she takes tramadol at home for this pain. She states she ran out of pain medication last night and the pain is an 8/10 at this time. Tylenol given for pain now and will notify MD of tramadol as Home medication.

## 2013-09-15 NOTE — BH Assessment (Signed)
Received call for assessment. Spoke with [name], PA-C who said Pt has history of schizophrenia and states her daughter is trying to get her out of the house so she can ger her social security money. Daughter stated that Pt had knife under her pillow and put holes in bed sheets. Face-to-face assessment will be initiated.  [name], [facility] Triage Specialist

## 2013-09-15 NOTE — Consult Note (Signed)
[facility] Face-to-Face Psychiatry Consult   Reason for Consult:  Paranoia Referring Physician: EDP [name] is an 67 y.o. female.  Assessment: AXIS I:  Paranoid Schizophrenia AXIS II:  Deferred AXIS III:   Past Medical History  Diagnosis Date  . Diabetes mellitus   . GERD (gastroesophageal reflux disease)   . Hyperlipidemia   . Hypertension   . Schizophrenia   . Atypical nevus   . History of mitral valve prolapse   . Breast cancer 07/14/06    Left breast. Ductal carcinoma, grade 3. Followed by Dr. Truddie Coco. s/p lumpectomy with radiation  . Goiter     U/S 08/2009-persistent multiple small nodules, one in the right lower pole slightly enlarged, recommend continued followup.  . Ovarian cyst   . Fibroids   . DVT (deep vein thrombosis) in pregnancy 1982    Affected the RLE. Required coumadin.  . Dental caries    AXIS IV:  other psychosocial or environmental problems and problems related to social environment AXIS V:  41-50 serious symptoms  Plan:  Recommend psychiatric Inpatient admission when medically cleared.  Subjective:   [name] is a 68 y.o. female patient admitted with Schizophrenia, Paranoid type.  HPI:  Evaluated female AA this am with Dr Dwyane Dee.  Patient was pleasant and cooperative.  She answered our questions promptly and made good eye contact through out the interview.  Patient states her daughter, whom she live with brought her to the hospital for no reasons.  Patient states she stopped taking her medications because she got tired of taking them.  Then patient changed her answer and said she has been taking her medications.  Patient became angry when she started talking about her daughter.  Patient states she will kill her daughter if she sees her or goes back home.  Patient states she loves her grand children but does not care about her daughter.  She claims her daughter is using her money without giving her any to even buy stamps and papers.  Patient , when asked about a  knife found in her bedroom under a pillow, became more angry.  Patient accuses her daughter of lying against her.  Patient agrees she had a knife in her bedroom and the knife was to cut pie in her bedroom. Her daughter reports finding knife holes on patient matrass and pillow.  Patient states she is not worried about anybody hurting her from outside but she is worried about her daughter.  Patient states her daughter is a threat to her and she will hurt her before her daughter will do so.  She reports she does not feel safe at home.   She denied SI/AVH.  We will seek admission at any facility for Geropsychiatry patient.  We will restart her Zyprexa while she is here with [place].  HPI Elements:   Location:  [facility]. Quality:  MODERATE.  Past Psychiatric History: Past Medical History  Diagnosis Date  . Diabetes mellitus   . GERD (gastroesophageal reflux disease)   . Hyperlipidemia   . Hypertension   . Schizophrenia   . Atypical nevus   . History of mitral valve prolapse   . Breast cancer 07/14/06    Left breast. Ductal carcinoma, grade 3. Followed by Dr. Truddie Coco. s/p lumpectomy with radiation  . Goiter     U/S 08/2009-persistent multiple small nodules, one in the right lower pole slightly enlarged, recommend continued followup.  . Ovarian cyst   . Fibroids   . DVT (deep vein  thrombosis) in pregnancy 1982    Affected the RLE. Required coumadin.  . Dental caries     reports that she has quit smoking. She has never used smokeless tobacco. She reports that she does not drink alcohol or use illicit drugs. Family History  Problem Relation Age of Onset  . Diabetes Mother   . Diabetes Father    Family History Substance Abuse: No Family Supports: Yes, List: (Daughter) Living Arrangements: Other relatives (Daughter, daughter's boyfriend, 3 grandchildren) Can pt return to current living arrangement?: Yes Abuse/Neglect [facility]) Physical Abuse: Denies Verbal Abuse: Denies Sexual Abuse: Denies Allergies:    Allergies  Allergen Reactions  . Penicillins     ACT Assessment Complete:  Yes:    Educational Status    Risk to Self: Risk to self Suicidal Ideation: No Suicidal Intent: No Is patient at risk for suicide?: No Suicidal Plan?: No Access to Means: No What has been your use of drugs/alcohol within the last 12 months?: Pt denies Previous Attempts/Gestures: No How many times?: 0 Other Self Harm Risks: None identified Triggers for Past Attempts: None known Intentional Self Injurious Behavior: None Family Suicide History: No Recent stressful life event(s): Conflict (Comment) (Conflict with daughter over money) Persecutory voices/beliefs?: No Depression: No Depression Symptoms: Feeling angry/irritable Substance abuse history and/or treatment for substance abuse?: No Suicide prevention information given to non-admitted patients: Not applicable  Risk to Others: Risk to Others Homicidal Ideation: No Thoughts of Harm to Others: No Current Homicidal Intent: No Current Homicidal Plan: No Access to Homicidal Means: No Identified Victim: None History of harm to others?: No Assessment of Violence: On admission Violent Behavior Description: IVC petition states Pt is aggressive with family Does patient have access to weapons?: No Criminal Charges Pending?: No Does patient have a court date: No  Abuse: Abuse/Neglect Assessment (Assessment to be complete while patient is alone) Physical Abuse: Denies Verbal Abuse: Denies Sexual Abuse: Denies Exploitation of patient/patient's resources: Denies Self-Neglect: Denies  Prior Inpatient Therapy: Prior Inpatient Therapy Prior Inpatient Therapy: Yes Prior Therapy Dates: 2005 Prior Therapy Facilty/Provider(s): [facility] Reason for Treatment: Schizophrenia  Prior Outpatient Therapy: Prior Outpatient Therapy Prior Outpatient Therapy: Yes Prior Therapy Dates: Ongoing Prior Therapy Facilty/Provider(s): [facility] Reason for Treatment: Schizophrenia   Additional Information: Additional Information 1:1 In Past 12 Months?: No CIRT Risk: No Elopement Risk: No Does patient have medical clearance?: Yes                  Objective: Blood pressure 163/91, pulse 72, temperature 98.2 F (36.8 C), temperature source Oral, resp. rate 16, SpO2 100.00%.There is no weight on file to calculate BMI. Results for orders placed during the hospital encounter of 09/15/13 (from the past 72 hour(s))  ACETAMINOPHEN LEVEL     Status: None   Collection Time    09/15/13 12:51 AM      Result Value Range   Acetaminophen (Tylenol), Serum <15.0  10 - 30 ug/mL   Comment:            THERAPEUTIC CONCENTRATIONS VARY     SIGNIFICANTLY. A RANGE OF 10-30     ug/mL MAY BE AN EFFECTIVE     CONCENTRATION FOR MANY PATIENTS.     HOWEVER, SOME ARE BEST TREATED     AT CONCENTRATIONS OUTSIDE THIS     RANGE.     ACETAMINOPHEN CONCENTRATIONS     >150 ug/mL AT 4 HOURS AFTER     INGESTION AND >50 ug/mL AT 12     HOURS AFTER  INGESTION ARE     OFTEN ASSOCIATED WITH TOXIC     REACTIONS.  CBC     Status: None   Collection Time    09/15/13 12:51 AM      Result Value Range   WBC 6.8  4.0 - 10.5 K/uL   RBC 4.87  3.87 - 5.11 MIL/uL   Hemoglobin 13.8  12.0 - 15.0 g/dL   HCT 41.5  36.0 - 46.0 %   MCV 85.2  78.0 - 100.0 fL   MCH 28.3  26.0 - 34.0 pg   MCHC 33.3  30.0 - 36.0 g/dL   RDW 13.2  11.5 - 15.5 %   Platelets 358  150 - 400 K/uL  COMPREHENSIVE METABOLIC PANEL     Status: Abnormal   Collection Time    09/15/13 12:51 AM      Result Value Range   Sodium 138  137 - 147 mEq/L   Potassium 3.3 (*) 3.7 - 5.3 mEq/L   Chloride 99  96 - 112 mEq/L   CO2 28  19 - 32 mEq/L   Glucose, Bld 135 (*) 70 - 99 mg/dL   BUN 7  6 - 23 mg/dL   Creatinine, Ser 0.60  0.50 - 1.10 mg/dL   Calcium 9.6  8.4 - 10.5 mg/dL   Total Protein 7.8  6.0 - 8.3 g/dL   Albumin 4.1  3.5 - 5.2 g/dL   AST 30  0 - 37 U/L   ALT 23  0 - 35 U/L   Alkaline Phosphatase 93  39 - 117 U/L   Total  Bilirubin 0.8  0.3 - 1.2 mg/dL   GFR calc non Af Amer >90  >90 mL/min   GFR calc Af Amer >90  >90 mL/min   Comment: (NOTE)     The eGFR has been calculated using the CKD EPI equation.     This calculation has not been validated in all clinical situations.     eGFR's persistently <90 mL/min signify possible Chronic Kidney     Disease.  ETHANOL     Status: None   Collection Time    09/15/13 12:51 AM      Result Value Range   Alcohol, Ethyl (B) <11  0 - 11 mg/dL   Comment:            LOWEST DETECTABLE LIMIT FOR     SERUM ALCOHOL IS 11 mg/dL     FOR MEDICAL PURPOSES ONLY  SALICYLATE LEVEL     Status: Abnormal   Collection Time    09/15/13 12:51 AM      Result Value Range   Salicylate Lvl <0.7 (*) 2.8 - 20.0 mg/dL  URINE RAPID DRUG SCREEN (HOSP PERFORMED)     Status: None   Collection Time    09/15/13  2:15 AM      Result Value Range   Opiates NONE DETECTED  NONE DETECTED   Cocaine NONE DETECTED  NONE DETECTED   Benzodiazepines NONE DETECTED  NONE DETECTED   Amphetamines NONE DETECTED  NONE DETECTED   Tetrahydrocannabinol NONE DETECTED  NONE DETECTED   Barbiturates NONE DETECTED  NONE DETECTED   Comment:            DRUG SCREEN FOR MEDICAL PURPOSES     ONLY.  IF CONFIRMATION IS NEEDED     FOR ANY PURPOSE, NOTIFY LAB     WITHIN 5 DAYS.  LOWEST DETECTABLE LIMITS     FOR URINE DRUG SCREEN     Drug Class       Cutoff (ng/mL)     Amphetamine      1000     Barbiturate      200     Benzodiazepine   921     Tricyclics       194     Opiates          300     Cocaine          300     THC              50  GLUCOSE, CAPILLARY     Status: Abnormal   Collection Time    09/15/13 10:31 AM      Result Value Range   Glucose-Capillary 156 (*) 70 - 99 mg/dL   Labs are reviewed and are pertinent for UNREMARKABLE, LOW POTASSIUM,..  Current Facility-Administered Medications  Medication Dose Route Frequency Provider Last Rate Last Dose  . acetaminophen (TYLENOL) tablet 650 mg   650 mg Oral Q4H PRN Remer Macho, PA-C      . alum & mag hydroxide-simeth (MAALOX/MYLANTA) 200-200-20 MG/5ML suspension 30 mL  30 mL Oral PRN Arville Lime Schinlever, PA-C      . aspirin chewable tablet 81 mg  81 mg Oral Daily Arville Lime Schinlever, PA-C   81 mg at 09/15/13 0939  . insulin glargine (LANTUS) injection 33 Units  33 Units Subcutaneous Daily Catherine E Schinlever, PA-C      . lisinopril (PRINIVIL,ZESTRIL) tablet 5 mg  5 mg Oral Daily Arville Lime Schinlever, PA-C      . LORazepam (ATIVAN) tablet 1 mg  1 mg Oral Q8H PRN Arville Lime Schinlever, PA-C      . metFORMIN (GLUCOPHAGE) tablet 1,000 mg  1,000 mg Oral BID WC Arville Lime Schinlever, PA-C   1,000 mg at 09/15/13 0939  . metoprolol tartrate (LOPRESSOR) tablet 100 mg  100 mg Oral BID Arville Lime Schinlever, PA-C   100 mg at 09/15/13 1740  . nicotine (NICODERM CQ - dosed in mg/24 hours) patch 21 mg  21 mg Transdermal Daily Catherine E Schinlever, PA-C      . OLANZapine (ZYPREXA) tablet 10 mg  10 mg Oral QHS Catherine E Schinlever, PA-C      . ondansetron [facility]) tablet 4 mg  4 mg Oral Q8H PRN Arville Lime Schinlever, PA-C      . simvastatin (ZOCOR) tablet 5 mg  5 mg Oral q1800 Remer Macho, PA-C       Current Outpatient Prescriptions  Medication Sig Dispense Refill  . aspirin 81 MG tablet Take 1 tablet (81 mg total) by mouth daily.  30 tablet  11  . calcium-vitamin D (OSCAL 500/200 D-3) 500-200 MG-UNIT per tablet Take 1 tablet by mouth 3 (three) times daily.  90 tablet  11  . LANTUS SOLOSTAR 100 UNIT/ML SOPN INJECT 33 UNITS SUBCUTANEOUSLY EVERY DAY  3 pen  3  . lisinopril (PRINIVIL,ZESTRIL) 5 MG tablet Take 1 tablet (5 mg total) by mouth daily.  90 tablet  3  . metFORMIN (GLUCOPHAGE) 1000 MG tablet Take 1 tablet (1,000 mg total) by mouth 2 (two) times daily with a meal.  60 tablet  11  . metoprolol (LOPRESSOR) 50 MG tablet Take 2 tablets (100 mg total) by mouth 2 (two) times daily.  360 tablet  1  . OLANZapine  (ZYPREXA) 10 MG tablet Take 1  tablet by mouth in the morning and 1 tablet in the evening      . pravastatin (PRAVACHOL) 40 MG tablet TAKE ONE TABLET BY MOUTH EVERY DAY  30 tablet  6  . ranitidine (ZANTAC) 150 MG capsule Take 1 capsule (150 mg total) by mouth 2 (two) times daily.  180 capsule  3  . traMADol (ULTRAM) 50 MG tablet TAKE TWO TABLETS BY MOUTH EVERY 8 HOURS AS NEEDED FOR PAIN.  120 tablet  0    Psychiatric Specialty Exam:     Blood pressure 163/91, pulse 72, temperature 98.2 F (36.8 C), temperature source Oral, resp. rate 16, SpO2 100.00%.There is no weight on file to calculate BMI.  General Appearance: Casual  Eye Contact::  Good  Speech:  Clear and Coherent and Normal Rate  Volume:  Normal  Mood:  Angry  Affect:  Congruent, Depressed and Flat  Thought Process:  Coherent  Orientation:  Full (Time, Place, and Person)  Thought Content:  Paranoid Ideation and HOMICIDAL  Suicidal Thoughts:  No  Homicidal Thoughts:  Yes.  without intent/plan  Memory:  Immediate;   Good Recent;   Fair Remote;   Fair  Judgement:  Poor  Insight:  Shallow  Psychomotor Activity:  Normal  Concentration:  Good  Recall:  NA  Akathisia:  NA  Handed:  Right  AIMS (if indicated):     Assets:  Desire for Improvement  Sleep:      Treatment Plan Summary:  Consult and face to face interview with Dr Dwyane Dee We will refer patient to Geropsychiatry unit for admission and monitoring We will resume her zyprexa 10 mg po qhs per home medications. Daily contact with patient to assess and evaluate symptoms and progress in treatment Medication management  Charmaine Downs, C 09/15/2013 12:15 PM

## 2013-09-15 NOTE — ED Notes (Signed)
Patient has two bags of belongings in locker 29. 

## 2013-09-15 NOTE — Progress Notes (Signed)
CSW received call from pt daughter who states she is patient legal guardian. Pt daughter bringing patient guardianship papers to the ED. Once guardianship is confirmed, pt daughter can have updates.   Dorathy Kinsman, LCSW [phone]  ED CSW .09/15/2013 920am

## 2013-09-15 NOTE — ED Notes (Signed)
Pt remains sitting in chair awake and alert, continues to await room assignment

## 2013-09-16 ENCOUNTER — Emergency Department (HOSPITAL_COMMUNITY): Payer: Medicare Other

## 2013-09-16 ENCOUNTER — Other Ambulatory Visit: Payer: Self-pay

## 2013-09-16 MED ORDER — TRAZODONE HCL 50 MG PO TABS
50.0000 mg | ORAL_TABLET | Freq: Every day | ORAL | Status: DC
  Administered 2013-09-16: 50 mg via ORAL
  Filled 2013-09-16: qty 1

## 2013-09-16 NOTE — BH Assessment (Signed)
Received call from [place] at [facility] who said Pt is accepted for transferred by Dr. Macky Lower after 0800 today. Nursing report should be called to [phone]. Contacted Clyde Canterbury, disposition [facility]HT, and asked that IVC paperwork be faxed to [facility]. Notified Dr. Sharol Given and Danelle Earthly, RN of acceptance.  [name], [facility] Triage Specialist

## 2013-09-16 NOTE — Progress Notes (Signed)
MHT faxed UDS, medication list given in ER and at home, PCP name, SSN, and reported can complete ADL independently per RN report.  MHT called Melody to let her know, will fax in EKG and chest x-ray when test results are available.  Wyvonnia Dusky, MHT/NS

## 2013-09-16 NOTE — ED Notes (Signed)
Report called to Nevin Bloodgood, Therapist, sports at [facility]. [phone].

## 2013-09-16 NOTE — Consult Note (Signed)
Patient personally examined by me, treatment plan along with disposition and medication management formulated by me

## 2013-09-16 NOTE — Progress Notes (Signed)
MHT faxed completed chest xray and EKG to [facility].  Confirmed with Melody that paperwork needed was received to present to MD in AM.  Wyvonnia Dusky, MHT/NS

## 2013-09-16 NOTE — Progress Notes (Signed)
IVC paperwork faxed to [facility]. Message left with Brandon Melnick that patient needs transport to [facility] after 0800 on 09/16/2013.

## 2013-09-16 NOTE — Progress Notes (Signed)
MHT spoke with Jinny Blossom, [place] at [facility].  Pt has been accepted at [facility] per [place], [facility] report.  Jinny Blossom, RN will faxed IVC and 1st examination to [facility] at [phone].  Wyvonnia Dusky, MHT/NS

## 2013-09-16 NOTE — ED Notes (Signed)
[facility]iff dept here to transport to [facility]. Per night nurse report was called to [place] on her shift. Belongings bag x1 and large black coat given to officer. Ambulatory without difficulty. Denies SI/HI and A/V hallucinations. No complaints voiced. Pleasant and cooperative.

## 2013-09-21 DIAGNOSIS — M25519 Pain in unspecified shoulder: Secondary | ICD-10-CM | POA: Diagnosis not present

## 2013-09-22 DIAGNOSIS — Z9889 Other specified postprocedural states: Secondary | ICD-10-CM | POA: Diagnosis not present

## 2013-09-22 DIAGNOSIS — M25819 Other specified joint disorders, unspecified shoulder: Secondary | ICD-10-CM | POA: Diagnosis not present

## 2013-09-25 DIAGNOSIS — M25519 Pain in unspecified shoulder: Secondary | ICD-10-CM | POA: Diagnosis not present

## 2013-09-28 DIAGNOSIS — M25519 Pain in unspecified shoulder: Secondary | ICD-10-CM | POA: Diagnosis not present

## 2013-10-02 ENCOUNTER — Emergency Department (HOSPITAL_COMMUNITY)
Admission: EM | Admit: 2013-10-02 | Discharge: 2013-10-02 | Disposition: A | Discharge status: Home / Self Care | Payer: Medicare Other | Attending: Emergency Medicine | Admitting: Emergency Medicine

## 2013-10-02 ENCOUNTER — Emergency Department (HOSPITAL_COMMUNITY): Payer: Medicare Other

## 2013-10-02 ENCOUNTER — Encounter (HOSPITAL_COMMUNITY): Payer: Self-pay | Admitting: Emergency Medicine

## 2013-10-02 DIAGNOSIS — Z7982 Long term (current) use of aspirin: Secondary | ICD-10-CM | POA: Insufficient documentation

## 2013-10-02 DIAGNOSIS — R0789 Other chest pain: Secondary | ICD-10-CM | POA: Insufficient documentation

## 2013-10-02 DIAGNOSIS — E785 Hyperlipidemia, unspecified: Secondary | ICD-10-CM | POA: Insufficient documentation

## 2013-10-02 DIAGNOSIS — Z87891 Personal history of nicotine dependence: Secondary | ICD-10-CM | POA: Insufficient documentation

## 2013-10-02 DIAGNOSIS — E119 Type 2 diabetes mellitus without complications: Secondary | ICD-10-CM | POA: Insufficient documentation

## 2013-10-02 DIAGNOSIS — R000 Tachycardia, unspecified: Secondary | ICD-10-CM | POA: Insufficient documentation

## 2013-10-02 DIAGNOSIS — Z008 Encounter for other general examination: Secondary | ICD-10-CM | POA: Insufficient documentation

## 2013-10-02 DIAGNOSIS — K5900 Constipation, unspecified: Secondary | ICD-10-CM | POA: Insufficient documentation

## 2013-10-02 DIAGNOSIS — Z872 Personal history of diseases of the skin and subcutaneous tissue: Secondary | ICD-10-CM | POA: Insufficient documentation

## 2013-10-02 DIAGNOSIS — F209 Schizophrenia, unspecified: Secondary | ICD-10-CM | POA: Insufficient documentation

## 2013-10-02 DIAGNOSIS — Z79899 Other long term (current) drug therapy: Secondary | ICD-10-CM | POA: Insufficient documentation

## 2013-10-02 DIAGNOSIS — Z8742 Personal history of other diseases of the female genital tract: Secondary | ICD-10-CM | POA: Insufficient documentation

## 2013-10-02 DIAGNOSIS — R079 Chest pain, unspecified: Secondary | ICD-10-CM

## 2013-10-02 DIAGNOSIS — K029 Dental caries, unspecified: Secondary | ICD-10-CM | POA: Insufficient documentation

## 2013-10-02 DIAGNOSIS — I10 Essential (primary) hypertension: Secondary | ICD-10-CM | POA: Insufficient documentation

## 2013-10-02 DIAGNOSIS — K219 Gastro-esophageal reflux disease without esophagitis: Secondary | ICD-10-CM | POA: Insufficient documentation

## 2013-10-02 DIAGNOSIS — Z880 Allergy status to penicillin: Secondary | ICD-10-CM | POA: Insufficient documentation

## 2013-10-02 DIAGNOSIS — R0602 Shortness of breath: Secondary | ICD-10-CM | POA: Insufficient documentation

## 2013-10-02 DIAGNOSIS — Z853 Personal history of malignant neoplasm of breast: Secondary | ICD-10-CM | POA: Insufficient documentation

## 2013-10-02 DIAGNOSIS — M25519 Pain in unspecified shoulder: Secondary | ICD-10-CM | POA: Diagnosis not present

## 2013-10-02 LAB — COMPREHENSIVE METABOLIC PANEL
ALBUMIN: 4 g/dL (ref 3.5–5.2)
ALT: 22 U/L (ref 0–35)
AST: 20 U/L (ref 0–37)
Alkaline Phosphatase: 94 U/L (ref 39–117)
BUN: 8 mg/dL (ref 6–23)
CALCIUM: 9.8 mg/dL (ref 8.4–10.5)
CO2: 24 mEq/L (ref 19–32)
Chloride: 99 mEq/L (ref 96–112)
Creatinine, Ser: 0.54 mg/dL (ref 0.50–1.10)
GFR calc Af Amer: >90 mL/min (ref >90)
GFR calc non Af Amer: >90 mL/min (ref >90)
Glucose, Bld: 109 mg/dL — ABNORMAL HIGH (ref 70–99)
Potassium: 3.8 mEq/L (ref 3.7–5.3)
Sodium: 138 mEq/L (ref 137–147)
TOTAL PROTEIN: 8.2 g/dL (ref 6.0–8.3)
Total Bilirubin: 0.9 mg/dL (ref 0.3–1.2)

## 2013-10-02 LAB — GLUCOSE, CAPILLARY: Glucose-Capillary: 110 mg/dL — ABNORMAL HIGH (ref 70–99)

## 2013-10-02 LAB — POCT I-STAT TROPONIN I
Troponin i, poc: 0 ng/mL (ref 0.00–0.08)
Troponin i, poc: 0 ng/mL (ref 0.00–0.08)

## 2013-10-02 LAB — CBC WITH DIFFERENTIAL/PLATELET
BASOS ABS: 0 10*3/uL (ref 0.0–0.1)
Basophils Relative: 0 % (ref 0–1)
EOS ABS: 0 10*3/uL (ref 0.0–0.7)
Eosinophils Relative: 0 % (ref 0–5)
HCT: 39.6 % (ref 36.0–46.0)
Hemoglobin: 13 g/dL (ref 12.0–15.0)
Lymphocytes Relative: 17 % (ref 12–46)
Lymphs Abs: 1.8 10*3/uL (ref 0.7–4.0)
MCH: 28.2 pg (ref 26.0–34.0)
MCHC: 32.8 g/dL (ref 30.0–36.0)
MCV: 85.9 fL (ref 78.0–100.0)
Monocytes Absolute: 1 10*3/uL (ref 0.1–1.0)
Monocytes Relative: 9 % (ref 3–12)
NEUTROS ABS: 7.8 10*3/uL — AB (ref 1.7–7.7)
Neutrophils Relative %: 73 % (ref 43–77)
PLATELETS: 352 10*3/uL (ref 150–400)
RBC: 4.61 MIL/uL (ref 3.87–5.11)
RDW: 13.5 % (ref 11.5–15.5)
WBC: 10.7 10*3/uL — ABNORMAL HIGH (ref 4.0–10.5)

## 2013-10-02 LAB — D-DIMER, QUANTITATIVE (NOT AT ARMC): D DIMER QUANT: 1.32 ug{FEU}/mL — AB (ref 0.00–0.48)

## 2013-10-02 LAB — LIPASE, BLOOD: Lipase: 19 U/L (ref 11–59)

## 2013-10-02 MED ORDER — METFORMIN HCL 500 MG PO TABS
1000.0000 mg | ORAL_TABLET | Freq: Two times a day (BID) | ORAL | Status: DC
  Administered 2013-10-02: 1000 mg via ORAL
  Filled 2013-10-02 (×2): qty 2

## 2013-10-02 MED ORDER — QUETIAPINE FUMARATE 50 MG PO TABS: 150.0000 mg | ORAL_TABLET | Freq: Every day | ORAL | Status: DC

## 2013-10-02 MED ORDER — OLANZAPINE 5 MG PO TABS: 25.0000 mg | ORAL_TABLET | Freq: Every day | ORAL | Status: DC

## 2013-10-02 MED ORDER — CALCIUM CARBONATE-VITAMIN D 500-200 MG-UNIT PO TABS
1.0000 | ORAL_TABLET | Freq: Three times a day (TID) | ORAL | Status: DC
  Filled 2013-10-02: qty 1

## 2013-10-02 MED ORDER — ACETAMINOPHEN 325 MG PO TABS: 650.0000 mg | ORAL_TABLET | ORAL | Status: DC | PRN

## 2013-10-02 MED ORDER — FAMOTIDINE 20 MG PO TABS: 20.0000 mg | ORAL_TABLET | Freq: Two times a day (BID) | ORAL | Status: DC

## 2013-10-02 MED ORDER — HYDROXYZINE HCL 50 MG PO TABS
100.0000 mg | ORAL_TABLET | Freq: Every day | ORAL | Status: DC
  Filled 2013-10-02: qty 2

## 2013-10-02 MED ORDER — NITROGLYCERIN 0.4 MG SL SUBL
0.4000 mg | SUBLINGUAL_TABLET | SUBLINGUAL | Status: DC | PRN
  Administered 2013-10-02 (×2): 0.4 mg via SUBLINGUAL
  Filled 2013-10-02: qty 25

## 2013-10-02 MED ORDER — INSULIN GLARGINE 100 UNIT/ML SOLOSTAR PEN: 33.0000 [IU] | PEN_INJECTOR | Freq: Every day | SUBCUTANEOUS | Status: DC

## 2013-10-02 MED ORDER — ASPIRIN 81 MG PO CHEW
81.0000 mg | CHEWABLE_TABLET | Freq: Every day | ORAL | Status: DC
  Administered 2013-10-02: 81 mg via ORAL
  Filled 2013-10-02: qty 1

## 2013-10-02 MED ORDER — POLYETHYLENE GLYCOL 3350 17 G PO PACK: 17.0000 g | PACK | Freq: Every day | ORAL | Status: DC

## 2013-10-02 MED ORDER — TRAMADOL HCL 50 MG PO TABS
50.0000 mg | ORAL_TABLET | Freq: Once | ORAL | Status: AC
  Administered 2013-10-02: 50 mg via ORAL
  Filled 2013-10-02: qty 1

## 2013-10-02 MED ORDER — INSULIN GLARGINE 100 UNIT/ML ~~LOC~~ SOLN
33.0000 [IU] | Freq: Every day | SUBCUTANEOUS | Status: DC
  Filled 2013-10-02: qty 0.33

## 2013-10-02 MED ORDER — LISINOPRIL 5 MG PO TABS
5.0000 mg | ORAL_TABLET | Freq: Every day | ORAL | Status: DC
  Administered 2013-10-02: 5 mg via ORAL
  Filled 2013-10-02: qty 1

## 2013-10-02 MED ORDER — ALUM & MAG HYDROXIDE-SIMETH 200-200-20 MG/5ML PO SUSP: 30.0000 mL | ORAL | Status: DC | PRN

## 2013-10-02 MED ORDER — ONDANSETRON HCL 4 MG PO TABS: 4.0000 mg | ORAL_TABLET | Freq: Three times a day (TID) | ORAL | Status: DC | PRN

## 2013-10-02 MED ORDER — SIMVASTATIN 40 MG PO TABS
40.0000 mg | ORAL_TABLET | Freq: Every day | ORAL | Status: DC
  Administered 2013-10-02: 40 mg via ORAL
  Filled 2013-10-02: qty 1

## 2013-10-02 MED ORDER — IOHEXOL 350 MG/ML SOLN
100.0000 mL | Freq: Once | INTRAVENOUS | Status: AC | PRN
  Administered 2013-10-02: 100 mL via INTRAVENOUS

## 2013-10-02 MED ORDER — METOPROLOL TARTRATE 25 MG PO TABS: 100.0000 mg | ORAL_TABLET | Freq: Two times a day (BID) | ORAL | Status: DC

## 2013-10-02 MED ORDER — ASPIRIN 81 MG PO CHEW
324.0000 mg | CHEWABLE_TABLET | Freq: Once | ORAL | Status: AC
  Administered 2013-10-02: 324 mg via ORAL
  Filled 2013-10-02: qty 4

## 2013-10-02 NOTE — BH Assessment (Signed)
Writer ready to complete this patient's TA. Requested staff to place tele assessment machine in patient's room. Writer contacted [employer] prior to seeing patient to obtain collateral information.

## 2013-10-02 NOTE — BH Assessment (Signed)
Collateral Information from Salem Mastrogiovanni (patient's daughter/care giver/guardian) [phone]:  Patient lives with her daughter, 67 yr old grandson, and 20 yr old grandson. She has lived with her daughter "almost 37 yrs". Prior to living with her daughter she was homeless in [place].   Patient's daughter sts that she is concerned for her mothers well being. She reports paranoid behaviors in additional to defiance and rebilliousness.   Daughter sts that this is her 2nd time in the past month she has brought her mother to the ER for similar behaviors. The last ED visit led to hospitalization at [facility]. Daughter is concerned that patient was discharged home to early. Patient was discharged from [facility] last Tuesday. She was to follow up with [facility] for medication management, in which they did.   Says the first day home from her hospitalization she refused medications. Additionally, she refuses to shower. She has not showered since discharge from [facility]. Patient will not eat food due to paranoia. Daughter gave her mother a Pepsi, which patient asked for. Once patient received the pepsi she wouldn't drink it stating "It doesn't look right". Daughter sts that she will often do this, ask for food and then refuse it b/c it doesn't look right. Says that patient has "become rebellious" and argumentative daily not just about food but also about finances. Daughter sts she is constantly accused of stealing her mothers check as she has legal financial POA. Daughter feels that patient doesn't trust her at all, therefore; becomes angry/irritable with others in the home. During the night she doesn't sleep well knocking on bedroom doors of individuals in the home asking "Are you ok?". When patient is asked whey she does this she becomes angry and has no explanation.   Says that patient has become constipated over the last  3 days. Daughter tried to convince patient to got to the hospital for her  constipation. Patient has declined wanting to see a doctor. Daughter sts that patient is constipated due to over taking Tramadol.   Daughter is interested in her mother going into a assisted living facility. Says that her mother has become unmanageable and uncooperative which makes it difficult to care for her.

## 2013-10-02 NOTE — ED Provider Notes (Signed)
Medications  nitroGLYCERIN (NITROSTAT) SL tablet 0.4 mg (0.4 mg Sublingual Given 10/02/13 1424)  aspirin chewable tablet 324 mg (324 mg Oral Given 10/02/13 1408)  iohexol (OMNIPAQUE) 350 MG/ML injection 100 mL (100 mLs Intravenous Contrast Given 10/02/13 1538)    Results for orders placed during the hospital encounter of 10/02/13  CBC WITH DIFFERENTIAL      Result Value Range   WBC 10.7 (*) 4.0 - 10.5 K/uL   RBC 4.61  3.87 - 5.11 MIL/uL   Hemoglobin 13.0  12.0 - 15.0 g/dL   HCT 39.6  36.0 - 46.0 %   MCV 85.9  78.0 - 100.0 fL   MCH 28.2  26.0 - 34.0 pg   MCHC 32.8  30.0 - 36.0 g/dL   RDW 13.5  11.5 - 15.5 %   Platelets 352  150 - 400 K/uL   Neutrophils Relative % 73  43 - 77 %   Neutro Abs 7.8 (*) 1.7 - 7.7 K/uL   Lymphocytes Relative 17  12 - 46 %   Lymphs Abs 1.8  0.7 - 4.0 K/uL   Monocytes Relative 9  3 - 12 %   Monocytes Absolute 1.0  0.1 - 1.0 K/uL   Eosinophils Relative 0  0 - 5 %   Eosinophils Absolute 0.0  0.0 - 0.7 K/uL   Basophils Relative 0  0 - 1 %   Basophils Absolute 0.0  0.0 - 0.1 K/uL  D-DIMER, QUANTITATIVE      Result Value Range   D-Dimer, Quant 1.32 (*) 0.00 - 0.48 ug/mL-FEU  COMPREHENSIVE METABOLIC PANEL      Result Value Range   Sodium 138  137 - 147 mEq/L   Potassium 3.8  3.7 - 5.3 mEq/L   Chloride 99  96 - 112 mEq/L   CO2 24  19 - 32 mEq/L   Glucose, Bld 109 (*) 70 - 99 mg/dL   BUN 8  6 - 23 mg/dL   Creatinine, Ser 0.54  0.50 - 1.10 mg/dL   Calcium 9.8  8.4 - 10.5 mg/dL   Total Protein 8.2  6.0 - 8.3 g/dL   Albumin 4.0  3.5 - 5.2 g/dL   AST 20  0 - 37 U/L   ALT 22  0 - 35 U/L   Alkaline Phosphatase 94  39 - 117 U/L   Total Bilirubin 0.9  0.3 - 1.2 mg/dL   GFR calc non Af Amer >90  >90 mL/min   GFR calc Af Amer >90  >90 mL/min  LIPASE, BLOOD      Result Value Range   Lipase 19  11 - 59 U/L  POCT I-STAT TROPONIN I      Result Value Range   Troponin i, poc 0.00  0.00 - 0.08 ng/mL   Comment 3            Dg Chest 2 View  09/16/2013   CLINICAL  DATA:  Chest pain.  EXAM: CHEST  2 VIEW  COMPARISON:  None.  FINDINGS: Lungs are clear. Heart size is normal. No pneumothorax or pleural fluid.  IMPRESSION: Negative chest.   Electronically Signed   By: Inge Rise M.D.   On: 09/16/2013 01:15   Ct Angio Chest W/cm &/or Wo Cm  10/02/2013   CLINICAL DATA:  Chest pain.  EXAM: CT ANGIOGRAPHY CHEST WITH CONTRAST  TECHNIQUE: Multidetector CT imaging of the chest was performed using the standard protocol during bolus administration of intravenous contrast. Multiplanar CT image reconstructions including  MIPs were obtained to evaluate the vascular anatomy.  CONTRAST:  127mL OMNIPAQUE IOHEXOL 350 MG/ML SOLN  COMPARISON:  DG ABD ACUTE W/CHEST dated 10/02/2013  FINDINGS: Thoracic aorta normal caliber. No dissection or aneurysm. Heart size normal. Coronary artery disease. Mild pericardial thickening.  The pulmonary arteries are normal.  Shotty mediastinal lymph nodes are present. No pathologic lymph nodes noted using CT size criteria. Thoracic esophagus is unremarkable. Upper abdominal viscera normal were visualized.  Large airways patent. Bullous COPD. Ill-defined density noted in the anterior aspect of the right upper lobe, image 27/series 11. This most likely represents focal area scarring. A follow-up chest CT in 4-6 weeks suggested for further evaluation to exclude developing infiltrate or mass lesion. Similar findings also noted in the posterior right upper lobe.  Thyroid unremarkable. No supraclavicular abnormality. Shotty axillary lymph nodes are present. Small asymmetric parenchymal densities noted in both breasts. Calcification in left breast. Correlation with mammography is suggested. Degenerative changes thoracic spine.  Review of the MIP images confirms the above findings.  IMPRESSION: 1. No evidence of pulmonary embolus. 2. Below COPD. 3. Ill-defined density in the anterior aspect of the right upper lobe. This most likely represents a focal area of scarring.  A follow-up chest CT in 4-6 weeks suggested for further evaluation to exclude developing infiltrate or mass lesion. Similar finding to a lesser degree also noted in the posterior right upper lobe. 4. Asymmetric densities noted in both breasts. Mammography is suggest for further evaluation.   Electronically Signed   By: Marcello Moores  Register   On: 10/02/2013 16:11   Dg Abd Acute W/chest  10/02/2013   CLINICAL DATA:  Chest pain and constipation.  EXAM: ACUTE ABDOMEN SERIES (ABDOMEN 2 VIEW & CHEST 1 VIEW)  COMPARISON:  Chest x-ray on 09/16/2013  FINDINGS: The chest shows clear lungs. The heart size is normal. No evidence of edema, infiltrate or pleural effusion.  Abdominal films show moderate fecal material throughout the colon without evidence of focal impaction. There is no evidence of small bowel obstruction. Large calcification in the left upper pelvis likely represents a degenerated fibroid of the uterus. Additional smaller adjacent calcification may also represent fibroid calcification. The calcifications are somewhat more to the left than expected for fibroids. The uterus may be eccentric in location or potentially displaced in the pelvis.  IMPRESSION: Moderate fecal material without evidence of bowel obstruction. Calcifications in the left pelvis likely relate to degenerated fibroids. These are fairly removed to the left and the uterus may be displaced.   Electronically Signed   By: Aletta Edouard M.D.   On: 10/02/2013 14:47   Results for orders placed during the hospital encounter of 10/02/13  CBC WITH DIFFERENTIAL      Result Value Range   WBC 10.7 (*) 4.0 - 10.5 K/uL   RBC 4.61  3.87 - 5.11 MIL/uL   Hemoglobin 13.0  12.0 - 15.0 g/dL   HCT 39.6  36.0 - 46.0 %   MCV 85.9  78.0 - 100.0 fL   MCH 28.2  26.0 - 34.0 pg   MCHC 32.8  30.0 - 36.0 g/dL   RDW 13.5  11.5 - 15.5 %   Platelets 352  150 - 400 K/uL   Neutrophils Relative % 73  43 - 77 %   Neutro Abs 7.8 (*) 1.7 - 7.7 K/uL   Lymphocytes  Relative 17  12 - 46 %   Lymphs Abs 1.8  0.7 - 4.0 K/uL   Monocytes Relative 9  3 -  12 %   Monocytes Absolute 1.0  0.1 - 1.0 K/uL   Eosinophils Relative 0  0 - 5 %   Eosinophils Absolute 0.0  0.0 - 0.7 K/uL   Basophils Relative 0  0 - 1 %   Basophils Absolute 0.0  0.0 - 0.1 K/uL  D-DIMER, QUANTITATIVE      Result Value Range   D-Dimer, Quant 1.32 (*) 0.00 - 0.48 ug/mL-FEU  COMPREHENSIVE METABOLIC PANEL      Result Value Range   Sodium 138  137 - 147 mEq/L   Potassium 3.8  3.7 - 5.3 mEq/L   Chloride 99  96 - 112 mEq/L   CO2 24  19 - 32 mEq/L   Glucose, Bld 109 (*) 70 - 99 mg/dL   BUN 8  6 - 23 mg/dL   Creatinine, Ser 0.54  0.50 - 1.10 mg/dL   Calcium 9.8  8.4 - 10.5 mg/dL   Total Protein 8.2  6.0 - 8.3 g/dL   Albumin 4.0  3.5 - 5.2 g/dL   AST 20  0 - 37 U/L   ALT 22  0 - 35 U/L   Alkaline Phosphatase 94  39 - 117 U/L   Total Bilirubin 0.9  0.3 - 1.2 mg/dL   GFR calc non Af Amer >90  >90 mL/min   GFR calc Af Amer >90  >90 mL/min  LIPASE, BLOOD      Result Value Range   Lipase 19  11 - 59 U/L  POCT I-STAT TROPONIN I      Result Value Range   Troponin i, poc 0.00  0.00 - 0.08 ng/mL   Comment 3            Dg Chest 2 View  09/16/2013   CLINICAL DATA:  Chest pain.  EXAM: CHEST  2 VIEW  COMPARISON:  None.  FINDINGS: Lungs are clear. Heart size is normal. No pneumothorax or pleural fluid.  IMPRESSION: Negative chest.   Electronically Signed   By: Inge Rise M.D.   On: 09/16/2013 01:15   Ct Angio Chest W/cm &/or Wo Cm  10/02/2013   CLINICAL DATA:  Chest pain.  EXAM: CT ANGIOGRAPHY CHEST WITH CONTRAST  TECHNIQUE: Multidetector CT imaging of the chest was performed using the standard protocol during bolus administration of intravenous contrast. Multiplanar CT image reconstructions including MIPs were obtained to evaluate the vascular anatomy.  CONTRAST:  159mL OMNIPAQUE IOHEXOL 350 MG/ML SOLN  COMPARISON:  DG ABD ACUTE W/CHEST dated 10/02/2013  FINDINGS: Thoracic aorta normal  caliber. No dissection or aneurysm. Heart size normal. Coronary artery disease. Mild pericardial thickening.  The pulmonary arteries are normal.  Shotty mediastinal lymph nodes are present. No pathologic lymph nodes noted using CT size criteria. Thoracic esophagus is unremarkable. Upper abdominal viscera normal were visualized.  Large airways patent. Bullous COPD. Ill-defined density noted in the anterior aspect of the right upper lobe, image 27/series 11. This most likely represents focal area scarring. A follow-up chest CT in 4-6 weeks suggested for further evaluation to exclude developing infiltrate or mass lesion. Similar findings also noted in the posterior right upper lobe.  Thyroid unremarkable. No supraclavicular abnormality. Shotty axillary lymph nodes are present. Small asymmetric parenchymal densities noted in both breasts. Calcification in left breast. Correlation with mammography is suggested. Degenerative changes thoracic spine.  Review of the MIP images confirms the above findings.  IMPRESSION: 1. No evidence of pulmonary embolus. 2. Below COPD. 3. Ill-defined density in the anterior aspect of the  right upper lobe. This most likely represents a focal area of scarring. A follow-up chest CT in 4-6 weeks suggested for further evaluation to exclude developing infiltrate or mass lesion. Similar finding to a lesser degree also noted in the posterior right upper lobe. 4. Asymmetric densities noted in both breasts. Mammography is suggest for further evaluation.   Electronically Signed   By: Marcello Moores  Register   On: 10/02/2013 16:11   Dg Abd Acute W/chest  10/02/2013   CLINICAL DATA:  Chest pain and constipation.  EXAM: ACUTE ABDOMEN SERIES (ABDOMEN 2 VIEW & CHEST 1 VIEW)  COMPARISON:  Chest x-ray on 09/16/2013  FINDINGS: The chest shows clear lungs. The heart size is normal. No evidence of edema, infiltrate or pleural effusion.  Abdominal films show moderate fecal material throughout the colon without  evidence of focal impaction. There is no evidence of small bowel obstruction. Large calcification in the left upper pelvis likely represents a degenerated fibroid of the uterus. Additional smaller adjacent calcification may also represent fibroid calcification. The calcifications are somewhat more to the left than expected for fibroids. The uterus may be eccentric in location or potentially displaced in the pelvis.  IMPRESSION: Moderate fecal material without evidence of bowel obstruction. Calcifications in the left pelvis likely relate to degenerated fibroids. These are fairly removed to the left and the uterus may be displaced.   Electronically Signed   By: Aletta Edouard M.D.   On: 10/02/2013 14:47    CT Angio Chest W/Cm &/Or Wo Cm (Final result)  Result time: 10/02/13 16:11:28    Final result by Rad Results In Interface (10/02/13 16:11:28)    Narrative:   CLINICAL DATA: Chest pain.  EXAM: CT ANGIOGRAPHY CHEST WITH CONTRAST  TECHNIQUE: Multidetector CT imaging of the chest was performed using the standard protocol during bolus administration of intravenous contrast. Multiplanar CT image reconstructions including MIPs were obtained to evaluate the vascular anatomy.  CONTRAST: 163mL OMNIPAQUE IOHEXOL 350 MG/ML SOLN  COMPARISON: DG ABD ACUTE W/CHEST dated 10/02/2013  FINDINGS: Thoracic aorta normal caliber. No dissection or aneurysm. Heart size normal. Coronary artery disease. Mild pericardial thickening.  The pulmonary arteries are normal.  Shotty mediastinal lymph nodes are present. No pathologic lymph nodes noted using CT size criteria. Thoracic esophagus is unremarkable. Upper abdominal viscera normal were visualized.  Large airways patent. Bullous COPD. Ill-defined density noted in the anterior aspect of the right upper lobe, image 27/series 11. This most likely represents focal area scarring. A follow-up chest CT in 4-6 weeks suggested for further evaluation to exclude  developing infiltrate or mass lesion. Similar findings also noted in the posterior right upper lobe.  Thyroid unremarkable. No supraclavicular abnormality. Shotty axillary lymph nodes are present. Small asymmetric parenchymal densities noted in both breasts. Calcification in left breast. Correlation with mammography is suggested. Degenerative changes thoracic spine.  Review of the MIP images confirms the above findings.  IMPRESSION: 1. No evidence of pulmonary embolus. 2. Below COPD. 3. Ill-defined density in the anterior aspect of the right upper lobe. This most likely represents a focal area of scarring. A follow-up chest CT in 4-6 weeks suggested for further evaluation to exclude developing infiltrate or mass lesion. Similar finding to a lesser degree also noted in the posterior right upper lobe. 4. Asymmetric densities noted in both breasts. Mammography is suggest for further evaluation.   Electronically Signed By: Marcello Moores Register On: 10/02/2013 16:11    Filed Vitals:   10/02/13 1319  BP: 177/91  Pulse: 132  Temp:  98.6 F (37 C)  Resp: 16    Patient has been medically cleared by Ignacia Felling, PA-C and Dr. Dina Rich now that CTA is back without evidence of PE, patient will require f/u scan in 4-6 weeks for evaluation of infiltrate v mass lesion. Psych hold orders. Home medications ordered. TTS has been consulted and patient has been placed in Psych ED. Disposition pending TTS consultation. Patient d/w with Dr. Dina Rich, agrees with plan.    Harlow Mares, PA-C 10/02/13 1836

## 2013-10-02 NOTE — BH Assessment (Signed)
Disposition pending a TP by Conrad,NP.

## 2013-10-02 NOTE — Discharge Instructions (Signed)
Your CT scan showed some scarring vs a mass in your lungs. You need a repeat CT scan in 4-6 weeks that can be set up by your primary care physician.   Chest Pain (Nonspecific) It is often hard to give a specific diagnosis for the cause of chest pain. There is always a chance that your pain could be related to something serious, such as a heart attack or a blood clot in the lungs. You need to follow up with your caregiver for further evaluation. CAUSES   Heartburn.  Pneumonia or bronchitis.  Anxiety or stress.  Inflammation around your heart (pericarditis) or lung (pleuritis or pleurisy).  A blood clot in the lung.  A collapsed lung (pneumothorax). It can develop suddenly on its own (spontaneous pneumothorax) or from injury (trauma) to the chest.  Shingles infection (herpes zoster virus). The chest wall is composed of bones, muscles, and cartilage. Any of these can be the source of the pain.  The bones can be bruised by injury.  The muscles or cartilage can be strained by coughing or overwork.  The cartilage can be affected by inflammation and become sore (costochondritis). DIAGNOSIS  Lab tests or other studies, such as X-rays, electrocardiography, stress testing, or cardiac imaging, may be needed to find the cause of your pain.  TREATMENT   Treatment depends on what may be causing your chest pain. Treatment may include:  Acid blockers for heartburn.  Anti-inflammatory medicine.  Pain medicine for inflammatory conditions.  Antibiotics if an infection is present.  You may be advised to change lifestyle habits. This includes stopping smoking and avoiding alcohol, caffeine, and chocolate.  You may be advised to keep your head raised (elevated) when sleeping. This reduces the chance of acid going backward from your stomach into your esophagus.  Most of the time, nonspecific chest pain will improve within 2 to 3 days with rest and mild pain medicine. HOME CARE INSTRUCTIONS    If antibiotics were prescribed, take your antibiotics as directed. Finish them even if you start to feel better.  For the next few days, avoid physical activities that bring on chest pain. Continue physical activities as directed.  Do not smoke.  Avoid drinking alcohol.  Only take over-the-counter or prescription medicine for pain, discomfort, or fever as directed by your caregiver.  Follow your caregiver's suggestions for further testing if your chest pain does not go away.  Keep any follow-up appointments you made. If you do not go to an appointment, you could develop lasting (chronic) problems with pain. If there is any problem keeping an appointment, you must call to reschedule. SEEK MEDICAL CARE IF:   You think you are having problems from the medicine you are taking. Read your medicine instructions carefully.  Your chest pain does not go away, even after treatment.  You develop a rash with blisters on your chest. SEEK IMMEDIATE MEDICAL CARE IF:   You have increased chest pain or pain that spreads to your arm, neck, jaw, back, or abdomen.  You develop shortness of breath, an increasing cough, or you are coughing up blood.  You have severe back or abdominal pain, feel nauseous, or vomit.  You develop severe weakness, fainting, or chills.  You have a fever. THIS IS AN EMERGENCY. Do not wait to see if the pain will go away. Get medical help at once. Call your local emergency services (911 in [place] Do not drive yourself to the hospital. MAKE SURE YOU:   Understand these instructions.  Will watch your condition.  Will get help right away if you are not doing well or get worse. Document Released: 06/03/2005 Document Revised: 11/16/2011 Document Reviewed: 03/29/2008 [facility] Patient Information 2014 [place].

## 2013-10-02 NOTE — ED Notes (Addendum)
Pt brought in my [employer], IVC. Papers state "pt has been dx with paranoid-schizophrenia, diabetes, and HTN. Pt was recently IVC a few weeks ago. Pt is non compliant with medications telling mental health worker she would take her meds when she wants to. Currently pt is constipated x3 days. Pt refuses to seek medical help. Family reports she is refusing any kind of assistance and has not been showering or eating regularly. Pt believes someone is putting something in her food. Family is concerned with pt safety given her medical situation and her refusal to seek help.   Pt reports chest pain starting around 1200. Reports pain 6/10. Reports slight SOB.

## 2013-10-02 NOTE — ED Provider Notes (Signed)
7:15 PM Patient cleared from psych perspective, seems issues are between patient and daughter. Patient cleared for discharge. No SI/HI. No chest pain currently. Given that her CP started about 1 hour prior to arrival (over 6 hours ago), will check 1 more troponin. This is chest pain she gets daily but not worse than normal. Pain free for several hours. Feel at worst this is stable angina, will need close f/u with PCP, but no new symptoms or worsening sx to suggest this is unstable, NSTEMI or needing close cardiology monitoring. Patient understands risks and wants to be discharged. Discussed CT results and need for f/u CT to r/o mass. Patient understands, stable for home. Abd exam benign, will prescribe miralax as patient has not had BM in 3 days.  Ephraim Hamburger, MD 10/02/13 [phone]

## 2013-10-02 NOTE — BH Assessment (Signed)
Tele Assessment Note   Janet GirtRosa L Kim is an 68 y.o. female brought in by [employer] with a h/o of paranoid schizophrenia who presents to the Emergency Department for medical clearance. The initial complaint from pt's daughter/caregiver/financial POA is that this patient is extremelely constipated. Daughter states the constipation is probably from the pt taking 40 Tramadol, when she should have taken only 24 in that time frame. Daughter denies that this was a suicide attempt as well as the patient. No history of suicide attempts/gestures reported. No hx of self injurious behaviors. Patient denies anxiety and depression. Says that her only stressor is that her daughter steals her check. She would like to move out and get her own apt but says she can't do this b/c her daughter had her declared incompetent. She reports not having any ability to cash her check and feels upset with her daughter about this. Pt would like to hire a lawyer to have her daughter removed as her PAO. She denies HI. She also denies AVH's and associated symptoms. She sts, "I know something about hearing voices is in my chart but I don't know where it came from". Pt denies having a mental health history, specifically paranoid schizophrenia. Patient is alert and oriented to person, place, and situation. She was recently hospitalized at [facility] due to psychotic and paranoid behaviors. Since her return home patient has become non compliant with medications stating, "They burn my tongue". Additionally, she has not showered in a week or ate an appropriate amount of food. She reports a prior hospitalization at [facility] as well.   Collateral Information from Sheran SpineSheila Kim (patient's daughter/care giver/financial POA) [phone]:   Patient lives with her daughter, 67 yr old grandson, and 67 yr old grandson. She has lived with her daughter "almost 12 yrs". Prior to living with her daughter [place] homeless [place].   Patient's daughter sts that she is  concerned for her mothers well being. She reports paranoid behaviors in additional to defiance and rebilliousness.  Daughter sts that this is her 2nd time in the past month she has brought her mother to the ER for similar behaviors. The last ED visit led to hospitalization at [facility]. Daughter is concerned that patient was discharged home to early. Patient was discharged from [facility] last Tuesday. She was to [facility] medication management, in which they did.   Says the first day home from her hospitalization she refused medications. Additionally, she refuses to shower. She has not showered since discharge [facility]. Patient will not eat food due to paranoia. Daughter gave her mother a Pepsi, which patient asked for. Once patient received the pepsi she wouldn't drink it stating "It doesn't look right". Daughter sts that she will often do this, ask for food and then refuse it b/c it doesn't look right. Says that patient has "become rebellious" and argumentative daily not just about food but also about finances. Daughter sts she is constantly accused of stealing her mothers check as she has legal financial POA. Daughter feels that patient doesn't trust her at all, therefore; becomes angry/irritable with others in the home. During the night she doesn't sleep well knocking on bedroom doors of individuals in the home asking "Are you ok?". When patient is asked whey she does this she becomes angry and has no explanation.   Says that patient has become constipated over the last 3 days. Daughter tried to convince patient to got to the hospital for  her constipation. Patient has declined wanting to see a doctor. Daughter sts that patient is constipated due to over taking Tramadol.   Daughter is interested in her mother going into a assisted living facility. Says that her mother has become unmanageable and uncooperative which makes it difficult to care for her.    Axis I: Paranoid  Schizophrenia  Axis II: Deferred Axis III:  Past Medical History  Diagnosis Date  . Diabetes mellitus   . GERD (gastroesophageal reflux disease)   . Hyperlipidemia   . Hypertension   . Schizophrenia   . Atypical nevus   . History of mitral valve prolapse   . Breast cancer 07/14/06    Left breast. Ductal carcinoma, grade 3. Followed by Dr. Truddie Coco. s/p lumpectomy with radiation  . Goiter     U/S 08/2009-persistent multiple small nodules, one in the right lower pole slightly enlarged, recommend continued followup.  . Ovarian cyst   . Fibroids   . DVT (deep vein thrombosis) in pregnancy 1982    Affected the RLE. Required coumadin.  . Dental caries    Axis IV: other psychosocial or environmental problems, problems related to social environment, problems with access to health care services and problems with primary support group Axis V: 31-40 impairment in reality testing  Past Medical History:  Past Medical History  Diagnosis Date  . Diabetes mellitus   . GERD (gastroesophageal reflux disease)   . Hyperlipidemia   . Hypertension   . Schizophrenia   . Atypical nevus   . History of mitral valve prolapse   . Breast cancer 07/14/06    Left breast. Ductal carcinoma, grade 3. Followed by Dr. Truddie Coco. s/p lumpectomy with radiation  . Goiter     U/S 08/2009-persistent multiple small nodules, one in the right lower pole slightly enlarged, recommend continued followup.  . Ovarian cyst   . Fibroids   . DVT (deep vein thrombosis) in pregnancy 1982    Affected the RLE. Required coumadin.  . Dental caries     Past Surgical History  Procedure Laterality Date  . Lumpectory Left   . Tonsillectomy    . Tubal ligation    . Tubal ligation reversal    . Vagina surgery      Family History:  Family History  Problem Relation Age of Onset  . Diabetes Mother   . Diabetes Father     Social History:  reports that she has quit smoking. She has never used smokeless tobacco. She reports that she  does not drink alcohol or use illicit drugs.  Additional Social History:  Alcohol / Drug Use Pain Medications: SEE MAR Prescriptions: SEE MAR Over the Counter: SEE MAR History of alcohol / drug use?: No history of alcohol / drug abuse  CIWA: CIWA-Ar BP: 177/91 mmHg Pulse Rate: 115 COWS:    Allergies:  Allergies  Allergen Reactions  . Penicillins Hives and Itching    Home Medications:  (Not in a hospital admission)  OB/GYN Status:  No LMP recorded. Patient is postmenopausal.  General Assessment Data Location of Assessment: [facility] ED Is this a Tele or Face-to-Face Assessment?: Tele Assessment Is this an Initial Assessment or a Re-assessment for this encounter?: Initial Assessment Living Arrangements: Other (Comment) (Daughter, Daughter's boyfriend, 3 grand children) Can pt return to current living arrangement?: Yes Admission Status: Involuntary (by her daughter ) Is patient capable of signing voluntary admission?: No Transfer from: [facility] Referral Source: Self/Family/Friend     [place] Living Arrangements: Other (  Comment) (Daughter, Daughter's boyfriend, 3 grand children) Name of Psychiatrist: Warden/ranger Name of Therapist: None  Education Status Is patient currently in school?: No Current Grade:  (n/a) Highest grade of school patient has completed: NA Name of school: NA Contact person: NA  Risk to self Suicidal Ideation: No Suicidal Intent: No Is patient at risk for suicide?: No Suicidal Plan?: No Access to Means: No What has been your use of drugs/alcohol within the last 12 months?:  (patient denies) Previous Attempts/Gestures: No How many times?:  (0) Other Self Harm Risks:  (none identified ) Triggers for Past Attempts: None known Intentional Self Injurious Behavior: None Family Suicide History: No Recent stressful life event(s): Other (Comment) Persecutory voices/beliefs?: No Depression: No Depression Symptoms:  (pt denies ) Substance  abuse history and/or treatment for substance abuse?: No Suicide prevention information given to non-admitted patients: Not applicable  Risk to Others Homicidal Ideation: No Thoughts of Harm to Others: No Current Homicidal Intent: No Current Homicidal Plan: No Access to Homicidal Means: No Identified Victim:  (n/a) History of harm to others?: No Assessment of Violence: None Noted Violent Behavior Description:  (patient calm and cooperative ) Does patient have access to weapons?: No Criminal Charges Pending?: No Does patient have a court date: No  Psychosis Hallucinations: None noted Delusions: Unspecified (Pt belives that her medicines have acid in them)  Mental Status Report Appear/Hygiene: Disheveled Eye Contact: Good Motor Activity: Freedom of movement Speech: Logical/coherent Level of Consciousness: Alert Mood: Depressed Affect: Appropriate to circumstance Anxiety Level: None Judgement: Unimpaired Orientation: Person;Place;Time;Situation Obsessive Compulsive Thoughts/Behaviors: None  Cognitive Functioning Concentration: Normal Memory: Recent Intact;Remote Intact IQ: Average Insight: Fair Impulse Control: Fair Appetite: Fair Weight Loss:  (0) Weight Gain:  (0) Sleep: No Change Total Hours of Sleep:  (9 hrs per night ) Vegetative Symptoms: None  ADLScreening [facility] Assessment Services) Patient's cognitive ability adequate to safely complete daily activities?: Yes Patient able to express need for assistance with ADLs?: Yes Independently performs ADLs?: Yes (appropriate for developmental age)  Prior Inpatient Therapy Prior Inpatient Therapy: Yes Prior Therapy Dates: 2005 (2005 and admitted to [facility] in the last month ) Prior Therapy Facilty/Provider(s): [facility] and [facility] (recently discharged)) Reason for Treatment: Schizophrenia  Prior Outpatient Therapy Prior Outpatient Therapy: Yes Prior Therapy Dates: Ongoing Prior Therapy Facilty/Provider(s): [facility] Reason  for Treatment: Schizophrenia  ADL Screening (condition at time of admission) Patient's cognitive ability adequate to safely complete daily activities?: Yes Is the patient deaf or have difficulty hearing?: No Does the patient have difficulty seeing, even when wearing glasses/contacts?: No Does the patient have difficulty concentrating, remembering, or making decisions?: No Patient able to express need for assistance with ADLs?: Yes Does the patient have difficulty dressing or bathing?: No Independently performs ADLs?: Yes (appropriate for developmental age) Does the patient have difficulty walking or climbing stairs?: No Weakness of Legs: None Weakness of Arms/Hands: None  Home Assistive Devices/Equipment Home Assistive Devices/Equipment: None      Values / Beliefs Cultural Requests During Hospitalization: None Spiritual Requests During Hospitalization: None   Advance Directives (For Healthcare) Advance Directive: Patient does not have advance directive Nutrition Screen- [place] Adult/[facility] Patient's home diet: Regular  Additional Information 1:1 In Past 12 Months?: No CIRT Risk: No Elopement Risk: No Does patient have medical clearance?: Yes     Disposition:  Disposition Initial Assessment Completed for this Encounter: Yes Disposition of Patient: Other dispositions Other disposition(s): Other (Comment) (Pt to be evaluated by an extender/psychiatrist for recommend)  Evangeline Gula  10/02/2013 6:19 PM

## 2013-10-02 NOTE — ED Provider Notes (Signed)
CSN: [id number]     Arrival date & time 10/02/13  1248 History  This chart was scribed for non-physician practitioner, Sabra Heck, working with Merryl Hacker, MD by Celesta Gentile, ED Scribe. This patient was seen in room WTR5/WTR5 and the patient's care was started at 1:18 PM.    Chief Complaint  Patient presents with  . Medical Clearance   The history is provided by the patient and a relative. No language interpreter was used.   HPI Comments: [name] is a 68 y.o. female brought in by [employer] with a h/o of paranoid schizophrenia who presents to the Emergency Department for medical clearance.  Pt's daughter states that she has been extremelely constipated.  Daughter states the constipation is probably from the pt taking 40 Tramadol, when she should have taken only 24 in that time frame.  Pt reports that she was able to remove stool and denies any visible blood. Daughter states that the pt hasn't taken a shower since she was in the hospital, which was around  09/15/13.  She states that since she has been sick all week, she decided not to bathe.  Pt states that she is going to get an attorney to get her daughter removed from the power of attorney, because she is tired of being told what to do and pushed around.  Pt also complains of constant chest pain.  Pt has h/o of angina, but she states that the chest pain has worsened with associated SOB.  Pt denies any nausea and sweating.    Pt denies homicidal or suicidal thoughts.  She also denies hearing voices.  Daughter states the last time the pt took her schizophrenic medication was last night.  Pt states that she feels normal mentally.  Pt states that her appetite is normal, but her daughter disagrees.    Past Medical History  Diagnosis Date  . Diabetes mellitus   . GERD (gastroesophageal reflux disease)   . Hyperlipidemia   . Hypertension   . Schizophrenia   . Atypical nevus   . History of mitral valve prolapse   . Breast cancer  07/14/06    Left breast. Ductal carcinoma, grade 3. Followed by Dr. Truddie Coco. s/p lumpectomy with radiation  . Goiter     U/S 08/2009-persistent multiple small nodules, one in the right lower pole slightly enlarged, recommend continued followup.  . Ovarian cyst   . Fibroids   . DVT (deep vein thrombosis) in pregnancy 1982    Affected the RLE. Required coumadin.  . Dental caries    Past Surgical History  Procedure Laterality Date  . Lumpectory Left   . Tonsillectomy    . Tubal ligation    . Tubal ligation reversal    . Vagina surgery     Family History  Problem Relation Age of Onset  . Diabetes Mother   . Diabetes Father    History  Substance Use Topics  . Smoking status: Former Research scientist (life sciences)  . Smokeless tobacco: Never Used     Comment: quit 9 yrs ago.  . Alcohol Use: No   OB History   Grav Para Term Preterm Abortions TAB SAB Ect Mult Living                 Review of Systems  Constitutional: Negative for fever and chills.  HENT: Negative for congestion, rhinorrhea and sore throat.   Respiratory: Positive for shortness of breath. Negative for cough.   Cardiovascular: Positive for chest pain.  Gastrointestinal: Positive  for constipation. Negative for nausea, vomiting, abdominal pain, diarrhea and blood in stool.  Musculoskeletal: Negative for back pain.  Skin: Negative for color change and rash.  Neurological: Negative for syncope and headaches.  Psychiatric/Behavioral: Negative for suicidal ideas, hallucinations and self-injury.  All other systems reviewed and are negative.    Allergies  Penicillins  Home Medications   Current Outpatient Rx  Name  Route  Sig  Dispense  Refill  . aspirin 81 MG tablet   Oral   Take 1 tablet (81 mg total) by mouth daily.   30 tablet   11   . calcium-vitamin D (OSCAL 500/200 D-3) 500-200 MG-UNIT per tablet   Oral   Take 1 tablet by mouth 3 (three) times daily.   90 tablet   11   . LANTUS SOLOSTAR 100 UNIT/ML SOPN      INJECT 33  UNITS SUBCUTANEOUSLY EVERY DAY   3 pen   3   . lisinopril (PRINIVIL,ZESTRIL) 5 MG tablet   Oral   Take 1 tablet (5 mg total) by mouth daily.   90 tablet   3   . metFORMIN (GLUCOPHAGE) 1000 MG tablet   Oral   Take 1 tablet (1,000 mg total) by mouth 2 (two) times daily with a meal.   60 tablet   11   . metoprolol (LOPRESSOR) 50 MG tablet   Oral   Take 2 tablets (100 mg total) by mouth 2 (two) times daily.   360 tablet   1   . OLANZapine (ZYPREXA) 10 MG tablet      Take 1 tablet by mouth in the morning and 1 tablet in the evening         . pravastatin (PRAVACHOL) 40 MG tablet      TAKE ONE TABLET BY MOUTH EVERY DAY   30 tablet   6   . ranitidine (ZANTAC) 150 MG capsule   Oral   Take 1 capsule (150 mg total) by mouth 2 (two) times daily.   180 capsule   3   . traMADol (ULTRAM) 50 MG tablet      TAKE TWO TABLETS BY MOUTH EVERY 8 HOURS AS NEEDED FOR PAIN.   120 tablet   0    Triage Vitals: BP 177/91  Pulse 132  Temp(Src) 98.6 F (37 C) (Oral)  Resp 16  SpO2 96% Physical Exam  Nursing note and vitals reviewed. Constitutional: She is oriented to person, place, and time. She appears well-developed and well-nourished. No distress.  HENT:  Head: Normocephalic and atraumatic.  Right Ear: External ear normal.  Left Ear: External ear normal.  Nose: Nose normal.  Mouth/Throat: Oropharynx is clear and moist. No oropharyngeal exudate.  Multiple dental caries  Eyes: Conjunctivae are normal. Pupils are equal, round, and reactive to light.  Neck: Normal range of motion. Neck supple.  Cardiovascular: Regular rhythm and normal heart sounds.  Exam reveals no gallop and no friction rub.   No murmur heard. tachycardia  Pulmonary/Chest: Effort normal and breath sounds normal. No respiratory distress. She has no wheezes. She has no rales. She exhibits no tenderness.  Abdominal: Soft. Bowel sounds are normal. She exhibits no distension and no mass. There is tenderness. There  is no rebound and no guarding.  LUQ and LLQ abdominal tenderness, no guarding or rebound  Musculoskeletal: Normal range of motion. She exhibits no edema and no tenderness.  Lymphadenopathy:    She has no cervical adenopathy.  Neurological: She is alert and oriented to person,  place, and time. She exhibits normal muscle tone. Coordination normal.  Skin: Skin is warm and dry. No rash noted. No erythema. No pallor.  Psychiatric: She has a normal mood and affect. Her behavior is normal. Judgment and thought content normal.    ED Course  Procedures (including critical care time) DIAGNOSTIC STUDIES: Oxygen Saturation is 96% on RA, adequate by my interpretation.    COORDINATION OF CARE: 1:36 PM-Patient informed of current plan of treatment and evaluation and agrees with plan.    Labs Review Labs Reviewed - No data to display Imaging Review No results found.  EKG Interpretation   None       MDM  Atypical Chest pain Medication non-compliance constipation  I personally performed the services described in this documentation, which was scribed in my presence. The recorded information has been reviewed and is accurate.    Idalia Needle Joelyn Oms, PA-C 10/02/13 1343
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
## 2013-10-02 NOTE — ED Notes (Signed)
Bed: OEU23 Expected date:  Expected time:  Means of arrival:  Comments: Room 20

## 2013-10-02 NOTE — ED Notes (Signed)
Patient transported to X-ray 

## 2013-10-03 NOTE — ED Provider Notes (Signed)
Medical screening examination/treatment/procedure(s) were conducted as a shared visit with non-physician practitioner(s) and myself.  I personally evaluated the patient during the encounter.  EKG Interpretation    Date/Time:  Monday October 02 2013 13:18:16 EST Ventricular Rate:  131 PR Interval:  137 QRS Duration: 75 QT Interval:  340 QTC Calculation: 502 R Axis:   42 Text Interpretation:  Sinus tachycardia Probable LVH with secondary repol abnrm Anterior Q waves, possibly due to LVH Prolonged QT interval tachycardia when compared to prior EKG Confirmed by Dina Rich  MD, Loma Sousa ([id number]) on 10/02/2013 3:16:25 PM            Patient IVC'd by daughter.  Complaining of CP, which is a chronic complaint for the patient.  Tachycardic on exam.  EKG nonischemia.  Dimer +.  CTA neg.  W/U neg.  Patient medically clear for TTS eval.  Merryl Hacker, MD 10/03/13 1348

## 2013-10-03 NOTE — ED Provider Notes (Signed)
Medical screening examination/treatment/procedure(s) were performed by non-physician practitioner and as supervising physician I was immediately available for consultation/collaboration.  EKG Interpretation    Date/Time:  Monday October 02 2013 13:18:16 EST Ventricular Rate:  131 PR Interval:  137 QRS Duration: 75 QT Interval:  340 QTC Calculation: 502 R Axis:   42 Text Interpretation:  Sinus tachycardia Probable LVH with secondary repol abnrm Anterior Q waves, possibly due to LVH Prolonged QT interval tachycardia when compared to prior EKG Confirmed by Dina Rich  MD, Laura Radilla ([id number]) on 10/02/2013 3:16:25 PM             Merryl Hacker, MD 10/03/13 1347

## 2013-10-05 DIAGNOSIS — M25519 Pain in unspecified shoulder: Secondary | ICD-10-CM | POA: Diagnosis not present

## 2013-10-08 DIAGNOSIS — M25519 Pain in unspecified shoulder: Secondary | ICD-10-CM | POA: Diagnosis not present

## 2013-10-09 ENCOUNTER — Encounter: Payer: Medicare Other | Admitting: Internal Medicine

## 2013-10-16 ENCOUNTER — Encounter: Payer: Self-pay | Admitting: Internal Medicine

## 2013-10-16 ENCOUNTER — Ambulatory Visit (INDEPENDENT_AMBULATORY_CARE_PROVIDER_SITE_OTHER): Payer: Medicare Other | Admitting: Internal Medicine

## 2013-10-16 VITALS — BP 151/86 | HR 74 | Temp 98.7°F | Ht 67.5 in | Wt 182.1 lb

## 2013-10-16 DIAGNOSIS — R928 Other abnormal and inconclusive findings on diagnostic imaging of breast: Secondary | ICD-10-CM

## 2013-10-16 DIAGNOSIS — R918 Other nonspecific abnormal finding of lung field: Secondary | ICD-10-CM

## 2013-10-16 DIAGNOSIS — N6489 Other specified disorders of breast: Secondary | ICD-10-CM

## 2013-10-16 DIAGNOSIS — E119 Type 2 diabetes mellitus without complications: Secondary | ICD-10-CM

## 2013-10-16 DIAGNOSIS — K5900 Constipation, unspecified: Secondary | ICD-10-CM | POA: Insufficient documentation

## 2013-10-16 DIAGNOSIS — C50912 Malignant neoplasm of unspecified site of left female breast: Secondary | ICD-10-CM

## 2013-10-16 DIAGNOSIS — R911 Solitary pulmonary nodule: Secondary | ICD-10-CM

## 2013-10-16 DIAGNOSIS — N63 Unspecified lump in unspecified breast: Secondary | ICD-10-CM

## 2013-10-16 LAB — GLUCOSE, CAPILLARY: Glucose-Capillary: 116 mg/dL — ABNORMAL HIGH (ref 70–99)

## 2013-10-16 LAB — POCT GLYCOSYLATED HEMOGLOBIN (HGB A1C): Hemoglobin A1C: 6.4

## 2013-10-16 LAB — HM DIABETES EYE EXAM

## 2013-10-16 MED ORDER — INSULIN GLARGINE 100 UNIT/ML SOLOSTAR PEN: 33.0000 [IU] | PEN_INJECTOR | Freq: Every day | SUBCUTANEOUS | Status: DC

## 2013-10-16 NOTE — Assessment & Plan Note (Signed)
Assessment: This is an incidental finding on chest CT on 10/02/13. IMPRESSION:  1. No evidence of pulmonary embolus.  2. Below COPD.  3. Ill-defined density in the anterior aspect of the right upper  lobe. This most likely represents a focal area of scarring. A  follow-up chest CT in 4-6 weeks suggested for further evaluation to  exclude developing infiltrate or mass lesion. Similar finding to a  lesser degree also noted in the posterior right upper lobe.  4. Asymmetric densities noted in both breasts. Mammography is  suggest for further evaluation.  Plan: - repeat Mammogram--needs to get the medicare approval.  - last mammogram was normal in April 2014.

## 2013-10-16 NOTE — Assessment & Plan Note (Signed)
Assessment: She was evaluated for the constipation at ED on 10/02/13 and states that her constipation is resolved. The etiology is associated with taking too many opiates than her scheduled dosage. She requested for me to give her more opiates.  Plan: - Will not give her refill since it is too early - instruct patient only take it as prescribed. Her daughter who is also her HOA states that she would handle patient's opiates now. - patient and daughter agrees with plan.

## 2013-10-16 NOTE — Patient Instructions (Signed)
1. Will order chest CT for follow up .please come on March 20th. 2. Will discuss with coder about ordering the mammogram,. 3. Will follow up in one month

## 2013-10-16 NOTE — Progress Notes (Signed)
Patient ID: [name], female   DOB: 1946/06/21, 68 y.o.   MRN: [id number]    Patient: [name]   MRN: [id number]  DOB: [birth year]  PCP: Sid Falcon, MD   Subjective:    CC: Follow-up and Medication Refill   HPI: [name] is a 68 y.o. female with a PMHx of T2DM, HTN, HLD< GERD, Schizophrenia, atypical chest pain, left sided breast cancer in remission, who presented to clinic today for the following:  1) ED follow up Patient was evaluated for atypical chest pain and constipation at the ED on 10/02/13. Patient had negative troponin x 2 and EKG was unremarkable for ischemia, and her CTA chest was negative for PE.  Her constipation was likely related to chronic opiates. Patient is on Tramadol for chronic pain, and she took a total of 40 tabs instead of 24 tabs last month since she was taking 2 tab Q4h PRN instead of Q8h PRN. She subsequently developed constipation.  Patient was medically cleared and Telepsych consultation was done. It was recommended for her to have laxatives to facilitate BM, and a follow up with [facility] for family counseling and review of medications.   Patient states that she is doing well except for her intermittent chest sharp pain. She describes that her pain is similar to the one in the past. No other associated symptoms.   CTA chest report as following:  IMPRESSION:  1. No evidence of pulmonary embolus.  2. Below COPD.  3. Ill-defined density in the anterior aspect of the right upper  lobe. This most likely represents a focal area of scarring. A  follow-up chest CT in 4-6 weeks suggested for further evaluation to  exclude developing infiltrate or mass lesion. Similar finding to a  lesser degree also noted in the posterior right upper lobe.  4. Asymmetric densities noted in both breasts. Mammography is  suggest for further evaluation.  Health maintenance: 1. Influenza and pneumococcal vaccine--declined 2. A1C--today 3. Retina  camera--today  Review of Systems: Per HPI.   Current Outpatient Medications: Current Outpatient Prescriptions  Medication Sig Dispense Refill  . aspirin 81 MG tablet Take 1 tablet (81 mg total) by mouth daily.  30 tablet  11  . calcium-vitamin D (OSCAL 500/200 D-3) 500-200 MG-UNIT per tablet Take 1 tablet by mouth 3 (three) times daily.  90 tablet  11  . hydrOXYzine (VISTARIL) 100 MG capsule Take 100 mg by mouth at bedtime.      . Insulin Glargine (LANTUS SOLOSTAR) 100 UNIT/ML Solostar Pen Inject 33 Units into the skin daily.  15 mL  2  . lisinopril (PRINIVIL,ZESTRIL) 5 MG tablet Take 1 tablet (5 mg total) by mouth daily.  90 tablet  3  . metFORMIN (GLUCOPHAGE) 1000 MG tablet Take 1 tablet (1,000 mg total) by mouth 2 (two) times daily with a meal.  60 tablet  11  . metoprolol (LOPRESSOR) 50 MG tablet Take 2 tablets (100 mg total) by mouth 2 (two) times daily.  360 tablet  1  . OLANZapine (ZYPREXA) 10 MG tablet Take 25 mg by mouth at bedtime.      . polyethylene glycol (MIRALAX / GLYCOLAX) packet Take 17 g by mouth daily.  14 each  0  . pravastatin (PRAVACHOL) 40 MG tablet Take 40 mg by mouth daily.      . QUEtiapine (SEROQUEL) 50 MG tablet Take 150 mg by mouth at bedtime.      . ranitidine (ZANTAC) 150 MG capsule Take  1 capsule (150 mg total) by mouth 2 (two) times daily.  180 capsule  3  . traMADol (ULTRAM) 50 MG tablet Take 100 mg by mouth every 8 (eight) hours as needed for moderate pain.       No current facility-administered medications for this visit.    Allergies: Allergies  Allergen Reactions  . Penicillins Hives and Itching    Past Medical History  Diagnosis Date  . Diabetes mellitus   . GERD (gastroesophageal reflux disease)   . Hyperlipidemia   . Hypertension   . Schizophrenia   . Atypical nevus   . History of mitral valve prolapse   . Breast cancer 07/14/06    Left breast. Ductal carcinoma, grade 3. Followed by Dr. Truddie Coco. s/p lumpectomy with radiation  . Goiter      U/S 08/2009-persistent multiple small nodules, one in the right lower pole slightly enlarged, recommend continued followup.  . Ovarian cyst   . Fibroids   . DVT (deep vein thrombosis) in pregnancy 1982    Affected the RLE. Required coumadin.  . Dental caries     Objective:    Physical Exam: Filed Vitals:   10/16/13 1453 10/16/13 1535  BP: 144/76 151/86  Pulse: 79 74  Temp: 98.7 F (37.1 C)   TempSrc: Oral   Height: 5' 7.5" (1.715 m)   Weight: 182 lb 1.6 oz (82.6 kg)   SpO2: 99%      General: Vital signs reviewed and noted. Well-developed, well-nourished, in no acute distress; alert, appropriate and cooperative throughout examination.  Head: breasts Normocephalic, atraumatic. Left breast: well healed scar noted on upper lateral quadrant. A 1 x 2 cm round rubbery mobile nodule noted on upper lateral quadrant. No skin swelling, erythema, tenderness to palpation and warmth to touch.  No nipple discharge. No lymphoadenopathy appreciated.  Right breast: no nodules noted. No nipple discharge. No skin abnormality noted.   Lungs:  Normal respiratory effort. Clear to auscultation BL without crackles or wheezes.  Heart: RRR. S1 and S2 normal without gallop, rubs, murmur.  Abdomen:  BS normoactive. Soft, Nondistended, non-tender.  No masses or organomegaly.  Extremities: No pretibial edema.    Assessment/ Plan:

## 2013-10-16 NOTE — Assessment & Plan Note (Signed)
Assessment: This is an incidental finding on CTA chest for the evaluation of PE.   Chest CT showed   1. No evidence of pulmonary embolus.  2. Below COPD.  3. Ill-defined density in the anterior aspect of the right upper  lobe. This most likely represents a focal area of scarring. A  follow-up chest CT in 4-6 weeks suggested for further evaluation to  exclude developing infiltrate or mass lesion. Similar finding to a  lesser degree also noted in the posterior right upper lobe.  4. Asymmetric densities noted in both breasts. Mammography is  suggest for further evaluation.  Plan: - follow up on chest CT W/O in 4-6 weeks--order entered.  - patient and family understands and agrees with the plan.

## 2013-10-17 ENCOUNTER — Encounter: Payer: Self-pay | Admitting: Internal Medicine

## 2013-10-17 NOTE — Addendum Note (Signed)
Addended byNicoletta Dress, Tywaun Hiltner on: 10/17/2013 11:43 AM   Modules accepted: Orders

## 2013-10-17 NOTE — Progress Notes (Signed)
Case discussed with Dr. Li at the time of the visit.  We reviewed the resident's history and exam and pertinent patient test results.  I agree with the assessment, diagnosis, and plan of care documented in the resident's note. 

## 2013-10-20 ENCOUNTER — Encounter: Payer: Self-pay | Admitting: Oncology

## 2013-10-25 ENCOUNTER — Other Ambulatory Visit: Payer: Self-pay | Admitting: Internal Medicine

## 2013-10-25 DIAGNOSIS — Z1231 Encounter for screening mammogram for malignant neoplasm of breast: Secondary | ICD-10-CM | POA: Diagnosis not present

## 2013-10-25 DIAGNOSIS — Z780 Asymptomatic menopausal state: Secondary | ICD-10-CM | POA: Diagnosis not present

## 2013-10-25 DIAGNOSIS — Z853 Personal history of malignant neoplasm of breast: Secondary | ICD-10-CM | POA: Diagnosis not present

## 2013-10-25 NOTE — Telephone Encounter (Signed)
Just filled this today, sent to [facility].

## 2013-10-25 NOTE — Telephone Encounter (Signed)
Tramadol rx called to [facility]. 

## 2013-10-26 ENCOUNTER — Encounter: Payer: Self-pay | Admitting: Family Medicine

## 2013-10-27 ENCOUNTER — Other Ambulatory Visit: Payer: Self-pay | Admitting: Internal Medicine

## 2013-10-27 DIAGNOSIS — N63 Unspecified lump in unspecified breast: Secondary | ICD-10-CM

## 2013-10-27 DIAGNOSIS — N6489 Other specified disorders of breast: Secondary | ICD-10-CM

## 2013-10-31 ENCOUNTER — Encounter: Payer: Self-pay | Admitting: Family Medicine

## 2013-11-01 ENCOUNTER — Encounter: Payer: Self-pay | Admitting: Family Medicine

## 2013-11-01 DIAGNOSIS — M8580 Other specified disorders of bone density and structure, unspecified site: Secondary | ICD-10-CM | POA: Insufficient documentation

## 2013-11-05 DIAGNOSIS — M25519 Pain in unspecified shoulder: Secondary | ICD-10-CM | POA: Diagnosis not present

## 2013-11-07 ENCOUNTER — Telehealth: Payer: Self-pay | Admitting: Oncology

## 2013-11-07 NOTE — Telephone Encounter (Signed)
, °

## 2013-11-08 ENCOUNTER — Other Ambulatory Visit: Payer: Self-pay

## 2013-11-08 ENCOUNTER — Other Ambulatory Visit: Payer: Self-pay | Admitting: *Deleted

## 2013-11-08 DIAGNOSIS — C50912 Malignant neoplasm of unspecified site of left female breast: Secondary | ICD-10-CM

## 2013-11-09 ENCOUNTER — Ambulatory Visit: Payer: Medicare Other | Admitting: Adult Health

## 2013-11-09 ENCOUNTER — Ambulatory Visit: Payer: Medicare Other | Admitting: Oncology

## 2013-11-09 ENCOUNTER — Telehealth: Payer: Self-pay | Admitting: Oncology

## 2013-11-09 ENCOUNTER — Other Ambulatory Visit: Payer: Medicare Other

## 2013-11-09 ENCOUNTER — Other Ambulatory Visit: Payer: Self-pay

## 2013-11-09 NOTE — Telephone Encounter (Signed)
, °

## 2013-11-11 ENCOUNTER — Other Ambulatory Visit: Payer: Self-pay | Admitting: Physician Assistant

## 2013-11-11 DIAGNOSIS — C50319 Malignant neoplasm of lower-inner quadrant of unspecified female breast: Secondary | ICD-10-CM

## 2013-11-14 ENCOUNTER — Other Ambulatory Visit (HOSPITAL_BASED_OUTPATIENT_CLINIC_OR_DEPARTMENT_OTHER): Payer: Medicare Other

## 2013-11-14 DIAGNOSIS — C50312 Malignant neoplasm of lower-inner quadrant of left female breast: Secondary | ICD-10-CM

## 2013-11-14 DIAGNOSIS — C50319 Malignant neoplasm of lower-inner quadrant of unspecified female breast: Secondary | ICD-10-CM | POA: Diagnosis not present

## 2013-11-14 LAB — COMPREHENSIVE METABOLIC PANEL (CC13)
ALBUMIN: 3.7 g/dL (ref 3.5–5.0)
ALK PHOS: 52 U/L (ref 40–150)
ALT: 20 U/L (ref 0–55)
AST: 19 U/L (ref 5–34)
Anion Gap: 10 mEq/L (ref 3–11)
BUN: 12.6 mg/dL (ref 7.0–26.0)
CALCIUM: 9.5 mg/dL (ref 8.4–10.4)
CHLORIDE: 106 meq/L (ref 98–109)
CO2: 26 mEq/L (ref 22–29)
Creatinine: 0.9 mg/dL (ref 0.6–1.1)
Glucose: 103 mg/dl (ref 70–140)
POTASSIUM: 4.8 meq/L (ref 3.5–5.1)
Sodium: 143 mEq/L (ref 136–145)
Total Bilirubin: 0.35 mg/dL (ref 0.20–1.20)
Total Protein: 6.8 g/dL (ref 6.4–8.3)

## 2013-11-14 LAB — CBC WITH DIFFERENTIAL/PLATELET
BASO%: 0.3 % (ref 0.0–2.0)
BASOS ABS: 0 10*3/uL (ref 0.0–0.1)
EOS ABS: 0.1 10*3/uL (ref 0.0–0.5)
EOS%: 1 % (ref 0.0–7.0)
HCT: 35.8 % (ref 34.8–46.6)
HGB: 11.6 g/dL (ref 11.6–15.9)
LYMPH%: 29.5 % (ref 14.0–49.7)
MCH: 28.4 pg (ref 25.1–34.0)
MCHC: 32.5 g/dL (ref 31.5–36.0)
MCV: 87.2 fL (ref 79.5–101.0)
MONO#: 0.4 10*3/uL (ref 0.1–0.9)
MONO%: 7.3 % (ref 0.0–14.0)
NEUT%: 61.9 % (ref 38.4–76.8)
NEUTROS ABS: 3.8 10*3/uL (ref 1.5–6.5)
Platelets: 248 10*3/uL (ref 145–400)
RBC: 4.1 10*6/uL (ref 3.70–5.45)
RDW: 13.6 % (ref 11.2–14.5)
WBC: 6.1 10*3/uL (ref 3.9–10.3)
lymph#: 1.8 10*3/uL (ref 0.9–3.3)

## 2013-11-17 ENCOUNTER — Ambulatory Visit
Admission: RE | Admit: 2013-11-17 | Discharge: 2013-11-17 | Disposition: A | Discharge status: Home / Self Care | Payer: Medicare Other | Source: Ambulatory Visit | Attending: Internal Medicine | Admitting: Internal Medicine

## 2013-11-17 DIAGNOSIS — N63 Unspecified lump in unspecified breast: Secondary | ICD-10-CM

## 2013-11-17 DIAGNOSIS — N6489 Other specified disorders of breast: Secondary | ICD-10-CM

## 2013-11-20 ENCOUNTER — Other Ambulatory Visit: Payer: Self-pay

## 2013-11-20 ENCOUNTER — Encounter: Payer: Medicare Other | Admitting: Adult Health

## 2013-11-20 ENCOUNTER — Telehealth: Payer: Self-pay | Admitting: *Deleted

## 2013-11-20 NOTE — Telephone Encounter (Signed)
Pt daughter called to rs the pt missed appt today to 11/23/13 w/labs@ 11amand ov@ 11:30am. ....td

## 2013-11-21 ENCOUNTER — Telehealth: Payer: Self-pay | Admitting: *Deleted

## 2013-11-21 ENCOUNTER — Ambulatory Visit (HOSPITAL_BASED_OUTPATIENT_CLINIC_OR_DEPARTMENT_OTHER): Payer: Medicare Other | Admitting: Hematology and Oncology

## 2013-11-21 VITALS — BP 131/70 | HR 89 | Temp 98.7°F | Resp 18 | Ht 64.75 in | Wt 161.2 lb

## 2013-11-21 DIAGNOSIS — Z170 Estrogen receptor positive status [ER+]: Secondary | ICD-10-CM

## 2013-11-21 DIAGNOSIS — C50319 Malignant neoplasm of lower-inner quadrant of unspecified female breast: Secondary | ICD-10-CM | POA: Diagnosis not present

## 2013-11-21 DIAGNOSIS — N951 Menopausal and female climacteric states: Secondary | ICD-10-CM

## 2013-11-21 DIAGNOSIS — I890 Lymphedema, not elsewhere classified: Secondary | ICD-10-CM | POA: Diagnosis not present

## 2013-11-21 DIAGNOSIS — M8580 Other specified disorders of bone density and structure, unspecified site: Secondary | ICD-10-CM

## 2013-11-21 NOTE — Progress Notes (Signed)
This encounter was created in error - please disregard.

## 2013-11-21 NOTE — Telephone Encounter (Signed)
appts made and printed. Pt always have a podiatrist that she see...td

## 2013-11-22 ENCOUNTER — Other Ambulatory Visit: Payer: Self-pay | Admitting: Internal Medicine

## 2013-11-22 MED ORDER — TRAMADOL HCL 50 MG PO TABS: 100.0000 mg | ORAL_TABLET | Freq: Three times a day (TID) | ORAL | Status: DC | PRN

## 2013-11-23 ENCOUNTER — Other Ambulatory Visit (HOSPITAL_BASED_OUTPATIENT_CLINIC_OR_DEPARTMENT_OTHER): Payer: Medicare Other

## 2013-11-23 ENCOUNTER — Encounter: Payer: Self-pay | Admitting: Adult Health

## 2013-11-23 ENCOUNTER — Ambulatory Visit (HOSPITAL_BASED_OUTPATIENT_CLINIC_OR_DEPARTMENT_OTHER): Payer: Medicare Other | Admitting: Adult Health

## 2013-11-23 VITALS — BP 184/95 | HR 82 | Temp 98.2°F | Resp 18 | Ht 67.5 in | Wt 189.1 lb

## 2013-11-23 DIAGNOSIS — Z853 Personal history of malignant neoplasm of breast: Secondary | ICD-10-CM

## 2013-11-23 DIAGNOSIS — C50912 Malignant neoplasm of unspecified site of left female breast: Secondary | ICD-10-CM

## 2013-11-23 DIAGNOSIS — M949 Disorder of cartilage, unspecified: Secondary | ICD-10-CM

## 2013-11-23 DIAGNOSIS — R911 Solitary pulmonary nodule: Secondary | ICD-10-CM

## 2013-11-23 DIAGNOSIS — M899 Disorder of bone, unspecified: Secondary | ICD-10-CM

## 2013-11-23 DIAGNOSIS — R61 Generalized hyperhidrosis: Secondary | ICD-10-CM

## 2013-11-23 LAB — CBC WITH DIFFERENTIAL/PLATELET
BASO%: 0.4 % (ref 0.0–2.0)
Basophils Absolute: 0 10*3/uL (ref 0.0–0.1)
EOS%: 2.8 % (ref 0.0–7.0)
Eosinophils Absolute: 0.1 10*3/uL (ref 0.0–0.5)
HCT: 37.5 % (ref 34.8–46.6)
HGB: 12.1 g/dL (ref 11.6–15.9)
LYMPH#: 1.4 10*3/uL (ref 0.9–3.3)
LYMPH%: 28.5 % (ref 14.0–49.7)
MCH: 27.6 pg (ref 25.1–34.0)
MCHC: 32.1 g/dL (ref 31.5–36.0)
MCV: 85.9 fL (ref 79.5–101.0)
MONO#: 0.5 10*3/uL (ref 0.1–0.9)
MONO%: 9.7 % (ref 0.0–14.0)
NEUT#: 2.8 10*3/uL (ref 1.5–6.5)
NEUT%: 58.6 % (ref 38.4–76.8)
Platelets: 252 10*3/uL (ref 145–400)
RBC: 4.37 10*6/uL (ref 3.70–5.45)
RDW: 12.9 % (ref 11.2–14.5)
WBC: 4.8 10*3/uL (ref 3.9–10.3)

## 2013-11-23 LAB — COMPREHENSIVE METABOLIC PANEL (CC13)
ALT: 25 U/L (ref 0–55)
ANION GAP: 8 meq/L (ref 3–11)
AST: 18 U/L (ref 5–34)
Albumin: 4 g/dL (ref 3.5–5.0)
Alkaline Phosphatase: 93 U/L (ref 40–150)
BUN: 13.1 mg/dL (ref 7.0–26.0)
CHLORIDE: 106 meq/L (ref 98–109)
CO2: 26 mEq/L (ref 22–29)
Calcium: 9.4 mg/dL (ref 8.4–10.4)
Creatinine: 0.7 mg/dL (ref 0.6–1.1)
Glucose: 141 mg/dl — ABNORMAL HIGH (ref 70–140)
POTASSIUM: 4.4 meq/L (ref 3.5–5.1)
Sodium: 140 mEq/L (ref 136–145)
TOTAL PROTEIN: 7.8 g/dL (ref 6.4–8.3)
Total Bilirubin: 0.62 mg/dL (ref 0.20–1.20)

## 2013-11-23 NOTE — Progress Notes (Signed)
[place]  [phone] Fax:(336) [phone]  OFFICE PROGRESS NOTE  PATIENT: [name]   DOB: 1946/04/18  MR#: [id number]  OZH#:[id number]   GE:XBMWUX, EMILY, MD   DIAGNOSIS: 68 year old [place], [place] woman with invasive ductal carcinoma of the left breast and was diagnosed in 2007.  PRIOR THERAPY: 1. Status post left breast lumpectomy with left axillary biopsy on 08/12/2006 with 0/1 positive lymph nodes for a T1c N0, ER 67%, PR 99%, Ki-67 14% HER-2/neu negative, invasive ductal carcinoma of the left breast (1.2 cm).  2. Oncotype score 6 with rate of distant recurrence at 10 years of 5%.  3. Radiation therapy from 10/11/2006 through 11/29/2006.  4. S/p adjuvant antiestrogen therapy with Arimidex from 12/01/2006 through 11/2012.  CURRENT THERAPY:  observation  INTERVAL HISTORY: Patient is here for f/u today of her h/o invasive ductal carcinoma of the left breast.  She is doing moderately well today.  She recently had a CTA due to chest pain, and an abnormal chest x ray.  There was an ill defined pulmonary nodule that will be followed up by repeat CT scan tomorrow.  She does have ongoing chest pain that she takes NTG and ASA for which helps.  She has occasional shortness of breath from time to time.  She used to be on oxygen for this, but now she is off of oxygen and doing well.  Otherwise, she denies any new pain.  Her health maintenance was updated below. A 10 point ROS is otherwise neg.    PAST MEDICAL HISTORY: Past Medical History  Diagnosis Date  . Diabetes mellitus   . GERD (gastroesophageal reflux disease)   . Hyperlipidemia   . Hypertension   . Schizophrenia   . Atypical nevus   . History of mitral valve prolapse   . Breast cancer 07/14/06    Left breast. Ductal carcinoma, grade 3. Followed by Dr. Truddie Coco. s/p lumpectomy with radiation  . Goiter     U/S 08/2009-persistent multiple small nodules, one in the right lower pole slightly  enlarged, recommend continued followup.  . Ovarian cyst   . Fibroids   . DVT (deep vein thrombosis) in pregnancy 1982    Affected the RLE. Required coumadin.  . Dental caries     PAST SURGICAL HISTORY: Past Surgical History  Procedure Laterality Date  . Lumpectory Left   . Tonsillectomy    . Tubal ligation    . Tubal ligation reversal    . Vagina surgery       FAMILY HISTORY: Family History  Problem Relation Age of Onset  . Diabetes Mother   . Diabetes Father     SOCIAL HISTORY: History  Substance Use Topics  . Smoking status: Former Research scientist (life sciences)  . Smokeless tobacco: Never Used     Comment: quit 9 yrs ago.  . Alcohol Use: No    ALLERGIES: Allergies  Allergen Reactions  . Penicillins Hives and Itching     MEDICATIONS:  Current Outpatient Prescriptions  Medication Sig Dispense Refill  . aspirin 81 MG tablet Take 1 tablet (81 mg total) by mouth daily.  30 tablet  11  . calcium-vitamin D (OSCAL 500/200 D-3) 500-200 MG-UNIT per tablet Take 1 tablet by mouth 3 (three) times daily.  90 tablet  11  . Insulin Glargine (LANTUS SOLOSTAR) 100 UNIT/ML Solostar Pen Inject 33 Units into the skin daily.  15 mL  2  . lisinopril (PRINIVIL,ZESTRIL) 5 MG tablet Take 1 tablet (5 mg total) by mouth daily.  90 tablet  3  . metFORMIN (GLUCOPHAGE) 1000 MG tablet Take 1 tablet (1,000 mg total) by mouth 2 (two) times daily with a meal.  60 tablet  11  . metoprolol (LOPRESSOR) 50 MG tablet Take 2 tablets (100 mg total) by mouth 2 (two) times daily.  360 tablet  1  . OLANZapine (ZYPREXA) 10 MG tablet Take 30 mg by mouth at bedtime.       . pravastatin (PRAVACHOL) 40 MG tablet Take 40 mg by mouth daily.      . QUEtiapine (SEROQUEL) 50 MG tablet Take 150 mg by mouth at bedtime.      . ranitidine (ZANTAC) 150 MG capsule Take 1 capsule (150 mg total) by mouth 2 (two) times daily.  180 capsule  3  . polyethylene glycol (MIRALAX / GLYCOLAX) packet Take 17 g by mouth daily.  14 each  0  . traMADol  (ULTRAM) 50 MG tablet Take 2 tablets (100 mg total) by mouth every 8 (eight) hours as needed for moderate pain.  120 tablet  5   No current facility-administered medications for this visit.      REVIEW OF SYSTEMS: A 10 point review of systems was completed and is negative except as noted above.   Health Maintenance  Mammogram: 11/17/13 Colonoscopy: patient declines test Bone Density Scan: 10/2013 Pap Smear: patient declines test Eye Exam: 2 months ago Vitamin D Level: takes Vitamin D3 2,000 daily Lipid Panel: 3 months ago    PHYSICAL EXAMINATION: BP 184/95  Pulse 82  Temp(Src) 98.2 F (36.8 C) (Oral)  Resp 18  Ht 5' 7.5" (1.715 m)  Wt 189 lb 1.6 oz (85.775 kg)  BMI 29.16 kg/m2  GENERAL: Patient is a well appearing female in no acute distress HEENT:  Sclerae anicteric.  Oropharynx clear and moist. No ulcerations or evidence of oropharyngeal candidiasis. Neck is supple.  NODES:  No cervical, supraclavicular, or axillary lymphadenopathy palpated.  BREAST EXAM: left breast s/p lumpectomy, no nodularity to incision or skin changes, nodularity, or mass, right breast with no nodules, mass, skin changes.  Benign breast exam.   LUNGS:  Clear to auscultation bilaterally.  No wheezes or rhonchi. HEART:  Regular rate and rhythm. No murmur appreciated. ABDOMEN:  Soft, nontender.  Positive, normoactive bowel sounds. No organomegaly palpated. MSK:  No focal spinal tenderness to palpation. Full range of motion bilaterally in the upper extremities. EXTREMITIES:  No peripheral edema.   SKIN:  Clear with no obvious rashes or skin changes. No nail dyscrasia. NEURO:  Nonfocal. Well oriented.  Appropriate affect. ECOG FS:  Grade 1 - Symptomatic but completely ambulatory   LAB RESULTS: Lab Results  Component Value Date   WBC 4.8 11/23/2013   NEUTROABS 2.8 11/23/2013   HGB 12.1 11/23/2013   HCT 37.5 11/23/2013   MCV 85.9 11/23/2013   PLT 252 11/23/2013      Chemistry      Component Value  Date/Time   NA 138 10/02/2013 1351   NA 139 11/08/2012 1521   K 3.8 10/02/2013 1351   K 3.4* 11/08/2012 1521   CL 99 10/02/2013 1351   CL 103 11/08/2012 1521   CO2 24 10/02/2013 1351   CO2 26 11/08/2012 1521   BUN 8 10/02/2013 1351   BUN 7.4 11/08/2012 1521   CREATININE 0.54 10/02/2013 1351   CREATININE 0.64 02/13/2013 0955   CREATININE 0.6 11/08/2012 1521      Component Value Date/Time   CALCIUM 9.8 10/02/2013 1351   CALCIUM 9.4  11/08/2012 1521   ALKPHOS 94 10/02/2013 1351   ALKPHOS 118 11/08/2012 1521   AST 20 10/02/2013 1351   AST 23 11/08/2012 1521   ALT 22 10/02/2013 1351   ALT 25 11/08/2012 1521   BILITOT 0.9 10/02/2013 1351   BILITOT 0.64 11/08/2012 1521       Lab Results  Component Value Date   LABCA2 29 09/16/2011     RADIOGRAPHIC STUDIES: No results found.  ASSESSMENT: 68 y.o.[place],  [place] woman with:  1. T1c N0, invasive ductal carcinoma of the left breast, grade 2, ER 67%, PR 99%, Ki-67 14% HER-2/neu negative, Oncotype score of 6 with rate of distant recurrence at 10 years of 5%, status post lumpectomy with axillary node biopsy on 08/12/2006, 0/1 positive lymph nodes, and status post radiation therapy completed on 11/29/2006.  Patient was then placed on Adjuvant anti-estrogen therapy in 11/2006 and completed it in 11/2012.    2. Hot flashes/night sweats  3. Osteopenia:  Last bone density in February, 2015.  PLAN: 1.  The patient is doing well today.  She has no sign of recurrence.  She would like to start following up with [place] on an as needed basis which is reasonable.    2.  She will proceed with the CT scan of the chest to follow up on her lung nodule.  They will call us if the results are concerning.    3.  I reviewed her health maintenance above and recommended a colonoscopy and pap smear.    4.  I counseled the patient on survivorship, healthy diet, exercise, and self breast exams.  She will continue to follow up with her PCP.  She and her daughter know to call if she  needs anything whatsoever.    All questions were answered.  The patient was encouraged to contact us in the interim with any problems, questions or concerns.   I spent 25 minutes counseling the patient face to face.  The total time spent in the appointment was 30 minutes.  Minette Headland, [place] [phone] 11/23/2013, 11:42 AM

## 2013-11-23 NOTE — Patient Instructions (Signed)
You are doing well.  You have no sign of recurrence.  I recommend you have a colonoscopy and pap smear.  Continue to f/u with your PCP as scheduled.  We will see you back on an as needed basis.  Please call us if you have any questions or concerns.

## 2013-11-28 ENCOUNTER — Telehealth: Payer: Self-pay | Admitting: *Deleted

## 2013-11-28 NOTE — Telephone Encounter (Signed)
Talked with Adela Lank - daughter of pt about appt for CT chest  sch 11/30/13  1:30PM [facility] - no restrictions. Hilda Blades Jameyah Fennewald RN 11/28/13 2:25PM

## 2013-11-29 ENCOUNTER — Encounter: Payer: Self-pay | Admitting: Hematology and Oncology

## 2013-11-29 ENCOUNTER — Ambulatory Visit: Payer: Self-pay | Admitting: Internal Medicine

## 2013-11-29 NOTE — Progress Notes (Signed)
.a ID: Cynthia Griffin   DOB: [birth year]  MR#: [id number]  EHM#:[id number]  PCP: Eliezer Lofts, MD GYN:  SU: Stark Klein OTHER MD: Kyung Rudd, Johnnette Gourd, Crissie Reese  ALERT: Another patient with the same date of birth, middle initial, and also with a history of breast cancer is in actinic. Please make sure when dealing with Cynthia Griffin that you have the correct medical record number   CHIEF COMPLAINT:  Hx of Left Breast Cancer  HISTORY OF PRESENT ILLNESS: The patient tells me she had an unremarkable screening mammogram January of 2013 in [place]. I do not have that report. Shortly after that, February of 2013, she noted that her left nipple had become inverted. She initially thought this was due to the mammogram itself, but eventually brought it to her primary care physician's attention. This was followed and there was no apparent change when she was next seen December 2013. The patient however became concerned and decided to go back to [facility] for her annual mammography This is where she had her annual mammograms until the last couple of years. Bilateral diagnostic mammography and left ultrasonography there 10/24/2012 showed definite nipple deformity with inversion and retraction. There was subtle architectural distortion and increase in density relative to the preceding study. Left breast ultrasonography in addition to the nipple retraction and inversion showed an area of hyperechoic architectural distortion measuring 1.4 cm in diameter with Doppler flow. Breast specific gamma imaging was obtained February 2024 and confirmed an area of moderate intensity isotope activity following a radial distribution from the left nipple measuring at least 4 cm. 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  Bilateral breast MRI was obtained 10/31/2012 and showed an area of diffuse non-masslike asymmetric enhancement in the inferior aspect of the left breast measuring up to 7.4 cm transversely. There were no enlarged axillary or internal mammary nodes noted and the right breast was unremarkable.   The patient's subsequent history is as detailed below.  INTERVAL HISTORY: Cynthia Griffin returns alone today for followup of her left breast cancers.She started  on  Tamoxifen in late October. She's tolerating the medication well, her biggest complaint being some increased hot flashes. These occur . She's had no vaginal changes, specifically no increased dryness, discharge, or abnormal bleeding. Her energy level is  Average.Marland KitchenHer weight is down by 4 pounds.she denies visual changes or dyspnea.she notes nail changes and she thinks her nails are faling of.  Cynthia Griffin continues to have pain in the right shoulder for which she underwent a shoulder MRI in early November. She's been followed by Dr. Onnie Graham, and is scheduled for surgery next Friday, January 9.    REVIEW OF SYSTEMS:  Maylani has had no recent illnesses and denies any fevers She denies any skin changes or rashes and has had no abnormal bruising, bleeding  Her appetite is good and she denies any nausea or change in bowel or bladder  She's had no new cough,shortness of breath, chest pain, palpitations, or peripheral swelling. She denies any abnormal headaches, change in vision or dizziness. Her peripheral neuropathy hasresolved. She has some occasional postsurgical pain in the left chest wall and left axillary region. She denies myalgias, arthralgias, or bony pain.   A detailed review of systems today was otherwise noncontributory   PAST MEDICAL HISTORY: Past Medical History  Diagnosis  Date  . Arthritis   . Anxiety   . Carpal tunnel syndrome of right wrist   . Family history of anesthesia complication     pneumothorax - post op 1980's  . Breast cancer 11/14/12     left, ER +, PR -, HER 2 -  . Pneumothorax on right March, 2014    while having port-a-cath placed  . Pneumonia   . 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 Anemia   . Hx of radiation therapy 05/09/13-06/22/13    left breast 60.4Gy    PAST SURGICAL HISTORY: Past Surgical History  Procedure Laterality Date  . Appendectomy    . Tonsillectomy    . Meniscus repair      Right Knee  . Mastectomy w/ sentinel node biopsy Left 11/14/2012    Procedure: MASTECTOMY WITH SENTINEL LYMPH NODE BIOPSY;  Surgeon: Stark Klein, MD;  Location: [facility];  Service: General;  Laterality: Left;  . Axillary lymph node dissection Left 12/01/2012    Procedure: AXILLARY LYMPH NODE DISSECTION;  Surgeon: Stark Klein, MD;  Location: [facility];  Service: General;  Laterality: Left;  . Portacath placement Right 12/01/2012    Procedure: INSERTION PORT-A-CATH;  Surgeon: Stark Klein, MD;  Location: [facility];  Service: General;  Laterality: Right;  . Abdominal hysterectomy  1992    fibroids, total  . Mastectomy Left December 20, 2012  . Eye surgery Bilateral 2006    lasik surgery  . Port-a-cath removal N/A 05/02/2013    Procedure: REMOVAL PORT-A-CATH;  Surgeon: Stark Klein, MD;  Location: [facility];  Service: General;  Laterality: N/A;   right shoulder rotator cuff repair, right hand carpal tunnel syndrome, bilateral salpingo--oophorectomy with her hysterectomy in the 1990s, Lasix surgery, right toe surgery  FAMILY HISTORY Family History  Problem Relation Age of Onset  . Bladder Cancer Father 6  . Lung cancer Father 28  . Diabetes Father   . Bone cancer Paternal Uncle     ? late 37s  . Diabetes Mother   . Peripheral vascular disease Mother   . Multiple sclerosis Sister   . Parkinson's disease Paternal Grandmother   . Heart disease Paternal Grandfather    the patient's father died at the age of 41. He was diagnosed with lung cancer the age of 59 and with bladder  cancer at the age of 32. He was a smoker. The patient's mother is living at age 41. She has a history of basal cell carcinoma. Cynthia Griffin had no brothers, one sister. There is no history of breast or ovarian cancer in the immediate family.  GYNECOLOGIC HISTORY: Menarche age 74, first live birth age 88, she is Cynthia Griffin. She had her hysterectomy she thinks in 1992, and took hormone replacement for approximately 18 years. In addition she took birth control pills between 1968 and 1977 stopping only to conceive.  SOCIAL HISTORY: She is a retired Web designer. Her husband Cynthia Griffin (goes by Cynthia Griffin) is in Press photographer for at [employer] in [place]. Son Cynthia Griffin lives in [place] and works in Pharmacologist. Son Cynthia Griffin lives in [place] and works in Press photographer. The patient has 5 grandchildren" one on the way".   ADVANCED DIRECTIVES: In place  HEALTH MAINTENANCE:  (Updated 09/04/2013) History  Substance Use Topics  . Smoking status: Former Smoker -- 1.25 packs/day for 29 years    Types: Cigarettes    Quit date: 09/07/1992  .  Smokeless tobacco: Never Used  . Alcohol Use: 4.2 oz/week    7 Glasses of wine per week     Colonoscopy: August 2003  PAP: Status post hysterectomy  Bone density: Never - scheduled for Feb 2015  Lipid panel: Dr. Diona Browner   Allergies  Allergen Reactions  . Sulfa Antibiotics Nausea Only and Other (See Comments)    Stomach cramps    Current Outpatient Prescriptions  Medication Sig Dispense Refill  . Biotin 5000 MCG CAPS Take 5,000 mcg by mouth daily.      . calcium citrate-vitamin D (CITRACAL+D) 315-200 MG-UNIT per tablet Take 1 tablet by mouth 2 (two) times daily.       . citalopram (CELEXA) 40 MG tablet Take 40 mg by mouth daily.      . Glucosamine-Chondroit-Vit C-Mn (GLUCOSAMINE CHONDR 1500 COMPLX PO) Take 1 capsule by mouth daily.      . Multiple Vitamin (MULTIVITAMIN WITH MINERALS) TABS Take 1 tablet by mouth daily.      .  non-metallic deodorant Jethro Poling) MISC Apply 1 application topically daily as needed.      . tamoxifen (NOLVADEX) 20 MG tablet TAKE 1 TABLET (20 MG TOTAL) BY MOUTH DAILY.  30 tablet  0  . HYDROcodone-acetaminophen (NORCO) 5-325 MG per tablet Take 1-2 tablets by mouth every 8 (eight) hours as needed for pain.  30 tablet  0   No current facility-administered medications for this visit.    OBJECTIVE: Middle-aged white woman who appears well and is in no acute distress Filed Vitals:   11/21/13 1048  BP: 131/70  Pulse: 89  Temp: 98.7 F (37.1 C)  Resp: 18     Body mass index is 27.02 kg/(m^2).    ECOG FS: 0  Filed Weights   11/21/13 1048  Weight: 161 lb 3.2 oz (73.12 kg)   Physical Exam: HEENT:  Sclerae anicteric.  Oropharynx clear and moist. Good dentition. Neck is supple. NODES:  No cervical or supraclavicular lymphadenopathy BREAST EXAM:  Deferred. Axillae are benign bilaterally, no palpable lymphadenopathy. LUNGS:  Clear to auscultation bilaterally with good excursion.  No wheezes or rhonchi HEART:  Regular rate and rhythm. No murmur  ABDOMEN:  Soft, nontender to palpation.  Positive, normoactive bowel sounds.  MSK:  No focal spinal tenderness to palpation.  Range of motion remains limited in the right shoulder secondary to discomfort. There is no tenderness to palpation or swelling noted in the right shoulder.Good range of motion in the left upper extremity. EXTREMITIES:  No peripheral edema.   NEURO:  Nonfocal. Well oriented.  Positive affect.   LAB RESULTS: Lab Results  Component Value Date   WBC 6.1 11/14/2013   NEUTROABS 3.8 11/14/2013   HGB 11.6 11/14/2013   HCT 35.8 11/14/2013   MCV 87.2 11/14/2013   PLT 248 11/14/2013   CMP     Component Value Date/Time   NA 143 11/14/2013 1137   NA 143 05/01/2013 1048   K 4.8 11/14/2013 1137   K 4.0 05/01/2013 1048   CL 106 05/01/2013 1048   CL 107 02/21/2013 1028   CO2 26 11/14/2013 1137   CO2 28 05/01/2013 1048   GLUCOSE 103 11/14/2013  1137   GLUCOSE 69* 05/01/2013 1048   GLUCOSE 97 02/21/2013 1028   BUN 12.6 11/14/2013 1137   BUN 11 05/01/2013 1048   CREATININE 0.9 11/14/2013 1137   CREATININE 0.90 07/12/2013 1400   CALCIUM 9.5 11/14/2013 1137   CALCIUM 9.4 05/01/2013 1048   PROT 6.8 11/14/2013 1137  ALBUMIN 3.7 11/14/2013 1137   AST 19 11/14/2013 1137   ALT 20 11/14/2013 1137   ALKPHOS 52 11/14/2013 1137   BILITOT 0.35 11/14/2013 1137   GFRNONAA 66* 07/12/2013 1400   GFRAA 76* 07/12/2013 1400    STUDIES: No results found.  Patient is due for her next right screening mammogram at [facility] in February, and will have a baseline bone density exam at the same time.    ASSESSMENT: 68 y.o. [place] woman   (1)  status post left mastectomy and axillary lymph node dissection March 2014 for a pT3 pN2, stage IIIA invasive lobular carcinoma, grade 1, estrogen receptor 100% positive, progesterone receptor and HER-2 negative, with an MIB-1 of 8%.  (2) R pneumothorax with port placement March 2014- resolved  (3)  treated in the adjuvant setting with docetaxel/ doxorubicin/ cyclophosphamide, the original plan being to complete 6 q. three-week doses, with Neulasta given on day 2 for granulocyte support  (4) switched to carboplatin and gemcitabine given day 1 only for cycles 5 and 6 of chemotherapy because of concerns regarding developing neuropathy; completed 04/04/2013  (5)  lymphedema, left upper extremity, grade 1  (6) status post postmastectomy radiation, completed mid October  (7)  started on tamoxifen, 20 mg daily, in late October 2014  (8)   Reconstruction also to follow approximately 6 months after radiation has been completed.   PLAN:  Patient is overall tolerating Tamoxifen well without significant side effects to continue.Given refill. Mammogram 10/25/2013 negative.  Bone density in 10/25/2013 osteopenia T score -2 needs follow up in 2 years. Patient needs to take   Calcium 600 mg 2 daily and vit D 2000 IU daily. Will  draw Vit D level on return.   We will initiate q. 6 month visits, alternating appointments with Dr. Cher Nakai.Call for any new concerns.  CBC,CMP,Vit D level in 6 months. Cynthia Griffin voices understanding and agreement with the above plan. She knows to call with any changes or problems prior to her next scheduled appointment.   Cynthia Griffin, Cynthia Griffin  Oncology/Hematology    11/29/2013

## 2013-11-30 ENCOUNTER — Encounter (HOSPITAL_COMMUNITY): Payer: Self-pay

## 2013-11-30 ENCOUNTER — Ambulatory Visit (HOSPITAL_COMMUNITY)
Admission: RE | Admit: 2013-11-30 | Discharge: 2013-11-30 | Disposition: A | Discharge status: Home / Self Care | Payer: Medicare Other | Source: Ambulatory Visit | Attending: Internal Medicine | Admitting: Internal Medicine

## 2013-11-30 DIAGNOSIS — R911 Solitary pulmonary nodule: Secondary | ICD-10-CM | POA: Insufficient documentation

## 2013-11-30 DIAGNOSIS — Z853 Personal history of malignant neoplasm of breast: Secondary | ICD-10-CM | POA: Insufficient documentation

## 2013-11-30 DIAGNOSIS — R918 Other nonspecific abnormal finding of lung field: Secondary | ICD-10-CM

## 2013-12-04 ENCOUNTER — Encounter: Payer: Self-pay | Admitting: Internal Medicine

## 2013-12-04 ENCOUNTER — Ambulatory Visit (INDEPENDENT_AMBULATORY_CARE_PROVIDER_SITE_OTHER): Payer: Medicare Other | Admitting: Internal Medicine

## 2013-12-04 VITALS — BP 160/82 | HR 72 | Temp 97.6°F | Ht 70.0 in | Wt 192.5 lb

## 2013-12-04 DIAGNOSIS — R918 Other nonspecific abnormal finding of lung field: Secondary | ICD-10-CM

## 2013-12-04 DIAGNOSIS — I10 Essential (primary) hypertension: Secondary | ICD-10-CM

## 2013-12-04 DIAGNOSIS — F209 Schizophrenia, unspecified: Secondary | ICD-10-CM

## 2013-12-04 DIAGNOSIS — N63 Unspecified lump in unspecified breast: Secondary | ICD-10-CM

## 2013-12-04 DIAGNOSIS — E119 Type 2 diabetes mellitus without complications: Secondary | ICD-10-CM

## 2013-12-04 LAB — GLUCOSE, CAPILLARY: Glucose-Capillary: 77 mg/dL (ref 70–99)

## 2013-12-04 NOTE — Progress Notes (Signed)
   Subjective:    Patient ID: [name], female    DOB: Apr 18, 1946, 68 y.o.   MRN: [id number]  HPI  Pt with hx significant for DM, schizophrenia, hypertension, intraductal carcinoma of the breast s/p rad/chem/ adjuvant therapy 11/2012 and lung nodule demonstrated on CXR Jan 2015. She presents for results of f/u CT Scan of Chest results from 11/30/2013 which demonstrated no changes and recommended f/u CXR in 6 months (Sept 2015). States that she has been doing well and without complaints.   Review of Systems  Constitutional: Negative.   HENT: Negative.   Respiratory: Negative.   Cardiovascular: Positive for chest pain.       Chest pain "now and again"  Gastrointestinal: Positive for constipation. Negative for blood in stool.  Genitourinary: Negative.   Musculoskeletal: Negative.   Skin: Negative.   Neurological: Negative.   Hematological: Does not bruise/bleed easily.  Psychiatric/Behavioral: Negative.        Schizophrenic, stable       Objective:   Physical Exam  Constitutional: She is oriented to person, place, and time. She appears well-developed and well-nourished. No distress.  Son and daughter present  HENT:  Head: Normocephalic and atraumatic.  Eyes: Conjunctivae and EOM are normal. Pupils are equal, round, and reactive to light.  Neck: Normal range of motion. Neck supple. No thyromegaly present.  Cardiovascular: Normal rate, regular rhythm, normal heart sounds and intact distal pulses.   Pulmonary/Chest: Effort normal and breath sounds normal. She has no wheezes.  Abdominal: Soft. Bowel sounds are normal. There is no tenderness.  Musculoskeletal: Normal range of motion. She exhibits no edema and no tenderness.  Neurological: She is alert and oriented to person, place, and time.  Skin: Skin is warm and dry.  Psychiatric: She has a normal mood and affect.          Assessment & Plan:  See separate problem-list charting for detailed A/P:  -lung nodule: stable,  f/u CXR 6 months (Sept 2015) -DM; controlled, f/u 2-3 months PCP -schizophrenia: stable on seroquel

## 2013-12-04 NOTE — Progress Notes (Signed)
Case discussed with Dr. Schooler soon after the resident saw the patient.  We reviewed the resident's history and exam and pertinent patient test results.  I agree with the assessment, diagnosis, and plan of care documented in the resident's note. 

## 2013-12-04 NOTE — Assessment & Plan Note (Addendum)
Lab Results  Component Value Date   HGBA1C 6.4 10/16/2013   HGBA1C 6.9 02/13/2013   HGBA1C 7.6 09/12/2012     Assessment: Diabetes control: good control (HgbA1C at goal) Progress toward A1C goal:  at goal Comments:   Plan: Medications:  continue current medications Home glucose monitoring: Frequency: 2 times a day Timing: before meals Instruction/counseling given: reminded to bring medications to each visit Educational resources provided: brochure;handout Self management tools provided:   Other plans: cont metformin 1000 mg bid and lantus 33 units qd

## 2013-12-04 NOTE — Assessment & Plan Note (Signed)
Stable on seroquel.

## 2013-12-04 NOTE — Assessment & Plan Note (Signed)
BP Readings from Last 3 Encounters:  12/04/13 160/82  11/23/13 184/95  10/16/13 151/86    Lab Results  Component Value Date   NA 140 11/23/2013   K 4.4 11/23/2013   CREATININE 0.7 11/23/2013    Assessment: Blood pressure control: controlled Progress toward BP goal:  at goal Comments:   Plan: Medications:  continue current medications Educational resources provided: brochure;handout;video Self management tools provided:   Other plans: lisinopril 5mg  qd, metoprolol 100 mg bid

## 2013-12-04 NOTE — Assessment & Plan Note (Signed)
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 Repeat CT Chest in 6 months to demonstrate continued stability suggested.

## 2013-12-04 NOTE — Patient Instructions (Addendum)
General Instructions:  Your CT Scan of the chest did NOT show evidence of cancer.  It appears to be some scarring. It is suggested to have another Chest XRay in 6 months. Your mammogram did NOT show cancer. It is suggested that you have another mammogram in 1 year. It is highly recommended that your get a colonoscopy.  You have declined to have this procedure. If you change your mind please let the clinic know and we will be happy to schedule a referral for you. Please bring your medicines with you each time you come.   Medicines may be  Eye drops  Herbal   Vitamins  Pills  Seeing these help [place] take care of you.    Treatment Goals:  Goals (1 Years of Data) as of 12/04/13         As of Today 11/23/13 10/16/13 10/16/13 10/02/13     Blood Pressure    . Blood Pressure < 140/90  160/82 184/95 151/86 144/76 164/91     Result Component    . HEMOGLOBIN A1C < 7.0    6.4      . LDL CALC < 100            Progress Toward Treatment Goals:  Treatment Goal 12/04/2013  Hemoglobin A1C at goal  Blood pressure at goal    Self Care Goals & Plans:  Self Care Goal 12/04/2013  Manage my medications take my medicines as prescribed; bring my medications to every visit; refill my medications on time; follow the sick day instructions if I am sick  Monitor my health keep track of my blood glucose; check my feet daily  Eat healthy foods eat more vegetables; eat fruit for snacks and desserts; eat baked foods instead of fried foods; eat foods that are low in salt; eat smaller portions; drink diet soda or water instead of juice or soda  Be physically active find an activity I enjoy    Home Blood Glucose Monitoring 02/13/2013  Check my blood sugar once a day     Care Management & Community Referrals:  Referral 02/13/2013  Referrals made for care management support none needed

## 2013-12-06 DIAGNOSIS — M25519 Pain in unspecified shoulder: Secondary | ICD-10-CM | POA: Diagnosis not present

## 2013-12-13 ENCOUNTER — Other Ambulatory Visit: Payer: Self-pay | Admitting: Oncology

## 2013-12-13 DIAGNOSIS — C50919 Malignant neoplasm of unspecified site of unspecified female breast: Secondary | ICD-10-CM

## 2013-12-15 ENCOUNTER — Telehealth (INDEPENDENT_AMBULATORY_CARE_PROVIDER_SITE_OTHER): Payer: Self-pay

## 2013-12-15 ENCOUNTER — Encounter (INDEPENDENT_AMBULATORY_CARE_PROVIDER_SITE_OTHER): Payer: Self-pay | Admitting: General Surgery

## 2013-12-15 ENCOUNTER — Ambulatory Visit (INDEPENDENT_AMBULATORY_CARE_PROVIDER_SITE_OTHER): Payer: Medicare Other | Admitting: General Surgery

## 2013-12-15 VITALS — BP 126/80 | HR 78 | Temp 97.5°F | Ht 64.0 in | Wt 160.0 lb

## 2013-12-15 DIAGNOSIS — C50319 Malignant neoplasm of lower-inner quadrant of unspecified female breast: Secondary | ICD-10-CM

## 2013-12-15 NOTE — Assessment & Plan Note (Signed)
No clinical evidence of disease.  Followup in November of 2015.  Continue tamoxifen  I will make referral for left arm physical therapy.  Patient is going to get colonoscopy referral from her primary care physician.

## 2013-12-15 NOTE — Progress Notes (Signed)
HISTORY: Patient is a 68 year old female approximately 13 months status post left modified radical mastectomy.  She underwent radiation and is on tamoxifen. She is doing well overall. She is tolerating the tamoxifen fine. She did receive adjuvant chemotherapy. She developed severe right shoulder pain after her steroids down from her chemotherapy. She underwent right-sided rotator cuff surgery in January of this year. She is going to physical therapy for that. She does have a lot of tightness in her left axilla and some swelling in her left arm. She states it's very faint but that she can tell that her arm is in her hand is slightly swollen. She denies any pain. She is looking forward to getting reconstruction. She is following him with Dr. Harlow Mares this summer.   PERTINENT REVIEW OF SYSTEMS: O/w negative x 11.   Filed Vitals:   12/15/13 1112  BP: 126/80  Pulse: 78  Temp: 97.5 F (36.4 C)   Wt Readings from Last 3 Encounters:  12/15/13 160 lb (72.576 kg)  11/21/13 161 lb 3.2 oz (73.12 kg)  09/04/13 165 lb 1.6 oz (74.889 kg)    EXAM: Head: Normocephalic and atraumatic.  Eyes:  Conjunctivae are normal. Pupils are equal, round, and reactive to light. No scleral icterus.  Neck:  Normal range of motion. Neck supple. No tracheal deviation present. No thyromegaly present.  Resp: No respiratory distress, normal effort. Breast:  Left breast surgically absent.  No palpable nodules.  No axillary LAD.  Sl contracture left axilla.  Sl decreased abduction left shoulder.  Tr-1+ lymphedema left upper arm.  Right breast dense, no palpable masses or skin dimpling.  No nipple retraction.  No axillary, supra or infraclavicular adenopathy.   Abd:  Abdomen is soft, non distended and non tender. No masses are palpable.  There is no rebound and no guarding.  Neurological: Alert and oriented to person, place, and time. Coordination normal.  Skin: Skin is warm and dry. No rash noted. No diaphoretic. No erythema. No  pallor.  Psychiatric: Normal mood and affect. Normal behavior. Judgment and thought content normal.      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  Followup in November of 2015.  Continue tamoxifen  I will make referral for left arm physical therapy.  Patient is going to get colonoscopy referral from her primary care physician.      Milus Height, MD Surgical Oncology, [facility]  Eliezer Lofts, MD Jinny Sanders, MD

## 2013-12-15 NOTE — Telephone Encounter (Signed)
LM to call [facility] to set up PT referral.

## 2013-12-15 NOTE — Patient Instructions (Signed)
Get colonoscopy.  We will make PT referral for left arm.  Follow up with me in November 2015.

## 2013-12-20 ENCOUNTER — Other Ambulatory Visit (INDEPENDENT_AMBULATORY_CARE_PROVIDER_SITE_OTHER): Payer: Self-pay | Admitting: General Surgery

## 2013-12-20 ENCOUNTER — Encounter (INDEPENDENT_AMBULATORY_CARE_PROVIDER_SITE_OTHER): Payer: Self-pay

## 2013-12-20 DIAGNOSIS — C50919 Malignant neoplasm of unspecified site of unspecified female breast: Secondary | ICD-10-CM

## 2013-12-20 DIAGNOSIS — B359 Dermatophytosis, unspecified: Secondary | ICD-10-CM | POA: Diagnosis not present

## 2013-12-20 NOTE — Progress Notes (Unsigned)
Patient ID: Cynthia Griffin, female   DOB: 03-13-1946, 68 y.o.   MRN: [id number] Pt will be scheduled to see [facility] PT in [place]. They will contact the patient. Referral and office note faxed today.

## 2014-01-19 ENCOUNTER — Encounter: Payer: Self-pay | Admitting: *Deleted

## 2014-01-25 ENCOUNTER — Encounter (INDEPENDENT_AMBULATORY_CARE_PROVIDER_SITE_OTHER): Payer: Self-pay

## 2014-01-25 NOTE — Progress Notes (Unsigned)
Patient ID: Cynthia Griffin, female   DOB: 09-Jan-1946, 68 y.o.   MRN: [id number] Pt referred to PT for tightness and swelling in her left axilla and arm.  She has met her Medicare cap for PT and cannot schedule any more PT visits this year.  She used all of them after rotator cuff surgery.  She will f/u with [place] prn.

## 2014-01-30 DIAGNOSIS — C50919 Malignant neoplasm of unspecified site of unspecified female breast: Secondary | ICD-10-CM | POA: Diagnosis not present

## 2014-01-31 ENCOUNTER — Telehealth: Payer: Self-pay | Admitting: *Deleted

## 2014-01-31 NOTE — Telephone Encounter (Signed)
Patient called and left message that she has a suspicious lump that she would like Dr Jana Hakim to advise upon. Patient has had recent mammo, showing scattered fibrous tissue.  Voicemail identified as patient, informed patient to leave area alone for a couple days, this could just be irritated fibrous tissues, but if she is still concerned, she should call us back to [facility]. Instructed to call me back with additional questions.

## 2014-02-01 ENCOUNTER — Other Ambulatory Visit: Payer: Self-pay | Admitting: *Deleted

## 2014-02-01 NOTE — Progress Notes (Unsigned)
This RN spoke with pt per her call wanting to clarify concern over new area of concern.  Lump is on the left side " under the clavicle and I know I had several lymphnodes removed from that side ". Note pt had a total mastectomy with lymph node dissection.  Lump is approximately 2 inches in size and " elongated " non tender to touch.  Cynthia Griffin noted lump about 1 week ago.   Above will be given to MD for review for appropriate recommendation.  Return call number for pt is [phone].

## 2014-02-02 ENCOUNTER — Telehealth: Payer: Self-pay | Admitting: *Deleted

## 2014-02-02 ENCOUNTER — Telehealth (INDEPENDENT_AMBULATORY_CARE_PROVIDER_SITE_OTHER): Payer: Self-pay

## 2014-02-02 NOTE — Telephone Encounter (Signed)
Cynthia Griffin, from Cynthia Griffin's office calling to make appt for Cynthia Griffin. Cynthia Griffin felt a 2in nodule over her incision site from the lymph node biopsy on her left breast. Informed Cynthia Griffin that our office would call the pt Monday if there were any openings that became available. Will send this message to Cynthia Griffin and her nurse. Cynthia Griffin stated that she would call the pt to let her know.

## 2014-02-02 NOTE — Telephone Encounter (Signed)
This RN called pt per MD review with recommendation due to location of nodule best if surgeon could assess for possible biopsy.  Cynthia Griffin verbalized understanding.  This RN contacted Dr Marlowe Aschoff at [facility] and requested an appointment ASAP per above concern.  Per discussion with triage - they will call pt on Monday am with an appointment.  This RN called and informed pt of above.

## 2014-02-05 ENCOUNTER — Telehealth (INDEPENDENT_AMBULATORY_CARE_PROVIDER_SITE_OTHER): Payer: Self-pay

## 2014-02-05 NOTE — Telephone Encounter (Signed)
Scheduled pt to see Dr. Barry Dienes on 02/15/14 at 3:00 p.m.

## 2014-02-09 ENCOUNTER — Encounter (INDEPENDENT_AMBULATORY_CARE_PROVIDER_SITE_OTHER): Payer: Medicare Other | Admitting: General Surgery

## 2014-02-12 DIAGNOSIS — H2510 Age-related nuclear cataract, unspecified eye: Secondary | ICD-10-CM | POA: Diagnosis not present

## 2014-02-15 ENCOUNTER — Ambulatory Visit (INDEPENDENT_AMBULATORY_CARE_PROVIDER_SITE_OTHER): Payer: Medicare Other | Admitting: General Surgery

## 2014-02-15 ENCOUNTER — Encounter (INDEPENDENT_AMBULATORY_CARE_PROVIDER_SITE_OTHER): Payer: Self-pay | Admitting: General Surgery

## 2014-02-15 VITALS — BP 124/76 | HR 75 | Temp 97.8°F | Ht 64.0 in | Wt 161.0 lb

## 2014-02-15 DIAGNOSIS — Z853 Personal history of malignant neoplasm of breast: Secondary | ICD-10-CM

## 2014-02-15 DIAGNOSIS — C50319 Malignant neoplasm of lower-inner quadrant of unspecified female breast: Secondary | ICD-10-CM

## 2014-02-15 DIAGNOSIS — C50919 Malignant neoplasm of unspecified site of unspecified female breast: Secondary | ICD-10-CM

## 2014-02-16 ENCOUNTER — Telehealth (INDEPENDENT_AMBULATORY_CARE_PROVIDER_SITE_OTHER): Payer: Self-pay | Admitting: *Deleted

## 2014-02-16 ENCOUNTER — Other Ambulatory Visit (INDEPENDENT_AMBULATORY_CARE_PROVIDER_SITE_OTHER): Payer: Self-pay | Admitting: General Surgery

## 2014-02-16 DIAGNOSIS — N63 Unspecified lump in unspecified breast: Secondary | ICD-10-CM

## 2014-02-16 NOTE — Telephone Encounter (Signed)
LM for pt regarding her appt for [place] at [facility], which is scheduled for February 21, 2014.  Advise pt to arrive at 11:30 a.m and to take her insurance card and photo id.  Thanks!  Anderson Malta

## 2014-02-18 NOTE — Progress Notes (Signed)
HISTORY: Patient is a 68 year old female approximately 16 months status post left modified radical mastectomy.  She underwent radiation and is on tamoxifen. She is tolerating the tamoxifen fine. She did receive adjuvant chemotherapy. Pt has been doing PT and has improved her range of motion since her left rotator cuff was repaired.   She is concerned about some thickening of the upper breast and a possible nodule on her chest wall on the left.  She had had right mammogram which has been fine.    PERTINENT REVIEW OF SYSTEMS: O/w negative x 11.   Filed Vitals:   02/15/14 1454  BP: 124/76  Pulse: 75  Temp: 97.8 F (36.6 C)   Wt Readings from Last 3 Encounters:  02/15/14 161 lb (73.029 kg)  12/15/13 160 lb (72.576 kg)  11/21/13 161 lb 3.2 oz (73.12 kg)    EXAM: Head: Normocephalic and atraumatic.  Eyes:  Conjunctivae are normal. Pupils are equal, round, and reactive to light. No scleral icterus.  Neck:  Normal range of motion. Neck supple. No tracheal deviation present. No thyromegaly present.  Resp: No respiratory distress, normal effort. Breast:  Left breast surgically absent.  While arm at rest, there is thickening at the upper outer flap.  ? 2x3 cm mass.  I doubt this is a discrete area of thickening, though, because it seems to flatten out with her arm at abduction.  Right breast dense, no palpable masses or skin dimpling.  No nipple retraction.  No axillary, supra or infraclavicular adenopathy.   Abd:  Abdomen is soft, non distended and non tender. No masses are palpable.  There is no rebound and no guarding.  Neurological: Alert and oriented to person, place, and time. Coordination normal.  Skin: Skin is warm and dry. No rash noted. No diaphoretic. No erythema. No pallor.  Psychiatric: Normal mood and affect. Normal behavior. Judgment and thought content normal.      ASSESSMENT AND PLAN:   Malignant neoplasm of lower-inner quadrant of female breast, Left Invasive mammary  carcinoma, ER/PR+, Her2neu- Pt does not have discrete mass, but has thickening at lateral upper left breast flap.  Will get ultrasound.  Pt did have significantly aggressive cancer with multiple LN.    If something is there, we will pursue biopsy.           Milus Height, MD Surgical Oncology, [facility]  Eliezer Lofts, MD Jinny Sanders, MD

## 2014-02-18 NOTE — Assessment & Plan Note (Signed)
Pt does not have discrete mass, but has thickening at lateral upper left breast flap.  Will get ultrasound.  Pt did have significantly aggressive cancer with multiple LN.    If something is there, we will pursue biopsy.

## 2014-02-21 ENCOUNTER — Other Ambulatory Visit (INDEPENDENT_AMBULATORY_CARE_PROVIDER_SITE_OTHER): Payer: Self-pay | Admitting: General Surgery

## 2014-02-21 ENCOUNTER — Ambulatory Visit
Admission: RE | Admit: 2014-02-21 | Discharge: 2014-02-21 | Disposition: A | Discharge status: Home / Self Care | Payer: Medicare Other | Source: Ambulatory Visit | Attending: General Surgery | Admitting: General Surgery

## 2014-02-21 DIAGNOSIS — N63 Unspecified lump in unspecified breast: Secondary | ICD-10-CM

## 2014-02-21 DIAGNOSIS — Z853 Personal history of malignant neoplasm of breast: Secondary | ICD-10-CM

## 2014-02-21 DIAGNOSIS — N6489 Other specified disorders of breast: Secondary | ICD-10-CM | POA: Diagnosis not present

## 2014-02-28 ENCOUNTER — Ambulatory Visit (HOSPITAL_COMMUNITY)
Admission: RE | Admit: 2014-02-28 | Discharge: 2014-02-28 | Disposition: A | Discharge status: Home / Self Care | Payer: Medicare Other | Source: Ambulatory Visit | Attending: General Surgery | Admitting: General Surgery

## 2014-02-28 ENCOUNTER — Ambulatory Visit: Payer: Self-pay | Admitting: Internal Medicine

## 2014-02-28 DIAGNOSIS — Y842 Radiological procedure and radiotherapy as the cause of abnormal reaction of the patient, or of later complication, without mention of misadventure at the time of the procedure: Secondary | ICD-10-CM | POA: Insufficient documentation

## 2014-02-28 DIAGNOSIS — R911 Solitary pulmonary nodule: Secondary | ICD-10-CM | POA: Insufficient documentation

## 2014-02-28 DIAGNOSIS — T66XXXS Radiation sickness, unspecified, sequela: Secondary | ICD-10-CM | POA: Diagnosis not present

## 2014-02-28 DIAGNOSIS — C50919 Malignant neoplasm of unspecified site of unspecified female breast: Secondary | ICD-10-CM | POA: Diagnosis not present

## 2014-02-28 DIAGNOSIS — E0789 Other specified disorders of thyroid: Secondary | ICD-10-CM | POA: Diagnosis not present

## 2014-02-28 DIAGNOSIS — M25819 Other specified joint disorders, unspecified shoulder: Secondary | ICD-10-CM | POA: Diagnosis not present

## 2014-02-28 DIAGNOSIS — J701 Chronic and other pulmonary manifestations due to radiation: Secondary | ICD-10-CM | POA: Insufficient documentation

## 2014-02-28 DIAGNOSIS — Z853 Personal history of malignant neoplasm of breast: Secondary | ICD-10-CM

## 2014-02-28 LAB — GLUCOSE, CAPILLARY: GLUCOSE-CAPILLARY: 107 mg/dL — AB (ref 70–99)

## 2014-02-28 MED ORDER — FLUDEOXYGLUCOSE F - 18 (FDG) INJECTION
8.7000 | Freq: Once | INTRAVENOUS | Status: AC | PRN
Start: 2014-02-28 — End: 2014-02-28
  Administered 2014-02-28: 8.7 via INTRAVENOUS

## 2014-03-01 ENCOUNTER — Telehealth (INDEPENDENT_AMBULATORY_CARE_PROVIDER_SITE_OTHER): Payer: Self-pay | Admitting: General Surgery

## 2014-03-01 NOTE — Telephone Encounter (Signed)
Discussed PET scan with patient. Reviewed lack of findings in chest wall and axilla.  Discussed stable lung nodule with patient.

## 2014-03-10 ENCOUNTER — Other Ambulatory Visit: Payer: Self-pay | Admitting: Internal Medicine

## 2014-03-16 ENCOUNTER — Other Ambulatory Visit: Payer: Self-pay | Admitting: Plastic Surgery

## 2014-03-16 DIAGNOSIS — C50919 Malignant neoplasm of unspecified site of unspecified female breast: Secondary | ICD-10-CM | POA: Diagnosis not present

## 2014-03-21 ENCOUNTER — Encounter (HOSPITAL_COMMUNITY): Payer: Self-pay

## 2014-03-21 ENCOUNTER — Encounter (HOSPITAL_COMMUNITY)
Admission: RE | Admit: 2014-03-21 | Discharge: 2014-03-21 | Disposition: A | Discharge status: Home / Self Care | Payer: Medicare Other | Source: Ambulatory Visit | Attending: Plastic Surgery | Admitting: Plastic Surgery

## 2014-03-21 ENCOUNTER — Other Ambulatory Visit (HOSPITAL_COMMUNITY): Payer: Self-pay

## 2014-03-21 DIAGNOSIS — Z01812 Encounter for preprocedural laboratory examination: Secondary | ICD-10-CM | POA: Insufficient documentation

## 2014-03-21 DIAGNOSIS — Z01818 Encounter for other preprocedural examination: Secondary | ICD-10-CM | POA: Insufficient documentation

## 2014-03-21 LAB — BASIC METABOLIC PANEL
Anion gap: 12 (ref 5–15)
BUN: 15 mg/dL (ref 6–23)
CO2: 27 mEq/L (ref 19–32)
Calcium: 9 mg/dL (ref 8.4–10.5)
Chloride: 102 mEq/L (ref 96–112)
Creatinine, Ser: 0.82 mg/dL (ref 0.50–1.10)
GFR calc Af Amer: 85 mL/min — ABNORMAL LOW (ref >90)
GFR calc non Af Amer: 73 mL/min — ABNORMAL LOW (ref >90)
Glucose, Bld: 89 mg/dL (ref 70–99)
Potassium: 4.3 mEq/L (ref 3.7–5.3)
SODIUM: 141 meq/L (ref 137–147)

## 2014-03-21 LAB — CBC
HCT: 38 % (ref 36.0–46.0)
Hemoglobin: 12.1 g/dL (ref 12.0–15.0)
MCH: 29.2 pg (ref 26.0–34.0)
MCHC: 31.8 g/dL (ref 30.0–36.0)
MCV: 91.6 fL (ref 78.0–100.0)
PLATELETS: 220 10*3/uL (ref 150–400)
RBC: 4.15 MIL/uL (ref 3.87–5.11)
RDW: 13.4 % (ref 11.5–15.5)
WBC: 6.7 10*3/uL (ref 4.0–10.5)

## 2014-03-21 NOTE — Pre-Procedure Instructions (Addendum)
Cynthia Griffin  03/21/2014   Your procedure is scheduled on: Thursday, March 29, 2014  Report to [facility] Admitting at 7:30 AM.  Call this number if you have problems the morning of surgery: [phone]   Remember:   Do not eat food or drink liquids after midnight Wednesday, March 28, 2014   Take these medicines the morning of surgery with A SIP OF WATER: citalopram (CELEXA),  tamoxifen (NOLVADEX)         Take all meds as ordered until day of surgery except as instructed below or per  Dr  Stop taking Aspirin, vitamins, and herbal medications (Glucosamine, Biotin ). Do not take any NSAIDs ie: Ibuprofen, Advil, Naproxen or any medication containing Aspirin; stop 5 days prior to procedure ( Sat. 03/24/14).   Do not wear jewelry, make-up or nail polish.  Do not wear lotions, powders, or perfumes. You may NOT wear deodorant.  Do not shave 48 hours prior to surgery.  Do not bring valuables to the hospital.  [facility] is not responsible for any belongings or valuables.               Contacts, dentures or bridgework may not be worn into surgery.  Leave suitcase in the car. After surgery it may be brought to your room.  For patients admitted to the hospital, discharge time is determined by your treatment team.               Patients discharged the day of surgery will not be allowed to drive home.  Name and phone number of your driver:   Special Instructions: Special Instructions: [place] - Preparing for Surgery  Before surgery, you can play an important role.  Because skin is not sterile, your skin needs to be as free of germs as possible.  You can reduce the number of germs on you skin by washing with CHG (chlorahexidine gluconate) soap before surgery.  CHG is an antiseptic cleaner which kills germs and bonds with the skin to continue killing germs even after washing.  Please DO NOT use if you have an allergy to CHG or antibacterial soaps.  If your skin becomes  reddened/irritated stop using the CHG and inform your nurse when you arrive at Short Stay.  Do not shave (including legs and underarms) for at least 48 hours prior to the first CHG shower.  You may shave your face.  Please follow these instructions carefully:   1.  Shower with CHG Soap the night before surgery and the morning of Surgery.  2.  If you choose to wash your hair, wash your hair first as usual with your normal shampoo.  3.  After you shampoo, rinse your hair and body thoroughly to remove the Shampoo.  4.  Use CHG as you would any other liquid soap.  You can apply chg directly  to the skin and wash gently with scrungie or a clean washcloth.  5.  Apply the CHG Soap to your body ONLY FROM THE NECK DOWN.  Do not use on open wounds or open sores.  Avoid contact with your eyes ears, mouth and genitals (private parts).  Wash genitals (private parts)       with your normal soap.  6.  Wash thoroughly, paying special attention to the area where your surgery will be performed.  7.  Thoroughly rinse your body with warm water from the neck down.  8.  DO NOT shower/wash with your normal soap after  using and rinsing off the CHG Soap.  9.  Pat yourself dry with a clean towel.            10.  Wear clean pajamas.            11.  Place clean sheets on your bed the night of your first shower and do not sleep with pets.  Day of Surgery  Do not apply any lotions/deodorants the morning of surgery.  Please wear clean clothes to the hospital/surgery center.   Please read over the following fact sheets that you were given: Pain Booklet, Coughing and Deep Breathing and Surgical Site Infection Prevention

## 2014-03-28 MED ORDER — CEFAZOLIN SODIUM-DEXTROSE 2-3 GM-% IV SOLR
2.0000 g | INTRAVENOUS | Status: AC
  Administered 2014-03-29 (×2): 2 g via INTRAVENOUS
  Filled 2014-03-28: qty 50

## 2014-03-28 MED ORDER — HEPARIN SODIUM (PORCINE) 5000 UNIT/ML IJ SOLN
5000.0000 [IU] | Freq: Once | INTRAMUSCULAR | Status: AC
  Administered 2014-03-29: 5000 [IU] via SUBCUTANEOUS
  Filled 2014-03-28: qty 1

## 2014-03-28 NOTE — Progress Notes (Signed)
Patient called to arrive at 530

## 2014-03-29 ENCOUNTER — Encounter (HOSPITAL_COMMUNITY)
Admission: RE | Disposition: A | Discharge status: Home / Self Care | Payer: Self-pay | Source: Ambulatory Visit | Attending: Plastic Surgery

## 2014-03-29 ENCOUNTER — Encounter (HOSPITAL_COMMUNITY): Payer: Medicare Other | Admitting: Anesthesiology

## 2014-03-29 ENCOUNTER — Encounter (HOSPITAL_COMMUNITY): Payer: Self-pay | Admitting: *Deleted

## 2014-03-29 ENCOUNTER — Inpatient Hospital Stay (HOSPITAL_COMMUNITY): Payer: Medicare Other | Admitting: Anesthesiology

## 2014-03-29 ENCOUNTER — Inpatient Hospital Stay (HOSPITAL_COMMUNITY)
Admission: RE | Admit: 2014-03-29 | Discharge: 2014-03-31 | DRG: 578 | Disposition: A | Discharge status: Home / Self Care | Payer: Medicare Other | Source: Ambulatory Visit | Attending: Plastic Surgery | Admitting: Plastic Surgery

## 2014-03-29 ENCOUNTER — Encounter (HOSPITAL_COMMUNITY): Admission: RE | Payer: Self-pay | Source: Ambulatory Visit

## 2014-03-29 ENCOUNTER — Inpatient Hospital Stay (HOSPITAL_COMMUNITY): Admission: RE | Admit: 2014-03-29 | Payer: Medicare Other | Source: Ambulatory Visit | Admitting: Plastic Surgery

## 2014-03-29 DIAGNOSIS — Z923 Personal history of irradiation: Secondary | ICD-10-CM | POA: Diagnosis not present

## 2014-03-29 DIAGNOSIS — C50919 Malignant neoplasm of unspecified site of unspecified female breast: Secondary | ICD-10-CM | POA: Diagnosis present

## 2014-03-29 DIAGNOSIS — D649 Anemia, unspecified: Secondary | ICD-10-CM | POA: Diagnosis not present

## 2014-03-29 DIAGNOSIS — Z7901 Long term (current) use of anticoagulants: Secondary | ICD-10-CM | POA: Diagnosis not present

## 2014-03-29 DIAGNOSIS — G8918 Other acute postprocedural pain: Secondary | ICD-10-CM | POA: Diagnosis not present

## 2014-03-29 DIAGNOSIS — Z421 Encounter for breast reconstruction following mastectomy: Principal | ICD-10-CM

## 2014-03-29 DIAGNOSIS — M1990 Unspecified osteoarthritis, unspecified site: Secondary | ICD-10-CM | POA: Diagnosis not present

## 2014-03-29 DIAGNOSIS — Z79899 Other long term (current) drug therapy: Secondary | ICD-10-CM | POA: Diagnosis not present

## 2014-03-29 DIAGNOSIS — D235 Other benign neoplasm of skin of trunk: Secondary | ICD-10-CM | POA: Diagnosis not present

## 2014-03-29 DIAGNOSIS — Z9010 Acquired absence of unspecified breast and nipple: Secondary | ICD-10-CM

## 2014-03-29 HISTORY — PX: LATISSIMUS FLAP TO BREAST: SHX5357

## 2014-03-29 SURGERY — RECONSTRUCTION, BREAST, USING LATISSIMUS DORSI MYOCUTANEOUS FLAP
Anesthesia: General | Site: Breast | Laterality: Left

## 2014-03-29 MED ORDER — ROCURONIUM BROMIDE 100 MG/10ML IV SOLN
INTRAVENOUS | Status: DC | PRN
  Administered 2014-03-29 (×5): 10 mg via INTRAVENOUS
  Administered 2014-03-29: 30 mg via INTRAVENOUS

## 2014-03-29 MED ORDER — DEXAMETHASONE SODIUM PHOSPHATE 4 MG/ML IJ SOLN
INTRAMUSCULAR | Status: DC | PRN
Start: 2014-03-29 — End: 2014-03-29
  Administered 2014-03-29: 8 mg via INTRAVENOUS

## 2014-03-29 MED ORDER — LIDOCAINE HCL 4 % MT SOLN
OROMUCOSAL | Status: DC | PRN
  Administered 2014-03-29: 4 mL via TOPICAL

## 2014-03-29 MED ORDER — DIPHENHYDRAMINE HCL 50 MG/ML IJ SOLN: 12.5000 mg | Freq: Four times a day (QID) | INTRAMUSCULAR | Status: DC | PRN

## 2014-03-29 MED ORDER — DOCUSATE SODIUM 100 MG PO CAPS
100.0000 mg | ORAL_CAPSULE | Freq: Every day | ORAL | Status: DC
Start: 2014-03-29 — End: 2014-03-31
  Administered 2014-03-29 – 2014-03-31 (×3): 100 mg via ORAL
  Filled 2014-03-29 (×3): qty 1

## 2014-03-29 MED ORDER — NEOSTIGMINE METHYLSULFATE 10 MG/10ML IV SOLN
INTRAVENOUS | Status: DC | PRN
  Administered 2014-03-29: 3 mg via INTRAVENOUS

## 2014-03-29 MED ORDER — SODIUM CHLORIDE 0.9 % IV SOLN
INTRAVENOUS | Status: AC
  Administered 2014-03-29: 08:00:00
  Filled 2014-03-29: qty 1

## 2014-03-29 MED ORDER — ONDANSETRON HCL 4 MG/2ML IJ SOLN: 4.0000 mg | Freq: Once | INTRAMUSCULAR | Status: DC | PRN

## 2014-03-29 MED ORDER — ROCURONIUM BROMIDE 50 MG/5ML IV SOLN
INTRAVENOUS | Status: AC
  Filled 2014-03-29: qty 1

## 2014-03-29 MED ORDER — LIDOCAINE HCL (CARDIAC) 20 MG/ML IV SOLN
INTRAVENOUS | Status: AC
  Filled 2014-03-29: qty 5

## 2014-03-29 MED ORDER — FENTANYL CITRATE 0.05 MG/ML IJ SOLN
INTRAMUSCULAR | Status: AC
  Filled 2014-03-29: qty 5

## 2014-03-29 MED ORDER — PROPOFOL 10 MG/ML IV BOLUS
INTRAVENOUS | Status: DC | PRN
  Administered 2014-03-29: 200 mg via INTRAVENOUS

## 2014-03-29 MED ORDER — CITALOPRAM HYDROBROMIDE 40 MG PO TABS
40.0000 mg | ORAL_TABLET | Freq: Every day | ORAL | Status: DC
  Administered 2014-03-29 – 2014-03-31 (×3): 40 mg via ORAL
  Filled 2014-03-29: qty 1
  Filled 2014-03-29: qty 2
  Filled 2014-03-29 (×2): qty 1

## 2014-03-29 MED ORDER — GLYCOPYRROLATE 0.2 MG/ML IJ SOLN
INTRAMUSCULAR | Status: AC
  Filled 2014-03-29: qty 2

## 2014-03-29 MED ORDER — LIDOCAINE HCL (CARDIAC) 20 MG/ML IV SOLN
INTRAVENOUS | Status: DC | PRN
  Administered 2014-03-29: 60 mg via INTRAVENOUS

## 2014-03-29 MED ORDER — CHLORHEXIDINE GLUCONATE 4 % EX LIQD
1.0000 "application " | Freq: Once | CUTANEOUS | Status: DC
  Filled 2014-03-29: qty 15

## 2014-03-29 MED ORDER — HYDROMORPHONE 0.3 MG/ML IV SOLN
INTRAVENOUS | Status: AC
  Filled 2014-03-29: qty 25

## 2014-03-29 MED ORDER — MIDAZOLAM HCL 5 MG/5ML IJ SOLN
INTRAMUSCULAR | Status: DC | PRN
  Administered 2014-03-29: 2 mg via INTRAVENOUS

## 2014-03-29 MED ORDER — ONDANSETRON HCL 4 MG/2ML IJ SOLN
INTRAMUSCULAR | Status: AC
  Filled 2014-03-29: qty 2

## 2014-03-29 MED ORDER — HYDROMORPHONE HCL PF 1 MG/ML IJ SOLN: 0.2500 mg | INTRAMUSCULAR | Status: DC | PRN

## 2014-03-29 MED ORDER — HYDROMORPHONE 0.3 MG/ML IV SOLN
INTRAVENOUS | Status: DC
  Administered 2014-03-29: 14:00:00 via INTRAVENOUS
  Administered 2014-03-30: 0.999 mg via INTRAVENOUS
  Administered 2014-03-30: 0.799 mg via INTRAVENOUS

## 2014-03-29 MED ORDER — MIDAZOLAM HCL 2 MG/2ML IJ SOLN
INTRAMUSCULAR | Status: AC
  Filled 2014-03-29: qty 2

## 2014-03-29 MED ORDER — TAMOXIFEN CITRATE 10 MG PO TABS
10.0000 mg | ORAL_TABLET | Freq: Two times a day (BID) | ORAL | Status: DC
  Administered 2014-03-29 – 2014-03-31 (×5): 10 mg via ORAL
  Filled 2014-03-29 (×7): qty 1

## 2014-03-29 MED ORDER — INDOCYANINE GREEN 25 MG IV SOLR
INTRAVENOUS | Status: DC | PRN
  Administered 2014-03-29: 7.5 mg via INTRAVENOUS

## 2014-03-29 MED ORDER — 0.9 % SODIUM CHLORIDE (POUR BTL) OPTIME
TOPICAL | Status: DC | PRN
  Administered 2014-03-29 (×2): 1000 mL

## 2014-03-29 MED ORDER — NEOSTIGMINE METHYLSULFATE 10 MG/10ML IV SOLN
INTRAVENOUS | Status: AC
  Filled 2014-03-29: qty 1

## 2014-03-29 MED ORDER — ONDANSETRON HCL 4 MG/2ML IJ SOLN
INTRAMUSCULAR | Status: DC | PRN
  Administered 2014-03-29: 4 mg via INTRAVENOUS

## 2014-03-29 MED ORDER — ONDANSETRON HCL 4 MG/2ML IJ SOLN: 4.0000 mg | Freq: Four times a day (QID) | INTRAMUSCULAR | Status: DC | PRN

## 2014-03-29 MED ORDER — LACTATED RINGERS IV SOLN
INTRAVENOUS | Status: DC | PRN
  Administered 2014-03-29 (×3): via INTRAVENOUS

## 2014-03-29 MED ORDER — PROMETHAZINE HCL 25 MG/ML IJ SOLN: 6.2500 mg | INTRAMUSCULAR | Status: DC | PRN

## 2014-03-29 MED ORDER — PROPOFOL 10 MG/ML IV BOLUS
INTRAVENOUS | Status: AC
  Filled 2014-03-29: qty 20

## 2014-03-29 MED ORDER — METHOCARBAMOL 500 MG PO TABS: 500.0000 mg | ORAL_TABLET | Freq: Four times a day (QID) | ORAL | Status: DC | PRN

## 2014-03-29 MED ORDER — SODIUM CHLORIDE 0.9 % IJ SOLN: 9.0000 mL | INTRAMUSCULAR | Status: DC | PRN

## 2014-03-29 MED ORDER — GLYCOPYRROLATE 0.2 MG/ML IJ SOLN
INTRAMUSCULAR | Status: DC | PRN
  Administered 2014-03-29: .5 mg via INTRAVENOUS

## 2014-03-29 MED ORDER — FENTANYL CITRATE 0.05 MG/ML IJ SOLN
INTRAMUSCULAR | Status: DC | PRN
  Administered 2014-03-29: 100 ug via INTRAVENOUS
  Administered 2014-03-29 (×6): 50 ug via INTRAVENOUS
  Administered 2014-03-29: 100 ug via INTRAVENOUS
  Administered 2014-03-29: 50 ug via INTRAVENOUS

## 2014-03-29 MED ORDER — DIPHENHYDRAMINE HCL 12.5 MG/5ML PO ELIX: 12.5000 mg | ORAL_SOLUTION | Freq: Four times a day (QID) | ORAL | Status: DC | PRN

## 2014-03-29 MED ORDER — NALOXONE HCL 0.4 MG/ML IJ SOLN: 0.4000 mg | INTRAMUSCULAR | Status: DC | PRN

## 2014-03-29 MED ORDER — DEXTROSE-NACL 5-0.45 % IV SOLN
INTRAVENOUS | Status: DC
  Administered 2014-03-29 – 2014-03-30 (×2): via INTRAVENOUS

## 2014-03-29 SURGICAL SUPPLY — 78 items
APPLIER CLIP 9.375 MED OPEN (MISCELLANEOUS) ×3
ATCH SMKEVC FLXB CAUT HNDSWH (FILTER) ×1 IMPLANT
BAG DECANTER FOR FLEXI CONT (MISCELLANEOUS) ×3 IMPLANT
BIOPATCH RED 1 DISK 7.0 (GAUZE/BANDAGES/DRESSINGS) IMPLANT
BIOPATCH RED 1IN DISK 7.0MM (GAUZE/BANDAGES/DRESSINGS)
BLADE 10 SAFETY STRL DISP (BLADE) IMPLANT
BLADE SURG 10 STRL SS (BLADE) ×3 IMPLANT
BLADE SURG 15 STRL LF DISP TIS (BLADE) ×1 IMPLANT
BLADE SURG 15 STRL SS (BLADE) ×2
CANISTER SUCTION 2500CC (MISCELLANEOUS) ×3 IMPLANT
CHLORAPREP W/TINT 26ML (MISCELLANEOUS) ×6 IMPLANT
CLIP APPLIE 9.375 MED OPEN (MISCELLANEOUS) ×1 IMPLANT
CLOSURE WOUND 1/2 X4 (GAUZE/BANDAGES/DRESSINGS)
CONT SPEC 4OZ CLIKSEAL STRL BL (MISCELLANEOUS) ×3 IMPLANT
COVER SURGICAL LIGHT HANDLE (MISCELLANEOUS) ×3 IMPLANT
DRAIN CHANNEL 19F RND (DRAIN) ×12 IMPLANT
DRAPE INCISE 23X17 IOBAN STRL (DRAPES)
DRAPE INCISE IOBAN 23X17 STRL (DRAPES) IMPLANT
DRAPE LAPAROSCOPIC ABDOMINAL (DRAPES) ×3 IMPLANT
DRAPE ORTHO SPLIT 77X108 STRL (DRAPES) ×8
DRAPE PROXIMA HALF (DRAPES) ×6 IMPLANT
DRAPE SURG 17X23 STRL (DRAPES) ×27 IMPLANT
DRAPE SURG ORHT 6 SPLT 77X108 (DRAPES) ×4 IMPLANT
DRAPE WARM FLUID 44X44 (DRAPE) ×3 IMPLANT
DRESSING TELFA 8X3 (GAUZE/BANDAGES/DRESSINGS) IMPLANT
DRSG PAD ABDOMINAL 8X10 ST (GAUZE/BANDAGES/DRESSINGS) ×6 IMPLANT
DRSG SORBAVIEW 3.5X5-5/16 MED (GAUZE/BANDAGES/DRESSINGS) ×3 IMPLANT
ELECT BLADE 6.5 EXT (BLADE) ×3 IMPLANT
ELECT CAUTERY BLADE 6.4 (BLADE) ×6 IMPLANT
ELECT REM PT RETURN 9FT ADLT (ELECTROSURGICAL) ×3
ELECTRODE REM PT RTRN 9FT ADLT (ELECTROSURGICAL) ×1 IMPLANT
EVACUATOR SILICONE 100CC (DRAIN) ×12 IMPLANT
EVACUATOR SMOKE ACCUVAC VALLEY (FILTER) ×2
GAUZE XEROFORM 5X9 LF (GAUZE/BANDAGES/DRESSINGS) IMPLANT
GLOVE BIO SURGEON STRL SZ7.5 (GLOVE) ×6 IMPLANT
GLOVE BIOGEL PI IND STRL 7.0 (GLOVE) ×2 IMPLANT
GLOVE BIOGEL PI IND STRL 7.5 (GLOVE) ×5 IMPLANT
GLOVE BIOGEL PI IND STRL 8 (GLOVE) ×3 IMPLANT
GLOVE BIOGEL PI INDICATOR 7.0 (GLOVE) ×4
GLOVE BIOGEL PI INDICATOR 7.5 (GLOVE) ×10
GLOVE BIOGEL PI INDICATOR 8 (GLOVE) ×6
GLOVE ECLIPSE 7.5 STRL STRAW (GLOVE) ×12 IMPLANT
GLOVE SURG SS PI 7.0 STRL IVOR (GLOVE) ×6 IMPLANT
GOWN STRL REUS W/ TWL LRG LVL3 (GOWN DISPOSABLE) ×3 IMPLANT
GOWN STRL REUS W/ TWL XL LVL3 (GOWN DISPOSABLE) ×3 IMPLANT
GOWN STRL REUS W/TWL LRG LVL3 (GOWN DISPOSABLE) ×6
GOWN STRL REUS W/TWL XL LVL3 (GOWN DISPOSABLE) ×6
KIT BASIN OR (CUSTOM PROCEDURE TRAY) ×3 IMPLANT
KIT DISPOSABLE SPY ELITE (KITS) ×3 IMPLANT
KIT ROOM TURNOVER OR (KITS) ×3 IMPLANT
MARKER SKIN DUAL TIP RULER LAB (MISCELLANEOUS) ×3 IMPLANT
NS IRRIG 1000ML POUR BTL (IV SOLUTION) ×6 IMPLANT
PACK GENERAL/GYN (CUSTOM PROCEDURE TRAY) ×3 IMPLANT
PAD ABD 8X10 STRL (GAUZE/BANDAGES/DRESSINGS) ×3 IMPLANT
PAD ARMBOARD 7.5X6 YLW CONV (MISCELLANEOUS) ×6 IMPLANT
PENCIL BUTTON HOLSTER BLD 10FT (ELECTRODE) IMPLANT
PREFILTER EVAC NS 1 1/3-3/8IN (MISCELLANEOUS) ×3 IMPLANT
SPONGE GAUZE 4X4 12PLY (GAUZE/BANDAGES/DRESSINGS) ×3 IMPLANT
SPONGE LAP 18X18 X RAY DECT (DISPOSABLE) ×6 IMPLANT
STAPLER VISISTAT 35W (STAPLE) ×3 IMPLANT
STRIP CLOSURE SKIN 1/2X4 (GAUZE/BANDAGES/DRESSINGS) IMPLANT
SUT MNCRL AB 3-0 PS2 18 (SUTURE) ×12 IMPLANT
SUT MON AB 2-0 CT1 36 (SUTURE) ×9 IMPLANT
SUT PDS AB 0 CT 36 (SUTURE) ×9 IMPLANT
SUT PDS AB 3-0 SH 27 (SUTURE) IMPLANT
SUT PROLENE 3 0 PS 1 (SUTURE) ×12 IMPLANT
SUT PROLENE 3 0 PS 2 (SUTURE) IMPLANT
SUT VIC AB 3-0 FS2 27 (SUTURE) IMPLANT
SUT VIC AB 3-0 SH 18 (SUTURE) ×3 IMPLANT
SUT VIC AB 3-0 SH 8-18 (SUTURE) IMPLANT
SYR 50ML SLIP (SYRINGE) IMPLANT
SYR BULB IRRIGATION 50ML (SYRINGE) ×6 IMPLANT
TAPE CLOTH SURG 4X10 WHT LF (GAUZE/BANDAGES/DRESSINGS) ×3 IMPLANT
TOWEL OR 17X24 6PK STRL BLUE (TOWEL DISPOSABLE) ×6 IMPLANT
TOWEL OR 17X26 10 PK STRL BLUE (TOWEL DISPOSABLE) ×3 IMPLANT
TRAY FOLEY CATH 14FRSI W/METER (CATHETERS) ×3 IMPLANT
TUBE CONNECTING 12'X1/4 (SUCTIONS) ×2
TUBE CONNECTING 12X1/4 (SUCTIONS) ×4 IMPLANT

## 2014-03-29 NOTE — Op Note (Signed)
NAME:  Cynthia Griffin, DEBES NO.:  [id number]  MEDICAL RECORD NO.:  [id number]  LOCATION:  [facility]                         FACILITY:  [facility]  PHYSICIAN:  Crissie Reese, M.D.     DATE OF BIRTH:  [birth year]  DATE OF PROCEDURE:  03/29/2014 DATE OF DISCHARGE:                              OPERATIVE REPORT   PREOPERATIVE DIAGNOSIS:  Left breast cancer.  POSTOPERATIVE DIAGNOSIS:  Left breast cancer.  PROCEDURE PERFORMED:  Left latissimus myocutaneous flap (failed).  SURGEON:  Crissie Reese, MD.  ANESTHESIA:  General.  ESTIMATED BLOOD LOSS:  50 mL.  DRAINS:  Three 19-French, 2 in the back, 1 in the left anterior chest.  CLINICAL NOTE:  This 68 year old woman has had a left mastectomy for breast cancer last year and then had multiple positive sentinel lymph nodes.  She subsequently had a left axillary dissection.  She then underwent radiation treatment as well.  She presents for breast reconstruction.  Options were discussed and her best option appeared to be latissimus flap with implant.  This was discussed with her in great detail and risks were discussed include but not limited to bleeding, infection, healing problems, scarring, loss of sensation, fluid accumulations, anesthesia complications, failure of device, capsular contracture, displacement of device, wrinkles and ripples, loss of tissue, loss of skin, loss of the flap and pneumothorax, DVT, PE, chronic pain, and limitations of range of motion of shoulder and arm as well as loss of strength in the left upper extremity.  She understood all this and wished to proceed.  DESCRIPTION OF PROCEDURE:  The patient was marked in the holding area for the latissimus flap and then taken to the operating room and placed supine.  After successful induction of general anesthesia, she was prepped with ChloraPrep, and after waiting full 3 minutes for drying, she was draped with sterile drapes.  The old mastectomy scar was  excised and removed as a specimen and the superior and inferior mastectomy flaps were elevated recreating the defect.  Meticulous hemostasis achieved with electrocautery.  Excellent hemostasis having been confirmed, an antibiotic soaked lap was then positioned, and the skin was closed with a skin stapler.  The patient was then placed in left lateral decubitus in order to access the left latissimus muscle.  She was positioned with a bean bag for stability and axillary roll.  The arm was placed in the arm holder with careful support of the elbow area.  She was again prepped with ChloraPrep and draped with sterile drapes including impervious Steri drapes.  The skin paddle was then incised and the beveling in outward direction, medial and lateral in order to ensure broader attachment at the level of chest wall, then present level of the skin paddle to preserve blood flow in the skin paddle.  The underlying latissimus was identified and then its medial and lateral, superior and inferior borders identified.  The flap was then mobilized from inferior to superior, and as it was mobilized, larger perforating vessels were taken down using either triple Ligaclips and divided or suture ligatures with 3-0 Vicryl suture ligatures.  As this process continued, it was noted that the flap did not have  a very good blood supply.  It appeared that when the dorsal lumbar perforators that were taken down that the blood supply definitely diminished markedly.  Careful inspection was made without any problem with the pedicle itself.  There, certainly, was no blood flow from the skin edges.  Nevertheless, the flap having been essentially mobilized, that point was passed through the subcutaneous tunnel to the anterior chest and the back area was irrigated thoroughly with saline and hemostasis with electrocautery.  Two 19-French drains positioned, brought through separate stab wounds inferomedially and secured with  3-0 Prolene sutures.  The closure was with 0 PDS interrupted inverted deep dermal sutures, 2-0 Monocryl interrupted inverted deep dermal sutures, running 3-0 Monocryl subcuticular suture and a few reinforcing 3-0 Prolene sutures.  The Dermabond was applied and Biopatch filled with dry sterile dressings around the drains, and the patient was then placed supine after placing a sterile dressing.  Once the patient was supine, she was prepped with Betadine after removing the sterile Steri-Drape that had been covering the left mastectomy incision during this portion of procedure.  The skin staples were removed.  The flap was inspected.  It did not appear to have a blood supply and the Spy Elite machine was used to confirm that there was no blood flow.  Again, the flap was inspected.  Care was taken to make sure that there was no torsion of the flap which was not.  There was indeed none and there was no Doppler signal even high in the axilla along the thoracodorsal region, and this appeared to be related to possibly some compromise of the thoracodorsal vasculature post radiation and scar tissue from axillary dissection.  Care was taken to make sure that the flap was debrided back to viable muscle which was actually required debridement quite high into the left axilla in order to access blood flow to the flap in which case the muscle edge did have some bright red bleeding with consistent viability at this very high stump. The space was then irrigated and hemostasis confirmed, and a 19-French drain positioned in the skin of the mastectomy incision.  It was closed with 3-0 Monocryl and inverted deep dermal sutures and running 3-0 Monocryl subcuticular suture.  Again, Dermabond dry sterile dressing applied and the drain dressed in similar fashion with a Biopatch and the sterile dressing.  She was transferred to the recovery room in stable having taught procedure well.     Crissie Reese,  M.D.     DB/MEDQ  D:  03/29/2014  T:  03/29/2014  Job:  [id number]

## 2014-03-29 NOTE — H&P (Signed)
I have re-examined and re-evaluated the patient and there are no changes. See office notes in paper chart for H&P.  Planned procedure: Left latissimus flap with saline implant for breast reconstruction.

## 2014-03-29 NOTE — Anesthesia Preprocedure Evaluation (Addendum)
Anesthesia Evaluation  Patient identified by MRN, date of birth, ID band Patient awake    Reviewed: Allergy & Precautions, H&P , NPO status , Patient's Chart, lab work & pertinent test results  Airway Mallampati: II TM Distance: >3 FB Neck ROM: Full    Dental  (+) Dental Advisory Given,    Pulmonary former smoker,          Cardiovascular     Neuro/Psych  Neuromuscular disease    GI/Hepatic   Endo/Other    Renal/GU      Musculoskeletal   Abdominal   Peds  Hematology  (+) anemia ,   Anesthesia Other Findings Breast CA  Reproductive/Obstetrics                          Anesthesia Physical Anesthesia Plan  ASA: II  Anesthesia Plan: General   Post-op Pain Management:    Induction: Intravenous  Airway Management Planned: Oral ETT  Additional Equipment:   Intra-op Plan:   Post-operative Plan: Extubation in OR  Informed Consent: I have reviewed the patients History and Physical, chart, labs and discussed the procedure including the risks, benefits and alternatives for the proposed anesthesia with the patient or authorized representative who has indicated his/her understanding and acceptance.     Plan Discussed with: CRNA, Anesthesiologist and Surgeon  Anesthesia Plan Comments:         Anesthesia Quick Evaluation

## 2014-03-29 NOTE — Brief Op Note (Signed)
03/29/2014  12:01 PM  PATIENT:  Cynthia Griffin  68 y.o. female  PRE-OPERATIVE DIAGNOSIS:  breast cancer   POST-OPERATIVE DIAGNOSIS:  breast cancer   PROCEDURE:  Procedure(s): LEFT LATISSIMUS FLAP (Left)  SURGEON:  Surgeon(s) and Role:    * Crissie Reese, MD - Primary    * Crissie Reese, MD - Primary  PHYSICIAN ASSISTANT:   ASSISTANTS: Judyann Munson, RNFA  ANESTHESIA:   general  EBL:  Total I/O In: 2000 [I.V.:2000] Out: 51 [Urine:390; Blood:30]  BLOOD ADMINISTERED:none  DRAINS: (3) Jackson-Pratt drain(s) with closed bulb suction in the left back (2) and left chest (1)   LOCAL MEDICATIONS USED:  NONE  SPECIMEN:  Source of Specimen:  left mastectomy scar  DISPOSITION OF SPECIMEN:  PATHOLOGY  COUNTS:  YES  TOURNIQUET:  * No tourniquets in log *  DICTATION: .Other Dictation: Dictation Number [id number]  PLAN OF CARE: Admit to inpatient   PATIENT DISPOSITION:  PACU - hemodynamically stable.   Delay start of Pharmacological VTE agent (>24hrs) due to surgical blood loss or risk of bleeding: no (she had heparin pre-op)

## 2014-03-29 NOTE — Transfer of Care (Signed)
Immediate Anesthesia Transfer of Care Note  Patient: Cynthia Griffin  Procedure(s) Performed: Procedure(s): LEFT LATISSIMUS FLAP (Left)  Patient Location: PACU  Anesthesia Type:General  Level of Consciousness: awake, alert , oriented and patient cooperative  Airway & Oxygen Therapy: Patient Spontanous Breathing and Patient connected to nasal cannula oxygen  Post-op Assessment: Report given to PACU RN, Post -op Vital signs reviewed and stable and Patient moving all extremities  Post vital signs: Reviewed and stable  Complications: No apparent anesthesia complications

## 2014-03-29 NOTE — Progress Notes (Signed)
Due to type of surgery (lattisimus flap), Dr. Harlow Mares requests that sacral dressing not be applied.

## 2014-03-30 ENCOUNTER — Encounter (HOSPITAL_COMMUNITY): Payer: Self-pay | Admitting: Plastic Surgery

## 2014-03-30 MED ORDER — HYDROMORPHONE HCL 2 MG PO TABS
2.0000 mg | ORAL_TABLET | ORAL | Status: DC | PRN
  Administered 2014-03-30 (×3): 2 mg via ORAL
  Administered 2014-03-31: 4 mg via ORAL
  Administered 2014-03-31: 2 mg via ORAL
  Filled 2014-03-30 (×3): qty 1
  Filled 2014-03-30: qty 2
  Filled 2014-03-30: qty 1

## 2014-03-30 MED ORDER — METHOCARBAMOL 500 MG PO TABS
500.0000 mg | ORAL_TABLET | Freq: Three times a day (TID) | ORAL | Status: DC
  Administered 2014-03-30 – 2014-03-31 (×5): 500 mg via ORAL
  Filled 2014-03-30 (×7): qty 1

## 2014-03-30 MED ORDER — HEPARIN SODIUM (PORCINE) 5000 UNIT/ML IJ SOLN
5000.0000 [IU] | Freq: Three times a day (TID) | INTRAMUSCULAR | Status: DC
  Administered 2014-03-30 – 2014-03-31 (×3): 5000 [IU] via SUBCUTANEOUS
  Filled 2014-03-30 (×6): qty 1

## 2014-03-30 MED ORDER — BIOTENE DRY MOUTH MT LIQD
15.0000 mL | Freq: Two times a day (BID) | OROMUCOSAL | Status: DC
Start: 2014-03-30 — End: 2014-03-31
  Administered 2014-03-30 – 2014-03-31 (×3): 15 mL via OROMUCOSAL

## 2014-03-30 NOTE — Progress Notes (Signed)
Subjective:  Seems to be in fairly good spirits. No nausea. Good pain control.  Objective: Vital signs in last 24 hours: Temp:  [97.2 F (36.2 C)-98.6 F (37 C)] 98.1 F (36.7 C) (07/24 0545) Pulse Rate:  [72-110] 76 (07/24 0545) Resp:  [14-22] 14 (07/24 0545) BP: (121-156)/(48-67) 121/50 mmHg (07/24 0545) SpO2:  [96 %-99 %] 96 % (07/24 0545)  Intake/Output from previous day: 07/23 0701 - 07/24 0700 In: 4696.7 [P.O.:1320; I.V.:3376.7] Out: 4566 [Urine:4290; Drains:246; Blood:30] Intake/Output this shift:    Operative sites: Mastectomy flaps viable. Drains functioning. Drainage thin. No sign of bleeding or infection left chest or back.  No results found for this basename: WBC, HGB, HCT, PLATELETS, NA, K, CL, CO2, BUN, CREATININE, GLU,  in the last 72 hours  Studies/Results: No results found.  Assessment/Plan: DVT prophylaxis continues. D/C PCA and begin PO pain med. Ambulate. D/C foley.   LOS: 1 day    Crissie Reese M 03/30/2014 8:18 AM

## 2014-03-30 NOTE — Anesthesia Postprocedure Evaluation (Signed)
  Anesthesia Post-op Note  Patient: Cynthia Griffin  Procedure(s) Performed: Procedure(s): LEFT LATISSIMUS FLAP (Left)  Patient Location: PACU  Anesthesia Type:General  Level of Consciousness: awake, oriented, sedated and patient cooperative  Airway and Oxygen Therapy: Patient Spontanous Breathing  Post-op Pain: moderate  Post-op Assessment: Post-op Vital signs reviewed, Patient's Cardiovascular Status Stable, Respiratory Function Stable, Patent Airway, No signs of Nausea or vomiting and Pain level controlled  Post-op Vital Signs: stable  Last Vitals:  Filed Vitals:   03/30/14 0545  BP: 121/50  Pulse: 76  Temp: 36.7 C  Resp: 14    Complications: No apparent anesthesia complications

## 2014-03-30 NOTE — Care Management Note (Signed)
  Page 1 of 1   03/30/2014     11:15:11 AM CARE MANAGEMENT NOTE 03/30/2014  Patient:  Cynthia Griffin, Cynthia Griffin   Account Number:  [id number]  Date Initiated:  03/30/2014  Documentation initiated by:  Magdalen Spatz  Subjective/Objective Assessment:     Action/Plan:   Anticipated DC Date:     Anticipated DC Plan:           Choice offered to / List presented to:             Status of service:   Medicare Important Message given?  YES (If response is "NO", the following Medicare IM given date fields will be blank) Date Medicare IM given:  03/30/2014 Medicare IM given by:  Magdalen Spatz Date Additional Medicare IM given:   Additional Medicare IM given by:    Discharge Disposition:    Per UR Regulation:    If discussed at Long Length of Stay Meetings, dates discussed:    Comments:

## 2014-03-31 MED ORDER — ENOXAPARIN SODIUM 40 MG/0.4ML ~~LOC~~ SOLN
40.0000 mg | SUBCUTANEOUS | Status: DC
Start: 2014-03-31 — End: 2014-03-31
  Administered 2014-03-31: 40 mg via SUBCUTANEOUS
  Filled 2014-03-31: qty 0.4

## 2014-03-31 MED ORDER — HYDROMORPHONE HCL 2 MG PO TABS: 2.0000 mg | ORAL_TABLET | ORAL | Status: DC | PRN

## 2014-03-31 MED ORDER — DSS 100 MG PO CAPS: 100.0000 mg | ORAL_CAPSULE | Freq: Every day | ORAL | Status: DC

## 2014-03-31 MED ORDER — METHOCARBAMOL 500 MG PO TABS: 500.0000 mg | ORAL_TABLET | Freq: Three times a day (TID) | ORAL | Status: DC

## 2014-03-31 MED ORDER — ENOXAPARIN SODIUM 40 MG/0.4ML ~~LOC~~ SOLN: 40.0000 mg | SUBCUTANEOUS | Status: DC

## 2014-03-31 MED ORDER — ACETAMINOPHEN 325 MG PO TABS: 650.0000 mg | ORAL_TABLET | Freq: Four times a day (QID) | ORAL | Status: DC | PRN

## 2014-03-31 NOTE — Progress Notes (Signed)
Pt ready to go home with husband.  Pt's husband will inject the Lovenox shots at home.  He is comfortable with this.  All discharge instructions reviewed and copy of instructions given.

## 2014-03-31 NOTE — Progress Notes (Signed)
Subjective: Good pain control. Has not ambulated much yet.  Objective: Vital signs in last 24 hours: Temp:  [98.4 F (36.9 C)-98.9 F (37.2 C)] 98.6 F (37 C) (07/25 0501) Pulse Rate:  [72-91] 80 (07/25 0501) Resp:  [16] 16 (07/25 0501) BP: (120-140)/(48-62) 140/62 mmHg (07/25 0501) SpO2:  [95 %-97 %] 97 % (07/25 0501)  Intake/Output from previous day: 07/24 0701 - 07/25 0700 In: 1580 [P.O.:480] Out: 1350 [Urine:1100; Drains:250] Intake/Output this shift: Total I/O In: 85 [P.O.:85] Out: 800 [Urine:800]  Operative sites: Mastectomy flaps viable. Drains functioning. Drainage thin. No evidence of bleeding or infection at either operative site.  No results found for this basename: WBC, HGB, HCT, PLATELETS, NA, K, CL, CO2, BUN, CREATININE, GLU,  in the last 72 hours  Studies/Results: No results found.  Assessment/Plan: Work on ambulation. Convert to Lovenox from heparin for DVT prophylaxis. Should be ready for discharge later today or in am depending on status of ambulation.   LOS: 2 days    Harlow Mares, Demorris Choyce M 03/31/2014 9:52 AM

## 2014-03-31 NOTE — Discharge Summary (Signed)
Physician Discharge Summary  Patient ID: Cynthia Griffin MRN: [id number] DOB/AGE: 67-Nov-1947 67 y.o.  Admit date: 03/29/2014 Discharge date: 03/31/2014  Admission Diagnoses:Breast cancer  Discharge Diagnoses: Same Active Problems:   Breast cancer   Discharged Condition: good  Hospital Course: On the day of admission the patient was taken to surgery and had left latissimus flap that revealed inadequate perfusion through the thoracodorsal vessel from the moment all dorsal lumbar perforators had been divided. The delayed breast reconstruction was aborted intraoperatively. The patient tolerated the procedures well. Postoperatively, she did well. She was tolerating diet first day and was able to ambulate better today. She would like to go home. She has continued on DVT prophylaxis post-op.  Treatments: antibiotics: Ancef, anticoagulation: heparin and surgery: left latissimus flap (inadequately perfused)  Discharge Exam: Blood pressure 134/59, pulse 69, temperature 98 F (36.7 C), temperature source Oral, resp. rate 15, height 5\' 4"  (1.626 m), weight 158 lb (71.668 kg), SpO2 100.00%.  Operative sites: Mastectomy flaps viaqble. Drains functioning. Drainage thin. There is no evidence of bleeding or infection either site.  Disposition: 01-Home or Self Care     Medication List    STOP taking these medications       Biotin 5000 MCG Caps     Glucosamine 750 MG Tabs     non-metallic deodorant Misc  Commonly known as:  ALRA      TAKE these medications       calcium citrate-vitamin D 315-200 MG-UNIT per tablet  Commonly known as:  CITRACAL+D  Take 1 tablet by mouth 2 (two) times daily.     citalopram 40 MG tablet  Commonly known as:  CELEXA  Take 40 mg by mouth daily.     DSS 100 MG Caps  Take 100 mg by mouth daily.     enoxaparin 40 MG/0.4ML injection  Commonly known as:  LOVENOX  Inject 0.4 mLs (40 mg total) into the skin daily.     HYDROmorphone 2 MG tablet  Commonly known  as:  DILAUDID  Take 1-2 tablets (2-4 mg total) by mouth every 4 (four) hours as needed for moderate pain.     methocarbamol 500 MG tablet  Commonly known as:  ROBAXIN  Take 1 tablet (500 mg total) by mouth 4 (four) times daily -  with meals and at bedtime.     multivitamin with minerals Tabs tablet  Take 1 tablet by mouth daily.     tamoxifen 20 MG tablet  Commonly known as:  NOLVADEX  TAKE 1 TABLET (20 MG TOTAL) BY MOUTH DAILY.         SignedHarlow Mares, Zo Loudon M 03/31/2014, 3:40 PM

## 2014-03-31 NOTE — Progress Notes (Signed)
Pt education done with pt and husband on how to administer Lovenox and JP drain care.

## 2014-03-31 NOTE — Discharge Instructions (Addendum)
No lifting for 6 weeks No vigorous activity for 6 weeks (including outdoor walks) No driving for 4 weeks OK to walk up stairs slowly Stay propped up Use incentive spirometer at home every hour while awake No shower while drains are in place Empty drains at least three times a day and record the amounts separately Change drain dressings every third day if instructed to do so by Dr. Harlow Mares  Apply Bacitracin antibiotic ointment to the drain sites  Place gauze dressing over drains  Secure the gauze with tape Take an over-the-counter Probiotic while on antibiotics Take an over-the-counter stool softener (such as Colace) while on pain medication See Dr. Harlow Mares in office towards the end of this next week. Continue lovenox at home (the injection that helps prevent blood clots) tomorrow afternoon or evening For questions call [phone] or [phone]

## 2014-04-17 ENCOUNTER — Other Ambulatory Visit: Payer: Self-pay | Admitting: Internal Medicine

## 2014-04-23 ENCOUNTER — Other Ambulatory Visit: Payer: Self-pay | Admitting: *Deleted

## 2014-04-23 DIAGNOSIS — C50919 Malignant neoplasm of unspecified site of unspecified female breast: Secondary | ICD-10-CM

## 2014-04-23 MED ORDER — TAMOXIFEN CITRATE 20 MG PO TABS: 20.0000 mg | ORAL_TABLET | Freq: Every day | ORAL | Status: DC

## 2014-05-01 ENCOUNTER — Other Ambulatory Visit: Payer: Self-pay | Admitting: *Deleted

## 2014-05-01 NOTE — Progress Notes (Unsigned)
This RN spoke with pt per her 3rd call concerning need to change appointment in September.  Per pt- she needs to reschedule until October due to being out of town.  Appointment will be rescheduled appropriately.

## 2014-05-03 ENCOUNTER — Telehealth: Payer: Self-pay | Admitting: Oncology

## 2014-05-03 NOTE — Telephone Encounter (Signed)
, °

## 2014-05-08 ENCOUNTER — Other Ambulatory Visit: Payer: Self-pay | Admitting: Internal Medicine

## 2014-05-09 ENCOUNTER — Encounter: Payer: Self-pay | Admitting: Internal Medicine

## 2014-05-18 ENCOUNTER — Other Ambulatory Visit: Payer: Self-pay | Admitting: Internal Medicine

## 2014-05-21 NOTE — Telephone Encounter (Signed)
Rx called in 

## 2014-05-24 ENCOUNTER — Other Ambulatory Visit: Payer: Medicare Other

## 2014-05-24 ENCOUNTER — Ambulatory Visit: Payer: Medicare Other | Admitting: Oncology

## 2014-05-24 ENCOUNTER — Telehealth: Payer: Self-pay | Admitting: *Deleted

## 2014-05-24 NOTE — Telephone Encounter (Signed)
Orders for F6812 and L8000 [facility]. Original sent to HIM to be scanned into patient's chart.

## 2014-06-05 ENCOUNTER — Encounter: Payer: Self-pay | Admitting: *Deleted

## 2014-06-19 ENCOUNTER — Other Ambulatory Visit: Payer: Self-pay | Admitting: *Deleted

## 2014-06-19 DIAGNOSIS — C50319 Malignant neoplasm of lower-inner quadrant of unspecified female breast: Secondary | ICD-10-CM

## 2014-06-20 ENCOUNTER — Other Ambulatory Visit (HOSPITAL_BASED_OUTPATIENT_CLINIC_OR_DEPARTMENT_OTHER): Payer: Medicare Other

## 2014-06-20 ENCOUNTER — Ambulatory Visit (HOSPITAL_BASED_OUTPATIENT_CLINIC_OR_DEPARTMENT_OTHER): Payer: Medicare Other | Admitting: Oncology

## 2014-06-20 ENCOUNTER — Ambulatory Visit (INDEPENDENT_AMBULATORY_CARE_PROVIDER_SITE_OTHER): Payer: Medicare Other | Admitting: Internal Medicine

## 2014-06-20 ENCOUNTER — Encounter: Payer: Self-pay | Admitting: Internal Medicine

## 2014-06-20 ENCOUNTER — Other Ambulatory Visit: Payer: Self-pay | Admitting: Internal Medicine

## 2014-06-20 VITALS — BP 139/65 | HR 70 | Temp 98.0°F | Resp 18 | Ht 64.0 in | Wt 159.2 lb

## 2014-06-20 VITALS — BP 134/66 | HR 77 | Temp 98.3°F | Ht 70.0 in | Wt 200.2 lb

## 2014-06-20 DIAGNOSIS — R918 Other nonspecific abnormal finding of lung field: Secondary | ICD-10-CM

## 2014-06-20 DIAGNOSIS — E1169 Type 2 diabetes mellitus with other specified complication: Secondary | ICD-10-CM

## 2014-06-20 DIAGNOSIS — I25110 Atherosclerotic heart disease of native coronary artery with unstable angina pectoris: Secondary | ICD-10-CM

## 2014-06-20 DIAGNOSIS — N63 Unspecified lump in unspecified breast: Secondary | ICD-10-CM

## 2014-06-20 DIAGNOSIS — IMO0002 Reserved for concepts with insufficient information to code with codable children: Secondary | ICD-10-CM

## 2014-06-20 DIAGNOSIS — I10 Essential (primary) hypertension: Secondary | ICD-10-CM

## 2014-06-20 DIAGNOSIS — I200 Unstable angina: Secondary | ICD-10-CM

## 2014-06-20 DIAGNOSIS — E785 Hyperlipidemia, unspecified: Secondary | ICD-10-CM

## 2014-06-20 DIAGNOSIS — E1165 Type 2 diabetes mellitus with hyperglycemia: Secondary | ICD-10-CM

## 2014-06-20 DIAGNOSIS — E119 Type 2 diabetes mellitus without complications: Secondary | ICD-10-CM

## 2014-06-20 DIAGNOSIS — C50912 Malignant neoplasm of unspecified site of left female breast: Secondary | ICD-10-CM

## 2014-06-20 DIAGNOSIS — M8580 Other specified disorders of bone density and structure, unspecified site: Secondary | ICD-10-CM | POA: Diagnosis not present

## 2014-06-20 DIAGNOSIS — C50312 Malignant neoplasm of lower-inner quadrant of left female breast: Secondary | ICD-10-CM

## 2014-06-20 DIAGNOSIS — I890 Lymphedema, not elsewhere classified: Secondary | ICD-10-CM

## 2014-06-20 DIAGNOSIS — Z170 Estrogen receptor positive status [ER+]: Secondary | ICD-10-CM | POA: Diagnosis not present

## 2014-06-20 DIAGNOSIS — C50319 Malignant neoplasm of lower-inner quadrant of unspecified female breast: Secondary | ICD-10-CM

## 2014-06-20 LAB — LIPID PANEL
Cholesterol: 99 mg/dL (ref 0–200)
HDL: 27 mg/dL — ABNORMAL LOW (ref >39)
LDL Cholesterol: 44 mg/dL (ref 0–99)
Total CHOL/HDL Ratio: 3.7 Ratio
Triglycerides: 139 mg/dL (ref <150)
VLDL: 28 mg/dL (ref 0–40)

## 2014-06-20 LAB — GLUCOSE, CAPILLARY: GLUCOSE-CAPILLARY: 209 mg/dL — AB (ref 70–99)

## 2014-06-20 LAB — POCT GLYCOSYLATED HEMOGLOBIN (HGB A1C): Hemoglobin A1C: 7.3

## 2014-06-20 LAB — COMPREHENSIVE METABOLIC PANEL (CC13)
ALBUMIN: 3.5 g/dL (ref 3.5–5.0)
ALK PHOS: 54 U/L (ref 40–150)
ALT: 21 U/L (ref 0–55)
AST: 18 U/L (ref 5–34)
Anion Gap: 7 mEq/L (ref 3–11)
BUN: 13.4 mg/dL (ref 7.0–26.0)
CO2: 28 mEq/L (ref 22–29)
Calcium: 9.2 mg/dL (ref 8.4–10.4)
Chloride: 109 mEq/L (ref 98–109)
Creatinine: 0.9 mg/dL (ref 0.6–1.1)
GLUCOSE: 114 mg/dL (ref 70–140)
Potassium: 4 mEq/L (ref 3.5–5.1)
Sodium: 144 mEq/L (ref 136–145)
Total Bilirubin: 0.36 mg/dL (ref 0.20–1.20)
Total Protein: 6.6 g/dL (ref 6.4–8.3)

## 2014-06-20 LAB — CBC WITH DIFFERENTIAL/PLATELET
BASO%: 0.2 % (ref 0.0–2.0)
BASOS ABS: 0 10*3/uL (ref 0.0–0.1)
EOS ABS: 0 10*3/uL (ref 0.0–0.5)
EOS%: 0.7 % (ref 0.0–7.0)
HCT: 38 % (ref 34.8–46.6)
HEMOGLOBIN: 12 g/dL (ref 11.6–15.9)
LYMPH%: 28 % (ref 14.0–49.7)
MCH: 28.2 pg (ref 25.1–34.0)
MCHC: 31.7 g/dL (ref 31.5–36.0)
MCV: 89 fL (ref 79.5–101.0)
MONO#: 0.5 10*3/uL (ref 0.1–0.9)
MONO%: 7.4 % (ref 0.0–14.0)
NEUT#: 4.3 10*3/uL (ref 1.5–6.5)
NEUT%: 63.7 % (ref 38.4–76.8)
PLATELETS: 255 10*3/uL (ref 145–400)
RBC: 4.27 10*6/uL (ref 3.70–5.45)
RDW: 13.7 % (ref 11.2–14.5)
WBC: 6.7 10*3/uL (ref 3.9–10.3)
lymph#: 1.9 10*3/uL (ref 0.9–3.3)

## 2014-06-20 MED ORDER — TRAMADOL HCL 50 MG PO TABS: 100.0000 mg | ORAL_TABLET | Freq: Three times a day (TID) | ORAL | Status: DC | PRN

## 2014-06-20 NOTE — Progress Notes (Signed)
Tramadol 50 mg rx called to [facility] on [address].

## 2014-06-20 NOTE — Telephone Encounter (Signed)
I just filled this in the clinic this morning, could you call it in if not done so by [facility]?  Thanks

## 2014-06-20 NOTE — Patient Instructions (Signed)
General Instructions: Please schedule a follow up visit within the next 3 months.   For your medications:   Please bring all of your pill  Bottles with you to each visit.  This will help make sure that we have an up to date list of all the medications you are taking.  Please also bring any over the counter herbal medications you are taking (not including advil, tylenol, etc.)  Please continue taking all of your medications as prescribed  Below is some advice about what to eat to help control your diabetes.  We will see you back in 3 months to see how you are doing!  Thank you!   Treatment Goals:  Goals (1 Years of Data) as of 06/20/14         As of Today 12/04/13 11/23/13 10/16/13 10/16/13     Blood Pressure    . Blood Pressure < 140/90  134/66 160/82 184/95 151/86 144/76     Result Component    . HEMOGLOBIN A1C < 7.0  7.3   6.4     . LDL CALC < 100            Progress Toward Treatment Goals:  Treatment Goal 06/20/2014  Hemoglobin A1C deteriorated  Blood pressure at goal    Self Care Goals & Plans:  Self Care Goal 06/20/2014  Manage my medications take my medicines as prescribed; bring my medications to every visit; refill my medications on time  Monitor my health keep track of my blood glucose; bring my glucose meter and log to each visit  Eat healthy foods eat baked foods instead of fried foods; eat foods that are low in salt; eat more vegetables  Be physically active find an activity I enjoy    Home Blood Glucose Monitoring 06/20/2014  Check my blood sugar 2 times a day  When to check my blood sugar -     Care Management & Community Referrals:  Referral 06/20/2014  Referrals made for care management support none needed    Diabetes, Eating Away From Home Sometimes, you might eat in a restaurant or have meals that are prepared by someone else. You can enjoy eating out. However, the portions in restaurants may be much larger than needed. Listed below are some ideas  to help you choose foods that will keep your blood glucose (sugar) in better control.  TIPS FOR EATING OUT  Know your meal plan and how many carbohydrate servings you should have at each meal. You may wish to carry a copy of your meal plan in your purse or wallet. Learn the foods included in each food group.  Make a list of restaurants near you that offer healthy choices. Take a copy of the carry-out menus to see what they offer. Then, you can plan what you will order ahead of time.  Become familiar with serving sizes by practicing them at home using measuring cups and spoons. Once you learn to recognize portion sizes, you will be able to correctly estimate the amount of total carbohydrate you are allowed to eat at the restaurant. Ask for a takeout box if the portion is more than you should have. When your food comes, leave the amount you should have on the plate, and put the rest in the takeout box before you start eating.  Plan ahead if your mealtime will be different from usual. Check with your caregiver to find out how to time meals and medicine if you are taking insulin.  Avoid  high-fat foods, such as fried foods, cream sauces, high-fat salad dressings, or any added butter or margarine.  Do not be afraid to ask questions. Ask your server about the portion size, cooking methods, ingredients and if items can be substituted. Restaurants do not list all available items on the menu. You can ask for your main entree to be prepared using skim milk, oil instead of butter or margarine, and without gravy or sauces. Ask your waiter or waitress to serve salad dressings, gravy, sauces, margarine, and sour cream on the side. You can then add the amount your meal plan suggests.  Add more vegetables whenever possible.  Avoid items that are labeled "jumbo," "giant," "deluxe," or "supersized."  You may want to split an entre with someone and order an extra side salad.  Watch for hidden calories in foods  like croutons, bacon, or cheese.  Ask your server to take away the bread basket or chips from your table.  Order a dinner salad as an appetizer. You can eat most foods served in a restaurant. Some foods are better choices than others. Breads and Starches  Recommended: All kinds of bread (wheat, rye, white, oatmeal, [place], [place], raisin), hard or soft dinner rolls, frankfurter or hamburger buns, small bagels, small corn or whole-wheat flour tortillas.  Avoid: Frosted or glazed breads, butter rolls, egg or cheese breads, croissants, sweet rolls, pastries, coffee cake, glazed or frosted doughnuts, muffins. Crackers  Recommended: Animal crackers, graham, rye, saltine, oyster, and matzoth crackers. Bread sticks, melba toast, rusks, pretzels, popcorn (without fat), zwieback toast.  Avoid: High-fat snack crackers or chips. Buttered popcorn. Cereals  Recommended: Hot and cold cereals. Whole grains such as oatmeal or shredded wheat are good choices.  Avoid: Sugar-coated or granola type cereals. Potatoes/Pasta/Rice/Beans  Recommended: Order baked, boiled, or mashed potatoes, rice or noodles without added fat, whole beans. Order gravies, butter, margarine, or sauces on the side so you can control the amount you add.  Avoid: Hash browns or fried potatoes. Potatoes, pasta, or rice prepared with cream or cheese sauce. Potato or pasta salads prepared with large amounts of dressing. Fried beans or fried rice. Vegetables  Recommended: Order steamed, baked, boiled, or stewed vegetables without sauces or extra fat. Ask that sauce be served on the side. If vegetables are not listed on the menu, ask what is available.  Avoid: Vegetables prepared with cream, butter, or cheese sauce. Fried vegetables. Salad Bars  Recommended: Many of the vegetables at a salad bar are considered "free." Use lemon juice, vinegar, or low-calorie salad dressing (fewer than 20 calories per serving) as "free" dressings for  your salad. Look for salad bar ingredients that have no added fat or sugar such as tomatoes, lettuce, cucumbers, broccoli, carrots, onions, and mushrooms.  Avoid: Prepared salads with large amounts of dressing, such as coleslaw, caesar salad, macaroni salad, bean salad, or carrot salad. Fruit  Recommended: Eat fresh fruit or fresh fruit salad without added dressing. A salad bar often offers fresh fruit choices, but canned fruit at a restaurant is usually packed in sugar or syrup.  Avoid: Sweetened canned or frozen fruits, plain or sweetened fruit juice. Fruit salads with dressing, sour cream, or sugar added to them. Meat and Meat Substitutes  Recommended: Order broiled, baked, roasted, or grilled meat, poultry, or fish. Trim off all visible fat. Do not eat the skin of poultry. The size stated on the menu is the raw weight. Meat shrinks by  in cooking (for example, 4 oz raw equals  3 oz cooked meat).  Avoid: Deep-fat fried meat, poultry, or fish. Breaded meats. Eggs  Recommended: Order soft, hard-cooked, poached, or scrambled eggs. Omelets may be okay, depending on what ingredients are added. Egg substitutes are also a good choice.  Avoid: Fried eggs, eggs prepared with cream or cheese sauce. Milk  Recommended: Order low-fat or fat-free milk according to your meal plan. Plain, nonfat yogurt or flavored yogurt with no sugar added may be used as a substitute for milk. Soy milk may also be used.  Avoid: Milk shakes or sweetened milk beverages. Soups and Combination Foods  Recommended: Clear broth or consomm are "free" foods and may be used as an appetizer. Broth-based soups with fat removed count as a starch serving and are preferred over cream soups. Soups made with beans or split peas may be eaten but count as a starch.  Avoid: Fatty soups, soup made with cream, cheese soup. Combination foods prepared with excessive amounts of fat or with cream or cheese sauces. Desserts and  Sweets  Recommended: Ask for fresh fruit. Sponge or angel food cake without icing, ice milk, no sugar added ice cream, sherbet, or frozen yogurt may fit into your meal plan occasionally.  Avoid: Pastries, puddings, pies, cakes with icing, custard, gelatin desserts. Fats and Oils  Recommended: Choose healthy fats such as olive oil, canola oil, or tub margarine, reduced fat or fat-free sour cream, cream cheese, avocado, or nuts.  Avoid: Any fats in excess of your allowed portion. Deep-fried foods or any food with a large amount of fat. Note: Ask for all fats to be served on the side, and limit your portion sizes according to your meal plan. Document Released: 08/24/2005 Document Revised: 11/16/2011 Document Reviewed: 11/21/2013 [facility] Patient Information 2015 [place], [place]. This information is not intended to replace advice given to you by your health care provider. Make sure you discuss any questions you have with your health care provider.

## 2014-06-20 NOTE — Progress Notes (Signed)
   Subjective:    Patient ID: [name], female    DOB: May 04, 1946, 68 y.o.   MRN: [id number]  CC: Routine follow up   HPI  [name] is a 68yo woman with PMH of CAD, DM2, GERD, HLD and HTN who presents for routine follow up.    For her CAD, she reports that she has chronic stable angina.  She notes that with activity she does have occasional chest pain that is substernal, but that it improves with rest and tramadol.  It has not changed or worsened.  She does not take NTG as it gives her a headache.    For her DM, Her A1C is slightly increased today. She reports dietary indiscretions with increased carbohydrate intake.  She notes that she can do better.  She is taking her medications as prescribed without issue. She has an occasional low blood sugar associated with dizziness, but this is very rare.  She takes insulin 33 units at night and metformin 1gm BID.    She has finished with anastrazole for her breast cancer and has graduated from oncology.    Not smoking.   She further has a diagnosis of schizophrenia and takes zyprexa and seroquel with good results.  She has a h/o DVT, but is no longer on anticoagulation.    Medications not changed and she has no issues taking them.    Review of Systems  Constitutional: Negative for fever and chills.  HENT: Negative for ear discharge and ear pain.   Eyes: Negative for photophobia and visual disturbance.  Respiratory: Negative for shortness of breath and wheezing.   Cardiovascular: Negative for chest pain and leg swelling.  Gastrointestinal: Negative for nausea, vomiting, abdominal pain, diarrhea and constipation.  Genitourinary: Negative for dysuria and difficulty urinating.  Musculoskeletal: Positive for arthralgias. Negative for back pain.  Skin: Negative for rash and wound.  Neurological: Positive for dizziness (occasional, associated with low blood sugar. ). Negative for seizures and weakness.       + recent mechanical fall, hit  head, no neurologic sequelae  Psychiatric/Behavioral: Negative for dysphoric mood and decreased concentration.       Objective:   Physical Exam  Constitutional: She is oriented to person, place, and time. She appears well-developed and well-nourished. No distress.  HENT:  Head: Normocephalic and atraumatic.  Eyes: Conjunctivae are normal. No scleral icterus.  Neck: Neck supple.  Cardiovascular: Normal rate, regular rhythm and normal heart sounds.   No murmur heard. Pulmonary/Chest: Effort normal and breath sounds normal.  Abdominal: Soft. Bowel sounds are normal. She exhibits no distension.  Musculoskeletal: She exhibits no edema and no tenderness.  Neurological: She is alert and oriented to person, place, and time.  Skin: Skin is warm and dry.  Psychiatric: Her behavior is normal.    Lipid panel today     Assessment & Plan:  RTC in 3 months for DM review.

## 2014-06-20 NOTE — Progress Notes (Signed)
ID: Cynthia Griffin   DOB: 1945-10-08  MR#: [id number]  OLM#:[id number]  PCP: Eliezer Lofts, MD GYN:  SU: Stark Klein OTHER MD: Kyung Rudd, Johnnette Gourd, Crissie Reese  ALERT: Another patient with the same date of birth, middle initial, and also with a history of breast cancer is in actinic. Please make sure when dealing with Cynthia Griffin that you have the correct medical record number   CHIEF COMPLAINT:  Estrogen receptor positive breast cancer  CURRENT TREATMENT: Tamoxifen  HISTORY OF PRESENT ILLNESS: From the original intake note:  The patient tells me she had an unremarkable screening mammogram January of 2013 in [place]. I do not have that report. Shortly after that, February of 2013, she noted that her left nipple had become inverted. She initially thought this was due to the mammogram itself, but eventually brought it to her primary care physician's attention. This was followed and there was no apparent change when she was next seen December 2013. The patient however became concerned and decided to go back to [facility] for her annual mammography This is where she had her annual mammograms until the last couple of years. Bilateral diagnostic mammography and left ultrasonography there 10/24/2012 showed definite nipple deformity with inversion and retraction. There was subtle architectural distortion and increase in density relative to the preceding study. Left breast ultrasonography in addition to the nipple retraction and inversion showed an area of hyperechoic architectural distortion measuring 1.4 cm in diameter with Doppler flow. Breast specific gamma imaging was obtained February 2024 and confirmed an area of moderate intensity isotope activity following a radial distribution from the left nipple measuring at least 4 cm. 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  Bilateral breast MRI was obtained 10/31/2012 and showed an area of diffuse non-masslike asymmetric enhancement in the inferior aspect of the left breast measuring up to 7.4 cm transversely. There were no enlarged axillary or internal mammary nodes noted and the right breast was unremarkable.   The patient's subsequent history is as detailed below.  INTERVAL HISTORY: Cynthia Griffin returns today for followup of her breast cancer. Interval history is significant for her having undergone a left latissimus flap reconstruction which necrosis. She has made a definitive decision that she does not want any further reconstruction. She continues on tamoxifen, with very good tolerance. Hot flashes and vaginal wetness are not major issues. She gets the drug at a very good price. She is walking some, but recently returned from a 3-1/2 week trip out [place] and that interrupted her "training"..  REVIEW OF SYSTEMS:  Cynthia Griffin's one complaint is night cramps. Affect her feet and calves. She is not sure much fluid he drinks during the day. She does tend to eat food most days, she says. Otherwise a detailed review of systems today was entirely noncontributory  PAST MEDICAL HISTORY: Past Medical History  Diagnosis Date  . Arthritis   . Anxiety   . Carpal tunnel syndrome of right wrist   . Family history of anesthesia complication     pneumothorax - post op 1980's  . Breast cancer 11/14/12    left, ER +, PR -, HER 2 -  . Pneumothorax on right March, 2014    while having port-a-cath placed  . Pneumonia   . 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 Hx of radiation therapy 05/09/13-06/22/13    left breast 60.4Gy  . Anemia     hx    PAST SURGICAL HISTORY: Past Surgical History  Procedure Laterality Date  . Appendectomy    . Tonsillectomy    . Meniscus repair      Right Knee  . Mastectomy w/ sentinel node biopsy Left 11/14/2012     Procedure: MASTECTOMY WITH SENTINEL LYMPH NODE BIOPSY;  Surgeon: Stark Klein, MD;  Location: [facility];  Service: General;  Laterality: Left;  . Axillary lymph node dissection Left 12/01/2012    Procedure: AXILLARY LYMPH NODE DISSECTION;  Surgeon: Stark Klein, MD;  Location: [facility];  Service: General;  Laterality: Left;  . Portacath placement Right 12/01/2012    Procedure: INSERTION PORT-A-CATH;  Surgeon: Stark Klein, MD;  Location: [facility];  Service: General;  Laterality: Right;  . Abdominal hysterectomy  1992    fibroids, total  . Mastectomy Left December 20, 2012  . Eye surgery Bilateral 2006    lasik surgery  . Port-a-cath removal N/A 05/02/2013    Procedure: REMOVAL PORT-A-CATH;  Surgeon: Stark Klein, MD;  Location: [facility];  Service: General;  Laterality: N/A;  . Shoulder arthroscopy Right 1/15    rotator cuff repair  . Latissimus flap to breast Left 03/29/2014    dr Harlow Mares  . Latissimus flap to breast Left 03/29/2014    Procedure: LEFT LATISSIMUS FLAP;  Surgeon: Crissie Reese, MD;  Location: [facility];  Service: Plastics;  Laterality: Left;   right shoulder rotator cuff repair, right hand carpal tunnel syndrome, bilateral salpingo--oophorectomy with her hysterectomy in the 1990s, Lasix surgery, right toe surgery  FAMILY HISTORY Family History  Problem Relation Age of Onset  . Bladder Cancer Father 2  . Lung cancer Father 42  . Diabetes Father   . Bone cancer Paternal Uncle     ? late 23s  . Diabetes Mother   . Peripheral vascular disease Mother   . Multiple sclerosis Sister   . Parkinson's disease Paternal Grandmother   . Heart disease Paternal Grandfather    the patient's father died at the age of 97. He was diagnosed with lung cancer the age of 43 and with bladder cancer at the age of 42. He was a smoker. The patient's mother is living at age 25. She has a history of basal cell carcinoma. Elodie had no brothers, one sister. There is no history of breast  or ovarian cancer in the immediate family.  GYNECOLOGIC HISTORY: Menarche age 48, first live birth age 58, she is [place] P2. She had her hysterectomy she thinks in 1992, and took hormone replacement for approximately 18 years. In addition she took birth control pills between 1968 and 1977 stopping only to conceive.  SOCIAL HISTORY: She is a retired Web designer. Her husband Cynthia Griffin (goes by Francee Piccolo) is in Press photographer for at [employer] in [place]. Son Marielouise Amey lives in [place] and works in Pharmacologist. Son R.Antonela Freiman II lives in [place] and works in Press photographer. The patient has 5 grandchildren" one on the way".   ADVANCED DIRECTIVES: In place  HEALTH MAINTENANCE:  (Updated 09/04/2013) History  Substance Use Topics  . Smoking status: Former Smoker -- 1.25 packs/day for 29 years    Types: Cigarettes    Quit date: 09/07/1992  . Smokeless tobacco: Never Used  . Alcohol Use: 4.2 oz/week    7 Glasses of wine per week  Colonoscopy: August 2003  PAP: Status post hysterectomy  Bone density: Never - scheduled for Feb 2015  Lipid panel: Dr. Diona Browner   Allergies  Allergen Reactions  . Sulfa Antibiotics Nausea Only and Other (See Comments)    Stomach cramps    Current Outpatient Prescriptions  Medication Sig Dispense Refill  . calcium citrate-vitamin D (CITRACAL+D) 315-200 MG-UNIT per tablet Take 1 tablet by mouth 2 (two) times daily.       . citalopram (CELEXA) 40 MG tablet Take 40 mg by mouth daily.      Marland Kitchen docusate sodium 100 MG CAPS Take 100 mg by mouth daily.  10 capsule  0  . enoxaparin (LOVENOX) 40 MG/0.4ML injection Inject 0.4 mLs (40 mg total) into the skin daily.  12 Syringe  0  . HYDROmorphone (DILAUDID) 2 MG tablet Take 1-2 tablets (2-4 mg total) by mouth every 4 (four) hours as needed for moderate pain.  40 tablet  0  . methocarbamol (ROBAXIN) 500 MG tablet Take 1 tablet (500 mg total) by mouth 4 (four) times daily -  with meals and  at bedtime.  30 tablet  1  . Multiple Vitamin (MULTIVITAMIN WITH MINERALS) TABS Take 1 tablet by mouth daily.      . tamoxifen (NOLVADEX) 20 MG tablet Take 1 tablet (20 mg total) by mouth daily.  90 tablet  3   No current facility-administered medications for this visit.    OBJECTIVE: Middle-aged white woman in no acute distress Filed Vitals:   06/20/14 1127  BP: 139/65  Pulse: 70  Temp: 98 F (36.7 C)  Resp: 18     Body mass index is 27.31 kg/(m^2).    ECOG FS: 0  Filed Weights   06/20/14 1127  Weight: 159 lb 3.2 oz (72.213 kg)   Sclerae unicteric, pupils equal and reactive Oropharynx clear and moist-- no thrush No cervical or supraclavicular adenopathy Lungs no rales or rhonchi Heart regular rate and rhythm Abd soft, nontender, positive bowel sounds MSK no focal spinal tenderness, no upper extremity lymphedema Neuro: nonfocal, well oriented, appropriate affect Breasts: Right breast is unremarkable. The left breast is status post mastectomy and radiation. There is no evidence of chest wall recurrence. The left axilla is benign.    LAB RESULTS: Lab Results  Component Value Date   WBC 6.7 06/20/2014   NEUTROABS 4.3 06/20/2014   HGB 12.0 06/20/2014   HCT 38.0 06/20/2014   MCV 89.0 06/20/2014   PLT 255 06/20/2014   CMP     Component Value Date/Time   NA 144 06/20/2014 1059   NA 141 03/21/2014 1330   K 4.0 06/20/2014 1059   K 4.3 03/21/2014 1330   CL 102 03/21/2014 1330   CL 107 02/21/2013 1028   CO2 28 06/20/2014 1059   CO2 27 03/21/2014 1330   GLUCOSE 114 06/20/2014 1059   GLUCOSE 89 03/21/2014 1330   GLUCOSE 97 02/21/2013 1028   BUN 13.4 06/20/2014 1059   BUN 15 03/21/2014 1330   CREATININE 0.9 06/20/2014 1059   CREATININE 0.82 03/21/2014 1330   CALCIUM 9.2 06/20/2014 1059   CALCIUM 9.0 03/21/2014 1330   PROT 6.6 06/20/2014 1059   ALBUMIN 3.5 06/20/2014 1059   AST 18 06/20/2014 1059   ALT 21 06/20/2014 1059   ALKPHOS 54 06/20/2014 1059   BILITOT 0.36 06/20/2014  1059   GFRNONAA 73* 03/21/2014 1330   GFRAA 85* 03/21/2014 1330    STUDIES: No results found.  ASSESSMENT: 68 y.o. [place] woman   (  1)  status post left mastectomy and axillary lymph node dissection March 2014 for a pT3 pN2, stage IIIA invasive lobular carcinoma, grade 1, estrogen receptor 100% positive, progesterone receptor and HER-2 negative, with an MIB-1 of 8%.  (2) R pneumothorax with port placement March 2014- resolved  (3)  treated in the adjuvant setting with docetaxel/ doxorubicin/ cyclophosphamide, the original plan being to complete 6 q. three-week doses, with Neulasta given on day 2 for granulocyte support  (4) switched to carboplatin and gemcitabine given day 1 only for cycles 5 and 6 of chemotherapy because of concerns regarding developing neuropathy; completed 04/04/2013  (5)  lymphedema, left upper extremity, grade 1  (6) received postmastectomy radiation, completed mid October  (7)  started on tamoxifen, 20 mg daily, in late October 2014  (8) underwent a left latissimus flap reconstruction 03/29/2014, unfortunately with flap necrosis. The patient does not intend further reconstruction.  (9)  osteopenia with a T score of -2.0 February 2015   PLAN:  Joleen is doing terrific as far as breast cancer is concerned. She is tolerating tamoxifen with no obvious side effects. I am not convinced that the cramps she is experiencing at night are related. In any case we talked about hydration, potassium issues (mostly to eat more fruit), and went over some stretches she can do before going to bed.  I also think she would benefit from a more regular exercise program. I gave her a copy of the [employer]. If she does start a program I did ask her to keep tabs on whether she gets more or fewer cramps when she is more or less active.  Otherwise she will have her next mammogram in February and she will see me again in in April. She knows to call for any problems that may  develop before her next visit here.  Chauncey Cruel, MD     06/20/2014

## 2014-06-22 NOTE — Assessment & Plan Note (Signed)
MMG after study showed no sign of malignancy.

## 2014-06-22 NOTE — Assessment & Plan Note (Signed)
BP Readings from Last 3 Encounters:  06/20/14 134/66  12/04/13 160/82  11/23/13 184/95    Lab Results  Component Value Date   NA 140 11/23/2013   K 4.4 11/23/2013   CREATININE 0.7 11/23/2013    Assessment: Blood pressure control: controlled Progress toward BP goal:  at goal Comments: Better controlled today  Plan: Medications:  continue current medications Educational resources provided:   Self management tools provided:   Other plans: Renal function stable this year, check at next visit.

## 2014-06-22 NOTE — Assessment & Plan Note (Signed)
CT chest with concerning findings noted above, she is due for repeat which I will call her to discuss.  She has had a mammogram which showed no evidence of malignancy.

## 2014-06-22 NOTE — Assessment & Plan Note (Signed)
Finished anastrazole and graduated from oncology.  Continues with yearly mammograms, last in March showed no evidence of malignancy.

## 2014-06-22 NOTE — Assessment & Plan Note (Addendum)
Lab Results  Component Value Date   HGBA1C 7.3 06/20/2014   HGBA1C 6.4 10/16/2013   HGBA1C 6.9 02/13/2013     Assessment: Diabetes control: fair control Progress toward A1C goal:  deteriorated Comments: She has had dietary indiscretions.   Plan: Medications:  continue current medications, metformin, lantus Home glucose monitoring: Frequency: 2 times a day Timing:   Instruction/counseling given: reminded to bring blood glucose meter & log to each visit Educational resources provided:   Self management tools provided:   Other plans:   LDL 44 BP well controlled

## 2014-06-22 NOTE — Assessment & Plan Note (Signed)
Stable angina, improved with tramadol.  Red flag symptoms were gone over with patient.

## 2014-06-22 NOTE — Assessment & Plan Note (Signed)
LDL today was 44, continue pravastatin

## 2014-06-26 ENCOUNTER — Telehealth: Payer: Self-pay | Admitting: *Deleted

## 2014-06-26 NOTE — Telephone Encounter (Signed)
Pt called about Chest CT appt on Friday 10/23 @ 1030AM (arrive 1015AM); no answer - message left.

## 2014-06-29 ENCOUNTER — Ambulatory Visit (HOSPITAL_COMMUNITY): Payer: Medicare Other

## 2014-07-06 ENCOUNTER — Ambulatory Visit (HOSPITAL_COMMUNITY)
Admission: RE | Admit: 2014-07-06 | Discharge: 2014-07-06 | Disposition: A | Discharge status: Home / Self Care | Payer: Medicare Other | Source: Ambulatory Visit | Attending: Diagnostic Radiology | Admitting: Diagnostic Radiology

## 2014-07-06 DIAGNOSIS — R918 Other nonspecific abnormal finding of lung field: Secondary | ICD-10-CM | POA: Diagnosis present

## 2014-07-06 DIAGNOSIS — J984 Other disorders of lung: Secondary | ICD-10-CM | POA: Insufficient documentation

## 2014-07-06 DIAGNOSIS — Z853 Personal history of malignant neoplasm of breast: Secondary | ICD-10-CM | POA: Insufficient documentation

## 2014-07-06 DIAGNOSIS — J439 Emphysema, unspecified: Secondary | ICD-10-CM | POA: Insufficient documentation

## 2014-07-06 DIAGNOSIS — K868 Other specified diseases of pancreas: Secondary | ICD-10-CM | POA: Insufficient documentation

## 2014-07-09 ENCOUNTER — Telehealth: Payer: Self-pay | Admitting: *Deleted

## 2014-07-09 ENCOUNTER — Other Ambulatory Visit: Payer: Self-pay | Admitting: Internal Medicine

## 2014-07-09 DIAGNOSIS — E1169 Type 2 diabetes mellitus with other specified complication: Secondary | ICD-10-CM

## 2014-07-09 DIAGNOSIS — K219 Gastro-esophageal reflux disease without esophagitis: Secondary | ICD-10-CM

## 2014-07-09 DIAGNOSIS — I10 Essential (primary) hypertension: Secondary | ICD-10-CM

## 2014-07-09 DIAGNOSIS — E785 Hyperlipidemia, unspecified: Principal | ICD-10-CM

## 2014-07-09 NOTE — Telephone Encounter (Signed)
Dr. Alfonso Patten - - I would call Ms. Huckaba then and discuss over the phone if you can.  Let me know if you have any questions/concerns.  We can discuss further before you call as well.

## 2014-07-09 NOTE — Telephone Encounter (Signed)
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 These findings have not significantly changed since 10/02/2013. No acute chest abnormality.  Indeterminate low-density structures involving the pancreatic tail. These are concerning for small cystic lesions and recommend more definitive evaluation with an abdominal MRI (with and without contrast), particularly since the patient has a history of breast Cancer.  Can we schedule this patient with a follow up appointment? We will likely need to order her an outpatient abdominal MRI and we can also discuss the CT findings with her.  Dr. Daryll Drown, please let me know if we should take a different course of action.  Thank you! [place]

## 2014-07-09 NOTE — Telephone Encounter (Signed)
No appointments with Dr Marvel Plan until 11/18.

## 2014-07-09 NOTE — Telephone Encounter (Signed)
Received a call from Radiology with result of CT scan of Chest from 07/06/14. Results in EPIC, will forward to Dr Daryll Drown and Dr Marvel Plan

## 2014-07-09 NOTE — Telephone Encounter (Signed)
Yes Dr. Marvel Plan, that is a good plan.  I think she should come in to discuss results so that all questions can be answered and an MRI can be planned.  She may need prior authorization for the study.  Alternatively, if there is not a clinic appointment with Dr. Marvel Plan in the near future (< 2 week), Alexa could call and discuss results and plan follow up study over the phone.    Thanks.

## 2014-07-09 NOTE — Telephone Encounter (Signed)
Chart opened in error Looking for Cynthia Griffin with same birthday.

## 2014-07-11 NOTE — Telephone Encounter (Signed)
Talked with Dr Marvel Plan and she will call pt today.

## 2014-07-12 NOTE — Telephone Encounter (Signed)
I called and talked to Janet Oxford, Ms. [name] daughter and primary caretaker. I discussed with her the results of the CT Chest and findings within the pancreas. [name] asked what these were and I explained that we did not know at this time, but it could anything from benign to cysts to worst case scenario of cancer. I explained that we recommend a follow up MRI of her abdomen to better assess the findings. She understood and was agreeable with the plan. I will work with [name] tomorrow with setting up [name] with an MRI. Patient's daughter requests a morning MRI.

## 2014-07-12 NOTE — Telephone Encounter (Signed)
Hi Glenda,  Can we start the pre-authorization and get set up an abdominal MRI (with and without contrast) for Ms. [name] for pancreatic lesions? Please let me know if there is anything you need me to do. I can come to clinic tomorrow to discuss with you as well. Thank you!  [place]

## 2014-07-13 ENCOUNTER — Telehealth: Payer: Self-pay | Admitting: *Deleted

## 2014-07-13 ENCOUNTER — Other Ambulatory Visit: Payer: Self-pay | Admitting: Internal Medicine

## 2014-07-13 DIAGNOSIS — K869 Disease of pancreas, unspecified: Secondary | ICD-10-CM | POA: Insufficient documentation

## 2014-07-13 DIAGNOSIS — E1165 Type 2 diabetes mellitus with hyperglycemia: Secondary | ICD-10-CM

## 2014-07-13 DIAGNOSIS — IMO0002 Reserved for concepts with insufficient information to code with codable children: Secondary | ICD-10-CM

## 2014-07-13 DIAGNOSIS — K862 Cyst of pancreas: Secondary | ICD-10-CM | POA: Insufficient documentation

## 2014-07-13 NOTE — Telephone Encounter (Signed)
Talked to pt's daughter - MRI has been scheduled for Monday 11/16 at [facility] @ 1600PM; will need a recent BUN/CREA which can be drawn there prior to the MRI @ 1445PM. Daughter informed. Sent message to Dr Marvel Plan to order lab.

## 2014-07-15 ENCOUNTER — Other Ambulatory Visit: Payer: Self-pay | Admitting: Internal Medicine

## 2014-07-15 DIAGNOSIS — K869 Disease of pancreas, unspecified: Secondary | ICD-10-CM

## 2014-07-15 NOTE — Telephone Encounter (Signed)
Thank you for scheduling the MRI. I have placed an order for a future BMET to be drawn prior to the MRI on 11/16.

## 2014-07-23 ENCOUNTER — Ambulatory Visit (HOSPITAL_COMMUNITY)
Admission: RE | Admit: 2014-07-23 | Discharge: 2014-07-23 | Disposition: A | Discharge status: Home / Self Care | Payer: Medicare Other | Source: Ambulatory Visit | Attending: Internal Medicine | Admitting: Internal Medicine

## 2014-07-23 ENCOUNTER — Other Ambulatory Visit: Payer: Self-pay | Admitting: Internal Medicine

## 2014-07-23 DIAGNOSIS — K869 Disease of pancreas, unspecified: Secondary | ICD-10-CM

## 2014-07-23 DIAGNOSIS — N281 Cyst of kidney, acquired: Secondary | ICD-10-CM | POA: Insufficient documentation

## 2014-07-23 DIAGNOSIS — Z853 Personal history of malignant neoplasm of breast: Secondary | ICD-10-CM | POA: Diagnosis not present

## 2014-07-23 DIAGNOSIS — K862 Cyst of pancreas: Secondary | ICD-10-CM | POA: Diagnosis not present

## 2014-07-23 LAB — CREATININE, SERUM
CREATININE: 0.63 mg/dL (ref 0.50–1.10)
GFR calc non Af Amer: >90 mL/min (ref >90)

## 2014-07-23 MED ORDER — GADOBENATE DIMEGLUMINE 529 MG/ML IV SOLN
20.0000 mL | Freq: Once | INTRAVENOUS | Status: AC | PRN
  Administered 2014-07-23: 18 mL via INTRAVENOUS

## 2014-07-24 NOTE — Progress Notes (Signed)
I have these results and I will call the patient and her daughter to discuss results and recommendations.

## 2014-07-26 ENCOUNTER — Telehealth: Payer: Self-pay | Admitting: Internal Medicine

## 2014-07-26 NOTE — Telephone Encounter (Signed)
I called patient's daughter to update her on the results of the MRI. Results were reassuring. MRI showed small nonaggressive cystic lesions in the pancreatic tail, measuring up to 9 mm, without enhancement. 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 A single follow-up MRI abdomen with/without contrast is suggested in 1 year to document stability. I instructed the daughter to please make an appointment with the clinic if she would like to discuss these results in person or she could call me back. Either way, I will make sure to call her back in a few days if I have not heard either way.

## 2014-07-26 NOTE — Telephone Encounter (Signed)
-----   Message from Bartholomew Crews, MD sent at 07/24/2014  7:34 AM EST -----   ----- Message -----    From: SYSTEM    Sent: 07/24/2014  12:05 AM      To: Bartholomew Crews, MD

## 2014-08-15 ENCOUNTER — Other Ambulatory Visit: Payer: Self-pay | Admitting: Internal Medicine

## 2014-08-15 DIAGNOSIS — E1169 Type 2 diabetes mellitus with other specified complication: Secondary | ICD-10-CM

## 2014-08-15 DIAGNOSIS — K219 Gastro-esophageal reflux disease without esophagitis: Secondary | ICD-10-CM

## 2014-08-15 DIAGNOSIS — E785 Hyperlipidemia, unspecified: Principal | ICD-10-CM

## 2014-08-16 DIAGNOSIS — M79675 Pain in left toe(s): Secondary | ICD-10-CM | POA: Diagnosis not present

## 2014-08-16 DIAGNOSIS — L03032 Cellulitis of left toe: Secondary | ICD-10-CM | POA: Diagnosis not present

## 2014-08-20 DIAGNOSIS — C50512 Malignant neoplasm of lower-outer quadrant of left female breast: Secondary | ICD-10-CM | POA: Diagnosis not present

## 2014-08-22 ENCOUNTER — Other Ambulatory Visit: Payer: Self-pay | Admitting: Internal Medicine

## 2014-08-22 DIAGNOSIS — E118 Type 2 diabetes mellitus with unspecified complications: Secondary | ICD-10-CM

## 2014-08-22 MED ORDER — METFORMIN HCL 1000 MG PO TABS: 1000.0000 mg | ORAL_TABLET | Freq: Two times a day (BID) | ORAL | Status: DC

## 2014-08-23 ENCOUNTER — Other Ambulatory Visit: Payer: Self-pay | Admitting: Family Medicine

## 2014-08-28 ENCOUNTER — Other Ambulatory Visit: Payer: Self-pay | Admitting: Internal Medicine

## 2014-08-28 DIAGNOSIS — IMO0002 Reserved for concepts with insufficient information to code with codable children: Secondary | ICD-10-CM

## 2014-08-28 DIAGNOSIS — E1165 Type 2 diabetes mellitus with hyperglycemia: Secondary | ICD-10-CM

## 2014-08-28 MED ORDER — INSULIN DETEMIR 100 UNIT/ML FLEXPEN: 33.0000 [IU] | PEN_INJECTOR | Freq: Every day | SUBCUTANEOUS | Status: DC

## 2014-09-20 DIAGNOSIS — L03032 Cellulitis of left toe: Secondary | ICD-10-CM | POA: Diagnosis not present

## 2014-10-03 ENCOUNTER — Telehealth: Payer: Self-pay

## 2014-10-03 ENCOUNTER — Encounter: Payer: Self-pay | Admitting: Internal Medicine

## 2014-10-03 DIAGNOSIS — C50319 Malignant neoplasm of lower-inner quadrant of unspecified female breast: Secondary | ICD-10-CM

## 2014-10-03 NOTE — Telephone Encounter (Signed)
Ms. Giles left a message in nurses vm that she called on 10-01-14 to schedule an appointment with Dr. Jana Hakim and has not had a return call.  Pt. To see Dr. Jana Hakim in F/u in April.  Due to Dr. Virgie Dad schedule the first appointment amenable was May 5th.  Pt. appreciated the return call.

## 2014-10-09 ENCOUNTER — Other Ambulatory Visit: Payer: Self-pay | Admitting: Pharmacist

## 2014-10-09 DIAGNOSIS — I10 Essential (primary) hypertension: Secondary | ICD-10-CM

## 2014-10-09 MED ORDER — LISINOPRIL 5 MG PO TABS: 5.0000 mg | ORAL_TABLET | Freq: Every day | ORAL | Status: DC

## 2014-10-11 ENCOUNTER — Telehealth: Payer: Self-pay | Admitting: Internal Medicine

## 2014-10-11 NOTE — Telephone Encounter (Signed)
Call to patient to confirm appointment for 10/12/14 at 1:15, [facility]

## 2014-10-12 ENCOUNTER — Encounter: Payer: Self-pay | Admitting: Internal Medicine

## 2014-10-12 ENCOUNTER — Ambulatory Visit (INDEPENDENT_AMBULATORY_CARE_PROVIDER_SITE_OTHER): Payer: Medicare Other | Admitting: Internal Medicine

## 2014-10-12 VITALS — BP 148/76 | HR 80 | Temp 98.3°F | Wt 204.4 lb

## 2014-10-12 DIAGNOSIS — Z0000 Encounter for general adult medical examination without abnormal findings: Secondary | ICD-10-CM | POA: Diagnosis not present

## 2014-10-12 DIAGNOSIS — C50912 Malignant neoplasm of unspecified site of left female breast: Secondary | ICD-10-CM

## 2014-10-12 DIAGNOSIS — K5901 Slow transit constipation: Secondary | ICD-10-CM | POA: Diagnosis not present

## 2014-10-12 DIAGNOSIS — K219 Gastro-esophageal reflux disease without esophagitis: Secondary | ICD-10-CM

## 2014-10-12 DIAGNOSIS — E1165 Type 2 diabetes mellitus with hyperglycemia: Secondary | ICD-10-CM | POA: Diagnosis not present

## 2014-10-12 DIAGNOSIS — E1169 Type 2 diabetes mellitus with other specified complication: Secondary | ICD-10-CM | POA: Diagnosis not present

## 2014-10-12 DIAGNOSIS — F209 Schizophrenia, unspecified: Secondary | ICD-10-CM

## 2014-10-12 DIAGNOSIS — IMO0002 Reserved for concepts with insufficient information to code with codable children: Secondary | ICD-10-CM

## 2014-10-12 DIAGNOSIS — E785 Hyperlipidemia, unspecified: Secondary | ICD-10-CM

## 2014-10-12 DIAGNOSIS — K869 Disease of pancreas, unspecified: Secondary | ICD-10-CM | POA: Diagnosis not present

## 2014-10-12 DIAGNOSIS — I25110 Atherosclerotic heart disease of native coronary artery with unstable angina pectoris: Secondary | ICD-10-CM

## 2014-10-12 DIAGNOSIS — I10 Essential (primary) hypertension: Secondary | ICD-10-CM | POA: Diagnosis not present

## 2014-10-12 DIAGNOSIS — E119 Type 2 diabetes mellitus without complications: Secondary | ICD-10-CM | POA: Diagnosis not present

## 2014-10-12 LAB — POCT GLYCOSYLATED HEMOGLOBIN (HGB A1C): Hemoglobin A1C: 7.6

## 2014-10-12 LAB — BASIC METABOLIC PANEL WITH GFR
BUN: 9 mg/dL (ref 6–23)
CHLORIDE: 106 meq/L (ref 96–112)
CO2: 27 mEq/L (ref 19–32)
Calcium: 9.3 mg/dL (ref 8.4–10.5)
Creat: 0.61 mg/dL (ref 0.50–1.10)
GFR, Est African American: >89 mL/min
GFR, Est Non African American: >89 mL/min
Glucose, Bld: 194 mg/dL — ABNORMAL HIGH (ref 70–99)
POTASSIUM: 4.1 meq/L (ref 3.5–5.3)
SODIUM: 141 meq/L (ref 135–145)

## 2014-10-12 LAB — GLUCOSE, CAPILLARY: Glucose-Capillary: 177 mg/dL — ABNORMAL HIGH (ref 70–99)

## 2014-10-12 MED ORDER — TRAMADOL HCL 50 MG PO TABS: 100.0000 mg | ORAL_TABLET | Freq: Three times a day (TID) | ORAL | Status: DC | PRN

## 2014-10-12 NOTE — Assessment & Plan Note (Signed)
Lab Results  Component Value Date   HGBA1C 7.6 10/12/2014   HGBA1C 7.3 06/20/2014   HGBA1C 6.4 10/16/2013     Assessment: Diabetes control:  Uncontrolled Progress toward A1C goal:   Deteriorated Comments: Family admits to missed doses of Levemir and overcompensating for feeling "low." A1c has also trended up with weight.  Plan: Medications:  continue current medications of metformin 1000 mg BID and Levemir 33 units at night. Home glucose monitoring: Frequency:  daily Timing:  QHS-- discussed changing to QAM Instruction/counseling given: reminded to bring blood glucose meter & log to each visit, discussed the need for weight loss and discussed diet Other plans: Repeat A1c in 3 months. If still elevated or increasing, will increase levemir

## 2014-10-12 NOTE — Assessment & Plan Note (Signed)
2/2 opioid use. Controlled with miralax.  Plan: -Continue Miralax prn

## 2014-10-12 NOTE — Assessment & Plan Note (Signed)
Flu Vaccine: Refused  Pneumococcal Vaccine: Refused  Colonoscopy: Refused, willing to do repeat stool cards. Stool cards sent with patient.  Mammogram: Needs repeat. Daughter will schedule for March 2016.  DEXA: Due February 2017

## 2014-10-12 NOTE — Assessment & Plan Note (Signed)
Stable angina. Denies increasing chest pain or shortness of breath. Patient is compliant with ASA, statin and tramadol.   Plan: -Continue ASA 81 mg daily -Continue pravastatin 40 mg daily -Continue tramadol 50 mg Q8H prn pain

## 2014-10-12 NOTE — Assessment & Plan Note (Signed)
Controlled.  Plan: -Continue Zantac 150 mg BID

## 2014-10-12 NOTE — Assessment & Plan Note (Signed)
Compliant with pravastatin 40 mg daily. Chol 99, TG 139, HDL 27, LDL 44. Discussed diet options for increased HDL.  Plan: -Continue pravastatin 40 mg daily

## 2014-10-12 NOTE — Progress Notes (Signed)
Subjective:    Patient ID: [name], female    DOB: Mar 22, 1946, 69 y.o.   MRN: [id number]  HPI [name] is a 69 yo female with PMHx of CAD, HTN, GERD, HLD, T2DM, h/o breast cancer, schizophrenia who presents for routine follow up. Please see problem oriented assessment and plan for more information.  Review of Systems General: Denies fever, chills, fatigue, change in appetite, or weight loss Respiratory: Denies SOB, cough   Cardiovascular: Denies active chest pain and palpitations.  Gastrointestinal: Admits to chronic constipation. Denies diarrhea, blood in stool and abdominal distention.  Musculoskeletal: Denies myalgias, back pain Skin: Denies rash and wounds.  Neurological: Denies dizziness, headaches, weakness, lightheadedness, numbness Psychiatric/Behavioral: Denies mood changes, confusion, sleep disturbance   Past Medical History  Diagnosis Date  . Diabetes mellitus   . GERD (gastroesophageal reflux disease)   . Hyperlipidemia   . Hypertension   . Schizophrenia   . Atypical nevus   . History of mitral valve prolapse   . Breast cancer 07/14/06    Left breast. Ductal carcinoma, grade 3. Followed by Dr. Truddie Coco. s/p lumpectomy with radiation  . Goiter     U/S 08/2009-persistent multiple small nodules, one in the right lower pole slightly enlarged, recommend continued followup.  . Ovarian cyst   . Fibroids   . DVT (deep vein thrombosis) in pregnancy 1982    Affected the RLE. Required coumadin.  . Dental caries    Outpatient Encounter Prescriptions as of 10/12/2014  Medication Sig  . aspirin 81 MG tablet Take 1 tablet (81 mg total) by mouth daily.  . calcium-vitamin D (OSCAL 500/200 D-3) 500-200 MG-UNIT per tablet Take 1 tablet by mouth 3 (three) times daily.  . Insulin Detemir (LEVEMIR FLEXPEN) 100 UNIT/ML Pen Inject 33 Units into the skin daily at 10 pm.  . lisinopril (PRINIVIL,ZESTRIL) 5 MG tablet Take 1 tablet (5 mg total) by mouth daily.  . metFORMIN (GLUCOPHAGE)  1000 MG tablet Take 1 tablet (1,000 mg total) by mouth 2 (two) times daily with a meal.  . metoprolol (LOPRESSOR) 50 MG tablet TAKE TWO TABLETS BY MOUTH TWICE DAILY  . OLANZapine (ZYPREXA) 10 MG tablet Take 30 mg by mouth at bedtime.   . polyethylene glycol (MIRALAX / GLYCOLAX) packet Take 17 g by mouth daily.  . pravastatin (PRAVACHOL) 40 MG tablet TAKE ONE TABLET BY MOUTH ONCE DAILY  . QUEtiapine (SEROQUEL) 50 MG tablet Take 150 mg by mouth at bedtime.  . ranitidine (ZANTAC) 150 MG tablet TAKE ONE TABLET BY MOUTH TWICE DAILY  . traMADol (ULTRAM) 50 MG tablet Take 2 tablets (100 mg total) by mouth every 8 (eight) hours as needed for moderate pain.  . [DISCONTINUED] traMADol (ULTRAM) 50 MG tablet Take 2 tablets (100 mg total) by mouth every 8 (eight) hours as needed for moderate pain.      Objective:   Physical Exam Filed Vitals:   10/12/14 1320  BP: 148/76  Pulse: 80  Temp: 98.3 F (36.8 C)  TempSrc: Oral  Weight: 204 lb 6.4 oz (92.715 kg)  SpO2: 98%   General: Vital signs reviewed.  Patient is well-developed and well-nourished, in no acute distress and cooperative with exam.  Neck: No carotid bruit present.  Cardiovascular: RRR, S1 normal, S2 normal, no murmurs, gallops, or rubs. Pulmonary/Chest: Clear to auscultation bilaterally, no wheezes, rales, or rhonchi. Abdominal: Soft, non-tender, non-distended, BS + Extremities: No lower extremity edema bilaterally Skin: Warm, dry and intact. No rashes or erythema. Psychiatric: Normal mood  and affect. speech and behavior is normal. Cognition and memory are normal.     Assessment & Plan:   Please see problem based assessment and plan.

## 2014-10-12 NOTE — Progress Notes (Signed)
[facility] Medicine Clinic Attending  Case discussed with Dr. Richardson at the time of the visit.  We reviewed the resident's history and exam and pertinent patient test results.  I agree with the assessment, diagnosis, and plan of care documented in the resident's note. 

## 2014-10-12 NOTE — Assessment & Plan Note (Signed)
Patient has completed anastrazole and no longer follows with oncology. Patient gets year mammogram. Last mammogram normal March 2015. Repeat due March 2016. Daughter states she will make appointment today.  Plan: -Repeat Mammogram

## 2014-10-12 NOTE — Assessment & Plan Note (Signed)
BP Readings from Last 3 Encounters:  10/12/14 148/76  06/20/14 134/66  12/04/13 160/82    Lab Results  Component Value Date   NA 140 11/23/2013   K 4.4 11/23/2013   CREATININE 0.63 07/23/2014    Assessment: Blood pressure control:  Uncontrolled Progress toward BP goal:   Above goal Comments: Patient does not check BP at home, compliant with lisinopril 5 mg daily and metoprolol 50 mg bid  Plan: Medications:  continue current medications Other plans: Given that BP was previously well controlled, patient's daughter will buy bp cuff for home use and check. Goal is <140/90. If consistently elevated, daughter will notify clinic. Consider increasing lisinopril as necessary. Will check BMET today.

## 2014-10-12 NOTE — Patient Instructions (Signed)
General Instructions:   Please bring your medicines with you each time you come to clinic.  Medicines may include prescription medications, over-the-counter medications, herbal remedies, eye drops, vitamins, or other pills.   Please continue all medications as prescribed.   Please try to check blood sugar in the morning for a more accurate reading.  Please use stool cards and mail into clinic. Please schedule mammogram for March 2016.   Basic Carbohydrate Counting for Diabetes Mellitus Carbohydrate counting is a method for keeping track of the amount of carbohydrates you eat. Eating carbohydrates naturally increases the level of sugar (glucose) in your blood, so it is important for you to know the amount that is okay for you to have in every meal. Carbohydrate counting helps keep the level of glucose in your blood within normal limits. The amount of carbohydrates allowed is different for every person. A dietitian can help you calculate the amount that is right for you. Once you know the amount of carbohydrates you can have, you can count the carbohydrates in the foods you want to eat. Carbohydrates are found in the following foods:  Grains, such as breads and cereals.  Dried beans and soy products.  Starchy vegetables, such as potatoes, peas, and corn.  Fruit and fruit juices.  Milk and yogurt.  Sweets and snack foods, such as cake, cookies, candy, chips, soft drinks, and fruit drinks. CARBOHYDRATE COUNTING There are two ways to count the carbohydrates in your food. You can use either of the methods or a combination of both. Reading the "Nutrition Facts" on [place] The "Nutrition Facts" is an area that is included on the labels of almost all packaged food and beverages in the [place]. It includes the serving size of that food or beverage and information about the nutrients in each serving of the food, including the grams (g) of carbohydrate per serving.  Decide the number  of servings of this food or beverage that you will be able to eat or drink. Multiply that number of servings by the number of grams of carbohydrate that is listed on the label for that serving. The total will be the amount of carbohydrates you will be having when you eat or drink this food or beverage. Learning Standard Serving Sizes of Food When you eat food that is not packaged or does not include "Nutrition Facts" on the label, you need to measure the servings in order to count the amount of carbohydrates.A serving of most carbohydrate-rich foods contains about 15 g of carbohydrates. The following list includes serving sizes of carbohydrate-rich foods that provide 15 g ofcarbohydrate per serving:   1 slice of bread (1 oz) or 1 six-inch tortilla.    of a hamburger bun or English muffin.  4-6 crackers.   cup unsweetened dry cereal.    cup hot cereal.   cup rice or pasta.    cup mashed potatoes or  of a large baked potato.  1 cup fresh fruit or one small piece of fruit.    cup canned or frozen fruit or fruit juice.  1 cup milk.   cup plain fat-free yogurt or yogurt sweetened with artificial sweeteners.   cup cooked dried beans or starchy vegetable, such as peas, corn, or potatoes.  Decide the number of standard-size servings that you will eat. Multiply that number of servings by 15 (the grams of carbohydrates in that serving). For example, if you eat 2 cups of strawberries, you will have eaten 2 servings and  30 g of carbohydrates (2 servings x 15 g = 30 g). For foods such as soups and casseroles, in which more than one food is mixed in, you will need to count the carbohydrates in each food that is included. EXAMPLE OF CARBOHYDRATE COUNTING Sample Dinner  3 oz chicken breast.   cup of brown rice.   cup of corn.  1 cup milk.   1 cup strawberries with sugar-free whipped topping.  Carbohydrate Calculation Step 1: Identify the foods that contain carbohydrates:    Rice.   Corn.   Milk.   Strawberries. Step 2:Calculate the number of servings eaten of each:   2 servings of rice.   1 serving of corn.   1 serving of milk.   1 serving of strawberries. Step 3: Multiply each of those number of servings by 15 g:   2 servings of rice x 15 g = 30 g.   1 serving of corn x 15 g = 15 g.   1 serving of milk x 15 g = 15 g.   1 serving of strawberries x 15 g = 15 g. Step 4: Add together all of the amounts to find the total grams of carbohydrates eaten: 30 g + 15 g + 15 g + 15 g = 75 g. Document Released: 08/24/2005 Document Revised: 01/08/2014 Document Reviewed: 07/21/2013 [facility] Patient Information 2015 [place], [place]. This information is not intended to replace advice given to you by your health care provider. Make sure you discuss any questions you have with your health care provider.

## 2014-10-12 NOTE — Assessment & Plan Note (Signed)
Follows with [facility]. Compliant with Seroquel 150 mg QHS and Olanzapine 30 mg QHS. May be source of weight gain.  Plan: -Continue medications as above

## 2014-10-12 NOTE — Assessment & Plan Note (Signed)
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 Recommend repeat MRI in one year.  Plan: -Repeat MRI abdomen November 2016

## 2014-10-13 LAB — MICROALBUMIN / CREATININE URINE RATIO
Creatinine, Urine: 84.1 mg/dL
MICROALB UR: 8.7 mg/dL — AB (ref <2.0)
Microalb Creat Ratio: 103.4 mg/g — ABNORMAL HIGH (ref 0.0–30.0)

## 2014-10-16 ENCOUNTER — Telehealth: Payer: Self-pay | Admitting: Family Medicine

## 2014-10-16 DIAGNOSIS — M8580 Other specified disorders of bone density and structure, unspecified site: Secondary | ICD-10-CM

## 2014-10-16 DIAGNOSIS — Z13220 Encounter for screening for lipoid disorders: Secondary | ICD-10-CM

## 2014-10-16 NOTE — Telephone Encounter (Signed)
-----   Message from Ellamae Sia sent at 10/11/2014  5:16 PM EST ----- Regarding: Lab orders for Wednesday, 2.10.16 Patient is scheduled for CPX labs, please order future labs, Thanks , Karna Christmas

## 2014-10-17 ENCOUNTER — Other Ambulatory Visit (INDEPENDENT_AMBULATORY_CARE_PROVIDER_SITE_OTHER): Payer: Medicare Other

## 2014-10-17 DIAGNOSIS — C50319 Malignant neoplasm of lower-inner quadrant of unspecified female breast: Secondary | ICD-10-CM | POA: Diagnosis not present

## 2014-10-17 DIAGNOSIS — Z13220 Encounter for screening for lipoid disorders: Secondary | ICD-10-CM

## 2014-10-17 DIAGNOSIS — M8580 Other specified disorders of bone density and structure, unspecified site: Secondary | ICD-10-CM

## 2014-10-17 LAB — VITAMIN D 25 HYDROXY (VIT D DEFICIENCY, FRACTURES): VITD: 40.52 ng/mL (ref 30.00–100.00)

## 2014-10-17 LAB — LIPID PANEL
Cholesterol: 160 mg/dL (ref 0–200)
HDL: 68.2 mg/dL (ref >39.00)
LDL Cholesterol: 74 mg/dL (ref 0–99)
NONHDL: 91.8
Total CHOL/HDL Ratio: 2
Triglycerides: 87 mg/dL (ref 0.0–149.0)
VLDL: 17.4 mg/dL (ref 0.0–40.0)

## 2014-10-17 LAB — COMPREHENSIVE METABOLIC PANEL
ALBUMIN: 4 g/dL (ref 3.5–5.2)
ALT: 15 U/L (ref 0–35)
AST: 17 U/L (ref 0–37)
Alkaline Phosphatase: 40 U/L (ref 39–117)
BUN: 15 mg/dL (ref 6–23)
CHLORIDE: 106 meq/L (ref 96–112)
CO2: 30 mEq/L (ref 19–32)
CREATININE: 0.9 mg/dL (ref 0.40–1.20)
Calcium: 9.6 mg/dL (ref 8.4–10.5)
GFR: 66.35 mL/min (ref >60.00)
Glucose, Bld: 94 mg/dL (ref 70–99)
Potassium: 4 mEq/L (ref 3.5–5.1)
SODIUM: 141 meq/L (ref 135–145)
Total Bilirubin: 0.4 mg/dL (ref 0.2–1.2)
Total Protein: 6.7 g/dL (ref 6.0–8.3)

## 2014-10-18 ENCOUNTER — Other Ambulatory Visit: Payer: Self-pay | Admitting: Internal Medicine

## 2014-10-19 ENCOUNTER — Other Ambulatory Visit: Payer: Self-pay | Admitting: Internal Medicine

## 2014-10-23 ENCOUNTER — Other Ambulatory Visit: Payer: Self-pay | Admitting: Internal Medicine

## 2014-10-23 NOTE — Telephone Encounter (Signed)
This medication was refilled by Dr. Marvel Plan on 10/19/2014; please confirm that the pharmacy received the refill.

## 2014-10-24 ENCOUNTER — Ambulatory Visit (INDEPENDENT_AMBULATORY_CARE_PROVIDER_SITE_OTHER): Payer: Medicare Other | Admitting: Family Medicine

## 2014-10-24 ENCOUNTER — Encounter: Payer: Self-pay | Admitting: Family Medicine

## 2014-10-24 VITALS — BP 110/62 | HR 74 | Temp 98.2°F | Ht 64.25 in | Wt 157.0 lb

## 2014-10-24 DIAGNOSIS — Z23 Encounter for immunization: Secondary | ICD-10-CM | POA: Diagnosis not present

## 2014-10-24 DIAGNOSIS — Z0000 Encounter for general adult medical examination without abnormal findings: Secondary | ICD-10-CM | POA: Diagnosis not present

## 2014-10-24 DIAGNOSIS — Z7189 Other specified counseling: Secondary | ICD-10-CM | POA: Insufficient documentation

## 2014-10-24 DIAGNOSIS — Z1211 Encounter for screening for malignant neoplasm of colon: Secondary | ICD-10-CM

## 2014-10-24 NOTE — Patient Instructions (Addendum)
Stop at lab on way out for IFOB.  Work on [employer], and  regular exercise.

## 2014-10-24 NOTE — Addendum Note (Signed)
Addended by: Carter Kitten on: 10/24/2014 11:25 AM   Modules accepted: Orders

## 2014-10-24 NOTE — Progress Notes (Signed)
Pre visit review using our clinic review tool, if applicable. No additional management support is needed unless otherwise documented below in the visit note. 

## 2014-10-24 NOTE — Progress Notes (Signed)
Subjective:    Patient ID: Cynthia Griffin, female    DOB: 06-15-1946, 69 y.o.   MRN: [id number]  HPI I have personally reviewed the Medicare Annual Wellness questionnaire and have noted 1. The patient's medical and social history 2. Their use of alcohol, tobacco or illicit drugs 3. Their current medications and supplements 4. The patient's functional ability including ADL's, fall risks, home safety risks and hearing or visual             impairment. 5. Diet and physical activities 6. Evidence for depression or mood disorders The patients weight, height, BMI and visual acuity have been recorded in the chart I have made referrals, counseling and provided education to the patient based review of the above and I have provided the pt with a written personalized care plan for preventive services. She has not been seen since 04/2013  Hx of breast cancer with left mastectomy, S/P chemo and radiation.  Failed reconstruction in July 2015/  Followed by [facility].  Last CPX in 2014 Colonoscopy 2003, normal... repeat due. Last cholesterol 09/2012: tot 178, tri 83, HDL 64, LDL 97, goal < 130.  Depression:  Stable control on celexa.  Cares for her mother.  BP Readings from Last 3 Encounters:  10/24/14 110/62  06/20/14 139/65  03/31/14 134/59     Good diet, regular exercise. Lab Results  Component Value Date   CHOL 160 10/17/2014   HDL 68.20 10/17/2014   LDLCALC 74 10/17/2014   TRIG 87.0 10/17/2014   CHOLHDL 2 10/17/2014    Vit D nml.   Review of Systems  Constitutional: Negative for fever and fatigue.  HENT: Negative for ear pain.   Eyes: Negative for pain.  Respiratory: Negative for chest tightness and shortness of breath.   Cardiovascular: Negative for chest pain, palpitations and leg swelling.  Gastrointestinal: Negative for abdominal pain.  Genitourinary: Negative for dysuria.       Objective:   Physical Exam  Constitutional: Vital signs are normal. She appears  well-developed and well-nourished. She is cooperative.  Non-toxic appearance. She does not appear ill. No distress.  HENT:  Head: Normocephalic.  Right Ear: Hearing, tympanic membrane, external ear and ear canal normal.  Left Ear: Hearing, tympanic membrane, external ear and ear canal normal.  Nose: Nose normal.  Eyes: Conjunctivae, EOM and lids are normal. Pupils are equal, round, and reactive to light. Lids are everted and swept, no foreign bodies found.  Neck: Trachea normal and normal range of motion. Neck supple. Carotid bruit is not present. No thyroid mass and no thyromegaly present.  Cardiovascular: Normal rate, regular rhythm, S1 normal, S2 normal, normal heart sounds and intact distal pulses.  Exam reveals no gallop.   No murmur heard. Pulmonary/Chest: Effort normal and breath sounds normal. No respiratory distress. She has no wheezes. She has no rhonchi. She has no rales.  Abdominal: Soft. Normal appearance and bowel sounds are normal. She exhibits no distension, no fluid wave, no abdominal bruit and no mass. There is no hepatosplenomegaly. There is no tenderness. There is no rebound, no guarding and no CVA tenderness. No hernia.  Genitourinary: No breast swelling, tenderness, discharge or bleeding.  Surgical changes on breast, left  Lymphadenopathy:    She has no cervical adenopathy.    She has no axillary adenopathy.  Neurological: She is alert. She has normal strength. No cranial nerve deficit or sensory deficit.  Skin: Skin is warm, dry and intact. No rash noted.  Psychiatric: Her speech is  normal and behavior is normal. Judgment normal. Her mood appears not anxious. Cognition and memory are normal. She does not exhibit a depressed mood.          Assessment & Plan:  The patient's preventative maintenance and recommended screening tests for an annual wellness exam were reviewed in full today. Brought up to date unless services declined.  Counselled on the importance of  diet, exercise, and its role in overall health and mortality. The patient's FH and SH was reviewed, including their home life, tobacco status, and drug and alcohol status.   Vaccines: uptodate with zoster and flu,  Will do prevnar today. DEXA: osteopenia 10/2013 on ca and vit. repeat in 2 years.  Mammo: per onc Colon: 2003 repeat due would prefer ifob.  PAP/ DVE: total hysterectomy.

## 2014-10-26 DIAGNOSIS — Z853 Personal history of malignant neoplasm of breast: Secondary | ICD-10-CM | POA: Diagnosis not present

## 2014-10-26 DIAGNOSIS — Z1231 Encounter for screening mammogram for malignant neoplasm of breast: Secondary | ICD-10-CM | POA: Diagnosis not present

## 2014-10-26 NOTE — Telephone Encounter (Signed)
none

## 2014-10-29 ENCOUNTER — Other Ambulatory Visit (INDEPENDENT_AMBULATORY_CARE_PROVIDER_SITE_OTHER): Payer: Medicare Other

## 2014-10-29 DIAGNOSIS — Z1211 Encounter for screening for malignant neoplasm of colon: Secondary | ICD-10-CM

## 2014-10-29 LAB — FECAL OCCULT BLOOD, IMMUNOCHEMICAL: Fecal Occult Bld: NEGATIVE

## 2014-10-30 ENCOUNTER — Encounter: Payer: Self-pay | Admitting: Family Medicine

## 2014-11-18 ENCOUNTER — Other Ambulatory Visit: Payer: Self-pay | Admitting: Family Medicine

## 2014-11-29 ENCOUNTER — Other Ambulatory Visit: Payer: Self-pay | Admitting: *Deleted

## 2014-11-29 MED ORDER — TRAMADOL HCL 50 MG PO TABS: 50.0000 mg | ORAL_TABLET | Freq: Three times a day (TID) | ORAL | Status: DC | PRN

## 2014-11-29 NOTE — Telephone Encounter (Signed)
Rx called in to pharmacy. Hilda Blades Atalie Oros RN 11/29/14 1:45PM

## 2014-12-05 DIAGNOSIS — F209 Schizophrenia, unspecified: Secondary | ICD-10-CM | POA: Diagnosis not present

## 2015-01-02 ENCOUNTER — Other Ambulatory Visit: Payer: Self-pay | Admitting: *Deleted

## 2015-01-02 MED ORDER — TRAMADOL HCL 50 MG PO TABS: 50.0000 mg | ORAL_TABLET | Freq: Three times a day (TID) | ORAL | Status: DC | PRN

## 2015-01-03 NOTE — Telephone Encounter (Signed)
Rx called in to pharmacy and daughter  Aware.

## 2015-01-06 ENCOUNTER — Other Ambulatory Visit: Payer: Self-pay | Admitting: Internal Medicine

## 2015-01-09 IMAGING — MG MM DIGITAL DIAGNOSTIC BILAT
5 series · 5 of 5 positions shown · non-contrast
Comparison: [DATE] [DATE], [DATE], [DATE] [DATE], [DATE], [DATE] [DATE], [DATE]

CLINICAL DATA: Personal history of left breast cancer status post 
lumpectomy 4009 

DIGITAL DIAGNOSTIC BILATERAL MAMMOGRAM WITH CAD

[R CC]
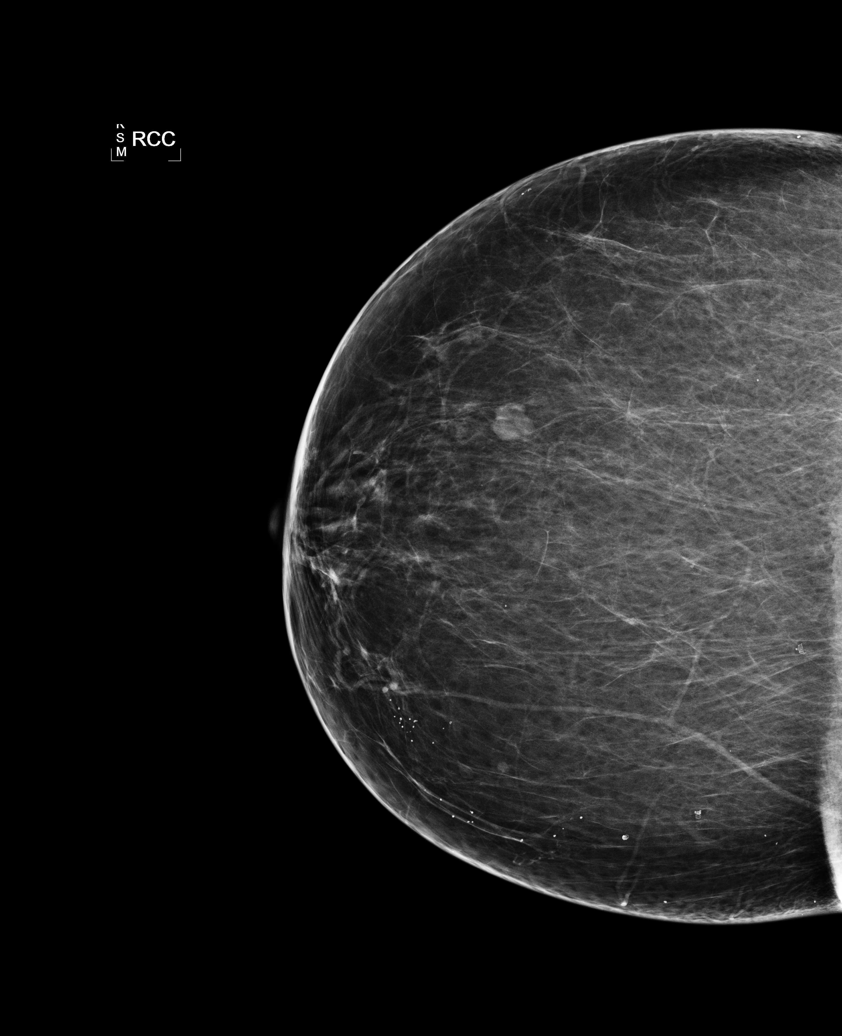

[L CC]
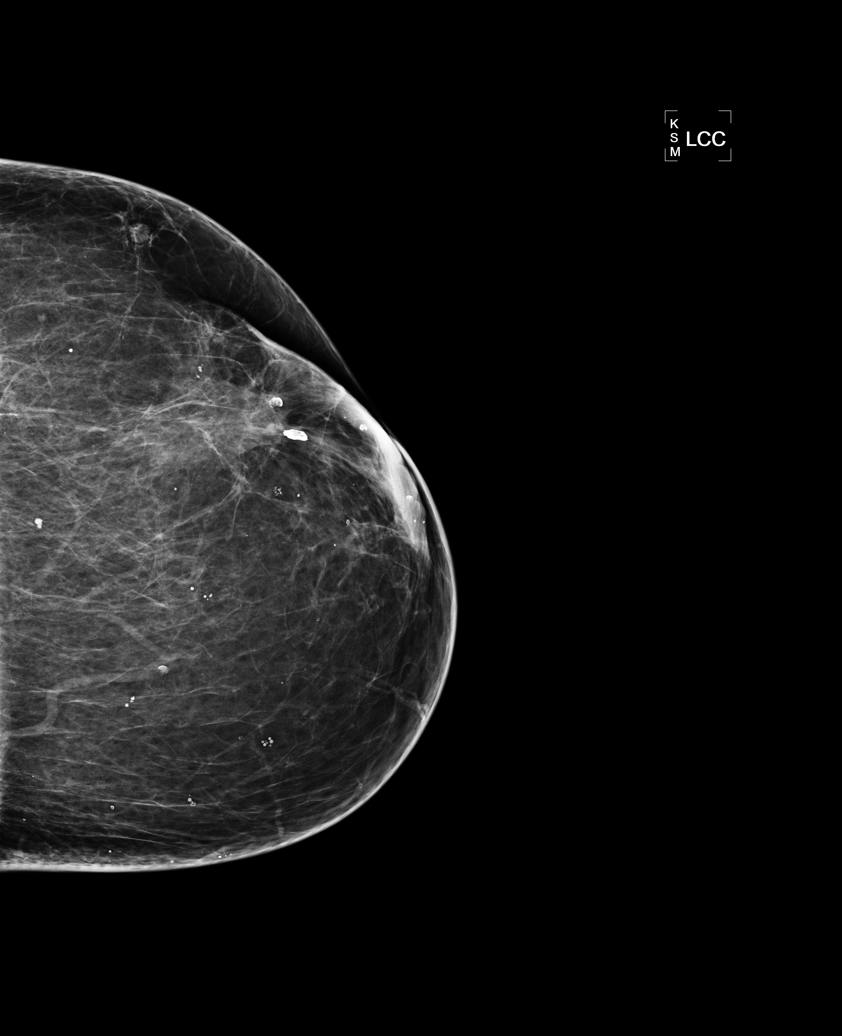

[L MLO]
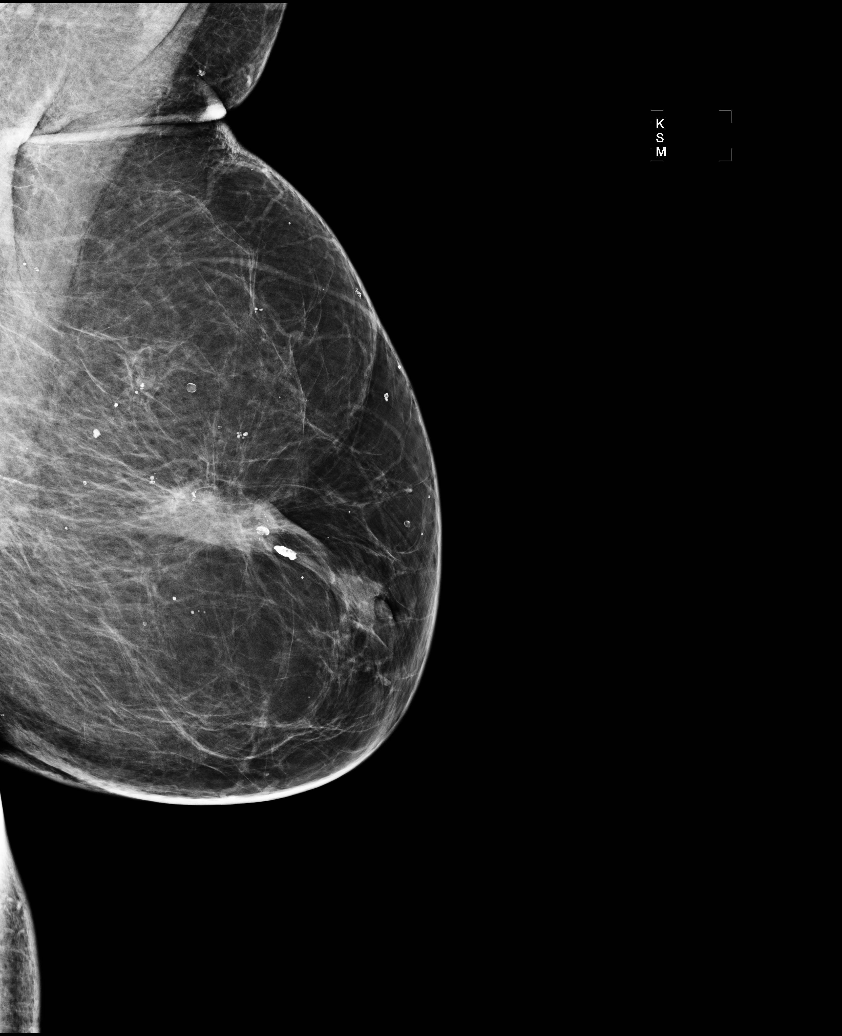

[R MLO]
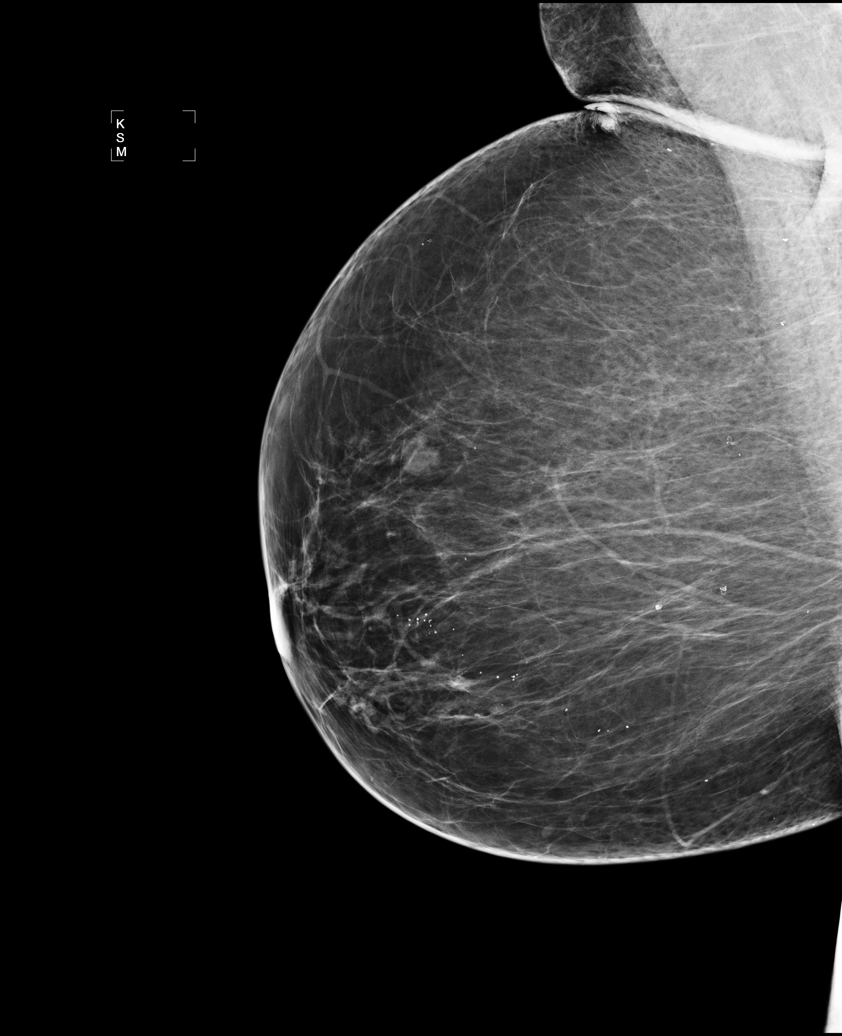

[L TAN]
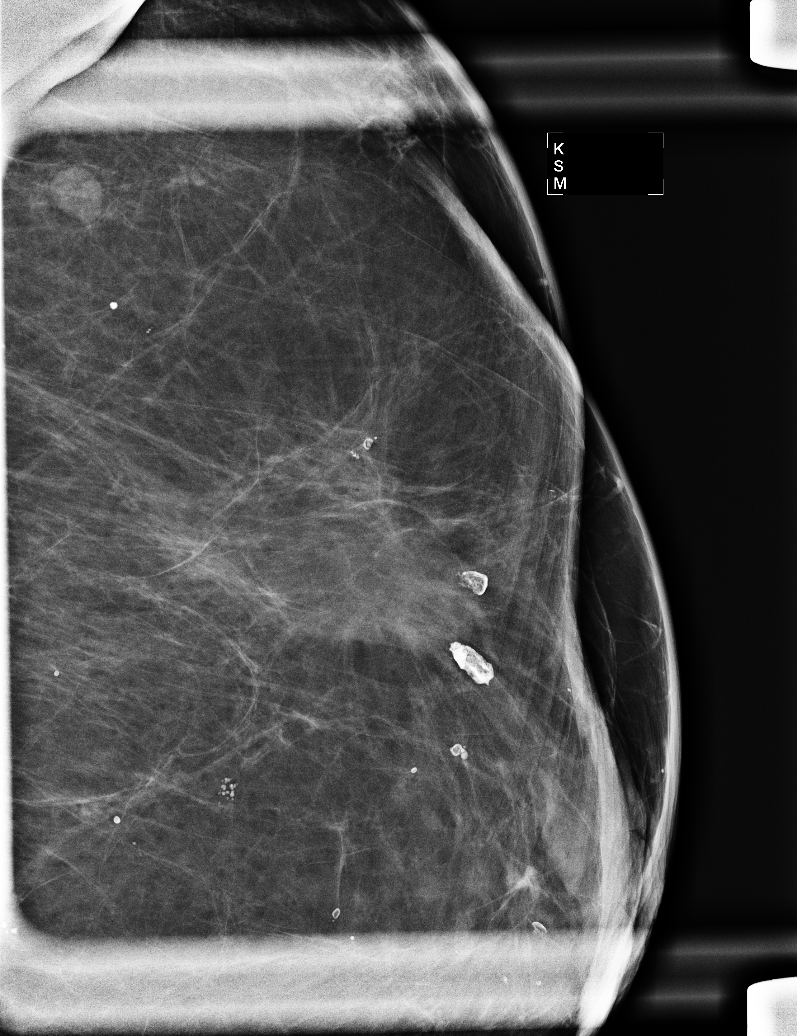

[5 of 5 positions shown; findings below may reference images not displayed]

FINDINGS: ACR Breast Density Category scattered fibroglandular tissue 

CC and MLO views of bilateral breasts, spot tangential view of left 
breast are submitted.  Postsurgical scar is identified within the 
left breast.  Stable asymmetry is identified within the right 
breast unchanged. 

Mammographic images were processed with CAD.
IMPRESSION: Benign findings. 

RECOMMENDATION: 
Routine screening mammogram in 1 year. 

I have discussed the findings and recommendations with the patient. 
Results were also provided in writing at the conclusion of the 
visit.  If applicable, a reminder letter will be sent to the 
patient regarding her next appointment. 

BI-RADS CATEGORY 2:  Benign finding(s).

## 2015-01-10 ENCOUNTER — Other Ambulatory Visit (HOSPITAL_BASED_OUTPATIENT_CLINIC_OR_DEPARTMENT_OTHER): Payer: Medicare Other

## 2015-01-10 ENCOUNTER — Ambulatory Visit (HOSPITAL_BASED_OUTPATIENT_CLINIC_OR_DEPARTMENT_OTHER): Payer: Medicare Other | Admitting: Oncology

## 2015-01-10 ENCOUNTER — Telehealth: Payer: Self-pay | Admitting: Oncology

## 2015-01-10 VITALS — BP 142/66 | HR 64 | Temp 98.2°F | Resp 18 | Ht 64.25 in | Wt 157.4 lb

## 2015-01-10 DIAGNOSIS — M8580 Other specified disorders of bone density and structure, unspecified site: Secondary | ICD-10-CM | POA: Diagnosis not present

## 2015-01-10 DIAGNOSIS — Z170 Estrogen receptor positive status [ER+]: Secondary | ICD-10-CM

## 2015-01-10 DIAGNOSIS — Z923 Personal history of irradiation: Secondary | ICD-10-CM | POA: Diagnosis not present

## 2015-01-10 DIAGNOSIS — C50112 Malignant neoplasm of central portion of left female breast: Secondary | ICD-10-CM | POA: Diagnosis not present

## 2015-01-10 DIAGNOSIS — C50319 Malignant neoplasm of lower-inner quadrant of unspecified female breast: Secondary | ICD-10-CM

## 2015-01-10 DIAGNOSIS — Z79810 Long term (current) use of selective estrogen receptor modulators (SERMs): Secondary | ICD-10-CM

## 2015-01-10 DIAGNOSIS — E559 Vitamin D deficiency, unspecified: Secondary | ICD-10-CM

## 2015-01-10 DIAGNOSIS — C50919 Malignant neoplasm of unspecified site of unspecified female breast: Secondary | ICD-10-CM

## 2015-01-10 DIAGNOSIS — Z9012 Acquired absence of left breast and nipple: Secondary | ICD-10-CM | POA: Diagnosis not present

## 2015-01-10 DIAGNOSIS — Z9221 Personal history of antineoplastic chemotherapy: Secondary | ICD-10-CM | POA: Diagnosis not present

## 2015-01-10 DIAGNOSIS — I890 Lymphedema, not elsewhere classified: Secondary | ICD-10-CM | POA: Diagnosis not present

## 2015-01-10 LAB — CBC WITH DIFFERENTIAL/PLATELET
BASO%: 0.2 % (ref 0.0–2.0)
Basophils Absolute: 0 10*3/uL (ref 0.0–0.1)
EOS%: 1.9 % (ref 0.0–7.0)
Eosinophils Absolute: 0.1 10*3/uL (ref 0.0–0.5)
HCT: 36.8 % (ref 34.8–46.6)
HGB: 11.7 g/dL (ref 11.6–15.9)
LYMPH%: 31.4 % (ref 14.0–49.7)
MCH: 28.7 pg (ref 25.1–34.0)
MCHC: 31.8 g/dL (ref 31.5–36.0)
MCV: 90.4 fL (ref 79.5–101.0)
MONO#: 0.4 10*3/uL (ref 0.1–0.9)
MONO%: 6.5 % (ref 0.0–14.0)
NEUT%: 60 % (ref 38.4–76.8)
NEUTROS ABS: 3.4 10*3/uL (ref 1.5–6.5)
Platelets: 220 10*3/uL (ref 145–400)
RBC: 4.07 10*6/uL (ref 3.70–5.45)
RDW: 13.4 % (ref 11.2–14.5)
WBC: 5.7 10*3/uL (ref 3.9–10.3)
lymph#: 1.8 10*3/uL (ref 0.9–3.3)

## 2015-01-10 LAB — COMPREHENSIVE METABOLIC PANEL (CC13)
ALT: 15 U/L (ref 0–55)
ANION GAP: 9 meq/L (ref 3–11)
AST: 14 U/L (ref 5–34)
Albumin: 3.5 g/dL (ref 3.5–5.0)
Alkaline Phosphatase: 45 U/L (ref 40–150)
BILIRUBIN TOTAL: 0.31 mg/dL (ref 0.20–1.20)
BUN: 12.4 mg/dL (ref 7.0–26.0)
CHLORIDE: 107 meq/L (ref 98–109)
CO2: 27 mEq/L (ref 22–29)
CREATININE: 0.9 mg/dL (ref 0.6–1.1)
Calcium: 8.9 mg/dL (ref 8.4–10.4)
EGFR: 71 mL/min/{1.73_m2} — AB (ref >90)
Glucose: 99 mg/dl (ref 70–140)
Potassium: 4.2 mEq/L (ref 3.5–5.1)
SODIUM: 144 meq/L (ref 136–145)
Total Protein: 6.3 g/dL — ABNORMAL LOW (ref 6.4–8.3)

## 2015-01-10 MED ORDER — TAMOXIFEN CITRATE 20 MG PO TABS: 20.0000 mg | ORAL_TABLET | Freq: Every day | ORAL | Status: DC

## 2015-01-10 NOTE — Progress Notes (Signed)
ID: Mahi Zabriskie   DOB: [birth year]  MR#: [id number]  ZJQ#:[id number]  PCP: Eliezer Lofts, MD GYN:  SU: Stark Klein OTHER MD: Kyung Rudd, Johnnette Gourd, Crissie Reese  ALERT: Another patient with the same date of birth, middle initial, and also with a history of breast cancer is in EPIC. Please make sure when dealing with Cynthia Griffin that you have the correct medical record number   CHIEF COMPLAINT:  Estrogen receptor positive breast cancer  CURRENT TREATMENT: Tamoxifen  HISTORY OF PRESENT ILLNESS: From the original intake note:  The patient tells me she had an unremarkable screening mammogram January of 2013 in [place]. I do not have that report. Shortly after that, February of 2013, she noted that her left nipple had become inverted. She initially thought this was due to the mammogram itself, but eventually brought it to her primary care physician's attention. This was followed and there was no apparent change when she was next seen December 2013. The patient however became concerned and decided to go back to [facility] for her annual mammography This is where she had her annual mammograms until the last couple of years. Bilateral diagnostic mammography and left ultrasonography there 10/24/2012 showed definite nipple deformity with inversion and retraction. There was subtle architectural distortion and increase in density relative to the preceding study. Left breast ultrasonography in addition to the nipple retraction and inversion showed an area of hyperechoic architectural distortion measuring 1.4 cm in diameter with Doppler flow. Breast specific gamma imaging was obtained February 2024 and confirmed an area of moderate intensity isotope activity following a radial distribution from the left nipple measuring at least 4 cm. 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  Bilateral breast MRI was obtained 10/31/2012 and showed an area of diffuse non-masslike asymmetric enhancement in the inferior aspect of the left breast measuring up to 7.4 cm transversely. There were no enlarged axillary or internal mammary nodes noted and the right breast was unremarkable.   The patient's subsequent history is as detailed below.  INTERVAL HISTORY: Cynthia Griffin returns today for followup of her breast cancer.  She is doing "great". She feels her stamina is back "100%". She continues on tamoxifen, with excellent tolerance. She has occasional hot flashes which are not a major issue. For exercise she walks 20-30 minutes for 5 days a week.  REVIEW OF SYSTEMS:  Dann Is the primary caregiver for her mother, who is in her late 52s and living in [place].  She volunteers for [facility] " which is a group that provides weeklong plication's for breast cancer patients and their families, all expenses  Paid.  A detailed review of systems today was otherwise noncontributory.  PAST MEDICAL HISTORY: Past Medical History  Diagnosis Date  . Arthritis   . Anxiety   . Carpal tunnel syndrome of right wrist   . Family history of anesthesia complication     pneumothorax - post op 1980's  . Breast cancer 11/14/12    left, ER +, PR -, HER 2 -  . Pneumothorax on right March, 2014    while having port-a-cath placed  . Pneumonia   . 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 Hx of radiation therapy 05/09/13-06/22/13    left breast 60.4Gy  .  Anemia     hx    PAST SURGICAL HISTORY: Past Surgical History  Procedure Laterality Date  . Appendectomy    . Tonsillectomy    . Meniscus repair      Right Knee  . Mastectomy w/ sentinel node biopsy Left 11/14/2012    Procedure: MASTECTOMY WITH SENTINEL LYMPH NODE BIOPSY;  Surgeon: Stark Klein, MD;  Location: [facility];  Service: General;  Laterality: Left;   . Axillary lymph node dissection Left 12/01/2012    Procedure: AXILLARY LYMPH NODE DISSECTION;  Surgeon: Stark Klein, MD;  Location: [facility];  Service: General;  Laterality: Left;  . Portacath placement Right 12/01/2012    Procedure: INSERTION PORT-A-CATH;  Surgeon: Stark Klein, MD;  Location: [facility];  Service: General;  Laterality: Right;  . Abdominal hysterectomy  1992    fibroids, total  . Mastectomy Left December 20, 2012  . Eye surgery Bilateral 2006    lasik surgery  . Port-a-cath removal N/A 05/02/2013    Procedure: REMOVAL PORT-A-CATH;  Surgeon: Stark Klein, MD;  Location: [facility];  Service: General;  Laterality: N/A;  . Shoulder arthroscopy Right 1/15    rotator cuff repair  . Latissimus flap to breast Left 03/29/2014    dr Harlow Mares  . Latissimus flap to breast Left 03/29/2014    Procedure: LEFT LATISSIMUS FLAP;  Surgeon: Crissie Reese, MD;  Location: [place] [facility];  Service: Plastics;  Laterality: Left;   right shoulder rotator cuff repair, right hand carpal tunnel syndrome, bilateral salpingo--oophorectomy with her hysterectomy in the 1990s, Lasix surgery, right toe surgery  FAMILY HISTORY Family History  Problem Relation Age of Onset  . Bladder Cancer Father 30  . Lung cancer Father 61  . Diabetes Father   . Bone cancer Paternal Uncle     ? late 68s  . Diabetes Mother   . Peripheral vascular disease Mother   . Multiple sclerosis Sister   . Parkinson's disease Paternal Grandmother   . Heart disease Paternal Grandfather    the patient's father died at the age of 87. He was diagnosed with lung cancer the age of 50 and with bladder cancer at the age of 48. He was a smoker. The patient's mother is living at age 50. She has a history of basal cell carcinoma. Cynthia Griffin had no brothers, one sister. There is no history of breast or ovarian cancer in the immediate family.  GYNECOLOGIC HISTORY: Menarche age 40, first live birth age 54, she is [place] P2. She had her  hysterectomy she thinks in 1992, and took hormone replacement for approximately 18 years. In addition she took birth control pills between 1968 and 1977 stopping only to conceive.  SOCIAL HISTORY: She is a retired Web designer. Her husband Cynthia Griffin (goes by Cynthia Griffin) is in Press photographer for at [employer] in [place]. Son Cynthia Griffin lives in [place] and works in Pharmacologist. Son Cynthia Griffin lives in [place] and works in Press photographer. The patient has 5 grandchildren" one on the way".   ADVANCED DIRECTIVES: In place  HEALTH MAINTENANCE:  (Updated 09/04/2013) History  Substance Use Topics  . Smoking status: Former Smoker -- 1.25 packs/day for 29 years    Types: Cigarettes    Quit date: 09/07/1992  . Smokeless tobacco: Never Used  . Alcohol Use: 4.2 oz/week    7 Glasses of wine per week     Colonoscopy: August 2003  PAP: Status post hysterectomy  Bone density: Never - scheduled for  Feb 2015  Lipid panel: Dr. Diona Browner   Allergies  Allergen Reactions  . Sulfa Antibiotics Nausea Only and Other (See Comments)    Stomach cramps    Current Outpatient Prescriptions  Medication Sig Dispense Refill  . Biotin 1000 MCG tablet Take 1,000 mcg by mouth daily.    . calcium citrate-vitamin D (CITRACAL+D) 315-200 MG-UNIT per tablet Take 1 tablet by mouth 2 (two) times daily.     . citalopram (CELEXA) 40 MG tablet TAKE 1 TABLET BY MOUTH ONCE A DAY 90 tablet 1  . Multiple Vitamin (MULTIVITAMIN WITH MINERALS) TABS Take 1 tablet by mouth daily.    . tamoxifen (NOLVADEX) 20 MG tablet Take 1 tablet (20 mg total) by mouth daily. 90 tablet 3   No current facility-administered medications for this visit.    OBJECTIVE: Middle-aged white woman who appears well  Filed Vitals:   01/10/15 1213  BP: 142/66  Pulse: 64  Temp: 98.2 F (36.8 C)  Resp: 18     Body mass index is 26.81 kg/(m^2).    ECOG FS: 0  Filed Weights   01/10/15 1213  Weight: 157 lb 6.4 oz  (71.396 kg)   Sclerae unicteric,  EOMs intact Oropharynx clear and moist No cervical or supraclavicular adenopathy Lungs no rales or rhonchi Heart regular rate and rhythm Abd soft, nontender, positive bowel sounds MSK no focal spinal tenderness, no upper extremity lymphedema Neuro: nonfocal, well oriented, appropriate affect Breasts: Right breast is unremarkable. The left breast is status post mastectomy and radiation. There is no evidence of  local recurrence. The left axilla is benign.    LAB RESULTS: Lab Results  Component Value Date   WBC 5.7 01/10/2015   NEUTROABS 3.4 01/10/2015   HGB 11.7 01/10/2015   HCT 36.8 01/10/2015   MCV 90.4 01/10/2015   PLT 220 01/10/2015   CMP     Component Value Date/Time   NA 144 01/10/2015 1132   NA 141 10/17/2014 0830   K 4.2 01/10/2015 1132   K 4.0 10/17/2014 0830   CL 106 10/17/2014 0830   CL 107 02/21/2013 1028   CO2 27 01/10/2015 1132   CO2 30 10/17/2014 0830   GLUCOSE 99 01/10/2015 1132   GLUCOSE 94 10/17/2014 0830   GLUCOSE 97 02/21/2013 1028   BUN 12.4 01/10/2015 1132   BUN 15 10/17/2014 0830   CREATININE 0.9 01/10/2015 1132   CREATININE 0.90 10/17/2014 0830   CALCIUM 8.9 01/10/2015 1132   CALCIUM 9.6 10/17/2014 0830   PROT 6.3* 01/10/2015 1132   PROT 6.7 10/17/2014 0830   ALBUMIN 3.5 01/10/2015 1132   ALBUMIN 4.0 10/17/2014 0830   AST 14 01/10/2015 1132   AST 17 10/17/2014 0830   ALT 15 01/10/2015 1132   ALT 15 10/17/2014 0830   ALKPHOS 45 01/10/2015 1132   ALKPHOS 40 10/17/2014 0830   BILITOT 0.31 01/10/2015 1132   BILITOT 0.4 10/17/2014 0830   GFRNONAA 73* 03/21/2014 1330   GFRAA 85* 03/21/2014 1330    STUDIES:  right mammography with tomosynthesis at [facility] 10/30/2014 showed no evidence of malignancy.  ASSESSMENT: 69 y.o. [place] woman   (1)  status post left mastectomy and axillary lymph node dissection March 2014 for a pT3 pN2, stage IIIA invasive lobular carcinoma, grade 1, estrogen receptor 100%  positive, progesterone receptor and HER-2 negative, with an MIB-1 of 8%.  (2) R pneumothorax with port placement March 2014- resolved  (3)  treated in the adjuvant setting with docetaxel/ doxorubicin/ cyclophosphamide, the original  plan being to complete 6 q. three-week doses, with Neulasta given on day 2 for granulocyte support  (4) switched to carboplatin and gemcitabine given day 1 only for cycles 5 and 6 of chemotherapy because of concerns regarding developing neuropathy; completed 04/04/2013  (5)  lymphedema, left upper extremity, grade 1  (6) received postmastectomy radiation, completed mid October  (7)  started on tamoxifen, 20 mg daily, in late October 2014  (8) underwent a left latissimus flap reconstruction 03/29/2014, unfortunately with flap necrosis. The patient does not intend further reconstruction.  (9)  osteopenia with a T score of -2.0 February 2015   PLAN:  Lynise  Continues to tolerate tamoxifen without any significant side effects. She will complete 2 years when she next sees me this fall. At that time she will have the option if she wishes of switching to an aromatase inhibitor so she can complete her treatment at 5 years. On the other hand if she may wish to continue on tamoxifen and plan on a 10 year antiestrogen course.   the work she is doing for the [facility] group is wonderful and if we have any appropriate patients we will be able to refer them through her.   she knows to call for any problems that may develop before her next visit here.  Chauncey Cruel, MD     01/10/2015

## 2015-01-10 NOTE — Telephone Encounter (Signed)
Gave avs & calendar for November.  °

## 2015-01-15 ENCOUNTER — Telehealth: Payer: Self-pay | Admitting: Internal Medicine

## 2015-01-15 NOTE — Telephone Encounter (Signed)
Call to patient to confirm appointment for 01/16/15 at 2:15 lmtcb

## 2015-01-16 ENCOUNTER — Encounter: Payer: Self-pay | Admitting: Internal Medicine

## 2015-02-12 DIAGNOSIS — F209 Schizophrenia, unspecified: Secondary | ICD-10-CM | POA: Diagnosis not present

## 2015-02-13 ENCOUNTER — Other Ambulatory Visit: Payer: Self-pay | Admitting: *Deleted

## 2015-02-14 MED ORDER — TRAMADOL HCL 50 MG PO TABS: 50.0000 mg | ORAL_TABLET | Freq: Three times a day (TID) | ORAL | Status: DC | PRN

## 2015-02-14 NOTE — Telephone Encounter (Signed)
Called to pharm 

## 2015-02-20 ENCOUNTER — Encounter: Payer: Self-pay | Admitting: Internal Medicine

## 2015-02-20 ENCOUNTER — Ambulatory Visit (INDEPENDENT_AMBULATORY_CARE_PROVIDER_SITE_OTHER): Payer: Medicare Other | Admitting: Internal Medicine

## 2015-02-20 VITALS — BP 163/69 | HR 62 | Temp 98.2°F | Wt 203.8 lb

## 2015-02-20 DIAGNOSIS — K219 Gastro-esophageal reflux disease without esophagitis: Secondary | ICD-10-CM

## 2015-02-20 DIAGNOSIS — IMO0002 Reserved for concepts with insufficient information to code with codable children: Secondary | ICD-10-CM

## 2015-02-20 DIAGNOSIS — E1165 Type 2 diabetes mellitus with hyperglycemia: Secondary | ICD-10-CM | POA: Diagnosis not present

## 2015-02-20 DIAGNOSIS — I10 Essential (primary) hypertension: Secondary | ICD-10-CM | POA: Diagnosis not present

## 2015-02-20 DIAGNOSIS — Z7982 Long term (current) use of aspirin: Secondary | ICD-10-CM | POA: Diagnosis not present

## 2015-02-20 DIAGNOSIS — E785 Hyperlipidemia, unspecified: Secondary | ICD-10-CM

## 2015-02-20 DIAGNOSIS — I25118 Atherosclerotic heart disease of native coronary artery with other forms of angina pectoris: Secondary | ICD-10-CM | POA: Diagnosis not present

## 2015-02-20 DIAGNOSIS — Z794 Long term (current) use of insulin: Secondary | ICD-10-CM | POA: Diagnosis not present

## 2015-02-20 DIAGNOSIS — E1169 Type 2 diabetes mellitus with other specified complication: Secondary | ICD-10-CM

## 2015-02-20 DIAGNOSIS — Z0000 Encounter for general adult medical examination without abnormal findings: Secondary | ICD-10-CM

## 2015-02-20 DIAGNOSIS — I25110 Atherosclerotic heart disease of native coronary artery with unstable angina pectoris: Secondary | ICD-10-CM

## 2015-02-20 LAB — GLUCOSE, CAPILLARY: GLUCOSE-CAPILLARY: 73 mg/dL (ref 65–99)

## 2015-02-20 LAB — POCT GLYCOSYLATED HEMOGLOBIN (HGB A1C): HEMOGLOBIN A1C: 7.8

## 2015-02-20 MED ORDER — LISINOPRIL 10 MG PO TABS: 10.0000 mg | ORAL_TABLET | Freq: Every day | ORAL | Status: DC

## 2015-02-20 MED ORDER — METOPROLOL TARTRATE 50 MG PO TABS: 100.0000 mg | ORAL_TABLET | Freq: Two times a day (BID) | ORAL | Status: DC

## 2015-02-20 MED ORDER — INSULIN DETEMIR 100 UNIT/ML FLEXPEN: 33.0000 [IU] | PEN_INJECTOR | Freq: Every day | SUBCUTANEOUS | Status: DC

## 2015-02-20 MED ORDER — METFORMIN HCL 1000 MG PO TABS: 1000.0000 mg | ORAL_TABLET | Freq: Two times a day (BID) | ORAL | Status: DC

## 2015-02-20 MED ORDER — RANITIDINE HCL 150 MG PO TABS: 150.0000 mg | ORAL_TABLET | Freq: Two times a day (BID) | ORAL | Status: DC

## 2015-02-20 MED ORDER — PRAVASTATIN SODIUM 40 MG PO TABS: 40.0000 mg | ORAL_TABLET | Freq: Every day | ORAL | Status: DC

## 2015-02-20 NOTE — Assessment & Plan Note (Signed)
Chronic atypical stable angina unchanged from her baseline over the years. Pain is not worse with exertion, comes on randomly. No associated nausea, vomiting, diaphoresis. Controlled with tramadol. Pain reproducible with palpations. Previously thought to be secondary to stable angina versus sarcoidosis. Patient and daughter are aware to go to the ED if episodes of chest pain change or worsen.  Plan: Continue ASA 81 mg daily Increase lisinopril to 10 mg daily Continue Metoprolol 100 mg BID Continue pravastatin 40 mg daily Continue tramadol 50 mg Q8H prn

## 2015-02-20 NOTE — Assessment & Plan Note (Signed)
Lab Results  Component Value Date   HGBA1C 7.8 02/20/2015   HGBA1C 7.6 10/12/2014   HGBA1C 7.3 06/20/2014     Assessment: Diabetes control:  Uncontrolled Progress toward A1C goal:   Deteriorated Comments: Patient takes Metformin 1000 mg BID and levemir 33 units QHS. However, daughter states patients glucose has been lower before bed in the 70s-100s- possibly due to decreased sweets intake?; therefore she will hold her levemir those nights.   Plan: Medications:  continue current medications Home glucose monitoring: Frequency:  Increase frequency to have a range of glucose readings Timing:   QAM, pre-prandial and post-prandial, QHS Instruction/counseling given: reminded to bring blood glucose meter & log to each visit Other plans: It is unclear why hgba1c has gone up despite nighttime readings in the 70-100s. Possibly due to levemir being held some nights and then patient being hyperglycemic the next day. However, we do not have any different readings in the morning or in the daytime to determine this. Continue current dose, check more frequently, call in 2 weeks and determine how glucose is controlled throughout the day. Levemir can be adjusted based on this.

## 2015-02-20 NOTE — Assessment & Plan Note (Signed)
BP Readings from Last 3 Encounters:  02/20/15 163/69  10/12/14 148/76  06/20/14 134/66    Lab Results  Component Value Date   NA 141 10/12/2014   K 4.1 10/12/2014   CREATININE 0.61 10/12/2014    Assessment: Blood pressure control:  Elevated Progress toward BP goal:   Deteriorated Comments: Compliant with lisinopril 5 mg daily and metoprolol 100 mg BID.   Plan: Medications:  Increase Lisinopril to 10 mg daily. Continue Metoprolol 100 mg BID.  Self management tools provided:  Continue to check BP at home with monitor

## 2015-02-20 NOTE — Patient Instructions (Signed)
General Instructions:   Please bring your medicines with you each time you come to clinic.  Medicines may include prescription medications, over-the-counter medications, herbal remedies, eye drops, vitamins, or other pills.   TAKE LISINOPRIL 10 MG DAILY FOR BLOOD PRESSURE. TAKE ALL OTHER MEDICATIONS THE SAME.  PLEASE COMPLETE THE STOOL CARDS AND MAIL BACK IN.  PLEASE SCHEDULE YOUR REPEAT MAMMOGRAM.  CHECK BLOOD SUGAR IN THE MORNING BEFORE BREAKFAST AND TRY TO CHECK POST-MEAL BLOOD SUGARS AS WELL WHEN ABLE. I WILL CALL IN 2 WEEKS TO GET AN IDEA OF HOW YOUR SUGAR IS CONTROLLED.  FOLLOW UP IN 3 MONTHS.

## 2015-02-20 NOTE — Assessment & Plan Note (Signed)
Controlled with ranitidine.  Plan: Refilled ranitidine 150 mg BID.

## 2015-02-20 NOTE — Assessment & Plan Note (Signed)
Patient's daughter assures me she will reschedule her mammogram. Patient has stool cards at home- states she will use them and mail them in.  Address Pap and PNA vaccine in 3 months.

## 2015-02-20 NOTE — Progress Notes (Signed)
Subjective:    Patient ID: [name], female    DOB: 07/05/1946, 69 y.o.   MRN: [id number]  HPI Ms. Capistran is a 69 yo female with PMHx of CAD, HTN, GERD, HLD, T2DM, and h/o breast cancer and schizophrenia who presents for follow up for T2DM. Please see problem based assessment and plan for more information.  Review of Systems General: Denies fever, chills, fatigue Respiratory: Denies SOB, cough, DOE   Cardiovascular: Admits to chronic intermittent chest pain, Denies palpitations.  Gastrointestinal: Denies nausea, vomiting, abdominal pain, diarrhea, constipation, melena, hematochezia. . Musculoskeletal: Denies myalgias, back pain, joint swelling Skin: Denies pallor, rash and wounds.  Neurological: Denies dizziness, headaches, weakness, lightheadedness  Past Medical History  Diagnosis Date  . Diabetes mellitus   . GERD (gastroesophageal reflux disease)   . Hyperlipidemia   . Hypertension   . Schizophrenia   . Atypical nevus   . History of mitral valve prolapse   . Breast cancer 07/14/06    Left breast. Ductal carcinoma, grade 3. Followed by Dr. Truddie Coco. s/p lumpectomy with radiation  . Goiter     U/S 08/2009-persistent multiple small nodules, one in the right lower pole slightly enlarged, recommend continued followup.  . Ovarian cyst   . Fibroids   . DVT (deep vein thrombosis) in pregnancy 1982    Affected the RLE. Required coumadin.  . Dental caries    Outpatient Encounter Prescriptions as of 02/20/2015  Medication Sig  . aspirin 81 MG tablet Take 1 tablet (81 mg total) by mouth daily.  . calcium-vitamin D (OSCAL 500/200 D-3) 500-200 MG-UNIT per tablet Take 1 tablet by mouth 3 (three) times daily.  . Insulin Detemir (LEVEMIR FLEXPEN) 100 UNIT/ML Pen Inject 33 Units into the skin daily at 10 pm.  . lisinopril (PRINIVIL,ZESTRIL) 5 MG tablet Take 1 tablet (5 mg total) by mouth daily.  . metFORMIN (GLUCOPHAGE) 1000 MG tablet Take 1 tablet (1,000 mg total) by mouth 2 (two) times  daily with a meal.  . metoprolol (LOPRESSOR) 50 MG tablet TAKE TWO TABLETS BY MOUTH TWICE DAILY  . OLANZapine (ZYPREXA) 10 MG tablet Take 30 mg by mouth at bedtime.   . polyethylene glycol (MIRALAX / GLYCOLAX) packet Take 17 g by mouth daily.  . pravastatin (PRAVACHOL) 40 MG tablet TAKE ONE TABLET BY MOUTH ONCE DAILY  . QUEtiapine (SEROQUEL) 50 MG tablet Take 150 mg by mouth at bedtime.  . ranitidine (ZANTAC) 150 MG tablet TAKE ONE TABLET BY MOUTH TWICE DAILY  . traMADol (ULTRAM) 50 MG tablet Take 1 tablet (50 mg total) by mouth every 8 (eight) hours as needed for severe pain.   No facility-administered encounter medications on file as of 02/20/2015.      Objective:   Physical Exam Filed Vitals:   02/20/15 1534  BP: 179/84  Pulse: 62  Temp: 98.2 F (36.8 C)  TempSrc: Oral  Weight: 203 lb 12.8 oz (92.443 kg)  SpO2: 100%   General: Vital signs reviewed.  Patient is well-developed and well-nourished, in no acute distress and cooperative with exam.  Cardiovascular: RRR, 2/6 holosystolic murmur, tenderness reproducible with palpation of chest wall Pulmonary/Chest: Clear to auscultation bilaterally, no wheezes, rales, or rhonchi. Abdominal: Soft, non-tender, non-distended, BS + Extremities: No lower extremity edema bilaterally, pulses symmetric and intact bilaterally.  Skin: Warm, dry and intact. No rashes or erythema. Psychiatric: Normal mood and affect. speech and behavior is normal. Cognition and memory are normal.     Assessment & Plan:   Please  see problem oriented assessment and plan.

## 2015-02-22 NOTE — Progress Notes (Signed)
[facility] Medicine Clinic Attending  Case discussed with Dr. Richardson at the time of the visit.  We reviewed the resident's history and exam and pertinent patient test results.  I agree with the assessment, diagnosis, and plan of care documented in the resident's note. 

## 2015-03-18 DIAGNOSIS — C50512 Malignant neoplasm of lower-outer quadrant of left female breast: Secondary | ICD-10-CM | POA: Diagnosis not present

## 2015-03-26 ENCOUNTER — Other Ambulatory Visit: Payer: Self-pay | Admitting: Internal Medicine

## 2015-03-26 MED ORDER — TRAMADOL HCL 50 MG PO TABS: 50.0000 mg | ORAL_TABLET | Freq: Three times a day (TID) | ORAL | Status: DC | PRN

## 2015-03-26 NOTE — Telephone Encounter (Signed)
NA - message left for [facility] as instructed [facility] return call.

## 2015-03-26 NOTE — Telephone Encounter (Signed)
[facility] on [place] pain medication. Said to ask for [facility] when you call.

## 2015-03-28 NOTE — Telephone Encounter (Signed)
Called and confirmed at [facility]

## 2015-05-02 DIAGNOSIS — F209 Schizophrenia, unspecified: Secondary | ICD-10-CM | POA: Diagnosis not present

## 2015-05-06 ENCOUNTER — Other Ambulatory Visit: Payer: Self-pay | Admitting: Internal Medicine

## 2015-05-06 NOTE — Telephone Encounter (Signed)
Pt called requesting tramadol to be filled. °

## 2015-05-07 MED ORDER — TRAMADOL HCL 50 MG PO TABS: 50.0000 mg | ORAL_TABLET | Freq: Three times a day (TID) | ORAL | Status: DC | PRN

## 2015-05-10 ENCOUNTER — Telehealth: Payer: Self-pay | Admitting: Internal Medicine

## 2015-05-10 NOTE — Telephone Encounter (Signed)
Checked with pharmacy - Rx was not called in. Rx phoned in to pharmacy. Hilda Blades Janeann Paisley RN 05/10/15 4:05PM

## 2015-05-10 NOTE — Telephone Encounter (Signed)
Pt called back requesting tramadol to be filled.

## 2015-05-10 NOTE — Telephone Encounter (Signed)
Daughter aware Rx was called in before calling pt.

## 2015-05-12 ENCOUNTER — Other Ambulatory Visit: Payer: Self-pay | Admitting: Family Medicine

## 2015-05-27 ENCOUNTER — Ambulatory Visit (INDEPENDENT_AMBULATORY_CARE_PROVIDER_SITE_OTHER): Payer: Medicare Other | Admitting: Internal Medicine

## 2015-05-27 ENCOUNTER — Encounter: Payer: Self-pay | Admitting: Internal Medicine

## 2015-05-27 ENCOUNTER — Telehealth: Payer: Self-pay | Admitting: Dietician

## 2015-05-27 VITALS — BP 148/82 | HR 81 | Temp 98.3°F | Ht 70.0 in | Wt 208.1 lb

## 2015-05-27 DIAGNOSIS — Z0000 Encounter for general adult medical examination without abnormal findings: Secondary | ICD-10-CM

## 2015-05-27 DIAGNOSIS — Z1231 Encounter for screening mammogram for malignant neoplasm of breast: Secondary | ICD-10-CM

## 2015-05-27 DIAGNOSIS — K869 Disease of pancreas, unspecified: Secondary | ICD-10-CM

## 2015-05-27 DIAGNOSIS — Z1211 Encounter for screening for malignant neoplasm of colon: Secondary | ICD-10-CM

## 2015-05-27 DIAGNOSIS — Z794 Long term (current) use of insulin: Secondary | ICD-10-CM

## 2015-05-27 DIAGNOSIS — Z853 Personal history of malignant neoplasm of breast: Secondary | ICD-10-CM

## 2015-05-27 DIAGNOSIS — I10 Essential (primary) hypertension: Secondary | ICD-10-CM

## 2015-05-27 DIAGNOSIS — E119 Type 2 diabetes mellitus without complications: Secondary | ICD-10-CM

## 2015-05-27 DIAGNOSIS — Z79899 Other long term (current) drug therapy: Secondary | ICD-10-CM | POA: Diagnosis not present

## 2015-05-27 DIAGNOSIS — E1165 Type 2 diabetes mellitus with hyperglycemia: Secondary | ICD-10-CM

## 2015-05-27 DIAGNOSIS — C50912 Malignant neoplasm of unspecified site of left female breast: Secondary | ICD-10-CM | POA: Diagnosis not present

## 2015-05-27 DIAGNOSIS — IMO0002 Reserved for concepts with insufficient information to code with codable children: Secondary | ICD-10-CM

## 2015-05-27 LAB — POCT GLYCOSYLATED HEMOGLOBIN (HGB A1C): Hemoglobin A1C: 7.5

## 2015-05-27 LAB — GLUCOSE, CAPILLARY: Glucose-Capillary: 153 mg/dL — ABNORMAL HIGH (ref 65–99)

## 2015-05-27 NOTE — Telephone Encounter (Signed)
Per daughter's request, patient was scheduled with Dr.Bradley Bowen for October 7th at 9:15 AM. Daughter and front office informed to call for records.

## 2015-05-27 NOTE — Assessment & Plan Note (Signed)
Lab Results  Component Value Date   HGBA1C 7.5 05/27/2015   HGBA1C 7.8 02/20/2015   HGBA1C 7.6 10/12/2014     Assessment: Diabetes control:   Slightly above goal Progress toward A1C goal:   Improved Comments: Patient is on metformin 1000 mg BID, levemir 33 units QHS. Daughter tests her CBG and provides insulin. At times, daughter witholds levemir or greatly reduced dose if CBG in 90s.   Plan: Medications:  continue current medications Instruction/counseling given: reminded to bring blood glucose meter & log to each visit, reminded to bring medications to each visit, provided printed educational material and other instruction/counseling: education on continuing levemir and not witholding Educational resources provided: brochure, handout Self management tools provided:   Other plans: follow up 3 months

## 2015-05-27 NOTE — Progress Notes (Signed)
Internal Medicine Clinic Attending  Case discussed with Dr. Richardson soon after the resident saw the patient.  We reviewed the resident's history and exam and pertinent patient test results.  I agree with the assessment, diagnosis, and plan of care documented in the resident's note. 

## 2015-05-27 NOTE — Progress Notes (Signed)
Subjective:    Patient ID: [name], female    DOB: [birth year], 69 y.o.   MRN: [id number]  HPI [name] is a 69 yo female with PMHx of T2DM, HTN, CAD, and h/o breast cancer in remission who presents for follow up for HTN. Please see problem based assessment and plan for more information.  Review of Systems General: Denies fever, chills, fatigue, change in appetite and diaphoresis.  Respiratory: Denies SOB, cough, DOE  Cardiovascular: Denies chest pain and palpitations.  Gastrointestinal: Denies nausea, vomiting, abdominal pain, diarrhea, constipation, blood in stool Skin: Denies pallor, rash and wounds.  Neurological: Denies dizziness, headaches, weakness, lightheadedness  Past Medical History  Diagnosis Date  . Diabetes mellitus   . GERD (gastroesophageal reflux disease)   . Hyperlipidemia   . Hypertension   . Schizophrenia   . Atypical nevus   . History of mitral valve prolapse   . Breast cancer 07/14/06    Left breast. Ductal carcinoma, grade 3. Followed by Dr. Truddie Coco. s/p lumpectomy with radiation  . Goiter     U/S 08/2009-persistent multiple small nodules, one in the right lower pole slightly enlarged, recommend continued followup.  . Ovarian cyst   . Fibroids   . DVT (deep vein thrombosis) in pregnancy 1982    Affected the RLE. Required coumadin.  . Dental caries    Outpatient Encounter Prescriptions as of 05/27/2015  Medication Sig  . aspirin 81 MG tablet Take 1 tablet (81 mg total) by mouth daily.  . calcium-vitamin D (OSCAL 500/200 D-3) 500-200 MG-UNIT per tablet Take 1 tablet by mouth 3 (three) times daily.  . Insulin Detemir (LEVEMIR FLEXPEN) 100 UNIT/ML Pen Inject 33 Units into the skin daily at 10 pm.  . lisinopril (PRINIVIL,ZESTRIL) 10 MG tablet Take 1 tablet (10 mg total) by mouth daily.  . metFORMIN (GLUCOPHAGE) 1000 MG tablet Take 1 tablet (1,000 mg total) by mouth 2 (two) times daily with a meal.  . metoprolol (LOPRESSOR) 50 MG tablet Take 2 tablets  (100 mg total) by mouth 2 (two) times daily.  Marland Kitchen OLANZapine (ZYPREXA) 10 MG tablet Take 30 mg by mouth at bedtime.   . polyethylene glycol (MIRALAX / GLYCOLAX) packet Take 17 g by mouth daily.  . pravastatin (PRAVACHOL) 40 MG tablet Take 1 tablet (40 mg total) by mouth daily.  . QUEtiapine (SEROQUEL) 50 MG tablet Take 150 mg by mouth at bedtime.  . ranitidine (ZANTAC) 150 MG tablet Take 1 tablet (150 mg total) by mouth 2 (two) times daily.  . traMADol (ULTRAM) 50 MG tablet Take 1 tablet (50 mg total) by mouth every 8 (eight) hours as needed for severe pain. Do not fill until 30 days after last fill   No facility-administered encounter medications on file as of 05/27/2015.      Objective:   Physical Exam Filed Vitals:   05/27/15 0927  BP: 148/82  Pulse: 81  Temp: 98.3 F (36.8 C)  TempSrc: Oral  Height: 5\' 10"  (1.778 m)  Weight: 208 lb 1.6 oz (94.394 kg)  SpO2: 99%   General: Vital signs reviewed.  Patient is well-developed and well-nourished, in no acute distress and cooperative with exam.  Cardiovascular: RRR, S1 normal, S2 normal, no murmurs, gallops, or rubs. Pulmonary/Chest: Clear to auscultation bilaterally, no wheezes, rales, or rhonchi. Abdominal: Soft, non-tender, non-distended, BS + Extremities: No lower extremity edema bilaterally, pulses symmetric and intact bilaterally. No cyanosis or clubbing. Skin: Warm, dry and intact. No rashes or erythema.  Assessment & Plan:   Please see problem oriented assessment and plan.

## 2015-05-27 NOTE — Assessment & Plan Note (Signed)
Referral placed for screening mammogram 

## 2015-05-27 NOTE — Assessment & Plan Note (Signed)
BP Readings from Last 3 Encounters:  05/27/15 148/82  02/20/15 163/69  10/12/14 148/76    Lab Results  Component Value Date   NA 141 10/12/2014   K 4.1 10/12/2014   CREATININE 0.61 10/12/2014    Assessment: Blood pressure control:  Controlled Progress toward BP goal:   At goal Comments: compliant with lisinopril 10 mg daily and metoprolol 100 mg BID  Plan: Medications:  continue current medications Educational resources provided: brochure, handout, video

## 2015-05-27 NOTE — Assessment & Plan Note (Signed)
Patient refused flu shot and pna vaccine. Provided stool cards as patient refuses colonoscopy. Referral for mammogram placed.

## 2015-05-27 NOTE — Assessment & Plan Note (Signed)
Order placed for repeat MRI abdomen to assess stability of pancreatic lesions.

## 2015-05-27 NOTE — Patient Instructions (Signed)
CONTINUE ALL MEDICATIONS THE SAME.  REFERRAL HAS BEEN PLACED FOR MAMMOGRAM.  MRI HAS BEEN ORDERED FOR PANCREAS.  PLEASE COMPLETE STOOL CARDS AND TURN IN WHEN AVAILABLE.

## 2015-06-03 ENCOUNTER — Other Ambulatory Visit: Payer: Self-pay | Admitting: Internal Medicine

## 2015-06-03 ENCOUNTER — Ambulatory Visit (HOSPITAL_COMMUNITY)
Admission: RE | Admit: 2015-06-03 | Discharge: 2015-06-03 | Disposition: A | Discharge status: Home / Self Care | Payer: Medicare Other | Source: Ambulatory Visit | Attending: Internal Medicine | Admitting: Internal Medicine

## 2015-06-03 DIAGNOSIS — K862 Cyst of pancreas: Secondary | ICD-10-CM | POA: Diagnosis not present

## 2015-06-03 DIAGNOSIS — J90 Pleural effusion, not elsewhere classified: Secondary | ICD-10-CM | POA: Insufficient documentation

## 2015-06-03 DIAGNOSIS — K869 Disease of pancreas, unspecified: Secondary | ICD-10-CM

## 2015-06-03 DIAGNOSIS — K760 Fatty (change of) liver, not elsewhere classified: Secondary | ICD-10-CM | POA: Insufficient documentation

## 2015-06-03 DIAGNOSIS — I517 Cardiomegaly: Secondary | ICD-10-CM | POA: Insufficient documentation

## 2015-06-03 LAB — CREATININE, SERUM
Creatinine, Ser: 0.74 mg/dL (ref 0.44–1.00)
GFR calc Af Amer: >60 mL/min (ref >60)
GFR calc non Af Amer: >60 mL/min (ref >60)

## 2015-06-03 MED ORDER — GADOBENATE DIMEGLUMINE 529 MG/ML IV SOLN
19.0000 mL | Freq: Once | INTRAVENOUS | Status: AC
  Administered 2015-06-03: 19 mL via INTRAVENOUS

## 2015-06-04 ENCOUNTER — Telehealth: Payer: Self-pay | Admitting: Internal Medicine

## 2015-06-04 NOTE — Telephone Encounter (Signed)
Called to inform patient of MRI abdomen results. No answer, left a message to call back for results. If patient calls back, please inform her or her daughter that the MRI showed stable cysts in her pancreas that were similar to her MRI one year ago. No follow up is needed at this time for her pancreatic cysts.

## 2015-06-12 ENCOUNTER — Other Ambulatory Visit: Payer: Self-pay

## 2015-06-12 DIAGNOSIS — Z23 Encounter for immunization: Secondary | ICD-10-CM | POA: Diagnosis not present

## 2015-06-13 ENCOUNTER — Telehealth: Payer: Self-pay | Admitting: Oncology

## 2015-06-13 NOTE — Telephone Encounter (Signed)
Called in to reschedule her lab  anne

## 2015-07-11 ENCOUNTER — Other Ambulatory Visit: Payer: Medicare Other

## 2015-07-11 ENCOUNTER — Other Ambulatory Visit (HOSPITAL_BASED_OUTPATIENT_CLINIC_OR_DEPARTMENT_OTHER): Payer: Medicare Other

## 2015-07-11 DIAGNOSIS — M899 Disorder of bone, unspecified: Secondary | ICD-10-CM | POA: Diagnosis not present

## 2015-07-11 DIAGNOSIS — C50919 Malignant neoplasm of unspecified site of unspecified female breast: Secondary | ICD-10-CM | POA: Diagnosis not present

## 2015-07-11 DIAGNOSIS — E559 Vitamin D deficiency, unspecified: Secondary | ICD-10-CM | POA: Diagnosis not present

## 2015-07-11 DIAGNOSIS — M8580 Other specified disorders of bone density and structure, unspecified site: Secondary | ICD-10-CM | POA: Diagnosis not present

## 2015-07-11 LAB — COMPREHENSIVE METABOLIC PANEL (CC13)
ALBUMIN: 3.5 g/dL (ref 3.5–5.0)
ALK PHOS: 50 U/L (ref 40–150)
ALT: 18 U/L (ref 0–55)
AST: 16 U/L (ref 5–34)
Anion Gap: 7 mEq/L (ref 3–11)
BUN: 16.4 mg/dL (ref 7.0–26.0)
CHLORIDE: 107 meq/L (ref 98–109)
CO2: 28 meq/L (ref 22–29)
Calcium: 9.5 mg/dL (ref 8.4–10.4)
Creatinine: 0.8 mg/dL (ref 0.6–1.1)
EGFR: 71 mL/min/{1.73_m2} — AB (ref >90)
GLUCOSE: 111 mg/dL (ref 70–140)
POTASSIUM: 4.2 meq/L (ref 3.5–5.1)
SODIUM: 141 meq/L (ref 136–145)
Total Bilirubin: <0.3 mg/dL (ref 0.20–1.20)
Total Protein: 6.6 g/dL (ref 6.4–8.3)

## 2015-07-11 LAB — CBC WITH DIFFERENTIAL/PLATELET
BASO%: 0.2 % (ref 0.0–2.0)
BASOS ABS: 0 10*3/uL (ref 0.0–0.1)
EOS%: 1.1 % (ref 0.0–7.0)
Eosinophils Absolute: 0.1 10*3/uL (ref 0.0–0.5)
HCT: 36.6 % (ref 34.8–46.6)
HGB: 11.9 g/dL (ref 11.6–15.9)
LYMPH%: 26.6 % (ref 14.0–49.7)
MCH: 28.4 pg (ref 25.1–34.0)
MCHC: 32.6 g/dL (ref 31.5–36.0)
MCV: 86.9 fL (ref 79.5–101.0)
MONO#: 0.4 10*3/uL (ref 0.1–0.9)
MONO%: 6.5 % (ref 0.0–14.0)
NEUT#: 4 10*3/uL (ref 1.5–6.5)
NEUT%: 65.6 % (ref 38.4–76.8)
Platelets: 259 10*3/uL (ref 145–400)
RBC: 4.21 10*6/uL (ref 3.70–5.45)
RDW: 13.5 % (ref 11.2–14.5)
WBC: 6.2 10*3/uL (ref 3.9–10.3)
lymph#: 1.6 10*3/uL (ref 0.9–3.3)

## 2015-07-12 ENCOUNTER — Telehealth: Payer: Self-pay | Admitting: Internal Medicine

## 2015-07-12 LAB — VITAMIN D 25 HYDROXY (VIT D DEFICIENCY, FRACTURES): Vit D, 25-Hydroxy: 36 ng/mL (ref 30–100)

## 2015-07-12 NOTE — Telephone Encounter (Signed)
Pt daughter requesting the nurse to call back regarding mother lab result.

## 2015-07-12 NOTE — Telephone Encounter (Signed)
Spoke with [name], advised her that per note from Dr. Marvel Plan on 06/04/15 her mothers MRI should stable pancreatic cysts that are similar to those seen on the prior years images and no further follow up is needed at this time.

## 2015-07-18 ENCOUNTER — Ambulatory Visit (HOSPITAL_BASED_OUTPATIENT_CLINIC_OR_DEPARTMENT_OTHER): Payer: Medicare Other | Admitting: Oncology

## 2015-07-18 ENCOUNTER — Telehealth: Payer: Self-pay | Admitting: Oncology

## 2015-07-18 VITALS — BP 141/61 | HR 80 | Temp 98.4°F | Resp 18 | Ht 64.25 in | Wt 157.5 lb

## 2015-07-18 DIAGNOSIS — M8580 Other specified disorders of bone density and structure, unspecified site: Secondary | ICD-10-CM | POA: Diagnosis not present

## 2015-07-18 DIAGNOSIS — Z170 Estrogen receptor positive status [ER+]: Secondary | ICD-10-CM

## 2015-07-18 DIAGNOSIS — C50912 Malignant neoplasm of unspecified site of left female breast: Secondary | ICD-10-CM | POA: Diagnosis not present

## 2015-07-18 DIAGNOSIS — I890 Lymphedema, not elsewhere classified: Secondary | ICD-10-CM | POA: Diagnosis not present

## 2015-07-18 NOTE — Progress Notes (Signed)
ID: Cynthia Griffin   DOB: 12/11/1945  MR#: [id number]  PZW#:[id number]  PCP: Eliezer Lofts, MD GYN:  SU: Stark Klein OTHER MD: Kyung Rudd, Johnnette Gourd, Crissie Reese  ALERT: Another patient with the same date of birth, middle initial, and also with a history of breast cancer is in EPIC. Please make sure when dealing with Lashika L. Moat that you have the correct medical record number   CHIEF COMPLAINT:  Estrogen receptor positive breast cancer  CURRENT TREATMENT: Tamoxifen  HISTORY OF PRESENT ILLNESS: From the original intake note:  The patient tells me she had an unremarkable screening mammogram January of 2013 in [place]. I do not have that report. Shortly after that, February of 2013, she noted that her left nipple had become inverted. She initially thought this was due to the mammogram itself, but eventually brought it to her primary care physician's attention. This was followed and there was no apparent change when she was next seen December 2013. The patient however became concerned and decided to go back to [facility] for her annual mammography This is where she had her annual mammograms until the last couple of years. Bilateral diagnostic mammography and left ultrasonography there 10/24/2012 showed definite nipple deformity with inversion and retraction. There was subtle architectural distortion and increase in density relative to the preceding study. Left breast ultrasonography in addition to the nipple retraction and inversion showed an area of hyperechoic architectural distortion measuring 1.4 cm in diameter with Doppler flow. Breast specific gamma imaging was obtained February 2024 and confirmed an area of moderate intensity isotope activity following a radial distribution from the left nipple measuring at least 4 cm. 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  Bilateral breast MRI was obtained 10/31/2012 and showed an area of diffuse non-masslike asymmetric enhancement in the inferior aspect of the left breast measuring up to 7.4 cm transversely. There were no enlarged axillary or internal mammary nodes noted and the right breast was unremarkable.   The patient's subsequent history is as detailed below.  INTERVAL HISTORY: Cynthia Griffin returns today for followup of her estrogen receptor positive breast cancer. She continues on tamoxifen, which she tolerates well. She does not have significant hot flashes or vaginal wetness problems. She obtains it at about $12 a month. She does feel her hair is thinning a little, and her nails are brittle. Possibly those could be related to her tamoxifen treatments.  REVIEW OF SYSTEMS:  Georgeann's mother is now living in a retirement home, and is pretty much wheelchair-bound. Elisabel is still intimately involved in her care and this takes quite a bit over time. Of course she is also heavily volunteering for a [employer]. She has now 6 grandchildren of whom 3 live in [place]. Those exercises by walking 20-25 minutes most days. She is coming "off a cold", still has a little bit of ringing in her years and a little bit of a dry cough. Otherwise a detailed review of systems today was noncontributory  PAST MEDICAL HISTORY: Past Medical History  Diagnosis Date  . Arthritis   . Anxiety   . Carpal tunnel syndrome of right wrist   . Family history of anesthesia complication     pneumothorax - post op 1980's  . Breast cancer 11/14/12    left, ER +, PR -, HER  2 -  . Pneumothorax on right March, 2014    while having port-a-cath placed  . Pneumonia   . 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 Hx of radiation therapy 05/09/13-06/22/13    left breast 60.4Gy  . Anemia     hx    PAST SURGICAL HISTORY: Past Surgical History   Procedure Laterality Date  . Appendectomy    . Tonsillectomy    . Meniscus repair      Right Knee  . Mastectomy w/ sentinel node biopsy Left 11/14/2012    Procedure: MASTECTOMY WITH SENTINEL LYMPH NODE BIOPSY;  Surgeon: Stark Klein, MD;  Location: [facility];  Service: General;  Laterality: Left;  . Axillary lymph node dissection Left 12/01/2012    Procedure: AXILLARY LYMPH NODE DISSECTION;  Surgeon: Stark Klein, MD;  Location: [facility];  Service: General;  Laterality: Left;  . Portacath placement Right 12/01/2012    Procedure: INSERTION PORT-A-CATH;  Surgeon: Stark Klein, MD;  Location: [facility];  Service: General;  Laterality: Right;  . Abdominal hysterectomy  1992    fibroids, total  . Mastectomy Left December 20, 2012  . Eye surgery Bilateral 2006    lasik surgery  . Port-a-cath removal N/A 05/02/2013    Procedure: REMOVAL PORT-A-CATH;  Surgeon: Stark Klein, MD;  Location: [facility];  Service: General;  Laterality: N/A;  . Shoulder arthroscopy Right 1/15    rotator cuff repair  . Latissimus flap to breast Left 03/29/2014    dr Harlow Mares  . Latissimus flap to breast Left 03/29/2014    Procedure: LEFT LATISSIMUS FLAP;  Surgeon: Crissie Reese, MD;  Location: [facility];  Service: Plastics;  Laterality: Left;   right shoulder rotator cuff repair, right hand carpal tunnel syndrome, bilateral salpingo--oophorectomy with her hysterectomy in the 1990s, Lasix surgery, right toe surgery  FAMILY HISTORY Family History  Problem Relation Age of Onset  . Bladder Cancer Father 66  . Lung cancer Father 22  . Diabetes Father   . Bone cancer Paternal Uncle     ? late 70s  . Diabetes Mother   . Peripheral vascular disease Mother   . Multiple sclerosis Sister   . Parkinson's disease Paternal Grandmother   . Heart disease Paternal Grandfather    the patient's father died at the age of 88. He was diagnosed with lung cancer the age of 50 and with bladder cancer at the age of 42. He  was a smoker. The patient's mother is living at age 8. She has a history of basal cell carcinoma. Cynthia Griffin had no brothers, one sister. There is no history of breast or ovarian cancer in the immediate family.  GYNECOLOGIC HISTORY: Menarche age 84, first live birth age 67, she is [place] P2. She had her hysterectomy she thinks in 1992, and took hormone replacement for approximately 18 years. In addition she took birth control pills between 1968 and 1977 stopping only to conceive.  SOCIAL HISTORY: She is a retired Web designer. Her husband Nivea Wojdyla (goes by Francee Piccolo) is in Press photographer for at [employer] in [place]. Son Sherlin Sonier lives in [place] and works in Pharmacologist. Son R.Natalea Sutliff II lives in [place] and works in Press photographer. The patient has 5 grandchildren" one on the way".   ADVANCED DIRECTIVES: In place  HEALTH MAINTENANCE:  (Updated 09/04/2013) Social History  Substance Use Topics  . Smoking status: Former Smoker -- 1.25 packs/day for 29  years    Types: Cigarettes    Quit date: 09/07/1992  . Smokeless tobacco: Never Used  . Alcohol Use: 4.2 oz/week    7 Glasses of wine per week     Colonoscopy: August 2003  PAP: Status post hysterectomy  Bone density: Never - scheduled for Feb 2015  Lipid panel: Dr. Diona Browner   Allergies  Allergen Reactions  . Sulfa Antibiotics Nausea Only and Other (See Comments)    Stomach cramps    Current Outpatient Prescriptions  Medication Sig Dispense Refill  . Biotin 1000 MCG tablet Take 1,000 mcg by mouth daily.    . calcium citrate-vitamin D (CITRACAL+D) 315-200 MG-UNIT per tablet Take 1 tablet by mouth 2 (two) times daily.     . citalopram (CELEXA) 40 MG tablet TAKE 1 TABLET BY MOUTH ONCE A DAY 90 tablet 1  . Multiple Vitamin (MULTIVITAMIN WITH MINERALS) TABS Take 1 tablet by mouth daily.    . tamoxifen (NOLVADEX) 20 MG tablet Take 1 tablet (20 mg total) by mouth daily. 90 tablet 3   No current  facility-administered medications for this visit.    OBJECTIVE: Middle-aged white woman who appearsstated age  69 Vitals:   07/18/15 1341  BP: 141/61  Pulse: 80  Temp: 98.4 F (36.9 C)  Resp: 18     Body mass index is 26.82 kg/(m^2).    ECOG FS: 0  Filed Weights   07/18/15 1341  Weight: 157 lb 8 oz (71.442 kg)   Sclerae unicteric, pupils round and equal Oropharynx clear and moist-- no thrush or other lesions No cervical or supraclavicular adenopathy Lungs no rales or rhonchi Heart regular rate and rhythm Abd soft, nontender, positive bowel sounds MSK no focal spinal tenderness, no upper extremity lymphedema Neuro: nonfocal, well oriented, appropriate affect Breasts: The right breast is unremarkable. The left breast is status post mastectomy, with no reconstruction. There is no evidence of chest wall recurrence. The left axilla is benign.   LAB RESULTS: Lab Results  Component Value Date   WBC 6.2 07/11/2015   NEUTROABS 4.0 07/11/2015   HGB 11.9 07/11/2015   HCT 36.6 07/11/2015   MCV 86.9 07/11/2015   PLT 259 07/11/2015   CMP     Component Value Date/Time   NA 141 07/11/2015 1105   NA 141 10/17/2014 0830   K 4.2 07/11/2015 1105   K 4.0 10/17/2014 0830   CL 106 10/17/2014 0830   CL 107 02/21/2013 1028   CO2 28 07/11/2015 1105   CO2 30 10/17/2014 0830   GLUCOSE 111 07/11/2015 1105   GLUCOSE 94 10/17/2014 0830   GLUCOSE 97 02/21/2013 1028   BUN 16.4 07/11/2015 1105   BUN 15 10/17/2014 0830   CREATININE 0.8 07/11/2015 1105   CREATININE 0.90 10/17/2014 0830   CALCIUM 9.5 07/11/2015 1105   CALCIUM 9.6 10/17/2014 0830   PROT 6.6 07/11/2015 1105   PROT 6.7 10/17/2014 0830   ALBUMIN 3.5 07/11/2015 1105   ALBUMIN 4.0 10/17/2014 0830   AST 16 07/11/2015 1105   AST 17 10/17/2014 0830   ALT 18 07/11/2015 1105   ALT 15 10/17/2014 0830   ALKPHOS 50 07/11/2015 1105   ALKPHOS 40 10/17/2014 0830   BILITOT <0.30 07/11/2015 1105   BILITOT 0.4 10/17/2014 0830    GFRNONAA 73* 03/21/2014 1330   GFRAA 85* 03/21/2014 1330    STUDIES:  right mammography with tomosynthesis at [facility] 10/30/2014 showed no evidence of malignancy.  ASSESSMENT: 68 y.o. [place] woman   (1)  status  post left mastectomy and axillary lymph node dissection March 2014 for a pT3 pN2, stage IIIA invasive lobular carcinoma, grade 1, estrogen receptor 100% positive, progesterone receptor and HER-2 negative, with an MIB-1 of 8%.  (2) R pneumothorax with port placement March 2014- resolved  (3)  treated in the adjuvant setting with docetaxel/ doxorubicin/ cyclophosphamide, the original plan being to complete 6 q. three-week doses, with Neulasta given on day 2 for granulocyte support  (4) switched to carboplatin and gemcitabine given day 1 only for cycles 5 and 6 of chemotherapy because of concerns regarding developing neuropathy; completed 04/04/2013  (5)  lymphedema, left upper extremity, grade 1  (6) received postmastectomy radiation, completed mid October  (7)  started on tamoxifen, 20 mg daily, in late October 2014  (8) underwent a left latissimus flap reconstruction 03/29/2014, unfortunately with flap necrosis. The patient does not intend further reconstruction.  (9)  osteopenia with a T score of -2.0 February 2015   PLAN:  Shatavia is doing terrific now nearly 3 years out from her definitive surgery with no evidence of disease recurrence. She continues on tamoxifen with excellent tolerance.  I am not convinced of the nail and hair changes are related. She is already taking biotin, which is not helping. .  her primary doctor probably checks her thyroid and that of course could be another contributing factor to that.  As far as the leg cramps at night are concerned I suggested she drink a half a cup of tonic water before she goes to bed. That continues quinine and we will also hydrate her  Today we spent a little extra time discussing anastrozole. She understands that 2 years  of tamoxifen followed by 3 years of anastrozole is superior to 5 years of tamoxifen. The difference in risk is approximately 3%. Another option for her would be to continue tamoxifen for total of 10 years. Alternatively she could simply continue tamoxifen for 5 years and then forego the 3% additional reduction if she continues it.  When she very much wants to do is continue tamoxifen and she would not mind doing it for 10 years. She has essentially no side effects from it of concern, obtains it at a good price, and it is also helping her osteopenia.  Accordingly that will be the plan. If she sees Dr. Barry Dienes in March and sees her primary care physician in July, she can see me in November of next year and then she sees one of her doctors every 4 months would which be optimal.   She knows to call for any problems that may develop before her next visit here.  Chauncey Cruel, MD     07/18/2015

## 2015-07-18 NOTE — Telephone Encounter (Signed)
Appointments made and avs printed for patient °

## 2015-07-30 ENCOUNTER — Other Ambulatory Visit: Payer: Self-pay | Admitting: Internal Medicine

## 2015-07-30 DIAGNOSIS — Z1231 Encounter for screening mammogram for malignant neoplasm of breast: Secondary | ICD-10-CM

## 2015-08-14 ENCOUNTER — Ambulatory Visit (INDEPENDENT_AMBULATORY_CARE_PROVIDER_SITE_OTHER): Payer: Medicare Other | Admitting: Family Medicine

## 2015-08-14 ENCOUNTER — Encounter: Payer: Self-pay | Admitting: Family Medicine

## 2015-08-14 VITALS — BP 110/60 | HR 83 | Temp 98.7°F | Ht 64.25 in | Wt 157.5 lb

## 2015-08-14 DIAGNOSIS — J9801 Acute bronchospasm: Secondary | ICD-10-CM | POA: Diagnosis not present

## 2015-08-14 MED ORDER — PREDNISONE 10 MG PO TABS: ORAL_TABLET | ORAL | Status: DC

## 2015-08-14 MED ORDER — ALBUTEROL SULFATE HFA 108 (90 BASE) MCG/ACT IN AERS: 2.0000 | INHALATION_SPRAY | Freq: Four times a day (QID) | RESPIRATORY_TRACT | Status: DC | PRN

## 2015-08-14 NOTE — Patient Instructions (Signed)
Complete prednisone taper.  Use albuterol as needed for coughing fits.  Call if not improving as expected. Go to Er for severe shortness of breath.

## 2015-08-14 NOTE — Assessment & Plan Note (Signed)
No clear sign of ongoing infection or allergies.  Will treat with albuterol , pred taper and time.  if not improving after this treatment consider CXR to rule out other causes of chronic cough.

## 2015-08-14 NOTE — Progress Notes (Signed)
Pre visit review using our clinic review tool, if applicable. No additional management support is needed unless otherwise documented below in the visit note. 

## 2015-08-14 NOTE — Progress Notes (Signed)
   Subjective:    Patient ID: Cynthia Griffin, female    DOB: 12-08-1945, 69 y.o.   MRN: [id number]  Cough This is a chronic problem. The current episode started more than 1 month ago (initially had cold felt ill, then resolved except for continued cough). The problem has been unchanged. The cough is productive of sputum (clear watery mucus). Associated symptoms include rhinorrhea and a sore throat. Pertinent negatives include no ear congestion, ear pain, fever, myalgias, nasal congestion, postnasal drip, shortness of breath or wheezing. Associated symptoms comments: No face pain. The symptoms are aggravated by lying down. Risk factors for lung disease include smoking/tobacco exposure ( quit 36 year ago). Treatments tried: mucinex DM. The treatment provided mild relief. There is no history of asthma, bronchiectasis, bronchitis, COPD, emphysema, environmental allergies or pneumonia.    Social History /Family History/Past Medical History reviewed and updated if needed.   Review of Systems  Constitutional: Negative for fever.  HENT: Positive for rhinorrhea and sore throat. Negative for ear pain and postnasal drip.   Respiratory: Positive for cough. Negative for shortness of breath and wheezing.   Musculoskeletal: Negative for myalgias.  Allergic/Immunologic: Negative for environmental allergies.       Objective:   Physical Exam  Constitutional: Vital signs are normal. She appears well-developed and well-nourished. She is cooperative.  Non-toxic appearance. She does not appear ill. No distress.  HENT:  Head: Normocephalic.  Right Ear: Hearing, tympanic membrane, external ear and ear canal normal. Tympanic membrane is not erythematous, not retracted and not bulging.  Left Ear: Hearing, tympanic membrane, external ear and ear canal normal. Tympanic membrane is not erythematous, not retracted and not bulging.  Nose: Mucosal edema and rhinorrhea present. Right sinus exhibits no maxillary sinus  tenderness and no frontal sinus tenderness. Left sinus exhibits no maxillary sinus tenderness and no frontal sinus tenderness.  Mouth/Throat: Uvula is midline, oropharynx is clear and moist and mucous membranes are normal.  Eyes: Conjunctivae, EOM and lids are normal. Pupils are equal, round, and reactive to light. Lids are everted and swept, no foreign bodies found.  Neck: Trachea normal and normal range of motion. Neck supple. Carotid bruit is not present. No thyroid mass and no thyromegaly present.  Cardiovascular: Normal rate, regular rhythm, S1 normal, S2 normal, normal heart sounds, intact distal pulses and normal pulses.  Exam reveals no gallop and no friction rub.   No murmur heard. Pulmonary/Chest: Effort normal and breath sounds normal. No tachypnea. No respiratory distress. She has no decreased breath sounds. She has no wheezes. She has no rhonchi. She has no rales.  Neurological: She is alert.  Skin: Skin is warm, dry and intact. No rash noted.  Psychiatric: Her speech is normal and behavior is normal. Judgment normal. Her mood appears not anxious. Cognition and memory are normal. She does not exhibit a depressed mood.          Assessment & Plan:

## 2015-08-26 DIAGNOSIS — F209 Schizophrenia, unspecified: Secondary | ICD-10-CM | POA: Diagnosis not present

## 2015-08-28 ENCOUNTER — Encounter: Payer: Self-pay | Admitting: Internal Medicine

## 2015-09-11 ENCOUNTER — Ambulatory Visit: Payer: Self-pay

## 2015-09-11 ENCOUNTER — Other Ambulatory Visit: Payer: Self-pay | Admitting: Internal Medicine

## 2015-09-11 ENCOUNTER — Ambulatory Visit (INDEPENDENT_AMBULATORY_CARE_PROVIDER_SITE_OTHER): Payer: Medicare Other | Admitting: Internal Medicine

## 2015-09-11 ENCOUNTER — Encounter: Payer: Self-pay | Admitting: Internal Medicine

## 2015-09-11 ENCOUNTER — Ambulatory Visit
Admission: RE | Admit: 2015-09-11 | Discharge: 2015-09-11 | Disposition: A | Discharge status: Home / Self Care | Payer: Medicare Other | Source: Ambulatory Visit | Attending: Internal Medicine | Admitting: Internal Medicine

## 2015-09-11 VITALS — BP 171/77 | HR 73 | Temp 97.8°F | Ht 70.0 in | Wt 208.3 lb

## 2015-09-11 DIAGNOSIS — Z794 Long term (current) use of insulin: Secondary | ICD-10-CM | POA: Diagnosis not present

## 2015-09-11 DIAGNOSIS — E1165 Type 2 diabetes mellitus with hyperglycemia: Secondary | ICD-10-CM | POA: Diagnosis not present

## 2015-09-11 DIAGNOSIS — Z1231 Encounter for screening mammogram for malignant neoplasm of breast: Secondary | ICD-10-CM

## 2015-09-11 DIAGNOSIS — Z0000 Encounter for general adult medical examination without abnormal findings: Secondary | ICD-10-CM

## 2015-09-11 DIAGNOSIS — I10 Essential (primary) hypertension: Secondary | ICD-10-CM

## 2015-09-11 DIAGNOSIS — C50912 Malignant neoplasm of unspecified site of left female breast: Secondary | ICD-10-CM

## 2015-09-11 DIAGNOSIS — Z853 Personal history of malignant neoplasm of breast: Secondary | ICD-10-CM | POA: Diagnosis not present

## 2015-09-11 DIAGNOSIS — E113292 Type 2 diabetes mellitus with mild nonproliferative diabetic retinopathy without macular edema, left eye: Secondary | ICD-10-CM | POA: Diagnosis not present

## 2015-09-11 DIAGNOSIS — IMO0001 Reserved for inherently not codable concepts without codable children: Secondary | ICD-10-CM

## 2015-09-11 DIAGNOSIS — F209 Schizophrenia, unspecified: Secondary | ICD-10-CM | POA: Diagnosis not present

## 2015-09-11 DIAGNOSIS — E103299 Type 1 diabetes mellitus with mild nonproliferative diabetic retinopathy without macular edema, unspecified eye: Secondary | ICD-10-CM

## 2015-09-11 LAB — POCT GLYCOSYLATED HEMOGLOBIN (HGB A1C): HEMOGLOBIN A1C: 7.6

## 2015-09-11 LAB — HM DIABETES EYE EXAM

## 2015-09-11 LAB — GLUCOSE, CAPILLARY: GLUCOSE-CAPILLARY: 106 mg/dL — AB (ref 65–99)

## 2015-09-11 MED ORDER — TRAMADOL HCL 50 MG PO TABS: 50.0000 mg | ORAL_TABLET | Freq: Three times a day (TID) | ORAL | Status: DC | PRN

## 2015-09-11 MED ORDER — LISINOPRIL 20 MG PO TABS: 20.0000 mg | ORAL_TABLET | Freq: Every day | ORAL | Status: DC

## 2015-09-11 NOTE — Patient Instructions (Addendum)
TAKE LISINOPRIL 20 MG A DAY FOR BLOOD PRESSURE. IF BLOOD PRESSURE AT HOME IS CONSISTENTLY IN THE 150s/90s OR HIGHER PLEASE LET us KNOW  PLEASE COMPLETE YOUR STOOL CARDS  FOR YOUR DIABETES, TAKE LEVEMIR 35 UNITS AT NIGHT.

## 2015-09-11 NOTE — Progress Notes (Signed)
Subjective:    Patient ID: Magdiel Choma, female    DOB: [birth year], 70 y.o.   MRN: [id number]  HPI Jasiya Carlisle is a 70 y.o. female with PMHx of t2dm, HTN, and schizophrenia who presents to the clinic for follow up for HTN. Please see A&P for the status of the patient's chronic medical problems.   Past Medical History  Diagnosis Date  . Diabetes mellitus   . GERD (gastroesophageal reflux disease)   . Hyperlipidemia   . Hypertension   . Schizophrenia ([place])   . Atypical nevus   . History of mitral valve prolapse   . Breast cancer ([place]) 07/14/06    Left breast. Ductal carcinoma, grade 3. Followed by Dr. Truddie Coco. s/p lumpectomy with radiation  . Goiter     U/S 08/2009-persistent multiple small nodules, one in the right lower pole slightly enlarged, recommend continued followup.  . Ovarian cyst   . Fibroids   . DVT (deep vein thrombosis) in pregnancy 1982    Affected the RLE. Required coumadin.  . Dental caries     Outpatient Encounter Prescriptions as of 09/11/2015  Medication Sig  . aspirin 81 MG tablet Take 1 tablet (81 mg total) by mouth daily.  . calcium-vitamin D (OSCAL 500/200 D-3) 500-200 MG-UNIT per tablet Take 1 tablet by mouth 3 (three) times daily.  . Insulin Detemir (LEVEMIR FLEXPEN) 100 UNIT/ML Pen Inject 33 Units into the skin daily at 10 pm.  . lisinopril (PRINIVIL,ZESTRIL) 20 MG tablet Take 1 tablet (20 mg total) by mouth daily.  . metFORMIN (GLUCOPHAGE) 1000 MG tablet Take 1 tablet (1,000 mg total) by mouth 2 (two) times daily with a meal.  . metoprolol (LOPRESSOR) 50 MG tablet Take 2 tablets (100 mg total) by mouth 2 (two) times daily.  Marland Kitchen OLANZapine (ZYPREXA) 10 MG tablet Take 30 mg by mouth at bedtime.   . polyethylene glycol (MIRALAX / GLYCOLAX) packet Take 17 g by mouth daily.  . pravastatin (PRAVACHOL) 40 MG tablet Take 1 tablet (40 mg total) by mouth daily.  . QUEtiapine (SEROQUEL) 50 MG tablet Take 150 mg by mouth at bedtime.  . ranitidine (ZANTAC) 150 MG  tablet Take 1 tablet (150 mg total) by mouth 2 (two) times daily.  . traMADol (ULTRAM) 50 MG tablet Take 1 tablet (50 mg total) by mouth every 8 (eight) hours as needed for severe pain. Do not fill until 30 days after last fill  . [DISCONTINUED] lisinopril (PRINIVIL,ZESTRIL) 10 MG tablet Take 1 tablet (10 mg total) by mouth daily.  . [DISCONTINUED] traMADol (ULTRAM) 50 MG tablet Take 1 tablet (50 mg total) by mouth every 8 (eight) hours as needed for severe pain. Do not fill until 30 days after last fill   No facility-administered encounter medications on file as of 09/11/2015.    Family History  Problem Relation Age of Onset  . Diabetes Mother   . Diabetes Father     Social History   Social History  . Marital Status: Widowed    Spouse Name: N/A  . Number of Children: N/A  . Years of Education: N/A   Occupational History  . Not on file.   Social History Main Topics  . Smoking status: Former Research scientist (life sciences)  . Smokeless tobacco: Never Used     Comment: quit 9 yrs ago.  . Alcohol Use: No  . Drug Use: No  . Sexual Activity: Not on file   Other Topics Concern  . Not on file   Social  History Narrative   Widow.   Used to work as a Quarry manager, now retired.         Review of Systems General: Denies fatigue, change in appetite and diaphoresis.  Respiratory: Denies SOB, cough, DOE.   Cardiovascular: Denies chest pain and palpitations.  Gastrointestinal: Admits to nausea. Denies vomiting, abdominal pain, diarrhea, constipation, blood in stool  Endocrine: Denies polyuria, and polydipsia.  Neurological: Admits to headache. Denies dizziness, weakness, lightheadedness Psychiatric/Behavioral: Denies mood changes, confusion, and agitation.     Objective:   Physical Exam Filed Vitals:   09/11/15 1532  BP: 171/77  Pulse: 73  Temp: 97.8 F (36.6 C)  TempSrc: Oral  Height: 5\' 10"  (1.778 m)  Weight: 208 lb 4.8 oz (94.484 kg)  SpO2: 99%   General: Vital signs reviewed.  Patient is  well-developed and well-nourished, in no acute distress and cooperative with exam.  Cardiovascular: RRR, S1 normal, S2 normal, no murmurs, gallops, or rubs. Pulmonary/Chest: Clear to auscultation bilaterally, no wheezes, rales, or rhonchi. Abdominal: Soft, non-tender, non-distended, BS +.  Extremities: No lower extremity edema bilaterally Skin: Warm, dry and intact. Psychiatric: Normal mood and affect.      Assessment & Plan:   Please see problem based assessment and plan.

## 2015-09-12 MED ORDER — INSULIN DETEMIR 100 UNIT/ML FLEXPEN: 35.0000 [IU] | PEN_INJECTOR | Freq: Every day | SUBCUTANEOUS | Status: DC

## 2015-09-12 NOTE — Progress Notes (Signed)
Internal Medicine Clinic Attending  Case discussed with Dr. Richardson soon after the resident saw the patient.  We reviewed the resident's history and exam and pertinent patient test results.  I agree with the assessment, diagnosis, and plan of care documented in the resident's note. 

## 2015-09-12 NOTE — Assessment & Plan Note (Signed)
Daughter reports mother has been controlled for the last 2 years on Olanzapine 30 mg QHS and Seroquel 150 mg QHS for her paranoid schizophrenia. No acute issues.  Plan: -Continue current medications

## 2015-09-12 NOTE — Assessment & Plan Note (Signed)
Mammogram completed 09/11/15 was normal. Repeat in 1 year.

## 2015-09-12 NOTE — Assessment & Plan Note (Signed)
BP Readings from Last 3 Encounters:  09/11/15 171/77  05/27/15 148/82  02/20/15 163/69    Lab Results  Component Value Date   NA 141 10/12/2014   K 4.1 10/12/2014   CREATININE 0.74 06/03/2015    Assessment: Blood pressure control:  Uncontrolled Progress toward BP goal:   Above goal Comments: Family reports increasing BP at home despite being on metoprolol 100 BID and lisinopril 10 mg QD.  Plan: Medications:  Increase Lisinopril to 20 mg daily. Continue Metoprolol 100 mg BID. Educational resources provided: brochure, handout, video Self management tools provided:  Check BP at home Other plans: F/u in 3 months. Daughter will call if BP remains >150/90 at home despite increase in lisinopril.

## 2015-09-12 NOTE — Assessment & Plan Note (Signed)
Lab Results  Component Value Date   HGBA1C 7.6 09/11/2015   HGBA1C 7.5 05/27/2015   HGBA1C 7.8 02/20/2015     Assessment: Diabetes control:  Above goal Progress toward A1C goal:   Above goal Comments: Compliant with Levemir 33 units QHS. No reported hypoglycemia episodes or symptoms of hypoglycemia.   Plan: Medications:  Increase Levemir to 35 units QHS. Home glucose monitoring: Frequency:   Timing:   Instruction/counseling given: reminded to bring blood glucose meter & log to each visit and discussed diet Educational resources provided: brochure, handout Self management tools provided:   Other plans: F/u 3 months

## 2015-09-12 NOTE — Assessment & Plan Note (Signed)
Mammogram completed 09/11/15 was normal. Repeat in 1 year. Colonoscopy: Refused, provided with stool cards

## 2015-09-13 ENCOUNTER — Other Ambulatory Visit: Payer: Self-pay | Admitting: Internal Medicine

## 2015-09-27 ENCOUNTER — Telehealth: Payer: Self-pay | Admitting: Internal Medicine

## 2015-09-27 NOTE — Telephone Encounter (Signed)
NEEDS REFILL ON LISINOPRIL 20MG , TO [facility]

## 2015-09-30 DIAGNOSIS — E119 Type 2 diabetes mellitus without complications: Secondary | ICD-10-CM | POA: Diagnosis not present

## 2015-09-30 DIAGNOSIS — H2513 Age-related nuclear cataract, bilateral: Secondary | ICD-10-CM | POA: Diagnosis not present

## 2015-09-30 DIAGNOSIS — H5203 Hypermetropia, bilateral: Secondary | ICD-10-CM | POA: Diagnosis not present

## 2015-10-08 ENCOUNTER — Encounter: Payer: Self-pay | Admitting: Dietician

## 2015-10-15 NOTE — Addendum Note (Signed)
Addended by: Truddie Crumble on: 10/15/2015 04:04 PM   Modules accepted: Orders

## 2015-10-15 NOTE — Addendum Note (Signed)
Addended by: Vickii Chafe on: 10/15/2015 03:49 PM   Modules accepted: Orders

## 2015-10-21 NOTE — Telephone Encounter (Signed)
DOCTOR INCREASED DOSE FROM 10MG  TO 20MG  LAST VISIT FOR LIXINOPRIL 20 MG  PT STILL HAS NOT GOTTEN REFILL FOR NEW DOSE

## 2015-10-21 NOTE — Telephone Encounter (Signed)
Pt was calling in old RX. Called pharmacy and correction made.

## 2015-11-01 ENCOUNTER — Encounter: Payer: Self-pay | Admitting: Family Medicine

## 2015-11-01 DIAGNOSIS — Z1231 Encounter for screening mammogram for malignant neoplasm of breast: Secondary | ICD-10-CM | POA: Diagnosis not present

## 2015-11-01 DIAGNOSIS — Z853 Personal history of malignant neoplasm of breast: Secondary | ICD-10-CM | POA: Diagnosis not present

## 2015-11-07 ENCOUNTER — Other Ambulatory Visit: Payer: Self-pay | Admitting: Family Medicine

## 2015-11-07 NOTE — Telephone Encounter (Signed)
Rx sent electronically.  

## 2015-11-12 ENCOUNTER — Encounter: Payer: Self-pay | Admitting: Family Medicine

## 2015-11-18 ENCOUNTER — Telehealth: Payer: Self-pay | Admitting: Family Medicine

## 2015-11-18 ENCOUNTER — Other Ambulatory Visit (INDEPENDENT_AMBULATORY_CARE_PROVIDER_SITE_OTHER): Payer: Medicare Other

## 2015-11-18 ENCOUNTER — Other Ambulatory Visit: Payer: Self-pay | Admitting: Family Medicine

## 2015-11-18 DIAGNOSIS — M8580 Other specified disorders of bone density and structure, unspecified site: Secondary | ICD-10-CM

## 2015-11-18 DIAGNOSIS — Z13220 Encounter for screening for lipoid disorders: Secondary | ICD-10-CM | POA: Diagnosis not present

## 2015-11-18 DIAGNOSIS — Z1159 Encounter for screening for other viral diseases: Secondary | ICD-10-CM | POA: Diagnosis not present

## 2015-11-18 LAB — COMPREHENSIVE METABOLIC PANEL
ALT: 15 U/L (ref 0–35)
AST: 17 U/L (ref 0–37)
Albumin: 3.9 g/dL (ref 3.5–5.2)
Alkaline Phosphatase: 44 U/L (ref 39–117)
BILIRUBIN TOTAL: 0.4 mg/dL (ref 0.2–1.2)
BUN: 10 mg/dL (ref 6–23)
CALCIUM: 9.2 mg/dL (ref 8.4–10.5)
CHLORIDE: 105 meq/L (ref 96–112)
CO2: 29 meq/L (ref 19–32)
Creatinine, Ser: 0.89 mg/dL (ref 0.40–1.20)
GFR: 66.99 mL/min (ref >60.00)
GLUCOSE: 93 mg/dL (ref 70–99)
POTASSIUM: 3.9 meq/L (ref 3.5–5.1)
Sodium: 142 mEq/L (ref 135–145)
Total Protein: 6.6 g/dL (ref 6.0–8.3)

## 2015-11-18 LAB — LIPID PANEL
CHOL/HDL RATIO: 2
Cholesterol: 154 mg/dL (ref 0–200)
HDL: 70.2 mg/dL (ref >39.00)
LDL Cholesterol: 65 mg/dL (ref 0–99)
NONHDL: 83.7
TRIGLYCERIDES: 95 mg/dL (ref 0.0–149.0)
VLDL: 19 mg/dL (ref 0.0–40.0)

## 2015-11-18 LAB — VITAMIN D 25 HYDROXY (VIT D DEFICIENCY, FRACTURES): VITD: 27.51 ng/mL — ABNORMAL LOW (ref 30.00–100.00)

## 2015-11-18 NOTE — Telephone Encounter (Signed)
-----   Message from Ellamae Sia sent at 11/13/2015  5:50 PM EST ----- Regarding: Lab orders for Monday, 3.13.17 Patient is scheduled for CPX labs, please order future labs, Thanks , Karna Christmas

## 2015-11-19 LAB — HEPATITIS C ANTIBODY: HCV AB: NEGATIVE

## 2015-11-22 ENCOUNTER — Ambulatory Visit (INDEPENDENT_AMBULATORY_CARE_PROVIDER_SITE_OTHER): Payer: Medicare Other | Admitting: Family Medicine

## 2015-11-22 ENCOUNTER — Encounter: Payer: Self-pay | Admitting: Family Medicine

## 2015-11-22 VITALS — BP 106/64 | HR 78 | Temp 98.5°F | Ht 64.0 in | Wt 157.0 lb

## 2015-11-22 DIAGNOSIS — Z1211 Encounter for screening for malignant neoplasm of colon: Secondary | ICD-10-CM | POA: Diagnosis not present

## 2015-11-22 DIAGNOSIS — Z23 Encounter for immunization: Secondary | ICD-10-CM

## 2015-11-22 DIAGNOSIS — F324 Major depressive disorder, single episode, in partial remission: Secondary | ICD-10-CM

## 2015-11-22 DIAGNOSIS — Z0000 Encounter for general adult medical examination without abnormal findings: Secondary | ICD-10-CM | POA: Diagnosis not present

## 2015-11-22 DIAGNOSIS — Z7189 Other specified counseling: Secondary | ICD-10-CM

## 2015-11-22 NOTE — Patient Instructions (Addendum)
Stop at lab on way out for  ifob test.  Get advanced directive planning set up as able.  Work on [employer] and regular exercise.

## 2015-11-22 NOTE — Assessment & Plan Note (Signed)
Info given on EOL planning ( 2017)

## 2015-11-22 NOTE — Assessment & Plan Note (Signed)
Stable control on current medication. 

## 2015-11-22 NOTE — Progress Notes (Signed)
Pre visit review using our clinic review tool, if applicable. No additional management support is needed unless otherwise documented below in the visit note. 

## 2015-11-22 NOTE — Addendum Note (Signed)
Addended by: Pilar Grammes on: 11/22/2015 03:21 PM   Modules accepted: Orders

## 2015-11-22 NOTE — Progress Notes (Signed)
I have personally reviewed the Medicare Annual Wellness questionnaire and have noted 1. The patient's medical and social history 2. Their use of alcohol, tobacco or illicit drugs 3. Their current medications and supplements 4. The patient's functional ability including ADL's, fall risks, home safety risks and hearing or visual             impairment. 5. Diet and physical activities 6. Evidence for depression or mood disorders 7.         Updated provider list Cognitive evaluation was performed and recorded on pt medicare questionnaire form. The patients weight, height, BMI and visual acuity have been recorded in the chart  I have made referrals, counseling and provided education to the patient based review of the above and I have provided the pt with a written personalized care plan for preventive services.   Hx of breast cancer with left mastectomy, S/P chemo and radiation. Failed reconstruction in July 2015/ Followed by [facility]. Last OV 10/2014.  Labs reviewed in detail with pt. Lab Results  Component Value Date   CHOL 154 11/18/2015   HDL 70.20 11/18/2015   LDLCALC 65 11/18/2015   TRIG 95.0 11/18/2015   CHOLHDL 2 11/18/2015   Depression: Stable control on celexa. Cares for her mother.  BP Readings from Last 3 Encounters:  11/22/15 106/64  08/14/15 110/60  07/18/15 141/61  Good diet, regular exercise ( walking and bike riding 3-5 days a week).  Vit D  Slightly low, taking ca and vit D   Review of Systems  Constitutional: Negative for fever and fatigue.  HENT: Negative for ear pain.  Eyes: Negative for pain.  Respiratory: Negative for chest tightness and shortness of breath.  Cardiovascular: Negative for chest pain, palpitations and leg swelling.  Gastrointestinal: Negative for abdominal pain.  Genitourinary: Negative for dysuria.       Objective:   Physical Exam  Constitutional: Vital signs are normal. She appears well-developed and well-nourished. She is  cooperative. Non-toxic appearance. She does not appear ill. No distress.  HENT:  Head: Normocephalic.  Right Ear: Hearing, tympanic membrane, external ear and ear canal normal.  Left Ear: Hearing, tympanic membrane, external ear and ear canal normal.  Nose: Nose normal.  Eyes: Conjunctivae, EOM and lids are normal. Pupils are equal, round, and reactive to light. Lids are everted and swept, no foreign bodies found.  Neck: Trachea normal and normal range of motion. Neck supple. Carotid bruit is not present. No thyroid mass and no thyromegaly present.  Cardiovascular: Normal rate, regular rhythm, S1 normal, S2 normal, normal heart sounds and intact distal pulses. Exam reveals no gallop.  No murmur heard. Pulmonary/Chest: Effort normal and breath sounds normal. No respiratory distress. She has no wheezes. She has no rhonchi. She has no rales.  Abdominal: Soft. Normal appearance and bowel sounds are normal. She exhibits no distension, no fluid wave, no abdominal bruit and no mass. There is no hepatosplenomegaly. There is no tenderness. There is no rebound, no guarding and no CVA tenderness. No hernia.  Genitourinary: Not performed today as had exam at onc few weeks ago. Lymphadenopathy:   She has no cervical adenopathy.   She has no axillary adenopathy.  Neurological: She is alert. She has normal strength. No cranial nerve deficit or sensory deficit.  Skin: Skin is warm, dry and intact. No rash noted.  Psychiatric: Her speech is normal and behavior is normal. Judgment normal. Her mood appears not anxious. Cognition and memory are normal. She does not exhibit a  depressed mood.          Assessment & Plan:  The patient's preventative maintenance and recommended screening tests for an annual wellness exam were reviewed in full today. Brought up to date unless services declined.  Counselled on the importance of diet, exercise, and its role in overall health and mortality. The patient's  FH and SH was reviewed, including their home life, tobacco status, and drug and alcohol status.   Vaccines: uptodate with zoster and flu, Will do PCV 23 today. DEXA: osteopenia 10/2013 on ca and vit. repeat  2-5 years. Mammo: per onc, stable 10/2015 Colon: 2003, chosen ifob neg in 2016, plan repeating ifob test, refused cologuard. PAP/ DVE: total hysterectomy. Not indicated Hep C:  neg Nonsmoker

## 2015-12-02 ENCOUNTER — Other Ambulatory Visit: Payer: Self-pay | Admitting: Family Medicine

## 2015-12-02 ENCOUNTER — Other Ambulatory Visit (INDEPENDENT_AMBULATORY_CARE_PROVIDER_SITE_OTHER): Payer: Medicare Other

## 2015-12-02 DIAGNOSIS — Z1211 Encounter for screening for malignant neoplasm of colon: Secondary | ICD-10-CM

## 2015-12-03 ENCOUNTER — Other Ambulatory Visit: Payer: Self-pay | Admitting: Family Medicine

## 2015-12-03 DIAGNOSIS — Z1211 Encounter for screening for malignant neoplasm of colon: Secondary | ICD-10-CM

## 2015-12-03 LAB — FECAL OCCULT BLOOD, IMMUNOCHEMICAL: Fecal Occult Bld: NEGATIVE

## 2015-12-09 DIAGNOSIS — H2511 Age-related nuclear cataract, right eye: Secondary | ICD-10-CM | POA: Diagnosis not present

## 2015-12-09 DIAGNOSIS — H2513 Age-related nuclear cataract, bilateral: Secondary | ICD-10-CM | POA: Diagnosis not present

## 2015-12-13 DIAGNOSIS — F209 Schizophrenia, unspecified: Secondary | ICD-10-CM | POA: Diagnosis not present

## 2016-01-01 DIAGNOSIS — H2512 Age-related nuclear cataract, left eye: Secondary | ICD-10-CM | POA: Diagnosis not present

## 2016-01-01 DIAGNOSIS — H2511 Age-related nuclear cataract, right eye: Secondary | ICD-10-CM | POA: Diagnosis not present

## 2016-01-12 ENCOUNTER — Emergency Department: Payer: Medicare Other

## 2016-01-12 ENCOUNTER — Encounter: Payer: Self-pay | Admitting: Emergency Medicine

## 2016-01-12 ENCOUNTER — Emergency Department
Admission: EM | Admit: 2016-01-12 | Discharge: 2016-01-12 | Disposition: A | Discharge status: Home / Self Care | Payer: Medicare Other | Attending: Emergency Medicine | Admitting: Emergency Medicine

## 2016-01-12 DIAGNOSIS — J189 Pneumonia, unspecified organism: Secondary | ICD-10-CM | POA: Diagnosis not present

## 2016-01-12 DIAGNOSIS — R05 Cough: Secondary | ICD-10-CM | POA: Diagnosis not present

## 2016-01-12 DIAGNOSIS — M1990 Unspecified osteoarthritis, unspecified site: Secondary | ICD-10-CM | POA: Insufficient documentation

## 2016-01-12 DIAGNOSIS — R079 Chest pain, unspecified: Secondary | ICD-10-CM

## 2016-01-12 DIAGNOSIS — R0602 Shortness of breath: Secondary | ICD-10-CM

## 2016-01-12 DIAGNOSIS — Z87891 Personal history of nicotine dependence: Secondary | ICD-10-CM | POA: Diagnosis not present

## 2016-01-12 LAB — URINALYSIS COMPLETE WITH MICROSCOPIC (ARMC ONLY)
Bilirubin Urine: NEGATIVE
Glucose, UA: NEGATIVE mg/dL
HGB URINE DIPSTICK: NEGATIVE
Ketones, ur: NEGATIVE mg/dL
LEUKOCYTES UA: NEGATIVE
Nitrite: NEGATIVE
PH: 6 (ref 5.0–8.0)
Protein, ur: NEGATIVE mg/dL
Specific Gravity, Urine: 1.014 (ref 1.005–1.030)

## 2016-01-12 LAB — BASIC METABOLIC PANEL
Anion gap: 10 (ref 5–15)
BUN: 16 mg/dL (ref 6–20)
CALCIUM: 8.9 mg/dL (ref 8.9–10.3)
CHLORIDE: 106 mmol/L (ref 101–111)
CO2: 23 mmol/L (ref 22–32)
CREATININE: 0.81 mg/dL (ref 0.44–1.00)
Glucose, Bld: 128 mg/dL — ABNORMAL HIGH (ref 65–99)
Potassium: 3.8 mmol/L (ref 3.5–5.1)
SODIUM: 139 mmol/L (ref 135–145)

## 2016-01-12 LAB — CBC
HCT: 35.5 % (ref 35.0–47.0)
Hemoglobin: 11.9 g/dL — ABNORMAL LOW (ref 12.0–16.0)
MCH: 28.4 pg (ref 26.0–34.0)
MCHC: 33.6 g/dL (ref 32.0–36.0)
MCV: 84.4 fL (ref 80.0–100.0)
Platelets: 187 10*3/uL (ref 150–440)
RBC: 4.2 MIL/uL (ref 3.80–5.20)
RDW: 13.6 % (ref 11.5–14.5)
WBC: 5.5 10*3/uL (ref 3.6–11.0)

## 2016-01-12 LAB — TROPONIN I: Troponin I: <0.03 ng/mL (ref <0.031)

## 2016-01-12 LAB — FIBRIN DERIVATIVES D-DIMER (ARMC ONLY): Fibrin derivatives D-dimer (ARMC): 634 — ABNORMAL HIGH (ref 0–499)

## 2016-01-12 MED ORDER — IOPAMIDOL (ISOVUE-370) INJECTION 76%
75.0000 mL | Freq: Once | INTRAVENOUS | Status: AC | PRN
  Administered 2016-01-12: 75 mL via INTRAVENOUS

## 2016-01-12 MED ORDER — DEXTROSE 5 % IV SOLN
1.0000 g | Freq: Once | INTRAVENOUS | Status: AC
  Administered 2016-01-12: 1 g via INTRAVENOUS
  Filled 2016-01-12 (×2): qty 10

## 2016-01-12 MED ORDER — AZITHROMYCIN 500 MG PO TABS
500.0000 mg | ORAL_TABLET | Freq: Once | ORAL | Status: AC
  Administered 2016-01-12: 500 mg via ORAL
  Filled 2016-01-12: qty 1

## 2016-01-12 MED ORDER — LEVOFLOXACIN 750 MG PO TABS: 750.0000 mg | ORAL_TABLET | Freq: Every day | ORAL | Status: AC

## 2016-01-12 MED ORDER — IBUPROFEN 600 MG PO TABS
600.0000 mg | ORAL_TABLET | Freq: Once | ORAL | Status: AC
  Administered 2016-01-12: 600 mg via ORAL
  Filled 2016-01-12: qty 1

## 2016-01-12 NOTE — ED Notes (Signed)
Pt from home with c/o chest pain radiating to lower back with SOB and lightheadedness.  Pt reports cough for the last 3-4 days pain worse with deep inhalation.  Pt denies N/V, reports last treatment with Tylenol cold at 1600 yesterday.

## 2016-01-12 NOTE — Discharge Instructions (Signed)
Community-Acquired Pneumonia, Adult °Pneumonia is an infection of the lungs. There are different types of pneumonia. One type can develop while a person is in a hospital. A different type, called community-acquired pneumonia, develops in people who are not, or have not recently been, in the hospital or other health care facility.  °CAUSES °Pneumonia may be caused by bacteria, viruses, or funguses. Community-acquired pneumonia is often caused by Streptococcus pneumonia bacteria. These bacteria are often passed from one person to another by breathing in droplets from the cough or sneeze of an infected person. °RISK FACTORS °The condition is more likely to develop in: °· People who have chronic diseases, such as chronic obstructive pulmonary disease (COPD), asthma, congestive heart failure, cystic fibrosis, diabetes, or kidney disease. °· People who have early-stage or late-stage HIV. °· People who have sickle cell disease. °· People who have had their spleen removed (splenectomy). °· People who have poor dental hygiene. °· People who have medical conditions that increase the risk of breathing in (aspirating) secretions their own mouth and nose.   °· People who have a weakened immune system (immunocompromised). °· People who smoke. °· People who travel to areas where pneumonia-causing germs commonly exist. °· People who are around animal habitats or animals that have pneumonia-causing germs, including birds, bats, rabbits, cats, and farm animals. °SYMPTOMS °Symptoms of this condition include: °· A dry cough. °· A wet (productive) cough. °· Fever. °· Sweating. °· Chest pain, especially when breathing deeply or coughing. °· Rapid breathing or difficulty breathing. °· Shortness of breath. °· Shaking chills. °· Fatigue. °· Muscle aches. °DIAGNOSIS °Your health care provider will take a medical history and perform a physical exam. You may also have other tests, including: °· Imaging studies of your chest, including  X-rays. °· Tests to check your blood oxygen level and other blood gases. °· Other tests on blood, mucus (sputum), fluid around your lungs (pleural fluid), and urine. °If your pneumonia is severe, other tests may be done to identify the specific cause of your illness. °TREATMENT °The type of treatment that you receive depends on many factors, such as the cause of your pneumonia, the medicines you take, and other medical conditions that you have. For most adults, treatment and recovery from pneumonia may occur at home. In some cases, treatment must happen in a hospital. Treatment may include: °· Antibiotic medicines, if the pneumonia was caused by bacteria. °· Antiviral medicines, if the pneumonia was caused by a virus. °· Medicines that are given by mouth or through an IV tube. °· Oxygen. °· Respiratory therapy. °Although rare, treating severe pneumonia may include: °· Mechanical ventilation. This is done if you are not breathing well on your own and you cannot maintain a safe blood oxygen level. °· Thoracentesis. This procedure removes fluid around one lung or both lungs to help you breathe better. °HOME CARE INSTRUCTIONS °· Take over-the-counter and prescription medicines only as told by your health care provider. °¨ Only take cough medicine if you are losing sleep. Understand that cough medicine can prevent your body's natural ability to remove mucus from your lungs. °¨ If you were prescribed an antibiotic medicine, take it as told by your health care provider. Do not stop taking the antibiotic even if you start to feel better. °· Sleep in a semi-upright position at night. Try sleeping in a reclining chair, or place a few pillows under your head. °· Do not use tobacco products, including cigarettes, chewing tobacco, and e-cigarettes. If you need help quitting, ask your health care provider. °· Drink enough water to keep your urine   clear or pale yellow. This will help to thin out mucus secretions in your  lungs. PREVENTION There are ways that you can decrease your risk of developing community-acquired pneumonia. Consider getting a pneumococcal vaccine if:  You are older than 69 years of age.  You are older than 69 years of age and are undergoing cancer treatment, have chronic lung disease, or have other medical conditions that affect your immune system. Ask your health care provider if this applies to you. There are different types and schedules of pneumococcal vaccines. Ask your health care provider which vaccination option is best for you. You may also prevent community-acquired pneumonia if you take these actions:  Get an influenza vaccine every year. Ask your health care provider which type of influenza vaccine is best for you.  Go to the dentist on a regular basis.  Wash your hands often. Use hand sanitizer if soap and water are not available. SEEK MEDICAL CARE IF:  You have a fever.  You are losing sleep because you cannot control your cough with cough medicine. SEEK IMMEDIATE MEDICAL CARE IF:  You have worsening shortness of breath.  You have increased chest pain.  Your sickness becomes worse, especially if you are an older adult or have a weakened immune system.  You cough up blood.   This information is not intended to replace advice given to you by your health care provider. Make sure you discuss any questions you have with your health care provider.   Document Released: 08/24/2005 Document Revised: 05/15/2015 Document Reviewed: 12/19/2014 Elsevier Interactive Patient Education 2016 Elsevier Inc[employer]  Nonspecific Chest Pain It is often hard to find the cause of chest pain. There is always a chance that your pain could be related to something serious, such as a heart attack or a blood clot in your lungs. Chest pain can also be caused by conditions that are not life-threatening. If you have chest pain, it is very important to follow up with your doctor.  HOME CARE  If you  were prescribed an antibiotic medicine, finish it all even if you start to feel better.  Avoid any activities that cause chest pain.  Do not use any tobacco products, including cigarettes, chewing tobacco, or electronic cigarettes. If you need help quitting, ask your doctor.  Do not drink alcohol.  Take medicines only as told by your doctor.  Keep all follow-up visits as told by your doctor. This is important. This includes any further testing if your chest pain does not go away.  Your doctor may tell you to keep your head raised (elevated) while you sleep.  Make lifestyle changes as told by your doctor. These may include:  Getting regular exercise. Ask your doctor to suggest some activities that are safe for you.  Eating a heart-healthy diet. Your doctor or a diet specialist (dietitian) can help you to learn healthy eating options.  Maintaining a healthy weight.  Managing diabetes, if necessary.  Reducing stress. GET HELP IF:  Your chest pain does not go away, even after treatment.  You have a rash with blisters on your chest.  You have a fever. GET HELP RIGHT AWAY IF:  Your chest pain is worse.  You have an increasing cough, or you cough up blood.  You have severe belly (abdominal) pain.  You feel extremely weak.  You pass out (faint).  You have chills.  You have sudden, unexplained chest discomfort.  You have sudden, unexplained discomfort in your arms, back, neck, or  jaw.  You have shortness of breath at any time.  You suddenly start to sweat, or your skin gets clammy.  You feel nauseous.  You vomit.  You suddenly feel light-headed or dizzy.  Your heart begins to beat quickly, or it feels like it is skipping beats. These symptoms may be an emergency. Do not wait to see if the symptoms will go away. Get medical help right away. Call your local emergency services (911 in the [place] Do not drive yourself to the hospital.   This information is not  intended to replace advice given to you by your health care provider. Make sure you discuss any questions you have with your health care provider.   Document Released: 02/10/2008 Document Revised: 09/14/2014 Document Reviewed: 03/30/2014 Elsevier Interactive Patient Education 2016 Elsevier Inc.  Shortness of Breath Shortness of breath means you have trouble breathing. Shortness of breath needs medical care right away. HOME CARE   Do not smoke.  Avoid being around chemicals or things (paint fumes, dust) that may bother your breathing.  Rest as needed. Slowly begin your normal activities.  Only take medicines as told by your doctor.  Keep all doctor visits as told. GET HELP RIGHT AWAY IF:   Your shortness of breath gets worse.  You feel lightheaded, pass out (faint), or have a cough that is not helped by medicine.  You cough up blood.  You have pain with breathing.  You have pain in your chest, arms, shoulders, or belly (abdomen).  You have a fever.  You cannot walk up stairs or exercise the way you normally do.  You do not get better in the time expected.  You have a hard time doing normal activities even with rest.  You have problems with your medicines.  You have any new symptoms. MAKE SURE YOU:  Understand these instructions.  Will watch your condition.  Will get help right away if you are not doing well or get worse.   This information is not intended to replace advice given to you by your health care provider. Make sure you discuss any questions you have with your health care provider.   Document Released: 02/10/2008 Document Revised: 08/29/2013 Document Reviewed: 11/09/2011 Elsevier Interactive Patient Education [employer].

## 2016-01-12 NOTE — ED Notes (Signed)
Patient transported to X-ray 

## 2016-01-12 NOTE — ED Notes (Signed)
Pt alert and oriented X4, active, cooperative, pt in NAD. RR even and unlabored, color WNL.  Pt informed to return if any life threatening symptoms occur.   

## 2016-01-12 NOTE — ED Provider Notes (Signed)
Asked by Dr. Dahlia Client to follow up on troponin and if normal discharge the patient after IV antibiotics. Troponin is normal.   Lavonia Drafts, MD 01/12/16 [phone]

## 2016-01-12 NOTE — ED Notes (Signed)
Pt will be discharged when IV antibiotics are completed.

## 2016-01-12 NOTE — ED Provider Notes (Signed)
[facility] Emergency Department Provider Note   ____________________________________________  Time seen: Approximately 456 AM  I have reviewed the triage vital signs and the nursing notes.   HISTORY  Chief Complaint Chest Pain    HPI Cynthia Griffin is a 70 y.o. female who comes into the hospital today with back pain, weakness and shortness of breath. The patient reports that she has left lower back pain has been going on for several weeks. She initially thought it was a sprained muscle and then using ice and heat but has not been helping. She's been using either Profen as well. She said today that the pain was much worse. She had some shortness of breath that started earlier today as well. She wasn't doing anything out of the ordinary but it just started. She felt as though she couldn't take a deep breath because she have worse pain every time she took a deep breath in her chest. She denies any recent long trips or car rides. She rates her pain a 7 out of 10 in intensity. She's had a productive cough of clear phlegm. She thinks she had a temperature as well today that she feels hot. She denies any blood in her urine. She has pain in the left side of her chest that goes across the right side. The patient also having some generalized weakness. She was concerned about these symptoms so she decided to come into hospital to get evaluated.   Past Medical History  Diagnosis Date  . Arthritis   . Anxiety   . Carpal tunnel syndrome of right wrist   . Family history of anesthesia complication     pneumothorax - post op 1980's  . Breast cancer ([place]) 11/14/12    left, ER +, PR -, HER 2 -  . Pneumothorax on right March, 2014    while having port-a-cath placed  . Pneumonia   . Neuropathy ([place])     tips of fingers and left foot due to chemo  . Hx of radiation therapy 05/09/13-06/22/13    left breast 60.4Gy  . Anemia     hx    Patient Active Problem List   Diagnosis Date Noted  . Counseling regarding end of life decision making 10/24/2014  . Osteopenia 11/01/2013  . Arthralgia 07/05/2013  . 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 Malignant neoplasm of lower-inner quadrant of female breast ([place]) 03/14/2013  . Lymphedema left arm 02/17/2013  . Antineoplastic chemotherapy induced anemia(285.3) 02/01/2013  . Breast cancer, left breast ([place]) 12/16/2012    Past Surgical History  Procedure Laterality Date  . Appendectomy    . Tonsillectomy    . Meniscus repair      Right Knee  . Mastectomy w/ sentinel node biopsy Left 11/14/2012    Procedure: MASTECTOMY WITH SENTINEL LYMPH NODE BIOPSY;  Surgeon: Stark Klein, MD;  Location: [facility];  Service: General;  Laterality: Left;  . Axillary lymph node dissection Left 12/01/2012    Procedure: AXILLARY LYMPH NODE DISSECTION;  Surgeon: Stark Klein, MD;  Location: [facility];  Service: General;  Laterality: Left;  . Portacath placement Right 12/01/2012    Procedure: INSERTION PORT-A-CATH;  Surgeon: Stark Klein, MD;  Location: [facility];  Service: General;  Laterality: Right;  . Abdominal hysterectomy  1992    fibroids, total  . Mastectomy Left December 20, 2012  . Eye surgery Bilateral 2006    lasik surgery  .  Port-a-cath removal N/A 05/02/2013    Procedure: REMOVAL PORT-A-CATH;  Surgeon: Stark Klein, MD;  Location: [facility];  Service: General;  Laterality: N/A;  . Shoulder arthroscopy Right 1/15    rotator cuff repair  . Latissimus flap to breast Left 03/29/2014    dr Harlow Mares  . Latissimus flap to breast Left 03/29/2014    Procedure: LEFT LATISSIMUS FLAP;  Surgeon: Crissie Reese, MD;  Location: [facility];  Service: Plastics;  Laterality: Left;    Current Outpatient Rx  Name  Route  Sig  Dispense  Refill  . Biotin 1000 MCG tablet   Oral   Take 1,000 mcg by mouth daily.         . calcium citrate-vitamin D  (CITRACAL+D) 315-200 MG-UNIT per tablet   Oral   Take 1 tablet by mouth 2 (two) times daily.          . citalopram (CELEXA) 40 MG tablet      TAKE 1 TABLET BY MOUTH ONCE A DAY   90 tablet   0   . Multiple Vitamin (MULTIVITAMIN WITH MINERALS) TABS   Oral   Take 1 tablet by mouth daily.         . tamoxifen (NOLVADEX) 20 MG tablet   Oral   Take 1 tablet (20 mg total) by mouth daily.   90 tablet   3     Allergies Sulfa antibiotics  Family History  Problem Relation Age of Onset  . Bladder Cancer Father 24  . Lung cancer Father 10  . Diabetes Father   . Bone cancer Paternal Uncle     ? late 41s  . Diabetes Mother   . Peripheral vascular disease Mother   . Multiple sclerosis Sister   . Parkinson's disease Paternal Grandmother   . Heart disease Paternal Grandfather     Social History Social History  Substance Use Topics  . Smoking status: Former Smoker -- 1.25 packs/day for 29 years    Types: Cigarettes    Quit date: 09/07/1992  . Smokeless tobacco: Never Used  . Alcohol Use: 4.2 oz/week    7 Glasses of wine per week    Review of Systems Constitutional: fever/chills Eyes: No visual changes. ENT: No sore throat. Cardiovascular:  chest pain. Respiratory:  shortness of breath. Gastrointestinal: No abdominal pain.  No nausea, no vomiting.  No diarrhea.  No constipation. Genitourinary: Negative for dysuria. Musculoskeletal: Negative for back pain. Skin: Negative for rash. Neurological: Negative for headaches, focal weakness or numbness.  10-point ROS otherwise negative.  ____________________________________________   PHYSICAL EXAM:  VITAL SIGNS: ED Triage Vitals  Enc Vitals Group     BP 01/12/16 0452 153/94 mmHg     Pulse Rate 01/12/16 0452 88     Resp 01/12/16 0452 15     Temp 01/12/16 0452 98.9 F (37.2 C)     Temp Source 01/12/16 0452 Oral     SpO2 01/12/16 0452 96 %     Weight 01/12/16 0452 155 lb (70.308 kg)     Height 01/12/16 0452 5\' 4"   (1.626 m)     Head Cir --      Peak Flow --      Pain Score 01/12/16 0455 7     Pain Loc --      Pain Edu? --      Excl. in [place]? --     Constitutional: Alert and oriented. Well appearing and in moderate distress. Eyes: Conjunctivae are normal. PERRL. EOMI. Head: Atraumatic. Nose:  No congestion/rhinnorhea. Mouth/Throat: Mucous membranes are moist.  Oropharynx non-erythematous. Cardiovascular: Normal rate, regular rhythm. Grossly normal heart sounds.  Good peripheral circulation. Respiratory: Normal respiratory effort.  No retractions. Lungs CTAB. Gastrointestinal: Soft and nontender. No distention. Positive bowel sounds Musculoskeletal: No lower extremity tenderness nor edema.   Neurologic:  Normal speech and language.  Skin:  Skin is warm, dry and intact. Psychiatric: Mood and affect are normal.   ____________________________________________   LABS (all labs ordered are listed, but only abnormal results are displayed)  Labs Reviewed  BASIC METABOLIC PANEL - Abnormal; Notable for the following:    Glucose, Bld 128 (*)    All other components within normal limits  CBC - Abnormal; Notable for the following:    Hemoglobin 11.9 (*)    All other components within normal limits  FIBRIN DERIVATIVES D-DIMER ([facility] ONLY) - Abnormal; Notable for the following:    Fibrin derivatives D-dimer ([facility]) 634 (*)    All other components within normal limits  URINALYSIS COMPLETEWITH MICROSCOPIC ([facility] ONLY) - Abnormal; Notable for the following:    Color, Urine YELLOW (*)    APPearance CLEAR (*)    Bacteria, UA RARE (*)    Squamous Epithelial / LPF 0-5 (*)    All other components within normal limits  TROPONIN I  TROPONIN I   ____________________________________________  EKG  ED ECG REPORT I, Loney Hering, the attending physician, personally viewed and interpreted this ECG.   Date: 01/12/2016  EKG Time: 450  Rate: 456  Rhythm: normal sinus rhythm  Axis: normal   Intervals:none  ST&T Change: none  ____________________________________________  RADIOLOGY  CXR: No evidence of active pulmonary disease  CT angio chest: No demonstrable pulmonary embolus, evidence of pneumonia in the left lower lobe primarily superior segment region, semisolid opacity in the superior segment right lower lobe suspect focal area of pneumonia although somewhat atypical appearing neoplasm is a differential consideration in this region. ____________________________________________   PROCEDURES  Procedure(s) performed: None  Critical Care performed: No  ____________________________________________   INITIAL IMPRESSION / ASSESSMENT AND PLAN / ED COURSE  Pertinent labs & imaging results that were available during my care of the patient were reviewed by me and considered in my medical decision making (see chart for details).  This is a 70 year old female who comes into the hospital today with some back pain, chest pain and weakness. She is also been feeling short of breath with a cough. According to the CT it appears as though she may have a pulmonary embolus. I will give the patient some antibiotics and reassess her symptoms.  It appears as though the patient has pneumonia on her chest CT. I will repeat the patient's troponin and give her a dose of ceftriaxone and azithromycin. The patient's care was signed out to Dr. Corky Downs who will follow-up the results of the repeat troponin and disposition the patient. ____________________________________________   FINAL CLINICAL IMPRESSION(S) / ED DIAGNOSES  Final diagnoses:  Community acquired pneumonia  Chest pain, unspecified chest pain type  Shortness of breath      NEW MEDICATIONS STARTED DURING THIS VISIT:  New Prescriptions   No medications on file     Note:  This document was prepared using Dragon voice recognition software and may include unintentional dictation errors.    Loney Hering, MD 01/12/16  [phone]

## 2016-01-12 NOTE — ED Notes (Signed)
Pharmacy called for IV antibiotics to be sent, out of them in pyxis.

## 2016-01-15 ENCOUNTER — Other Ambulatory Visit: Payer: Self-pay | Admitting: Oncology

## 2016-01-29 DIAGNOSIS — H2512 Age-related nuclear cataract, left eye: Secondary | ICD-10-CM | POA: Diagnosis not present

## 2016-01-29 DIAGNOSIS — H5703 Miosis: Secondary | ICD-10-CM | POA: Diagnosis not present

## 2016-02-02 ENCOUNTER — Other Ambulatory Visit: Payer: Self-pay | Admitting: Family Medicine

## 2016-02-05 ENCOUNTER — Other Ambulatory Visit: Payer: Self-pay | Admitting: *Deleted

## 2016-02-05 DIAGNOSIS — K219 Gastro-esophageal reflux disease without esophagitis: Secondary | ICD-10-CM

## 2016-02-05 MED ORDER — RANITIDINE HCL 150 MG PO TABS
150.0000 mg | ORAL_TABLET | Freq: Two times a day (BID) | ORAL | Status: DC
Start: 2016-02-05 — End: 2018-01-03

## 2016-02-24 ENCOUNTER — Ambulatory Visit: Payer: Self-pay | Admitting: Internal Medicine

## 2016-03-02 ENCOUNTER — Encounter: Payer: Self-pay | Admitting: Internal Medicine

## 2016-03-02 ENCOUNTER — Ambulatory Visit (INDEPENDENT_AMBULATORY_CARE_PROVIDER_SITE_OTHER): Payer: Medicare Other | Admitting: Internal Medicine

## 2016-03-02 VITALS — BP 137/73 | HR 74 | Temp 98.9°F | Ht 70.0 in | Wt 204.2 lb

## 2016-03-02 DIAGNOSIS — Z87891 Personal history of nicotine dependence: Secondary | ICD-10-CM | POA: Diagnosis not present

## 2016-03-02 DIAGNOSIS — I10 Essential (primary) hypertension: Secondary | ICD-10-CM | POA: Diagnosis not present

## 2016-03-02 DIAGNOSIS — Z794 Long term (current) use of insulin: Secondary | ICD-10-CM

## 2016-03-02 DIAGNOSIS — I2510 Atherosclerotic heart disease of native coronary artery without angina pectoris: Secondary | ICD-10-CM

## 2016-03-02 DIAGNOSIS — E119 Type 2 diabetes mellitus without complications: Secondary | ICD-10-CM

## 2016-03-02 DIAGNOSIS — Z7982 Long term (current) use of aspirin: Secondary | ICD-10-CM | POA: Diagnosis not present

## 2016-03-02 DIAGNOSIS — I25119 Atherosclerotic heart disease of native coronary artery with unspecified angina pectoris: Secondary | ICD-10-CM

## 2016-03-02 DIAGNOSIS — IMO0001 Reserved for inherently not codable concepts without codable children: Secondary | ICD-10-CM

## 2016-03-02 DIAGNOSIS — E1165 Type 2 diabetes mellitus with hyperglycemia: Secondary | ICD-10-CM

## 2016-03-02 LAB — POCT GLYCOSYLATED HEMOGLOBIN (HGB A1C): Hemoglobin A1C: 7.8

## 2016-03-02 LAB — GLUCOSE, CAPILLARY: GLUCOSE-CAPILLARY: 125 mg/dL — AB (ref 65–99)

## 2016-03-02 MED ORDER — TRAMADOL HCL 50 MG PO TABS: 50.0000 mg | ORAL_TABLET | Freq: Three times a day (TID) | ORAL | Status: DC | PRN

## 2016-03-02 NOTE — Assessment & Plan Note (Signed)
At goal, continue current medications

## 2016-03-02 NOTE — Assessment & Plan Note (Signed)
A1c today of 7.8 above her goal of 7.   Will not change medications today, stressed importance of adherence and low carb diet.

## 2016-03-02 NOTE — Assessment & Plan Note (Addendum)
coronary atherosclerosis is noted on previous chest CT however I cannot find a previous stress test or cardiac cath.  She is high risk given DM, HTN, and schizophrenia on antipsychotics.  I feel her chest pain is more likely MSK and relieved by Tramadol but she does need further cardiac workup, will place referral to cardiology for consideration of stress testing/cardiac cath. Otherwise she is on appropriate medications for medical management of CAD

## 2016-03-02 NOTE — Patient Instructions (Signed)
General Instructions:  Keep up the good work with your medications,  I am going to send you over the to cardiologist to be seen about your chest pain.  Please bring your medicines with you each time you come to clinic.  Medicines may include prescription medications, over-the-counter medications, herbal remedies, eye drops, vitamins, or other pills.   Progress Toward Treatment Goals:  Treatment Goal 06/20/2014  Hemoglobin A1C deteriorated  Blood pressure at goal    Self Care Goals & Plans:  Self Care Goal 09/11/2015  Manage my medications take my medicines as prescribed; bring my medications to every visit; refill my medications on time; follow the sick day instructions if I am sick  Monitor my health keep track of my blood glucose; keep track of my blood pressure; check my feet daily  Eat healthy foods eat more vegetables; eat fruit for snacks and desserts; eat baked foods instead of fried foods; eat smaller portions; drink diet soda or water instead of juice or soda  Be physically active find an activity I enjoy    Home Blood Glucose Monitoring 06/20/2014  Check my blood sugar 2 times a day  When to check my blood sugar -     Care Management & Community Referrals:  Referral 06/20/2014  Referrals made for care management support none needed

## 2016-03-02 NOTE — Progress Notes (Signed)
[facility] Subjective:   Patient ID: [name] female   DOB: 1945-12-29 70 y.o.   MRN: [id number]  HPI: [name] is a 70 y.o. female with a PMH detailed below who presents for medication refills.  She reports that she takes tramadol for "angina."  She is a poor historian but her daughter Adela Lank is also with her and does confirm that she takes tramadol for angina chest pain.  She notes that doctors had previously done other tests but eventually figured out that tramadol works well for her chronic chest pain.  She describes the pain as worsening with exertion, but seems confused by my questions of how quickly it passes after resting.  She pain is constantly at the same place in the center of her chest, it does not radiate.  For her diabetes and HTN they both report she is doing a good job and takes her medications as directed.  I noted a letter for [facility] recently about inconsistent fill but they assured me that she is taking the medication as directed.  Past Medical History  Diagnosis Date  . Diabetes mellitus   . GERD (gastroesophageal reflux disease)   . Hyperlipidemia   . Hypertension   . Schizophrenia ([place])   . Atypical nevus   . History of mitral valve prolapse   . Breast cancer ([place]) 07/14/06    Left breast. Ductal carcinoma, grade 3. Followed by Dr. Truddie Coco. s/p lumpectomy with radiation  . Goiter     U/S 08/2009-persistent multiple small nodules, one in the right lower pole slightly enlarged, recommend continued followup.  . Ovarian cyst   . Fibroids   . DVT (deep vein thrombosis) in pregnancy 1982    Affected the RLE. Required coumadin.  . Dental caries    Current Outpatient Prescriptions  Medication Sig Dispense Refill  . aspirin 81 MG tablet Take 1 tablet (81 mg total) by mouth daily. 30 tablet 11  . calcium-vitamin D (OSCAL 500/200 D-3) 500-200 MG-UNIT per tablet Take 1 tablet by mouth 3 (three) times daily. 90 tablet 11  . Insulin  Detemir (LEVEMIR FLEXPEN) 100 UNIT/ML Pen Inject 35 Units into the skin daily at 10 pm. 15 mL 11  . lisinopril (PRINIVIL,ZESTRIL) 20 MG tablet Take 1 tablet (20 mg total) by mouth daily. 30 tablet 11  . metFORMIN (GLUCOPHAGE) 1000 MG tablet Take 1 tablet (1,000 mg total) by mouth 2 (two) times daily with a meal. 60 tablet 11  . metoprolol (LOPRESSOR) 50 MG tablet TAKE TWO TABLETS BY MOUTH TWICE DAILY 360 tablet 3  . OLANZapine (ZYPREXA) 10 MG tablet Take 30 mg by mouth at bedtime.     . polyethylene glycol (MIRALAX / GLYCOLAX) packet Take 17 g by mouth daily. 14 each 0  . pravastatin (PRAVACHOL) 40 MG tablet Take 1 tablet (40 mg total) by mouth daily. 30 tablet 11  . QUEtiapine (SEROQUEL) 50 MG tablet Take 150 mg by mouth at bedtime.    . ranitidine (ZANTAC) 150 MG tablet Take 1 tablet (150 mg total) by mouth 2 (two) times daily. 180 tablet 3  . traMADol (ULTRAM) 50 MG tablet Take 1 tablet (50 mg total) by mouth every 8 (eight) hours as needed for severe pain. Do not fill until 30 days after last fill 120 tablet 5   No current facility-administered medications for this visit.   Family History  Problem Relation Age of Onset  . Diabetes Mother   . Diabetes Father  Social History   Social History  . Marital Status: Widowed    Spouse Name: N/A  . Number of Children: N/A  . Years of Education: N/A   Social History Main Topics  . Smoking status: Former Research scientist (life sciences)  . Smokeless tobacco: Never Used     Comment: quit 9 yrs ago.  . Alcohol Use: No  . Drug Use: No  . Sexual Activity: Not on file   Other Topics Concern  . Not on file   Social History Narrative   Widow.   Used to work as a Quarry manager, now retired.         Review of Systems: Review of Systems  Constitutional: Negative for fever.  Eyes: Negative for blurred vision.  Respiratory: Negative for cough.   Cardiovascular: Positive for chest pain. Negative for orthopnea, claudication, leg swelling and PND.  Genitourinary: Negative  for dysuria and frequency.  Musculoskeletal: Negative for back pain and falls.  Endo/Heme/Allergies: Negative for polydipsia.     Objective:  Physical Exam: Filed Vitals:   03/02/16 1111  BP: 137/73  Pulse: 74  Temp: 98.9 F (37.2 C)  TempSrc: Oral  Height: 5\' 10"  (1.778 m)  Weight: 204 lb 3.2 oz (92.625 kg)  SpO2: 100%  Physical Exam  Constitutional: She is well-developed, well-nourished, and in no distress.  HENT:  Head: Normocephalic and atraumatic.  Cardiovascular: Normal rate, regular rhythm and normal heart sounds.   Pulmonary/Chest: Effort normal. No respiratory distress. She has no wheezes. She exhibits tenderness.  Abdominal: Soft. Bowel sounds are normal.  Musculoskeletal: She exhibits no edema.       Arms: Nursing note and vitals reviewed.   Assessment & Plan:  Case discussed with Dr. Lynnae January  Essential hypertension At goal, continue current medications  Coronary atherosclerosis of native coronary artery coronary atherosclerosis is noted on previous chest CT however I cannot find a previous stress test or cardiac cath.  She is high risk given DM, HTN, and schizophrenia on antipsychotics.  I feel her chest pain is more likely MSK and relieved by Tramadol but she does need further cardiac workup, will place referral to cardiology for consideration of stress testing/cardiac cath. Otherwise she is on appropriate medications for medical management of CAD  Diabetes type 2, uncontrolled A1c today of 7.8 above her goal of 7.   Will not change medications today, stressed importance of adherence and low carb diet.    Medications Ordered Meds ordered this encounter  Medications  . traMADol (ULTRAM) 50 MG tablet    Sig: Take 1 tablet (50 mg total) by mouth every 8 (eight) hours as needed for severe pain. Do not fill until 30 days after last fill    Dispense:  120 tablet    Refill:  1   Other Orders Orders Placed This Encounter  Procedures  . Glucose, capillary  .  Ambulatory referral to Cardiology    Referral Priority:  Routine    Referral Type:  Consultation    Referral Reason:  Specialty Services Required    Requested Specialty:  Cardiology    Number of Visits Requested:  1  . POCT HgB A1C (CPT [phone])   Follow Up: Return in about 2 months (around 05/02/2016).

## 2016-03-03 NOTE — Progress Notes (Signed)
Internal Medicine Clinic Attending  Case discussed with Dr. Hoffman soon after the resident saw the patient.  We reviewed the resident's history and exam and pertinent patient test results.  I agree with the assessment, diagnosis, and plan of care documented in the resident's note. 

## 2016-03-09 ENCOUNTER — Encounter: Payer: Self-pay | Admitting: Internal Medicine

## 2016-03-17 DIAGNOSIS — C50512 Malignant neoplasm of lower-outer quadrant of left female breast: Secondary | ICD-10-CM | POA: Diagnosis not present

## 2016-03-25 DIAGNOSIS — F209 Schizophrenia, unspecified: Secondary | ICD-10-CM | POA: Diagnosis not present

## 2016-04-14 ENCOUNTER — Ambulatory Visit: Payer: Self-pay | Admitting: Cardiology

## 2016-05-02 ENCOUNTER — Other Ambulatory Visit: Payer: Self-pay | Admitting: Internal Medicine

## 2016-05-02 DIAGNOSIS — IMO0002 Reserved for concepts with insufficient information to code with codable children: Secondary | ICD-10-CM

## 2016-05-02 DIAGNOSIS — E1165 Type 2 diabetes mellitus with hyperglycemia: Secondary | ICD-10-CM

## 2016-05-06 ENCOUNTER — Encounter: Payer: Self-pay | Admitting: Internal Medicine

## 2016-05-27 ENCOUNTER — Ambulatory Visit: Payer: Medicare Other | Admitting: Cardiology

## 2016-05-29 ENCOUNTER — Encounter: Payer: Self-pay | Admitting: Cardiology

## 2016-06-02 NOTE — Addendum Note (Signed)
Addended by: Hulan Fray on: 06/02/2016 08:11 PM   Modules accepted: Orders

## 2016-06-03 DIAGNOSIS — Z23 Encounter for immunization: Secondary | ICD-10-CM | POA: Diagnosis not present

## 2016-06-26 ENCOUNTER — Other Ambulatory Visit: Payer: Self-pay

## 2016-06-26 DIAGNOSIS — I25110 Atherosclerotic heart disease of native coronary artery with unstable angina pectoris: Secondary | ICD-10-CM

## 2016-06-26 NOTE — Telephone Encounter (Signed)
pravastatin (PRAVACHOL) 40 MG tablet, refill request @ [facility] on [address].

## 2016-06-30 MED ORDER — PRAVASTATIN SODIUM 40 MG PO TABS: 40.0000 mg | ORAL_TABLET | Freq: Every day | ORAL | 3 refills | Status: DC

## 2016-07-03 DIAGNOSIS — F209 Schizophrenia, unspecified: Secondary | ICD-10-CM | POA: Diagnosis not present

## 2016-07-07 ENCOUNTER — Telehealth: Payer: Self-pay | Admitting: *Deleted

## 2016-07-07 ENCOUNTER — Other Ambulatory Visit: Payer: Self-pay

## 2016-07-07 MED ORDER — TRAMADOL HCL 50 MG PO TABS: 50.0000 mg | ORAL_TABLET | Freq: Three times a day (TID) | ORAL | 0 refills | Status: DC | PRN

## 2016-07-07 NOTE — Telephone Encounter (Signed)
States pravastatin is not at the pharmacy. Please call back.

## 2016-07-08 ENCOUNTER — Encounter: Payer: Self-pay | Admitting: Internal Medicine

## 2016-07-08 NOTE — Telephone Encounter (Signed)
Left on vmail at [facility]

## 2016-07-08 NOTE — Telephone Encounter (Signed)
Pravastatin rx was sent to [facility] - called pt to see if she has received it; if not to call us back.

## 2016-07-08 NOTE — Telephone Encounter (Signed)
Pt states tramadol is not at the pharmacy.

## 2016-07-08 NOTE — Telephone Encounter (Signed)
[facility] has filled the med

## 2016-07-09 ENCOUNTER — Other Ambulatory Visit: Payer: Self-pay | Admitting: *Deleted

## 2016-07-09 DIAGNOSIS — C50319 Malignant neoplasm of lower-inner quadrant of unspecified female breast: Secondary | ICD-10-CM

## 2016-07-10 ENCOUNTER — Other Ambulatory Visit (HOSPITAL_BASED_OUTPATIENT_CLINIC_OR_DEPARTMENT_OTHER): Payer: Medicare Other

## 2016-07-10 DIAGNOSIS — C50912 Malignant neoplasm of unspecified site of left female breast: Secondary | ICD-10-CM

## 2016-07-10 DIAGNOSIS — C50319 Malignant neoplasm of lower-inner quadrant of unspecified female breast: Secondary | ICD-10-CM

## 2016-07-10 LAB — COMPREHENSIVE METABOLIC PANEL
ALBUMIN: 3.4 g/dL — AB (ref 3.5–5.0)
ALK PHOS: 59 U/L (ref 40–150)
ALT: 17 U/L (ref 0–55)
ANION GAP: 8 meq/L (ref 3–11)
AST: 15 U/L (ref 5–34)
BILIRUBIN TOTAL: 0.34 mg/dL (ref 0.20–1.20)
BUN: 17.8 mg/dL (ref 7.0–26.0)
CALCIUM: 9.2 mg/dL (ref 8.4–10.4)
CO2: 27 meq/L (ref 22–29)
CREATININE: 0.9 mg/dL (ref 0.6–1.1)
Chloride: 106 mEq/L (ref 98–109)
EGFR: 64 mL/min/{1.73_m2} — AB (ref >90)
Glucose: 87 mg/dl (ref 70–140)
Potassium: 4.5 mEq/L (ref 3.5–5.1)
Sodium: 142 mEq/L (ref 136–145)
TOTAL PROTEIN: 6.8 g/dL (ref 6.4–8.3)

## 2016-07-10 LAB — CBC WITH DIFFERENTIAL/PLATELET
BASO%: 0.3 % (ref 0.0–2.0)
Basophils Absolute: 0 10*3/uL (ref 0.0–0.1)
EOS ABS: 0.1 10*3/uL (ref 0.0–0.5)
EOS%: 1.3 % (ref 0.0–7.0)
HEMATOCRIT: 36.8 % (ref 34.8–46.6)
HGB: 11.9 g/dL (ref 11.6–15.9)
LYMPH#: 2.1 10*3/uL (ref 0.9–3.3)
LYMPH%: 24.3 % (ref 14.0–49.7)
MCH: 27.9 pg (ref 25.1–34.0)
MCHC: 32.3 g/dL (ref 31.5–36.0)
MCV: 86.6 fL (ref 79.5–101.0)
MONO#: 0.5 10*3/uL (ref 0.1–0.9)
MONO%: 6.3 % (ref 0.0–14.0)
NEUT%: 67.8 % (ref 38.4–76.8)
NEUTROS ABS: 5.8 10*3/uL (ref 1.5–6.5)
PLATELETS: 246 10*3/uL (ref 145–400)
RBC: 4.25 10*6/uL (ref 3.70–5.45)
RDW: 13.9 % (ref 11.2–14.5)
WBC: 8.6 10*3/uL (ref 3.9–10.3)

## 2016-07-15 ENCOUNTER — Encounter: Payer: Self-pay | Admitting: Internal Medicine

## 2016-07-15 DIAGNOSIS — F209 Schizophrenia, unspecified: Secondary | ICD-10-CM | POA: Diagnosis not present

## 2016-07-16 NOTE — Progress Notes (Signed)
CLINIC:  Survivorship   REASON FOR VISIT:  Routine follow-up for history of breast cancer.   BRIEF ONCOLOGIC HISTORY:  (From Dr. Virgie Dad last note on 07/18/15)   INTERVAL HISTORY:  Cynthia Griffin presents to the [facility] today for routine follow-up for her history of breast cancer.  Overall, she reports feeling quite well. She remains on the tamoxifen with great tolerance. She has seldom/mild hot flashes that are manageable without intervention. She does have some arthralgias, particularly in her hands, which improved with activity during the day.  She has maintained her surveillance schedule of seeing her PCP, Dr. Barry Dienes, and has at the cancer center every few months. She saw her PCP in 10/2015; she saw Dr. Barry Dienes sometime over the summer; and she is seeing [place] at the cancer center today.  Regarding her breast/chest wall, she reports left chest wall. Dryness which is intermittent and annoying to her.  Her last DEXA scan was on 11/04/13 and showed osteopenia. She consumes probably of calcium in her diet, and takes vitamin D supplementation.  For exercise, she enjoys walking, yoga, and Pilates.   REVIEW OF SYSTEMS:  Review of Systems  Constitutional: Negative.   HENT:  Negative.   Eyes: Negative.   Respiratory: Negative.   Cardiovascular: Negative.   Gastrointestinal: Negative.   Endocrine: Positive for hot flashes.  Genitourinary: Negative.  Negative for vaginal bleeding.   Musculoskeletal: Positive for arthralgias.  Neurological: Negative.   Hematological: Negative.   Psychiatric/Behavioral: Negative.    Breast: Denies any new nodularity, masses, tenderness, nipple changes, or nipple discharge.  (L) chest wall pruritis.    A 14-point review of systems was completed and was negative, except as noted above.    PAST MEDICAL/SURGICAL HISTORY:  Past Medical History:  Diagnosis Date  . Anemia    hx  . Anxiety   . Arthritis   . Breast cancer ([place]) 11/14/12   left,  ER +, PR -, HER 2 -  . Carpal tunnel syndrome of right wrist   . Family history of anesthesia complication    pneumothorax - post op 1980's  . Hx of radiation therapy 05/09/13-06/22/13   left breast 60.4Gy  . Neuropathy ([place])    tips of fingers and left foot due to chemo  . Pneumonia   . Pneumothorax on right March, 2014   while having port-a-cath placed   Past Surgical History:  Procedure Laterality Date  . ABDOMINAL HYSTERECTOMY  1992   fibroids, total  . APPENDECTOMY    . AXILLARY LYMPH NODE DISSECTION Left 12/01/2012   Procedure: AXILLARY LYMPH NODE DISSECTION;  Surgeon: Stark Klein, MD;  Location: [place];  Service: General;  Laterality: Left;  . EYE SURGERY Bilateral 2006   lasik surgery  . LATISSIMUS FLAP TO BREAST Left 03/29/2014   dr Harlow Mares  . LATISSIMUS FLAP TO BREAST Left 03/29/2014   Procedure: LEFT LATISSIMUS FLAP;  Surgeon: Crissie Reese, MD;  Location: [place];  Service: Plastics;  Laterality: Left;  Marland Kitchen MASTECTOMY Left December 20, 2012  . MASTECTOMY W/ SENTINEL NODE BIOPSY Left 11/14/2012   Procedure: MASTECTOMY WITH SENTINEL LYMPH NODE BIOPSY;  Surgeon: Stark Klein, MD;  Location: [place];  Service: General;  Laterality: Left;  . MENISCUS REPAIR     Right Knee  . PORT-A-CATH REMOVAL N/A 05/02/2013   Procedure: REMOVAL PORT-A-CATH;  Surgeon: Stark Klein, MD;  Location: [place];  Service: General;  Laterality: N/A;  . PORTACATH PLACEMENT Right 12/01/2012   Procedure: INSERTION PORT-A-CATH;  Surgeon: Stark Klein, MD;  Location: [place];  Service: General;  Laterality: Right;  . SHOULDER ARTHROSCOPY Right 1/15   rotator cuff repair  . TONSILLECTOMY       ALLERGIES:  Allergies  Allergen Reactions  . Sulfa Antibiotics Nausea Only and Other (See Comments)    Stomach cramps     CURRENT MEDICATIONS:  Outpatient Encounter Prescriptions as of 07/17/2016  Medication Sig  . Biotin 1000 MCG tablet Take 1,000 mcg by mouth daily.  . calcium  citrate-vitamin D (CITRACAL+D) 315-200 MG-UNIT per tablet Take 1 tablet by mouth 2 (two) times daily.   . citalopram (CELEXA) 40 MG tablet TAKE 1 TABLET BY MOUTH ONCE A DAY  . Multiple Vitamin (MULTIVITAMIN WITH MINERALS) TABS Take 1 tablet by mouth daily.  . tamoxifen (NOLVADEX) 20 MG tablet TAKE 1 TABLET (20 MG TOTAL) BY MOUTH DAILY.   No facility-administered encounter medications on file as of 07/17/2016.      ONCOLOGIC FAMILY HISTORY:  Family History  Problem Relation Age of Onset  . Bladder Cancer Father 71  . Lung cancer Father 91  . Diabetes Father   . Bone cancer Paternal Uncle     ? late 45s  . Diabetes Mother   . Peripheral vascular disease Mother   . Multiple sclerosis Sister   . Parkinson's disease Paternal Grandmother   . Heart disease Paternal Grandfather     GENETIC COUNSELING/TESTING: 02/06/13-Patient had genetic counseling and decided not to pursue genetic testing.    SOCIAL HISTORY:  Cynthia Griffin is married and lives with her husband in [place], [place]. They have 4 adult children. They also have 6 grandchildren. Cynthia Griffin is retired. She does volunteer often with [employer]. She denies any current tobacco or illicit drug use.  She denies alcohol occasionally.   PHYSICAL EXAMINATION:  Vital Signs: Vitals:   07/17/16 0900  BP: (!) 134/58  Pulse: 71  Resp: 18  Temp: 98.2 F (36.8 C)   Filed Weights   07/17/16 0900  Weight: 158 lb 8 oz (71.9 kg)   General: Well-nourished, well-appearing female in no acute distress.  She is unaccompanied today.   HEENT: Head is normocephalic.  Pupils equal and reactive to light. Conjunctivae clear without exudate.  Sclerae anicteric. Oral mucosa is pink, moist.  Oropharynx is pink without lesions or erythema.  Lymph: No cervical, supraclavicular, or infraclavicular lymphadenopathy noted on palpation.  Cardiovascular: Regular rate and rhythm.Marland Kitchen Respiratory: Clear to auscultation bilaterally. Chest  expansion symmetric; breathing non-labored.  Breast Exam:  -Left chest wall: s/p mastectomy. No palpable nodularity or masses.  Healed mastectomy scar without erythema or nodularity.  There is no skin redness or associated rash to correspond with patient's complaint of pruritis.  -Right breast: No appreciable masses on palpation. No skin redness, thickening, or peau d'orange appearance; no nipple retraction or nipple discharge. -Axilla: No axillary adenopathy bilaterally.  GI: Abdomen soft and round; non-tender, non-distended. Bowel sounds normoactive. No hepatosplenomegaly.   GU: Deferred.  Neuro: No focal deficits. Steady gait.  Psych: Mood and affect normal and appropriate for situation.  Extremities: No edema. Skin: Warm and dry.  LABORATORY DATA:  CBC Latest Ref Rng & Units 07/10/2016 01/12/2016 07/11/2015  WBC 3.9 - 10.3 10e3/uL 8.6 5.5 6.2  Hemoglobin 11.6 - 15.9 g/dL 11.9 11.9(L) 11.9  Hematocrit 34.8 - 46.6 % 36.8 35.5 36.6  Platelets 145 - 400 10e3/uL 246 187 259   CMP Latest Ref Rng & Units 07/10/2016 01/12/2016  11/18/2015  Glucose 70 - 140 mg/dl 87 128(H) 93  BUN 7.0 - 26.0 mg/dL 17.8 16 10   Creatinine 0.6 - 1.1 mg/dL 0.9 0.81 0.89  Sodium 136 - 145 mEq/L 142 139 142  Potassium 3.5 - 5.1 mEq/L 4.5 3.8 3.9  Chloride 101 - 111 mmol/L - 106 105  CO2 22 - 29 mEq/L 27 23 29   Calcium 8.4 - 10.4 mg/dL 9.2 8.9 9.2  Total Protein 6.4 - 8.3 g/dL 6.8 - 6.6  Total Bilirubin 0.20 - 1.20 mg/dL 0.34 - 0.4  Alkaline Phos 40 - 150 U/L 59 - 44  AST 5 - 34 U/L 15 - 17  ALT 0 - 55 U/L 17 - 15   *Labs reviewed and are largely stable/within normal limits.     DIAGNOSTIC IMAGING:  Most recent mammogram: 11/05/15 [facility])     ASSESSMENT AND PLAN:  Ms.. Griffin is a pleasant 70 y.o. 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  She went on to complete left chest wall radiation therapy. She did have flap reconstruction surgery, but this was complicated by flap necrosis and she declined any further reconstruction procedures.  She started anti-estrogen therapy with Tamoxifen in 06/2013 with plans to continue x 10 years. She [facility] to the [facility] for surveillance and routine follow-up.   1. History of Stage IIIA left breast cancer:  Cynthia Griffin is currently clinically and radiographically without evidence of disease or recurrence of breast cancer. She will remain on the tamoxifen, as she continues to tolerate it quite well. The plan is to continue the tamoxifen for a total of 10 years, which she will complete in 06/2023. She will be due for right unilateral screening mammogram at [facility] in 10/2016; orders placed today. She will maintain her surveillance follow-ups with her PCP, Dr. Barry Dienes, and Dr. Jana Hakim so that she is seen regularly every 4 months or so. She will return to the cancer center to see Dr. Jana Hakim in 07/2017.    2. Left chest wall pruritis: There is no rash or other physical findings to correlate with the patient's reported left chest wall pruritus. This could certainly be secondary to her history of radiation and subsequent chronic dry skin.  I encouraged her to apply vitamin E oil or lotion with vitamin E, to help with the dry skin. We discussed that it also could be related to her current prostheses that she is wearing and her mastectomy bra causing chest wall irritation or diaphoresis contributing to the itching.  Encouraged her to let us know if her symptoms don't resolve, but I think they are likely benign.    3. Left breast prosthesis/mastectomy bra: I have faxed refill orders to [facility] for the patient to get additional supplies, when needed.  Encouraged her to let us know if there is  anything she needs from [place] regarding supplies.   4. Bone health:  Given Cynthia Griffin's age & history of breast cancer, she is at risk for bone demineralization. Her last DEXA scan was on 11/04/13 and showed osteopenia.  Therefore, she is due for biennial testing.  We decided to coordinate her upcoming mammogram with her DEXA scan appointment. Therefore she will have DEXA scan in 10/2016 at [facility] at the same visit for her mammogram.  In the  meantime, she was encouraged to increase her consumption of foods rich in calcium, as well as increase her weight-bearing activities.  She was given education on specific food and activities to promote bone health.  5. Health maintenance and wellness promotion: Cynthia Griffin was encouraged to consume 5-7 servings of fruits and vegetables per day. She was also encouraged to engage in moderate to vigorous exercise for 30 minutes per day most days of the week. She was instructed to limit her alcohol consumption and continue to abstain from tobacco use.    Dispo:   -Annual unilateral right breast mammogram due in 10/2016 at [facility]; orders placed today. -DEXA scan due; we'll coordinate with upcoming mammogram at [facility] in 10/2016; orders placed today. -Return to cancer center to see Dr. Jana Hakim in 07/2017.   A total of 20 minutes of face-to-face time was spent with this patient with greater than 50% of that time in counseling and care-coordination.   Mike Craze, NP Survivorship Program [facility] [phone]   Note: PRIMARY CARE PROVIDER Eliezer Lofts, [place] [phone]

## 2016-07-17 ENCOUNTER — Ambulatory Visit (HOSPITAL_BASED_OUTPATIENT_CLINIC_OR_DEPARTMENT_OTHER): Payer: Medicare Other | Admitting: Adult Health

## 2016-07-17 ENCOUNTER — Encounter: Payer: Self-pay | Admitting: Adult Health

## 2016-07-17 ENCOUNTER — Ambulatory Visit: Payer: Medicare Other | Admitting: Nurse Practitioner

## 2016-07-17 VITALS — BP 134/58 | HR 71 | Temp 98.2°F | Resp 18 | Ht 64.0 in | Wt 158.5 lb

## 2016-07-17 DIAGNOSIS — L299 Pruritus, unspecified: Secondary | ICD-10-CM

## 2016-07-17 DIAGNOSIS — Z170 Estrogen receptor positive status [ER+]: Principal | ICD-10-CM

## 2016-07-17 DIAGNOSIS — C50912 Malignant neoplasm of unspecified site of left female breast: Secondary | ICD-10-CM

## 2016-07-17 DIAGNOSIS — M8580 Other specified disorders of bone density and structure, unspecified site: Secondary | ICD-10-CM

## 2016-07-17 DIAGNOSIS — Z1231 Encounter for screening mammogram for malignant neoplasm of breast: Secondary | ICD-10-CM

## 2016-07-17 DIAGNOSIS — Z780 Asymptomatic menopausal state: Secondary | ICD-10-CM

## 2016-07-21 ENCOUNTER — Telehealth: Payer: Self-pay | Admitting: Internal Medicine

## 2016-07-21 NOTE — Telephone Encounter (Signed)
APT. REMINDER CALL, LMTCB °

## 2016-07-22 ENCOUNTER — Ambulatory Visit (INDEPENDENT_AMBULATORY_CARE_PROVIDER_SITE_OTHER): Payer: Medicare Other | Admitting: Internal Medicine

## 2016-07-22 ENCOUNTER — Encounter: Payer: Self-pay | Admitting: Internal Medicine

## 2016-07-22 VITALS — BP 155/75 | HR 76 | Temp 98.3°F | Wt 204.6 lb

## 2016-07-22 DIAGNOSIS — I25119 Atherosclerotic heart disease of native coronary artery with unspecified angina pectoris: Secondary | ICD-10-CM

## 2016-07-22 DIAGNOSIS — K219 Gastro-esophageal reflux disease without esophagitis: Secondary | ICD-10-CM | POA: Diagnosis not present

## 2016-07-22 DIAGNOSIS — IMO0001 Reserved for inherently not codable concepts without codable children: Secondary | ICD-10-CM

## 2016-07-22 DIAGNOSIS — Z7982 Long term (current) use of aspirin: Secondary | ICD-10-CM | POA: Diagnosis not present

## 2016-07-22 DIAGNOSIS — Z79899 Other long term (current) drug therapy: Secondary | ICD-10-CM

## 2016-07-22 DIAGNOSIS — Z794 Long term (current) use of insulin: Secondary | ICD-10-CM | POA: Diagnosis not present

## 2016-07-22 DIAGNOSIS — E119 Type 2 diabetes mellitus without complications: Secondary | ICD-10-CM | POA: Diagnosis not present

## 2016-07-22 DIAGNOSIS — R931 Abnormal findings on diagnostic imaging of heart and coronary circulation: Secondary | ICD-10-CM

## 2016-07-22 DIAGNOSIS — Z0000 Encounter for general adult medical examination without abnormal findings: Secondary | ICD-10-CM

## 2016-07-22 DIAGNOSIS — I10 Essential (primary) hypertension: Secondary | ICD-10-CM | POA: Diagnosis not present

## 2016-07-22 DIAGNOSIS — Z87891 Personal history of nicotine dependence: Secondary | ICD-10-CM

## 2016-07-22 DIAGNOSIS — E1165 Type 2 diabetes mellitus with hyperglycemia: Principal | ICD-10-CM

## 2016-07-22 LAB — GLUCOSE, CAPILLARY: GLUCOSE-CAPILLARY: 115 mg/dL — AB (ref 65–99)

## 2016-07-22 LAB — POCT GLYCOSYLATED HEMOGLOBIN (HGB A1C): HEMOGLOBIN A1C: 7.3

## 2016-07-22 MED ORDER — TRAMADOL HCL 50 MG PO TABS: 50.0000 mg | ORAL_TABLET | Freq: Three times a day (TID) | ORAL | 5 refills | Status: DC | PRN

## 2016-07-22 MED ORDER — LISINOPRIL 40 MG PO TABS: 40.0000 mg | ORAL_TABLET | Freq: Every day | ORAL | 3 refills | Status: DC

## 2016-07-22 NOTE — Assessment & Plan Note (Signed)
Previous CT chest noted CAD; however, this has not been confirmed with Cath or correlated with stress test. Patient has intermittent chest pain brought on by lifting heavy objects or certain movements and relieved with rest and tramadol. Although atypical for angina, patient was referred to cardiology for a stress test. Patient states she does not want to see cardiology or have a stress test. Given her symptoms are stable, there really is not a role for PCI at this point. She is on appropriate therapy with ASA, statin and a BB. She was counseled on concerning symptoms that should prompt her to go to the ED.  Plan: -Continue current regimen

## 2016-07-22 NOTE — Patient Instructions (Addendum)
Ms. [name],  Please take Lisinopril 40 mg once a day for blood pressure. You can take 2 of the 20 mg tablets until you run out. Continue your metoprolol 100 mg twice a day as well. Please continue taking all other medications as prescribed. Your diabetes is doing well. Please follow up in 3 months.

## 2016-07-22 NOTE — Assessment & Plan Note (Signed)
BP Readings from Last 3 Encounters:  07/22/16 (!) 155/75  03/02/16 137/73  09/11/15 (!) 171/77    Lab Results  Component Value Date   NA 141 10/12/2014   K 4.1 10/12/2014   CREATININE 0.74 06/03/2015    Assessment: Blood pressure control:  Uncontrolled Progress toward BP goal:   Above goal Comments: compliant with metoprolol 100 mg BID and lisinopril 20 mg daily  Plan: Medications:  Increase lisinopril to 40 mg daily and continue metoprolol 100 mg BID Other plans: Follow up in 3 months

## 2016-07-22 NOTE — Assessment & Plan Note (Signed)
Mammogram: Normal January 2107, needs repeat January 2018 Colon Cancer Screening: FOBT negative 3/17, needs repeat March 2018 Influenza and Pneumococcal Vaccination: Patient refused

## 2016-07-22 NOTE — Addendum Note (Signed)
Addended by: Martyn Malay R on: 07/22/2016 01:08 PM   Modules accepted: Orders

## 2016-07-22 NOTE — Assessment & Plan Note (Signed)
Controlled with famotidine 150 mg BID.

## 2016-07-22 NOTE — Progress Notes (Signed)
Medicine attending: Medical history, presenting problems, physical findings, and medications, reviewed with resident physician Dr Alexa Burns on the day of the patient visit and I concur with her evaluation and management plan. 

## 2016-07-22 NOTE — Assessment & Plan Note (Signed)
Lab Results  Component Value Date   HGBA1C 7.3 07/22/2016   HGBA1C 7.8 03/02/2016   HGBA1C 7.6 09/11/2015     Assessment: Diabetes control:   Controlled Progress toward A1C goal:   At goal Comments: compliant with Levemir 33 units QHS. Patient and daughter deny hypoglycemic events.   Plan: Medications:  continue current medications Instruction/counseling given: reminded to bring blood glucose meter & log to each visit and reminded to bring medications to each visit Other plans: Follow up in 3 months

## 2016-07-22 NOTE — Progress Notes (Signed)
    CC: Follow up for T2DM  HPI: [name] is a 70 y.o. female with PMHx of HTN, GERD, CAD, T2DM and Schizophrenia who presents to the clinic for follow up for T2DM and HTN. Please see problem based plan for more information.  Patient is eating and drinking well. She denies worsening chest pain or shortness of breath.   Past Medical History:  Diagnosis Date  . Atypical nevus   . Breast cancer ([place]) 07/14/06   Left breast. Ductal carcinoma, grade 3. Followed by Dr. Truddie Coco. s/p lumpectomy with radiation  . Dental caries   . Diabetes mellitus   . DVT (deep vein thrombosis) in pregnancy ([place]) 1982   Affected the RLE. Required coumadin.  . Fibroids   . GERD (gastroesophageal reflux disease)   . Goiter    U/S 08/2009-persistent multiple small nodules, one in the right lower pole slightly enlarged, recommend continued followup.  Marland Kitchen History of mitral valve prolapse   . Hyperlipidemia   . Hypertension   . Ovarian cyst   . Schizophrenia ([place])    Review of Systems: Please see pertinent ROS reviewed in HPI and problem based charting.   Physical Exam: Vitals:   07/22/16 1025 07/22/16 1049  BP: (!) 163/79 (!) 155/75  Pulse: 76 76  Temp: 98.3 F (36.8 C)   TempSrc: Oral   SpO2: 99%   Weight: 204 lb 9.6 oz (92.8 kg)    General: Vital signs reviewed.  Patient is well-developed and well-nourished, in no acute distress and cooperative with exam.  Neck: Supple, trachea midline, no carotid bruit present.  Cardiovascular: RRR, S1 normal, S2 normal, no murmurs, gallops, or rubs. Pulmonary/Chest: Clear to auscultation bilaterally, no wheezes, rales, or rhonchi. Abdominal: Soft, non-tender, non-distended, BS + Extremities: No lower extremity edema bilaterally Skin: Warm, dry and intact.   Assessment & Plan:  See encounters tab for problem based medical decision making. Patient discussed with Dr. Beryle Beams

## 2016-07-23 DIAGNOSIS — M79671 Pain in right foot: Secondary | ICD-10-CM | POA: Diagnosis not present

## 2016-07-23 DIAGNOSIS — S90121A Contusion of right lesser toe(s) without damage to nail, initial encounter: Secondary | ICD-10-CM | POA: Diagnosis not present

## 2016-07-27 ENCOUNTER — Telehealth: Payer: Self-pay | Admitting: General Practice

## 2016-07-27 NOTE — Telephone Encounter (Signed)
Left msg regarding November 2018 appts.

## 2016-08-24 ENCOUNTER — Encounter: Payer: Self-pay | Admitting: Internal Medicine

## 2016-08-24 DIAGNOSIS — Z79891 Long term (current) use of opiate analgesic: Secondary | ICD-10-CM | POA: Insufficient documentation

## 2016-09-10 ENCOUNTER — Other Ambulatory Visit: Payer: Self-pay

## 2016-09-10 NOTE — Telephone Encounter (Signed)
Script from November was verified at [facility]

## 2016-09-10 NOTE — Telephone Encounter (Signed)
traMADol (ULTRAM) 50 MG tablet, refill request @ [facility] on [address].

## 2016-09-11 ENCOUNTER — Other Ambulatory Visit: Payer: Self-pay | Admitting: Family Medicine

## 2016-10-08 ENCOUNTER — Other Ambulatory Visit: Payer: Self-pay | Admitting: Internal Medicine

## 2016-10-08 DIAGNOSIS — Z1231 Encounter for screening mammogram for malignant neoplasm of breast: Secondary | ICD-10-CM

## 2016-10-21 ENCOUNTER — Encounter: Payer: Self-pay | Admitting: Internal Medicine

## 2016-11-02 ENCOUNTER — Other Ambulatory Visit: Payer: Self-pay

## 2016-11-02 MED ORDER — TAMOXIFEN CITRATE 20 MG PO TABS: ORAL_TABLET | ORAL | 2 refills | Status: DC

## 2016-11-05 ENCOUNTER — Encounter: Payer: Self-pay | Admitting: Family Medicine

## 2016-11-06 ENCOUNTER — Encounter: Payer: Self-pay | Admitting: Internal Medicine

## 2016-11-11 ENCOUNTER — Encounter: Payer: Self-pay | Admitting: Family Medicine

## 2016-11-11 ENCOUNTER — Ambulatory Visit: Payer: Self-pay

## 2016-11-18 ENCOUNTER — Ambulatory Visit (INDEPENDENT_AMBULATORY_CARE_PROVIDER_SITE_OTHER): Payer: Medicare Other | Admitting: Internal Medicine

## 2016-11-18 ENCOUNTER — Encounter: Payer: Self-pay | Admitting: Internal Medicine

## 2016-11-18 VITALS — BP 133/72 | HR 72 | Temp 97.6°F | Ht 70.0 in | Wt 206.6 lb

## 2016-11-18 DIAGNOSIS — Z9114 Patient's other noncompliance with medication regimen: Secondary | ICD-10-CM | POA: Diagnosis not present

## 2016-11-18 DIAGNOSIS — E119 Type 2 diabetes mellitus without complications: Secondary | ICD-10-CM | POA: Diagnosis present

## 2016-11-18 DIAGNOSIS — I10 Essential (primary) hypertension: Secondary | ICD-10-CM | POA: Diagnosis not present

## 2016-11-18 DIAGNOSIS — Z853 Personal history of malignant neoplasm of breast: Secondary | ICD-10-CM | POA: Diagnosis not present

## 2016-11-18 DIAGNOSIS — Z794 Long term (current) use of insulin: Secondary | ICD-10-CM | POA: Diagnosis not present

## 2016-11-18 DIAGNOSIS — Z1211 Encounter for screening for malignant neoplasm of colon: Secondary | ICD-10-CM

## 2016-11-18 DIAGNOSIS — Z0000 Encounter for general adult medical examination without abnormal findings: Secondary | ICD-10-CM

## 2016-11-18 DIAGNOSIS — Z87891 Personal history of nicotine dependence: Secondary | ICD-10-CM

## 2016-11-18 DIAGNOSIS — Z79899 Other long term (current) drug therapy: Secondary | ICD-10-CM | POA: Diagnosis not present

## 2016-11-18 DIAGNOSIS — E1165 Type 2 diabetes mellitus with hyperglycemia: Principal | ICD-10-CM

## 2016-11-18 DIAGNOSIS — Z7982 Long term (current) use of aspirin: Secondary | ICD-10-CM

## 2016-11-18 DIAGNOSIS — IMO0001 Reserved for inherently not codable concepts without codable children: Secondary | ICD-10-CM

## 2016-11-18 LAB — GLUCOSE, CAPILLARY: GLUCOSE-CAPILLARY: 188 mg/dL — AB (ref 65–99)

## 2016-11-18 LAB — POCT GLYCOSYLATED HEMOGLOBIN (HGB A1C): Hemoglobin A1C: 7.6

## 2016-11-18 NOTE — Progress Notes (Signed)
Medicine attending: Medical history, presenting problems, physical findings, and medications, reviewed with resident physician Dr Alexa Burns on the day of the patient visit and I concur with her evaluation and management plan. 

## 2016-11-18 NOTE — Assessment & Plan Note (Addendum)
Lab Results  Component Value Date   HGBA1C 7.6 11/18/2016   HGBA1C 7.3 07/22/2016   HGBA1C 7.8 03/02/2016     Assessment: Diabetes control:  Controlled Progress toward A1C goal:    Just above goal Comments: Compliant with metformin 1000 mg BID, but admittedly forgets to take Levemir at least twice per week  Plan: Medications:  Continue current medications, emphasized importance of compliance Other plans: Follow up in 6 months

## 2016-11-18 NOTE — Progress Notes (Signed)
    CC: Follow-up for diabetes  HPI: [name] is a 71 y.o. female with PMHx of type 2 diabetes, hypertension, history of breast cancer who presents to the clinic for follow-up for diabetes.  Patient is eating and drinking well. She denies worsening or new chest pain or shortness of breath. Please see problem based charting for assessment and plan.   Past Medical History:  Diagnosis Date  . Atypical nevus   . Breast cancer ([place]) 07/14/06   Left breast. Ductal carcinoma, grade 3. Followed by Dr. Truddie Coco. s/p lumpectomy with radiation  . Dental caries   . Diabetes mellitus   . DVT (deep vein thrombosis) in pregnancy ([place]) 1982   Affected the RLE. Required coumadin.  . Fibroids   . GERD (gastroesophageal reflux disease)   . Goiter    U/S 08/2009-persistent multiple small nodules, one in the right lower pole slightly enlarged, recommend continued followup.  Marland Kitchen History of mitral valve prolapse   . Hyperlipidemia   . Hypertension   . Ovarian cyst   . Schizophrenia ([place])     Review of Systems: Please see pertinent ROS reviewed in HPI and problem based charting.   Physical Exam: Vitals:   11/18/16 1358 11/18/16 1421  BP: (!) 146/64 133/72  Pulse: 72   Temp: 97.6 F (36.4 C)   TempSrc: Oral   SpO2: 99%   Weight: 206 lb 9.6 oz (93.7 kg)   Height: 5\' 10"  (1.778 m)    General: Vital signs reviewed.  Patient is well-developed and well-nourished, in no acute distress and cooperative with exam.  Neck: Supple, trachea midline, no carotid bruit present.  Cardiovascular: RRR, S1 normal, S2 normal, no murmurs, gallops, or rubs. Pulmonary/Chest: Clear to auscultation bilaterally, no wheezes, rales, or rhonchi. Abdominal: Soft, non-tender, non-distended, BS + Extremities: Trace lower extremity edema bilaterally Skin: Warm, dry and intact. No rashes or erythema.  Assessment & Plan:  See encounters tab for problem based medical decision making. Patient discussed with Dr.  Beryle Beams

## 2016-11-18 NOTE — Assessment & Plan Note (Addendum)
Breast cancer screening: Patient is due for mammogram, mammogram ordered, daughter will schedule Colon cancer screening: Patient is due, provided with stool cards Patient refused: Hepatitis C antibody, Influenza vaccine, pneumococcal vaccine, and tetanus vaccine

## 2016-11-18 NOTE — Patient Instructions (Signed)
Please remember to take your Levemir every night. Please take all other medications as prescribed.  Please remember to schedule your mammogram and to complete the stool cards.

## 2016-11-18 NOTE — Assessment & Plan Note (Addendum)
BP Readings from Last 3 Encounters:  11/18/16 133/72  07/22/16 (!) 155/75  03/02/16 137/73    Lab Results  Component Value Date   NA 141 10/12/2014   K 4.1 10/12/2014   CREATININE 0.74 06/03/2015    Assessment: Blood pressure control:   controlled Progress toward BP goal:    at goal Comments: compliant with metoprolol 100 mg BID and lisinopril 40 mg QD  Plan: Medications:  Continue current medications Other plans: Follow up in 6 months, check BMET

## 2016-11-19 LAB — BMP8+ANION GAP
Anion Gap: 16 mmol/L (ref 10.0–18.0)
BUN / CREAT RATIO: 16 (ref 12–28)
BUN: 11 mg/dL (ref 8–27)
CHLORIDE: 100 mmol/L (ref 96–106)
CO2: 23 mmol/L (ref 18–29)
Calcium: 9 mg/dL (ref 8.7–10.3)
Creatinine, Ser: 0.7 mg/dL (ref 0.57–1.00)
GFR calc Af Amer: 102 mL/min/{1.73_m2} (ref >59)
GFR calc non Af Amer: 89 mL/min/{1.73_m2} (ref >59)
Glucose: 210 mg/dL — ABNORMAL HIGH (ref 65–99)
Potassium: 4 mmol/L (ref 3.5–5.2)
Sodium: 139 mmol/L (ref 134–144)

## 2016-11-23 ENCOUNTER — Telehealth: Payer: Self-pay | Admitting: Family Medicine

## 2016-11-23 DIAGNOSIS — M8580 Other specified disorders of bone density and structure, unspecified site: Secondary | ICD-10-CM

## 2016-11-23 DIAGNOSIS — Z13220 Encounter for screening for lipoid disorders: Secondary | ICD-10-CM

## 2016-11-23 NOTE — Telephone Encounter (Signed)
LAbs

## 2016-11-26 ENCOUNTER — Ambulatory Visit (INDEPENDENT_AMBULATORY_CARE_PROVIDER_SITE_OTHER): Payer: Medicare Other

## 2016-11-26 VITALS — BP 102/60 | HR 84 | Temp 98.5°F | Ht 64.0 in | Wt 155.8 lb

## 2016-11-26 DIAGNOSIS — Z13220 Encounter for screening for lipoid disorders: Secondary | ICD-10-CM

## 2016-11-26 DIAGNOSIS — M8580 Other specified disorders of bone density and structure, unspecified site: Secondary | ICD-10-CM | POA: Diagnosis not present

## 2016-11-26 DIAGNOSIS — Z0000 Encounter for general adult medical examination without abnormal findings: Secondary | ICD-10-CM | POA: Diagnosis not present

## 2016-11-26 LAB — COMPREHENSIVE METABOLIC PANEL
ALK PHOS: 40 U/L (ref 39–117)
ALT: 14 U/L (ref 0–35)
AST: 16 U/L (ref 0–37)
Albumin: 4 g/dL (ref 3.5–5.2)
BILIRUBIN TOTAL: 0.4 mg/dL (ref 0.2–1.2)
BUN: 15 mg/dL (ref 6–23)
CALCIUM: 9.3 mg/dL (ref 8.4–10.5)
CO2: 29 mEq/L (ref 19–32)
CREATININE: 1.03 mg/dL (ref 0.40–1.20)
Chloride: 106 mEq/L (ref 96–112)
GFR: 56.43 mL/min — AB (ref >60.00)
Glucose, Bld: 96 mg/dL (ref 70–99)
Potassium: 4.5 mEq/L (ref 3.5–5.1)
Sodium: 141 mEq/L (ref 135–145)
TOTAL PROTEIN: 6.6 g/dL (ref 6.0–8.3)

## 2016-11-26 LAB — VITAMIN D 25 HYDROXY (VIT D DEFICIENCY, FRACTURES): VITD: 31.64 ng/mL (ref 30.00–100.00)

## 2016-11-26 LAB — LIPID PANEL
CHOLESTEROL: 163 mg/dL (ref 0–200)
HDL: 75.9 mg/dL (ref >39.00)
LDL CALC: 73 mg/dL (ref 0–99)
NonHDL: 87.43
Total CHOL/HDL Ratio: 2
Triglycerides: 73 mg/dL (ref 0.0–149.0)
VLDL: 14.6 mg/dL (ref 0.0–40.0)

## 2016-11-26 NOTE — Patient Instructions (Addendum)
---Please contact your insurance regarding tetanus vaccine.   Cynthia Griffin , Thank you for taking time to come for your Medicare Wellness Visit. I appreciate your ongoing commitment to your health goals. Please review the following plan we discussed and let me know if I can assist you in the future.   These are the goals we discussed: Goals    . Increase physical activity          Starting 11/26/2016, I will continue to walk at least 30 min 3 days per week and to do boot camp for 60 min twice weekly.        This is a list of the screening recommended for you and due dates:  Health Maintenance  Topic Date Due  . Cologuard (Stool DNA test)  11/26/2017*  . Stool Blood Test  11/26/2025*  . Tetanus Vaccine  11/27/2026*  . Mammogram  11/06/2018  . Flu Shot  Addressed  . DEXA scan (bone density measurement)  Completed  .  Hepatitis C: One time screening is recommended by Center for Disease Control  (CDC) for  adults born from [birth year] through 1964.   Completed  . Pneumonia vaccines  Completed  *Topic was postponed. The date shown is not the original due date.   Preventive Care for Adults  A healthy lifestyle and preventive care can promote health and wellness. Preventive health guidelines for adults include the following key practices.  . A routine yearly physical is a good way to check with your health care provider about your health and preventive screening. It is a chance to share any concerns and updates on your health and to receive a thorough exam.  . Visit your dentist for a routine exam and preventive care every 6 months. Brush your teeth twice a day and floss once a day. Good oral hygiene prevents tooth decay and gum disease.  . The frequency of eye exams is based on your age, health, family medical history, use  of contact lenses, and other factors. Follow your health care provider's ecommendations for frequency of eye exams.  . Eat a healthy diet. Foods like vegetables, fruits, whole  grains, low-fat dairy products, and lean protein foods contain the nutrients you need without too many calories. Decrease your intake of foods high in solid fats, added sugars, and salt. Eat the right amount of calories for you. Get information about a proper diet from your health care provider, if necessary.  . Regular physical exercise is one of the most important things you can do for your health. Most adults should get at least 150 minutes of moderate-intensity exercise (any activity that increases your heart rate and causes you to sweat) each week. In addition, most adults need muscle-strengthening exercises on 2 or more days a week.  Silver Sneakers may be a benefit available to you. To determine eligibility, you may visit the website: www.silversneakers.com or contact program at 559-149-2555 Mon-Fri between 8AM-8PM.   . Maintain a healthy weight. The body mass index (BMI) is a screening tool to identify possible weight problems. It provides an estimate of body fat based on height and weight. Your health care provider can find your BMI and can help you achieve or maintain a healthy weight.   For adults 20 years and older: ? A BMI below 18.5 is considered underweight. ? A BMI of 18.5 to 24.9 is normal. ? A BMI of 25 to 29.9 is considered overweight. ? A BMI of 30 and above is considered obese.   Marland Kitchen  Maintain normal blood lipids and cholesterol levels by exercising and minimizing your intake of saturated fat. Eat a balanced diet with plenty of fruit and vegetables. Blood tests for lipids and cholesterol should begin at age 79 and be repeated every 5 years. If your lipid or cholesterol levels are high, you are over 50, or you are at high risk for heart disease, you may need your cholesterol levels checked more frequently. Ongoing high lipid and cholesterol levels should be treated with medicines if diet and exercise are not working.  . If you smoke, find out from your health care provider how to  quit. If you do not use tobacco, please do not start.  . If you choose to drink alcohol, please do not consume more than 2 drinks per day. One drink is considered to be 12 ounces (355 mL) of beer, 5 ounces (148 mL) of wine, or 1.5 ounces (44 mL) of liquor.  . If you are 88-44 years old, ask your health care provider if you should take aspirin to prevent strokes.  . Use sunscreen. Apply sunscreen liberally and repeatedly throughout the day. You should seek shade when your shadow is shorter than you. Protect yourself by wearing long sleeves, pants, a wide-brimmed hat, and sunglasses year round, whenever you are outdoors.  . Once a month, do a whole body skin exam, using a mirror to look at the skin on your back. Tell your health care provider of new moles, moles that have irregular borders, moles that are larger than a pencil eraser, or moles that have changed in shape or color.

## 2016-11-26 NOTE — Progress Notes (Signed)
PCP notes:   Health maintenance:  Colon cancer screening - Cologuard ordered Tetanus - postponed/insurance Flu vaccine - per pt, administered in Aug 2017  Abnormal screenings:   Hearing - failed  Patient concerns:   None  Nurse concerns:  None  Next PCP appt:   12/03/16 @ 1515

## 2016-11-26 NOTE — Progress Notes (Signed)
Pre visit review using our clinic review tool, if applicable. No additional management support is needed unless otherwise documented below in the visit note. 

## 2016-11-26 NOTE — Progress Notes (Signed)
Subjective:   Cynthia Griffin is a 71 y.o. female who presents for Medicare Annual (Subsequent) preventive examination.  Review of Systems:  N/A Cardiac Risk Factors include: advanced age (>7men, >80 women);dyslipidemia     Objective:     Vitals: BP 102/60 (BP Location: Right Arm, Patient Position: Sitting, Cuff Size: Normal)   Pulse 84   Temp 98.5 F (36.9 C) (Oral)   Ht 5\' 4"  (1.626 m) Comment: no shoes  Wt 155 lb 12 oz (70.6 kg)   SpO2 95%   BMI 26.73 kg/m   Body mass index is 26.73 kg/m.   Tobacco History  Smoking Status  . Former Smoker  . Packs/day: 1.25  . Years: 29.00  . Types: Cigarettes  . Quit date: 09/07/1992  Smokeless Tobacco  . Never Used     Counseling given: No   Past Medical History:  Diagnosis Date  . Anemia    hx  . Anxiety   . Arthritis   . Breast cancer ([place]) 11/14/12   left, ER +, PR -, HER 2 -  . Carpal tunnel syndrome of right wrist   . Family history of anesthesia complication    pneumothorax - post op 1980's  . Hx of radiation therapy 05/09/13-06/22/13   left breast 60.4Gy  . 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 Pneumonia   . Pneumothorax on right March, 2014   while having port-a-cath placed   Past Surgical History:  Procedure Laterality Date  . ABDOMINAL HYSTERECTOMY  1992   fibroids, total  . APPENDECTOMY    . AXILLARY LYMPH NODE DISSECTION Left 12/01/2012   Procedure: AXILLARY LYMPH NODE DISSECTION;  Surgeon: Stark Klein, MD;  Location: [facility];  Service: General;  Laterality: Left;  . EYE SURGERY Bilateral 2006   lasik surgery  . LATISSIMUS FLAP TO BREAST Left 03/29/2014   dr Harlow Mares  . LATISSIMUS FLAP TO BREAST Left 03/29/2014   Procedure: LEFT LATISSIMUS FLAP;  Surgeon: Crissie Reese, MD;  Location: [facility];  Service: Plastics;  Laterality: Left;  Marland Kitchen MASTECTOMY Left December 20, 2012  . MASTECTOMY W/ SENTINEL NODE BIOPSY Left 11/14/2012   Procedure: MASTECTOMY WITH SENTINEL  LYMPH NODE BIOPSY;  Surgeon: Stark Klein, MD;  Location: [facility];  Service: General;  Laterality: Left;  . MENISCUS REPAIR     Right Knee  . PORT-A-CATH REMOVAL N/A 05/02/2013   Procedure: REMOVAL PORT-A-CATH;  Surgeon: Stark Klein, MD;  Location: [facility];  Service: General;  Laterality: N/A;  . PORTACATH PLACEMENT Right 12/01/2012   Procedure: INSERTION PORT-A-CATH;  Surgeon: Stark Klein, MD;  Location: [facility];  Service: General;  Laterality: Right;  . SHOULDER ARTHROSCOPY Right 1/15   rotator cuff repair  . TONSILLECTOMY     Family History  Problem Relation Age of Onset  . Bladder Cancer Father 46  . Lung cancer Father 71  . Diabetes Father   . Bone cancer Paternal Uncle     ? late 32s  . Diabetes Mother   . Peripheral vascular disease Mother   . Multiple sclerosis Sister   . Parkinson's disease Paternal Grandmother   . Heart disease Paternal Grandfather    History  Sexual Activity  . Sexual activity: No    Comment: menarche 1, irst live birth age 84, P52, HRT x 18 yrs    Outpatient Encounter Prescriptions as of 11/26/2016  Medication Sig  . Biotin 1000 MCG tablet Take  1,000 mcg by mouth daily.  . calcium citrate-vitamin D (CITRACAL+D) 315-200 MG-UNIT per tablet Take 1 tablet by mouth 2 (two) times daily.   . citalopram (CELEXA) 40 MG tablet TAKE 1 TABLET BY MOUTH ONCE A DAY  . Multiple Vitamin (MULTIVITAMIN WITH MINERALS) TABS Take 1 tablet by mouth daily.  . tamoxifen (NOLVADEX) 20 MG tablet TAKE 1 TABLET (20 MG TOTAL) BY MOUTH DAILY.   No facility-administered encounter medications on file as of 11/26/2016.     Activities of Daily Living In your present state of health, do you have any difficulty performing the following activities: 11/26/2016  Hearing? Y  Vision? N  Difficulty concentrating or making decisions? N  Walking or climbing stairs? N  Dressing or bathing? N  Doing errands, shopping? N  Preparing Food and eating ? N  Using the Toilet? N    In the past six months, have you accidently leaked urine? N  Do you have problems with loss of bowel control? N  Managing your Medications? N  Managing your Finances? N  Housekeeping or managing your Housekeeping? N  Some recent data might be hidden    Patient Care Team: Jinny Sanders, MD as PCP - General (Family Medicine)    Assessment:     Hearing Screening   125Hz  250Hz  500Hz  1000Hz  2000Hz  3000Hz  4000Hz  6000Hz  8000Hz   Right ear:   40 40 40  0    Left ear:   0 40 40  0    Vision Screening Comments: Last vision exam with Dr. Gershon Crane in Dec 2017   Exercise Activities and Dietary recommendations Current Exercise Habits: Structured exercise class, Type of exercise: walking;Other - see comments (boot camp 1 hr twice weekly), Time (Minutes): 30, Frequency (Times/Week): 3, Weekly Exercise (Minutes/Week): 90, Intensity: Moderate, Exercise limited by: None identified  Goals    . Increase physical activity          Starting 11/26/2016, I will continue to walk at least 30 min 3 days per week and to do boot camp for 60 min twice weekly.       Fall Risk Fall Risk  11/26/2016 10/24/2014  Falls in the past year? No No   Depression Screen PHQ 2/9 Scores 11/26/2016 10/24/2014  PHQ - 2 Score 0 0     Cognitive Function MMSE - Mini Mental State Exam 11/26/2016  Orientation to time 5  Orientation to Place 5  Registration 3  Attention/ Calculation 0  Recall 3  Language- name 2 objects 0  Language- repeat 1  Language- follow 3 step command 3  Language- read & follow direction 0  Write a sentence 0  Copy design 0  Total score 20     PLEASE NOTE: A Mini-Cog screen was completed. Maximum score is 20. A value of 0 denotes this part of Folstein MMSE was not completed or the patient failed this part of the Mini-Cog screening.   Mini-Cog Screening Orientation to Time - Max 5 pts Orientation to Place - Max 5 pts Registration - Max 3 pts Recall - Max 3 pts Language Repeat - Max 1  pts Language Follow 3 Step Command - Max 3 pts     Immunization History  Administered Date(s) Administered  . Influenza, Seasonal, Injecte, Preservative Fre 05/12/2014  . Influenza,inj,Quad PF,36+ Mos 06/12/2015  . Influenza-Unspecified 04/07/2016  . Pneumococcal Conjugate-13 10/24/2014  . Pneumococcal Polysaccharide-23 11/22/2015  . Zoster 09/07/2012   Screening Tests Health Maintenance  Topic Date Due  . Fecal DNA (  Cologuard)  11/26/2017 (Originally 08/24/1997)  . COLON CANCER SCREENING ANNUAL FOBT  11/26/2025 (Originally 10/30/2015)  . TETANUS/TDAP  11/27/2026 (Originally 08/24/1966)  . MAMMOGRAM  11/06/2018  . INFLUENZA VACCINE  Addressed  . DEXA SCAN  Completed  . Hepatitis C Screening  Completed  . PNA vac Low Risk Adult  Completed      Plan:     I have personally reviewed and addressed the Medicare Annual Wellness questionnaire and have noted the following in the patient's chart:  A. Medical and social history B. Use of alcohol, tobacco or illicit drugs  C. Current medications and supplements D. Functional ability and status E.  Nutritional status F.  Physical activity G. Advance directives H. List of other physicians I.  Hospitalizations, surgeries, and ER visits in previous 12 months J.  [place] to include hearing, vision, cognitive, depression L. Referrals and appointments - none  In addition, I have reviewed and discussed with patient certain preventive protocols, quality metrics, and best practice recommendations. A written personalized care plan for preventive services as well as general preventive health recommendations were provided to patient.  See attached scanned questionnaire for additional information.   Signed,   Lindell Noe, MHA, BS, LPN Health Coach

## 2016-11-29 NOTE — Progress Notes (Signed)
I reviewed health advisor's note, was available for consultation, and agree with documentation and plan.  

## 2016-12-01 ENCOUNTER — Encounter: Payer: Medicare Other | Admitting: Family Medicine

## 2016-12-01 ENCOUNTER — Other Ambulatory Visit: Payer: Self-pay | Admitting: Internal Medicine

## 2016-12-01 DIAGNOSIS — Z794 Long term (current) use of insulin: Principal | ICD-10-CM

## 2016-12-01 DIAGNOSIS — IMO0001 Reserved for inherently not codable concepts without codable children: Secondary | ICD-10-CM

## 2016-12-01 DIAGNOSIS — E1165 Type 2 diabetes mellitus with hyperglycemia: Principal | ICD-10-CM

## 2016-12-03 ENCOUNTER — Ambulatory Visit (INDEPENDENT_AMBULATORY_CARE_PROVIDER_SITE_OTHER): Payer: Medicare Other | Admitting: Family Medicine

## 2016-12-03 ENCOUNTER — Encounter: Payer: Self-pay | Admitting: Family Medicine

## 2016-12-03 VITALS — BP 130/64 | HR 80 | Temp 98.9°F | Ht 64.0 in | Wt 158.0 lb

## 2016-12-03 DIAGNOSIS — Z0000 Encounter for general adult medical examination without abnormal findings: Secondary | ICD-10-CM | POA: Diagnosis not present

## 2016-12-03 DIAGNOSIS — F324 Major depressive disorder, single episode, in partial remission: Secondary | ICD-10-CM

## 2016-12-03 DIAGNOSIS — M2550 Pain in unspecified joint: Secondary | ICD-10-CM | POA: Diagnosis not present

## 2016-12-03 DIAGNOSIS — C50912 Malignant neoplasm of unspecified site of left female breast: Secondary | ICD-10-CM

## 2016-12-03 MED ORDER — CITALOPRAM HYDROBROMIDE 40 MG PO TABS: 40.0000 mg | ORAL_TABLET | Freq: Every day | ORAL | 3 refills | Status: AC

## 2016-12-03 NOTE — Progress Notes (Signed)
Subjective:    Patient ID: Cynthia Griffin, female    DOB: 1946-04-30, 71 y.o.   MRN: [id number]  HPI 71 year old female presents for complete physical and follow up on chronic heatth issues. The patient saw Candis Musa, LPN for medicare wellness. Note reviewed in detail and important notes copied below. Health maintenance: Colon cancer screening - Cologuard ordered Tetanus - postponed/insurance Flu vaccine - per pt, administered in Aug 2017  Abnormal screenings:  Hearing - failed Has bilateral tinnitus. Not bothersome.  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  Social History /Family History/Past Medical History reviewed and updated if needed.  Hx of breast cancer with left mastectomy, S/P chemo and radiation.  Failed reconstruction in July 2015/  Followed by [facility]. On tamoxifen.     She is exercising boot camp 2 times a week, also walking.  Diet: moderate.  Reviewed labs in detail.  Blood pressure 130/64, pulse 80, temperature 98.9 F (37.2 C), temperature source Oral, height 5\' 4"  (1.626 m), weight 158 lb (71.7 kg), SpO2 97 %.  Review of Systems  Constitutional: Negative for fatigue and fever.  HENT: Negative for congestion.   Eyes: Negative for pain.  Respiratory: Negative for cough and shortness of breath.   Cardiovascular: Negative for chest pain, palpitations and leg swelling.  Gastrointestinal: Negative for abdominal pain.  Genitourinary: Negative for dysuria and vaginal bleeding.  Musculoskeletal: Negative for back pain.  Neurological: Negative for syncope, light-headedness and headaches.  Psychiatric/Behavioral: Negative for dysphoric mood.       Objective:   Physical Exam  Constitutional: Vital signs are normal. She appears well-developed and well-nourished. She is cooperative.  Non-toxic appearance.  She does not appear ill. No distress.  HENT:  Head: Normocephalic.  Right Ear: Hearing, tympanic membrane, external ear and ear canal normal.  Left Ear: Hearing, tympanic membrane, external ear and ear canal normal.  Nose: Nose normal.  Eyes: Conjunctivae, EOM and lids are normal. Pupils are equal, round, and reactive to light. Lids are everted and swept, no foreign bodies found.  Neck: Trachea normal and normal range of motion. Neck supple. Carotid bruit is not present. No thyroid mass and no thyromegaly present.  Cardiovascular: Normal rate, regular rhythm, S1 normal, S2 normal, normal heart sounds and intact distal pulses.  Exam reveals no gallop.   No murmur heard. Pulmonary/Chest: Effort normal and breath sounds normal. No respiratory distress. She has no wheezes. She has no rhonchi. She has no rales.  Abdominal: Soft. Normal appearance and bowel sounds are normal. She exhibits no distension, no fluid wave, no abdominal bruit and no mass. There is no hepatosplenomegaly. There is no tenderness. There is no rebound, no guarding and no CVA tenderness. No hernia.  Genitourinary: No breast swelling, tenderness, discharge or bleeding.  Genitourinary Comments: Left mastectomy scar well healed.  Lymphadenopathy:    She has no cervical adenopathy.    She has no axillary adenopathy.  Neurological: She is alert. She has normal strength. No cranial nerve deficit or sensory deficit.  Skin: Skin is warm, dry and intact. No rash noted.  Psychiatric: Her speech is normal and behavior is normal. Judgment normal. Her mood appears not anxious. Cognition and memory are normal. She does not exhibit a depressed mood.          Assessment & Plan:  The patient's preventative maintenance and recommended screening  tests for an annual wellness exam were reviewed in full today. Brought up to date unless services declined.  Counselled on the importance of diet, exercise, and its role in overall health and  mortality. The patient's FH and SH was reviewed, including their home life, tobacco status, and drug and alcohol status.   Vaccines: uptodate except td  DEXA: osteopenia  worsening 215 to 2018 on ca and vit. repeat in 2 years.  Mammo: per onc Colon: 2003 repeat due would prefer cologuard. Given at [facility].  PAP/ DVE: total hysterectomy.  Hep C done

## 2016-12-03 NOTE — Assessment & Plan Note (Signed)
On tamoxifen, Followed by [facility].

## 2016-12-03 NOTE — Assessment & Plan Note (Signed)
Stable control on celexa. 

## 2016-12-03 NOTE — Progress Notes (Signed)
Pre visit review using our clinic review tool, if applicable. No additional management support is needed unless otherwise documented below in the visit note. 

## 2016-12-03 NOTE — Assessment & Plan Note (Signed)
Likely OA. Not bothersome enough to use NSAID.

## 2016-12-03 NOTE — Patient Instructions (Signed)
Keep working on healthy eating and regular exercise.  Call if having issues with mood.

## 2016-12-09 ENCOUNTER — Ambulatory Visit
Admission: RE | Admit: 2016-12-09 | Discharge: 2016-12-09 | Disposition: A | Discharge status: Home / Self Care | Payer: Medicare Other | Source: Ambulatory Visit | Attending: Internal Medicine | Admitting: Internal Medicine

## 2016-12-09 DIAGNOSIS — Z1231 Encounter for screening mammogram for malignant neoplasm of breast: Secondary | ICD-10-CM | POA: Diagnosis not present

## 2016-12-13 ENCOUNTER — Other Ambulatory Visit: Payer: Self-pay | Admitting: Family Medicine

## 2016-12-25 ENCOUNTER — Other Ambulatory Visit: Payer: Medicare Other

## 2016-12-30 LAB — COLOGUARD: COLOGUARD: NEGATIVE

## 2017-01-07 ENCOUNTER — Encounter: Payer: Self-pay | Admitting: Family Medicine

## 2017-01-07 ENCOUNTER — Encounter: Payer: Self-pay | Admitting: *Deleted

## 2017-01-26 ENCOUNTER — Other Ambulatory Visit: Payer: Self-pay | Admitting: Internal Medicine

## 2017-02-10 ENCOUNTER — Encounter: Payer: Self-pay | Admitting: *Deleted

## 2017-03-03 ENCOUNTER — Ambulatory Visit (HOSPITAL_COMMUNITY)
Admission: RE | Admit: 2017-03-03 | Discharge: 2017-03-03 | Disposition: A | Discharge status: Home / Self Care | Payer: Medicare Other | Source: Ambulatory Visit | Attending: Student in an Organized Health Care Education/Training Program | Admitting: Student in an Organized Health Care Education/Training Program

## 2017-03-03 ENCOUNTER — Ambulatory Visit (INDEPENDENT_AMBULATORY_CARE_PROVIDER_SITE_OTHER): Payer: Medicare Other | Admitting: Internal Medicine

## 2017-03-03 VITALS — BP 159/76 | HR 68 | Temp 97.9°F | Wt 203.4 lb

## 2017-03-03 DIAGNOSIS — Z794 Long term (current) use of insulin: Secondary | ICD-10-CM | POA: Diagnosis not present

## 2017-03-03 DIAGNOSIS — E119 Type 2 diabetes mellitus without complications: Secondary | ICD-10-CM | POA: Diagnosis not present

## 2017-03-03 DIAGNOSIS — H8190 Unspecified disorder of vestibular function, unspecified ear: Secondary | ICD-10-CM | POA: Diagnosis not present

## 2017-03-03 DIAGNOSIS — R42 Dizziness and giddiness: Secondary | ICD-10-CM | POA: Insufficient documentation

## 2017-03-03 DIAGNOSIS — I639 Cerebral infarction, unspecified: Secondary | ICD-10-CM | POA: Diagnosis not present

## 2017-03-03 DIAGNOSIS — I2510 Atherosclerotic heart disease of native coronary artery without angina pectoris: Secondary | ICD-10-CM

## 2017-03-03 DIAGNOSIS — Z7982 Long term (current) use of aspirin: Secondary | ICD-10-CM

## 2017-03-03 DIAGNOSIS — R260 Ataxic gait: Secondary | ICD-10-CM | POA: Diagnosis not present

## 2017-03-03 DIAGNOSIS — R930 Abnormal findings on diagnostic imaging of skull and head, not elsewhere classified: Secondary | ICD-10-CM | POA: Diagnosis not present

## 2017-03-03 DIAGNOSIS — E785 Hyperlipidemia, unspecified: Secondary | ICD-10-CM

## 2017-03-03 DIAGNOSIS — I10 Essential (primary) hypertension: Secondary | ICD-10-CM

## 2017-03-03 DIAGNOSIS — IMO0001 Reserved for inherently not codable concepts without codable children: Secondary | ICD-10-CM

## 2017-03-03 DIAGNOSIS — Z87891 Personal history of nicotine dependence: Secondary | ICD-10-CM | POA: Diagnosis not present

## 2017-03-03 DIAGNOSIS — E1165 Type 2 diabetes mellitus with hyperglycemia: Secondary | ICD-10-CM

## 2017-03-03 DIAGNOSIS — Z79899 Other long term (current) drug therapy: Secondary | ICD-10-CM | POA: Diagnosis not present

## 2017-03-03 LAB — BASIC METABOLIC PANEL
Anion gap: 8 (ref 5–15)
BUN: 12 mg/dL (ref 6–20)
CHLORIDE: 105 mmol/L (ref 101–111)
CO2: 23 mmol/L (ref 22–32)
Calcium: 9 mg/dL (ref 8.9–10.3)
Creatinine, Ser: 0.72 mg/dL (ref 0.44–1.00)
GFR calc Af Amer: >60 mL/min (ref >60)
GFR calc non Af Amer: >60 mL/min (ref >60)
GLUCOSE: 156 mg/dL — AB (ref 65–99)
Potassium: 3.4 mmol/L — ABNORMAL LOW (ref 3.5–5.1)
Sodium: 136 mmol/L (ref 135–145)

## 2017-03-03 MED ORDER — MECLIZINE HCL 12.5 MG PO TABS: 12.5000 mg | ORAL_TABLET | Freq: Two times a day (BID) | ORAL | 0 refills | Status: DC | PRN

## 2017-03-03 MED ORDER — GADOBENATE DIMEGLUMINE 529 MG/ML IV SOLN
20.0000 mL | Freq: Once | INTRAVENOUS | Status: AC
  Administered 2017-03-03: 19 mL via INTRAVENOUS

## 2017-03-03 NOTE — Patient Instructions (Signed)
Thank you for visiting clinic today. As we have discussed that we are worried about stroke, we will do an MRI most likely tomorrow. We are also doing a lab for today. I'm giving you a prescription of meclizine, you can take it 1 tablet twice daily if you feel dizzy. Meclizine can make you drowsy so be careful with walking around. Do not drive after taking that medicine. We will call you with the results. If your dizziness get worse or you started throwing up or falling, please seek immediate medical attention.

## 2017-03-03 NOTE — Assessment & Plan Note (Signed)
Her previous A1c done in March was 7.6. She did not bring her glucometer meter today but according to daughter her blood sugar remains mostly above 150 and no hypoglycemic events.  -We will continue with the current management of metformin thousand milligrams twice daily and Levemir 35 units at bedtime. -We will recheck her A1c during next follow-up visit.

## 2017-03-03 NOTE — Progress Notes (Signed)
   CC: Dizziness for one week.  HPI:  [name] is a 71 y.o. with past medical history as listed below came to the clinic with complaint of continuous dizziness for one week. According to patient dizziness started all of a sudden when she woke up one day and trying to get out of bed, she described it as a room spinning around her, it's very is continuous and very in severity, denies any nausea or vomiting, she do endorses headache for the past one week and elevated blood pressure mostly in 387-564 systolic during this week. She also endorsed ringing in her ears lasting for a few hours during this week and resolved on its own. She denies any recent upper respiratory symptoms, sinus congestion, fever or chills. She denies any change in her vision, chest pain, worsening shortness of breath or palpitations. She do endorses generalized weakness for one week but denies any focal deficit. She denies any change in her appetite, bowel habits or any urinary symptoms. She denies any fall because of these episodes. She denies any episodes of hypoglycemia during this timeframe, her daughter did check her blood sugar and it remained above 150.  Past Medical History:  Diagnosis Date  . Atypical nevus   . Breast cancer ([place]) 07/14/06   Left breast. Ductal carcinoma, grade 3. Followed by Dr. Truddie Coco. s/p lumpectomy with radiation  . Dental caries   . Diabetes mellitus   . DVT (deep vein thrombosis) in pregnancy ([place]) 1982   Affected the RLE. Required coumadin.  . Fibroids   . GERD (gastroesophageal reflux disease)   . Goiter    U/S 08/2009-persistent multiple small nodules, one in the right lower pole slightly enlarged, recommend continued followup.  Marland Kitchen History of mitral valve prolapse   . Hyperlipidemia   . Hypertension   . Ovarian cyst   . Schizophrenia ([place])     Review of Systems:  As per HPI.  Physical Exam:  Vitals:   03/03/17 1320  BP: (!) 159/76  Pulse: 68  Temp: 97.9 F (36.6 C)    TempSrc: Oral  SpO2: 99%  Weight: 203 lb 6.4 oz (92.3 kg)   Orthostatic VS for the past 24 hrs:  BP- Lying Pulse- Lying BP- Sitting Pulse- Sitting BP- Standing at 0 minutes Pulse- Standing at 0 minutes  03/03/17 1330 148/76 68 (!) 144/91 69 157/75 72    General: Vital signs reviewed.  Patient is well-developed and well-nourished, in no acute distress and cooperative with exam.  Head: Normocephalic and atraumatic. Eyes: EOMI,A minor right nystagmus, conjunctivae normal, no scleral icterus.  Cardiovascular: RRR, S1 normal, S2 normal, no murmurs, gallops, or rubs. Pulmonary/Chest: Clear to auscultation bilaterally, no wheezes, rales, or rhonchi. Abdominal: Soft, non-tender, non-distended, BS +, no masses, organomegaly, or guarding present.  Extremities: No lower extremity edema bilaterally,  pulses symmetric and intact bilaterally. No cyanosis or clubbing. Neurological: A&O x3, Strength is normal and symmetric bilaterally, cranial nerve II-XII are grossly intact, no focal motor deficit, sensory intact to light touch bilaterally.  gait ataxia, unable to perform heel shin test. Romberg negative. Skin: Warm, dry and intact. No rashes or erythema. Psychiatric: Normal mood and affect. speech and behavior is normal. Cognition and memory are normal.  Assessment & Plan:   See Encounters Tab for problem based charting.  Patient seen with Dr. Evette Doffing.

## 2017-03-03 NOTE — Assessment & Plan Note (Addendum)
Her complaints of continuous dizziness was more consistent with either posterior circulation compromise or vestibular neuritis. She does not has any recent symptoms of upper respiratory infection or sinus congestion, her exam does not show any marked nystagmus, or worsening of nystagmus with rapid head movement. Neurologic exam was negative for any focal deficit but does show some gait ataxia and a minor right nystagmus.  She has risk factors being diabetic, hypertensive and having CAD along with dyslipidemia. She does take aspirin and Pravachol as  secondary prevention.  In order to rule out posterior circulation stroke, we ordered an MRI with contrast. -BMP. -Meclizine 12.5 mg twice daily when necessary. -Follow up next week for the results.  Addendum. MRI was negative for any infarct.

## 2017-03-03 NOTE — Assessment & Plan Note (Signed)
BP Readings from Last 3 Encounters:  03/03/17 (!) 159/76  11/18/16 133/72  07/22/16 (!) 155/75   Her blood pressure was elevated today. Orthostatic vitals were negative.  --Continue with the current management of lisinopril 40 mg daily, we will recheck her blood pressure at next follow-up visit in one week.

## 2017-03-04 NOTE — Progress Notes (Signed)
Internal Medicine Clinic Attending  I saw and evaluated the patient.  I personally confirmed the key portions of the history and exam documented by Dr. Reesa Chew and I reviewed pertinent patient test results.  The assessment, diagnosis, and plan were formulated together and I agree with the documentation in the resident's note.  Elderly woman with HTN, DM, and known CAD came to clinic with one week of sudden onset dizziness, which has been constant and is not resolving with time. On our exam she has mild gait ataxia. She has a small right gaze nystagmus, normal head impulse test, and normal test of skew. Clinical course was most consistent with acute vestibular syndrome, and given her risk for CVA and gait ataxia, we obtained a stat MRI which has ruled out large posterior circulation CVA. There still may have been a small event, especially given the microvascular changes seen on MRI. We will continue with secondary prevention with aspirin and pravastatin. Treat severe symptoms with a short course of meclizine. Follow up if she has worsening symptoms.

## 2017-03-05 ENCOUNTER — Telehealth: Payer: Self-pay

## 2017-03-05 NOTE — Telephone Encounter (Signed)
I called the patient and left a message. She has no infarct/stroke on MRI. If patient calls back, you can provide her with this information.

## 2017-03-05 NOTE — Telephone Encounter (Signed)
request sent to Dr Reesa Chew.

## 2017-03-05 NOTE — Telephone Encounter (Signed)
Requesting MRI result. Please call back.  

## 2017-03-17 ENCOUNTER — Encounter: Payer: Self-pay | Admitting: Internal Medicine

## 2017-03-17 ENCOUNTER — Ambulatory Visit (INDEPENDENT_AMBULATORY_CARE_PROVIDER_SITE_OTHER): Payer: Medicare Other | Admitting: Internal Medicine

## 2017-03-17 VITALS — BP 148/74 | HR 76 | Temp 98.2°F | Wt 205.6 lb

## 2017-03-17 DIAGNOSIS — F209 Schizophrenia, unspecified: Secondary | ICD-10-CM | POA: Diagnosis not present

## 2017-03-17 DIAGNOSIS — Z87891 Personal history of nicotine dependence: Secondary | ICD-10-CM | POA: Diagnosis not present

## 2017-03-17 DIAGNOSIS — Z833 Family history of diabetes mellitus: Secondary | ICD-10-CM

## 2017-03-17 DIAGNOSIS — K219 Gastro-esophageal reflux disease without esophagitis: Secondary | ICD-10-CM

## 2017-03-17 DIAGNOSIS — H8190 Unspecified disorder of vestibular function, unspecified ear: Secondary | ICD-10-CM

## 2017-03-17 DIAGNOSIS — Z880 Allergy status to penicillin: Secondary | ICD-10-CM | POA: Diagnosis not present

## 2017-03-17 DIAGNOSIS — IMO0001 Reserved for inherently not codable concepts without codable children: Secondary | ICD-10-CM

## 2017-03-17 DIAGNOSIS — E1121 Type 2 diabetes mellitus with diabetic nephropathy: Secondary | ICD-10-CM | POA: Diagnosis not present

## 2017-03-17 DIAGNOSIS — Z794 Long term (current) use of insulin: Secondary | ICD-10-CM | POA: Diagnosis not present

## 2017-03-17 DIAGNOSIS — E1165 Type 2 diabetes mellitus with hyperglycemia: Secondary | ICD-10-CM | POA: Diagnosis present

## 2017-03-17 DIAGNOSIS — E785 Hyperlipidemia, unspecified: Secondary | ICD-10-CM

## 2017-03-17 DIAGNOSIS — I10 Essential (primary) hypertension: Secondary | ICD-10-CM | POA: Diagnosis not present

## 2017-03-17 MED ORDER — POTASSIUM CHLORIDE ER 10 MEQ PO TBCR: 10.0000 meq | EXTENDED_RELEASE_TABLET | Freq: Every day | ORAL | 3 refills | Status: DC

## 2017-03-17 MED ORDER — MECLIZINE HCL 12.5 MG PO TABS: 12.5000 mg | ORAL_TABLET | Freq: Two times a day (BID) | ORAL | 0 refills | Status: DC | PRN

## 2017-03-17 MED ORDER — HYDROCHLOROTHIAZIDE 25 MG PO TABS: 25.0000 mg | ORAL_TABLET | Freq: Every day | ORAL | 3 refills | Status: DC

## 2017-03-17 NOTE — Assessment & Plan Note (Signed)
The patient's SBP elevated above goal of 140 on two checks in clinic today. The patient's SBP was also elevated at last visit 2 weeks ago. The patient and daughter report adherence to current regimen and no difficulty taking medication. Will start hydrochlorothiazide, 25 mg daily today. The patient's last BMP on 02/2017 showed good renal function but hypokalemia. Because of this the patient was given oral potassium replacement with her diuretic. The patient already has follow up with PCP scheduled and she was told to keep this appointment. Reassess BP at next visit. At this visit obtain BMP to assess K levels. Consider combination lisinopril-HCTZ pill if new regimen works for managing her BP.

## 2017-03-17 NOTE — Patient Instructions (Addendum)
Thanks for seeing [place] today.  I am happy that your dizziness is improving! I refilled your meclizine prescription. Take up to 2 tablets per day as needed for your dizziness.   Your blood pressure was high today. You were given a prescription for a diuretic (hydrochlorothiazide) and potassium supplement. Please start taking one tablet of your diuretic and one tablet of potassium daily.   Please follow up with your PCP in 1 month.

## 2017-03-17 NOTE — Assessment & Plan Note (Signed)
The patient's frequency and intensity of dizziness episodes have decreased over the past two weeks. She continues to report some gait instability 2/2 intermittent dizzy spells. She says these spells improve with meclizine. She was told she does not need to take this medication BID daily, as she has been doing for the past two weeks. Instead she was instructed to use this medication when she is having dizzy spells. Follow up symptoms at next PCP visit.

## 2017-03-17 NOTE — Assessment & Plan Note (Signed)
The patient reports being able to take her medications daily without difficulty. Her daughter assists with taking insulin at night. The patient does not think her dizzy episodes are related to low blood sugar, as she checks her sugars daily and doesn't think she had any low values. She was told to continue taking diabetes medication as previously instructed and to follow up with PCP at next visit regarding management of T2DM and goals of therapy.

## 2017-03-17 NOTE — Progress Notes (Signed)
   CC: HTN and dizziness follow up  HPI:  [name] is a 71 y.o. with a PMH of HTN, HLD, GERD, schizophrenia, and T2DM who was evaluated for acute onset of dizziness 2 weeks ago and is here for follow up. Per chart review, at last visit the patient reported acute onset of dizziness, room spinning, and gait instability. Stat MRI was obtained for evaluation of posterior stroke at that visit. The MRI was negative for acute stroke, the patient was diagnosed with vestibular neuritis, given meclizine for symptomatic relief, and told to follow up in 2 weekd.   Since her last visit the patient says her dizziness has improved. She has been taking her meclizine twice a day, every day since her last visit. She states that this medication has helped decrease the frequency and severity of her dizziness. She still does, however, describe room spinning sometimes when she wakes up in the morning and when she gets out of her chair during the day. She states that she is unsteady on her feet sometimes and she thinks the unsteadiness comes from her dizziness. Endorses seeing spots when she is dizzy. Denies any other changes in vision. Denies focal weakness in upper and lower extremities, loss of sensation, difficulty finding words or with speech.  The patient has been able to take all medications as prescribed, including her medications for HTN, T2DM, and schizophrenia. Her daughter, who accompanied her to today's visit, assists the patient's with her medications.   Past Medical History:  Diagnosis Date  . Atypical nevus   . Breast cancer ([place]) 07/14/06   Left breast. Ductal carcinoma, grade 3. Followed by Dr. Truddie Coco. s/p lumpectomy with radiation  . Dental caries   . Diabetes mellitus   . DVT (deep vein thrombosis) in pregnancy ([place]) 1982   Affected the RLE. Required coumadin.  . Fibroids   . GERD (gastroesophageal reflux disease)   . Goiter    U/S 08/2009-persistent multiple small nodules, one in the  right lower pole slightly enlarged, recommend continued followup.  Marland Kitchen History of mitral valve prolapse   . Hyperlipidemia   . Hypertension   . Ovarian cyst   . Schizophrenia ([place])    Review of Systems:   Patient denies chest pain, SOB, fevers, chills, unintentional weight loss, abdominal pain, and recent change in bowel/bladder habits.  Physical Exam:  Vitals:   03/17/17 0926 03/17/17 0951  BP: (!) 166/78 (!) 148/74  Pulse: 78 76  Temp: 98.2 F (36.8 C)   TempSrc: Oral   SpO2: 98%   Weight: 205 lb 9.6 oz (93.3 kg)    Physical Exam  Cardiovascular: Normal rate, regular rhythm, normal heart sounds and intact distal pulses.  Exam reveals no friction rub.   No murmur heard. Trace pitting edema to mid-shins bilaterally  Pulmonary/Chest: Effort normal and breath sounds normal. No respiratory distress.  Abdominal: Soft. She exhibits no distension. There is no tenderness.  Neurological:  PERRL. EOM intact. Subtle right nystagmus noted. Facial strength and sensation intact bilaterally. Tongue midline. Gross upper and lower extremity strength and sensation intact bilaterally. Rapid alternating movements intact bilaterally. Gait normal. Finger to nose within normal limits. No pronator drift.   Skin: Skin is warm and dry. Capillary refill takes less than 2 seconds. No erythema.    Assessment & Plan:   See Encounters Tab for problem based charting.  Patient seen with Dr. Beryle Beams.

## 2017-03-17 NOTE — Progress Notes (Signed)
Medicine attending: I personally interviewed and briefly examined this patient on the day of the patient visit and reviewed pertinent clinical ,laboratory, and radiographic data  with resident physician Dr. Thomasene Ripple and we discussed a management plan. She is responding to Rx for recent vestibular neuritis. We will add HCTZ for better control of HTN along with a potassium supplement.

## 2017-04-15 ENCOUNTER — Other Ambulatory Visit: Payer: Self-pay | Admitting: *Deleted

## 2017-04-15 MED ORDER — TRAMADOL HCL 50 MG PO TABS: 50.0000 mg | ORAL_TABLET | Freq: Three times a day (TID) | ORAL | 2 refills | Status: DC | PRN

## 2017-04-15 NOTE — Telephone Encounter (Signed)
Only fills intermittently.  [place] narcotic database appropriate.  Will refill.

## 2017-04-19 ENCOUNTER — Other Ambulatory Visit: Payer: Self-pay | Admitting: General Surgery

## 2017-04-19 DIAGNOSIS — C50512 Malignant neoplasm of lower-outer quadrant of left female breast: Secondary | ICD-10-CM

## 2017-04-19 NOTE — Telephone Encounter (Signed)
Called to pharm 

## 2017-04-21 ENCOUNTER — Other Ambulatory Visit: Payer: Self-pay | Admitting: Internal Medicine

## 2017-04-21 ENCOUNTER — Other Ambulatory Visit: Payer: Self-pay

## 2017-04-21 ENCOUNTER — Encounter: Payer: Self-pay | Admitting: Internal Medicine

## 2017-04-21 DIAGNOSIS — H8190 Unspecified disorder of vestibular function, unspecified ear: Secondary | ICD-10-CM

## 2017-04-21 MED ORDER — MECLIZINE HCL 12.5 MG PO TABS: 12.5000 mg | ORAL_TABLET | Freq: Two times a day (BID) | ORAL | 1 refills | Status: DC | PRN

## 2017-04-21 NOTE — Telephone Encounter (Signed)
meclizine (ANTIVERT) 12.5 MG tablet, refill request.

## 2017-04-28 ENCOUNTER — Ambulatory Visit
Admission: RE | Admit: 2017-04-28 | Discharge: 2017-04-28 | Disposition: A | Discharge status: Home / Self Care | Payer: Medicare Other | Source: Ambulatory Visit | Attending: General Surgery | Admitting: General Surgery

## 2017-04-28 DIAGNOSIS — C50512 Malignant neoplasm of lower-outer quadrant of left female breast: Secondary | ICD-10-CM

## 2017-04-28 MED ORDER — IOPAMIDOL (ISOVUE-300) INJECTION 61%
100.0000 mL | Freq: Once | INTRAVENOUS | Status: AC | PRN
  Administered 2017-04-28: 100 mL via INTRAVENOUS

## 2017-04-29 NOTE — Progress Notes (Signed)
Please let patient know CT negative.

## 2017-05-04 ENCOUNTER — Ambulatory Visit: Payer: Medicare Other | Attending: General Surgery | Admitting: Physical Therapy

## 2017-05-04 ENCOUNTER — Encounter: Payer: Self-pay | Admitting: Physical Therapy

## 2017-05-04 DIAGNOSIS — M545 Low back pain, unspecified: Secondary | ICD-10-CM

## 2017-05-04 DIAGNOSIS — G8929 Other chronic pain: Secondary | ICD-10-CM | POA: Diagnosis present

## 2017-05-04 NOTE — Therapy (Addendum)
[place] MAIN [facility] SERVICES [address] [place], [place], [address] Phone: [phone]   Fax:  [phone]  Physical Therapy Evaluation  Patient Details  Name: Cynthia Griffin MRN: [id number] Date of Birth: Oct 29, 1945 Referring Provider: Barry Dienes MD; Eliezer Lofts MD (PCP)  Encounter Date: 05/04/2017      PT End of Session - 05/04/17 1849    Visit Number 1   Number of Visits 13   Date for PT Re-Evaluation 06/15/17   Authorization Type G - code 1   Authorization Time Period 10   PT Start Time 1645   PT Stop Time 1746   PT Time Calculation (min) 61 min   Activity Tolerance Patient tolerated treatment well  Some pain with mobilization, resolved immediately following   Behavior During Therapy [facility] for tasks assessed/performed      Past Medical History:  Diagnosis Date  . Anemia    hx  . Anxiety   . Arthritis   . Breast cancer ([place]) 11/14/12   left, ER +, PR -, HER 2 -  . Carpal tunnel syndrome of right wrist   . Family history of anesthesia complication    pneumothorax - post op 1980's  . Hx of radiation therapy 05/09/13-06/22/13   left breast 60.4Gy  . Neuropathy    tips of fingers and left foot due to chemo  . Pneumonia   . Pneumothorax on right March, 2014   while having port-a-cath placed    Past Surgical History:  Procedure Laterality Date  . ABDOMINAL HYSTERECTOMY  1992   fibroids, total  . APPENDECTOMY    . AXILLARY LYMPH NODE DISSECTION Left 12/01/2012   Procedure: AXILLARY LYMPH NODE DISSECTION;  Surgeon: Stark Klein, MD;  Location: [place];  Service: General;  Laterality: Left;  . EYE SURGERY Bilateral 2006   lasik surgery  . LATISSIMUS FLAP TO BREAST Left 03/29/2014   dr Harlow Mares  . LATISSIMUS FLAP TO BREAST Left 03/29/2014   Procedure: LEFT LATISSIMUS FLAP;  Surgeon: Crissie Reese, MD;  Location: [place];  Service: Plastics;  Laterality: Left;  Marland Kitchen MASTECTOMY Left December 20, 2012  . MASTECTOMY W/  SENTINEL NODE BIOPSY Left 11/14/2012   Procedure: MASTECTOMY WITH SENTINEL LYMPH NODE BIOPSY;  Surgeon: Stark Klein, MD;  Location: [place];  Service: General;  Laterality: Left;  . MENISCUS REPAIR     Right Knee  . PORT-A-CATH REMOVAL N/A 05/02/2013   Procedure: REMOVAL PORT-A-CATH;  Surgeon: Stark Klein, MD;  Location: [place];  Service: General;  Laterality: N/A;  . PORTACATH PLACEMENT Right 12/01/2012   Procedure: INSERTION PORT-A-CATH;  Surgeon: Stark Klein, MD;  Location: [place];  Service: General;  Laterality: Right;  . SHOULDER ARTHROSCOPY Right 1/15   rotator cuff repair  . TONSILLECTOMY      There were no vitals filed for this visit.       Subjective Assessment - 05/04/17 1645    Subjective Pt reports she has been doing an exercise class for the last month and her pain has been getting worse; the pain resolves quickly when out of provocative position such as forward bending and lifting. Pt reports no radicular symptoms.    Pertinent History 71 y/o female with LBP starting in February 2018 that has been getting worse over that past few months; She reports pain is isolated to her left lower back that does not radiate into LEs. pt reports she tried a bootcamp exercise program about a month ago  that she quit due to worsening back pain. Pt has no recent history of back pain. Medical Hx significant for breast CA in 2014 with mastectomy and chemo. Pt had failed latissimus dorsi flap reconstruction surgery in 2015 with no related issues currently other than some left lower thoracic numbness. Pt had recent follow-up with MD and is currently cancer-free.    Limitations Standing   How long can you sit comfortably? Unlimited    How long can you stand comfortably? 2 hours   How long can you walk comfortably? Unlimited    Diagnostic tests 04/2017 CT abdomen and pelvis negative.   Patient Stated Goals Decrease pain, possibly return to exercise    Currently in Pain? Yes   Pain  Score 1    Pain Location Back   Pain Orientation Left;Lower   Pain Descriptors / Indicators Sharp;Pressure   Pain Type Chronic pain   Pain Radiating Towards NA   Pain Onset More than a month ago   Pain Frequency Intermittent   Aggravating Factors  Forward reaching movement, sudden movement    Pain Relieving Factors Rest    Effect of Pain on Daily Activities Decreased activity tolerance            [facility] PT Assessment - 05/05/17 0001      Assessment   Medical Diagnosis Low back pain   Referring Provider Barry Dienes MD; Eliezer Lofts MD (PCP)   Onset Date/Surgical Date 10/25/16   Hand Dominance Right   Next MD Visit Not scheduled   Prior Therapy Rotator cuff repair s/p 3 yrs, successful rehabilitation.      Precautions   Precautions None     Restrictions   Weight Bearing Restrictions No     Balance Screen   Has the patient fallen in the past 6 months No   Has the patient had a decrease in activity level because of a fear of falling?  No   Is the patient reluctant to leave their home because of a fear of falling?  No     Home Environment   Living Environment Private residence   Living Arrangements Spouse/significant other   Type of [place] Access Level entry   [place] None     Prior Function   Level of [place] Retired  Retired in 2004   Leisure Boot camp exercise class; [employer] volunteer; can require lifting objects and carrying them up stairs     Cognition   Overall Cognitive Status Within Functional Limits for tasks assessed         PROM/AROM:  Upper Extremity: WFL by gross assessment  Lower extremity: WFL by gross assessment  Spinal AROM:     Lumbar  Flexion 40 deg measured in standing        Lateral flexion  L 10 measured in standing         R 15 measured in standing        Rotation grossly limited, R worse than L with muscle spasm in         anterior R abdominal  muscle and back pain with return to neutral         from rotated position bilaterally                Extension: Grossly limited in sitting with pain in L low back        Palpation  PA mobs grade III-IV L1-4 reproduced pt's symptoms in left upper lumbar back. Low thoracic PA mobs and rib springing of left side lower 3-4 ribs also increase pt's pain up to a 7/10, which resolves immediately following mobilization. Pt had decreased pain with grade III mobs, which decreased further after treatment with several bouts.    STRENGTH:    Functionally assessed with 5x sit<>stand and transfers: Grossly pt's LE strength is WFL. Pt able to stand and transfer with ease. Further assessment required for core and spinal erector strength.   Sensory:  Light touch: Pt reports numbness in a small area in mid-thoracic back on L due to latissimus dorsi flap reconstruction surgery. Sensation intact in LEs  Observations:  Pt sits with mildly slumped posture with decreased lumbar lordosis, slightly rounded shoulders, and forward hear posture with prominent lower cervical hump. Pt has R convex thoracic scoliosis. She exhibits left shoulder higher than right, left hip is slightly higher than right;  R thoracic paraspinals are prominent with noted hypotrophy of L side latissimus dorsi. R rib cage is prominent.   BALANCE functionally assessed: Static Standing Balance  Normal Able to maintain standing balance against maximal resistance X  Good Able to maintain standing balance against moderate resistance   Good-/Fair+ Able to maintain standing balance against minimal resistance   Fair Able to stand unsupported without UE support and without LOB for 1-2 min   Fair- Requires Min A and UE support to maintain standing without loss of balance   Poor+ Requires mod A and UE support to maintain standing without loss of balance   Poor Requires max A and UE support to maintain standing balance without loss     Standing Dynamic  Balance  Normal Stand independently unsupported, able to weight shift and cross midline maximally X  Good Stand independently unsupported, able to weight shift and cross midline moderately   Good-/Fair+ Stand independently unsupported, able to weight shift across midline minimally   Fair Stand independently unsupported, weight shift, and reach ipsilaterally, loss of balance when crossing midline   Poor+ Able to stand with Min A and reach ipsilaterally, unable to weight shift   Poor+ Able to stand with Mod A and minimally reach ipsilaterally, unable to cross midline.     SPECIAL TESTS:   Adams forward bend test: Pt has right sided rib prominence in mid thoracic spine   FUNCTIONAL MOBILITY:   Transfers: Pt is independent with sit <> supine and sit <> prone transition with slightly decreased speed and mild difficulty. Pt has slight pain in shoulders with push up from prone position, but requires no assist.   With sit<>stand pt is able to stand with no UE assist with ease and no evidence of LOB.   Gait: Pt ambulated with normal gait speed with no sign of instability or LOB. By gross assessment pt appears to walk with anteriorly tilted pelvis. No gross abnormality of gait.   OUTCOME MEASURES: TEST Outcome Interpretation  5 times sit<>stand        11.81sec Pt has decreased risk for falls based on age >5.    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       Treatment:  Manual therapy    Grade III and IV PA mobs to L1-4 and T11-12, 2 bouts each x 30 sec per bout; pt had reproduction of her pain up to 7/10 with mobilization grade IV; pain decreased with grade III. Pain decreased throughout mobilization. Following mobs pt reported  decreased or eliminated pain with Lumbar AROM in all directions.     L sided rib mobilization grade III at rib 10-12 x 2 bouts each at 30 sec per bout. Pt had moderate pain with rib mobs, but reported it was tolerable.   Ther-ex:  Sitting:  Lumbar AROM x 10 each in flexion, ext, lateral flexion, and rotation. Pt had no pain with AROM by the end of exercise. HEP initiated with the following exercises to maximize benefit from mobilization.              Plan - 05/04/17 1851    Clinical Impression Statement 71 y/o female presents to therapy with a 6 mo Hx of LBP that has been getting worse recently. Pt has tried a boot camp exercise program, but was unable to continue due to worsening back pain. Pt assessed in AROM, spinal mobility, and with outcome measures including 5x sit<>stand, and 9m walk test. Pt's lumbar spine is generally hypomobile with multidirectional pain at end range. She has a R concave thoracic structural scoliosis that could be contributing to back pain, though pt does not have a recent Hx of back pain prior to this episode. With palpation and mobilization pt was found to have hypomobility at multiple segments including lower thoracic spine and L1-4. 5x sit<>stand and 24m walk indicate that pt is at a low fall risk and walks at a community ambulator gait speed. Pt was assessed using the modified Oswestry, which indicated pt is 16% disabled based on her low back function and pain. Pt's LBP was reproduced in the left upper lumbar back with grade III and IV mobilization of L1-4. Pt benefitted from mobilization and AROM exercise and had decreased pain with lumbar AROM following treatment. Pt will benefit from ther-ex and mobilization to decrease spinal hypomobility and pain. Pt is appropriate for skilled therapy at this time to address her deficits and to maximize her functional mobility and improve quality of life.   History and Personal Factors relevant to plan of care: (+) Pt has  high PLOF, is independent with transportation, and has a low level of perceived disability. (-) advanced age, chronicity of deficit, Hx of breast cancer and osteopenia   Clinical Presentation Stable   Clinical Presentation due to: back pain is localized to left lower back and is relieved with sitting and rest;    Clinical Decision Making Low   Rehab Potential Good   Clinical Impairments Affecting Rehab Potential Pt has structural scoliosis that could contribute to back pain.   PT Frequency 2x / week   PT Duration 6 weeks   PT Treatment/Interventions Biofeedback;Cryotherapy;Moist Heat;Stair training;Therapeutic exercise;Neuromuscular re-education;Patient/family education;Manual techniques;Passive range of motion;Dry needling   PT Next Visit Plan Further evaluate core and spinal extensor strength, advance HEP, bridges, bird dog, dead bug   PT Home Exercise Plan AROM for lumbar and thoracic spine, advance to stretching and core strength   Consulted and Agree with Plan of Care Patient      Patient will benefit from skilled therapeutic intervention in order to improve the following deficits and impairments:  Pain, Postural dysfunction, Impaired  sensation, Impaired perceived functional ability, Impaired flexibility, Increased muscle spasms, Hypomobility, Decreased range of motion  Visit Diagnosis: Chronic left-sided low back pain without sciatica - Plan: PT plan of care cert/re-cert      G-Codes - 96/28/36 1800    Functional Assessment Tool Used (Outpatient Only) 5x sit<>Stand, 10 meter walk, pain; modified oswestry   Functional Limitation Changing and maintaining body position   Mobility: Walking and Moving Around Current Status (O2947) --   Mobility: Walking and Moving Around Goal Status (M5465) --   Changing and Maintaining Body Position Current Status (K3546) At least 1 percent but less than 20 percent impaired, limited or restricted   Changing and Maintaining Body Position Goal Status  (F6812) At least 1 percent but less than 20 percent impaired, limited or restricted       Problem List Patient Active Problem List   Diagnosis Date Noted  . Counseling regarding end of life decision making 10/24/2014  . Osteopenia 11/01/2013  . Arthralgia 07/05/2013  . 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 Malignant neoplasm of lower-inner quadrant of female breast ([place]) 03/14/2013  . Lymphedema left arm 02/17/2013  . Breast cancer, left ([place]) 12/16/2012   Teddi Badalamenti M Evana Runnels, SPT This entire session was performed under direct supervision and direction of a licensed therapist/therapist assistant . I have personally read, edited and approve of the note as written.  Trotter,Margaret PT, DPT 05/05/2017, 1:56 PM  [place] MAIN [facility] SERVICES [address] [place], [place], [address] Phone: [phone]   Fax:  [phone]  Name: Sameerah Nachtigal MRN: [id number] Date of Birth: 08/26/1946

## 2017-05-06 ENCOUNTER — Encounter: Payer: Self-pay | Admitting: Physical Therapy

## 2017-05-06 ENCOUNTER — Ambulatory Visit: Payer: Medicare Other | Admitting: Physical Therapy

## 2017-05-06 DIAGNOSIS — M545 Low back pain: Principal | ICD-10-CM

## 2017-05-06 DIAGNOSIS — G8929 Other chronic pain: Secondary | ICD-10-CM

## 2017-05-06 NOTE — Patient Instructions (Addendum)
Bird Dog Pose - Intermediate    Keep pelvis stable. From table pose, step one leg back to plank pose. Reach opposite arm forward, back foot off floor.  Hold for ____ breaths. Return arm and leg at same rate. Repeat ____ times, alternating sides.  Copyright  VHI. All rights reserved.  Gymball: Hip Lift / Leg Lift (Sidelying)    Lie on right side with feet together on ball. Support head with hand. Lift hips then lift top leg. Repeat ___ times. Repeat on other side for set. Rest ___ seconds after set. Do ___ sets per session.  http://plyo.exer.us/172   Copyright  VHI. All rights reserved.  Lower Abdominal (Dead Bug): Strength    Get ON TARGET. Lie on full roller, legs in air. Bend and straighten legs at ___ inch level to maintain abdominal tension. Hold each position ___ seconds. Repeat ___ times. Do ___ sessions per day. Bring legs back close to body when tired.  Copyright  VHI. All rights reserved.  (Clinic) Cranial Rotation: Trunk Rotation, Resisted - Sitting    Pulley behind, arms crossed on chest, handle held around right shoulder in opposite hand, trunk rotated toward pulley, knees apart. Twist trunk forward, keeping hips still. Repeat ____ times per set. Do ____ sets per session. Do ____ sessions per week. Use ____ lb weights.  Copyright  VHI. All rights reserved.

## 2017-05-06 NOTE — Therapy (Signed)
[place] MAIN [facility] SERVICES [address] [place], [place], [address] Phone: [phone]   Fax:  [phone]  Physical Therapy Treatment  Patient Details  Name: Cynthia Griffin MRN: [id number] Date of Birth: 09-02-1946 Referring Provider: Barry Dienes MD; Eliezer Lofts MD (PCP)  Encounter Date: 05/06/2017      PT End of Session - 05/06/17 1910    Visit Number 2   Number of Visits 13   Date for PT Re-Evaluation 06/15/17   Authorization Type G - code 2   Authorization Time Period 10   PT Start Time 3762   PT Stop Time 1430   PT Time Calculation (min) 45 min   Activity Tolerance Patient tolerated treatment well;No increased pain  Some pain with mobilization, resolved immediately following   Behavior During Therapy [facility] for tasks assessed/performed      Past Medical History:  Diagnosis Date  . Anemia    hx  . Anxiety   . Arthritis   . Breast cancer ([place]) 11/14/12   left, ER +, PR -, HER 2 -  . Carpal tunnel syndrome of right wrist   . Family history of anesthesia complication    pneumothorax - post op 1980's  . Hx of radiation therapy 05/09/13-06/22/13   left breast 60.4Gy  . Neuropathy    tips of fingers and left foot due to chemo  . Pneumonia   . Pneumothorax on right March, 2014   while having port-a-cath placed    Past Surgical History:  Procedure Laterality Date  . ABDOMINAL HYSTERECTOMY  1992   fibroids, total  . APPENDECTOMY    . AXILLARY LYMPH NODE DISSECTION Left 12/01/2012   Procedure: AXILLARY LYMPH NODE DISSECTION;  Surgeon: Stark Klein, MD;  Location: [place];  Service: General;  Laterality: Left;  . EYE SURGERY Bilateral 2006   lasik surgery  . LATISSIMUS FLAP TO BREAST Left 03/29/2014   dr Harlow Mares  . LATISSIMUS FLAP TO BREAST Left 03/29/2014   Procedure: LEFT LATISSIMUS FLAP;  Surgeon: Crissie Reese, MD;  Location: [place];  Service: Plastics;  Laterality: Left;  Marland Kitchen MASTECTOMY Left December 20, 2012  .  MASTECTOMY W/ SENTINEL NODE BIOPSY Left 11/14/2012   Procedure: MASTECTOMY WITH SENTINEL LYMPH NODE BIOPSY;  Surgeon: Stark Klein, MD;  Location: [place];  Service: General;  Laterality: Left;  . MENISCUS REPAIR     Right Knee  . PORT-A-CATH REMOVAL N/A 05/02/2013   Procedure: REMOVAL PORT-A-CATH;  Surgeon: Stark Klein, MD;  Location: [place];  Service: General;  Laterality: N/A;  . PORTACATH PLACEMENT Right 12/01/2012   Procedure: INSERTION PORT-A-CATH;  Surgeon: Stark Klein, MD;  Location: [place];  Service: General;  Laterality: Right;  . SHOULDER ARTHROSCOPY Right 1/15   rotator cuff repair  . TONSILLECTOMY      There were no vitals filed for this visit.      Subjective Assessment - 05/06/17 1345    Subjective Pt reports she had some soreness after our last session, which resolved within a day or two. Pt reports no other changes. Pain is no better or worse. No pain at this time.   Pertinent History 71 y/o female with LBP starting in February 2018 that has been getting worse over that past few months; She reports pain is isolated to her left lower back that does not radiate into LEs. pt reports she tried a bootcamp exercise program about a month ago that she quit due to worsening back  pain. Pt has no recent history of back pain. Medical Hx significant for breast CA in 2014 with mastectomy and chemo. Pt had failed latissimus dorsi flap reconstruction surgery in 2015 with no related issues currently other than some left lower thoracic numbness. Pt had recent follow-up with MD and is currently cancer-free.    Limitations Standing   How long can you sit comfortably? Unlimited    How long can you stand comfortably? 2 hours   How long can you walk comfortably? Unlimited    Diagnostic tests 04/2017 CT abdomen and pelvis negative.   Patient Stated Goals Decrease pain, possibly return to exercise    Currently in Pain? No/denies   Pain Onset More than a month ago        Treatment:    Ther-ex:  Supine:  Assess core strength: pt unable to perform 1 sit up with outreached arms. (Grossly 2-/5)   Dead bug with alternating arm elevation and leg extension with large therapy ball on abdomen for positioning cues   2 x 10 reps alternating cues to decrease LE height from table for increased challenge   1 x 20 alternating. Cues to avoid breath holding   Bird dog with single limb elevation and 5 sec hold x 4 on each limb in alternating pattern     X 12 with double limb contralateral UE and LE alternating 5-10 sec hold and deep breathing, cues to keep hips and shoulder level, increased glut contraction, cues for straight wrist to avoid wrist pain  Sitting:    Trunk rotation with cross-body reach, cues to take 5 slow and deep breaths for thoracic stretch and to increase  trunk rotation slightly with each exhale while reaching cross body for extrinsic cue. Pt reported her shoulders were fatigued after several reps; pt cued to rest hand on shoulder and attempt to look farther in the direction of rotation   Sidelying:    Hip abduction 2 x 10 each side; cues for further hip extension to avoid TFL compensation.         PT Education - 05/06/17 1910    Education provided Yes   Education Details ther-ex, HEP advanced   Person(s) Educated Patient   Methods Explanation;Demonstration;Tactile cues;Verbal cues;Handout   Comprehension Tactile cues required;Verbal cues required;Returned demonstration;Verbalized understanding          PT Short Term Goals - 05/04/17 1944      PT SHORT TERM GOAL #1   Title Pt will perform HEP 3/7 days during first 4 weeks of therapy to maximize her improvement in functional mobility and safety   Baseline HEP new   Time 4   Period Weeks   Status New   Target Date 06/01/17     PT SHORT TERM GOAL #2   Title Pt will report worst pain in last 24 hours of 5/10 to indicate a significant improvement and tolerance of ADLs.    Baseline  worst pain 9/10   Time 4   Period Weeks   Status New   Target Date 06/01/17           PT Long Term Goals - 05/04/17 1947      PT LONG TERM GOAL #1   Title Patient will report a worst pain of 3/10 on VAS in to improve tolerance with ADLs and reduced symptoms with activities.    Baseline 9/10 at worst   Time 6   Period Weeks   Status New   Target Date 06/15/17  PT LONG TERM GOAL #2   Title  Patient will demonstrate an increase in lumbar spine AROM by 10 deg in all directions to enable her to perform ADLs such as bending, reaching, and lifting without limitation.    Baseline Flexion 40 deg, lateral flexion L 10 deg, R 15 deg. Pain with forward reaching and bending (05/04/17)   Time 6   Period Weeks   Status New   Target Date 06/15/17     PT LONG TERM GOAL #3   Title Patient will reduce modified Oswestry score to <4% disabled as to demonstrate minimal disability and meaningful improvement with ADLs including improved sleeping tolerance, walking/sitting tolerance etc for better mobility with ADLs.    Baseline 16% disabled    Time 6   Period Weeks   Status New   Target Date 06/15/17     PT LONG TERM GOAL #4   Title Patient will be able to carry up to a 20# object up a flight of stairs independently with pain less than 3/10 for independence with volunteering duties related to [employer], such as carrying a case of water up the stairs..    Baseline Pain with lifting up to 9/10   Time 6   Period Weeks   Status New   Target Date 06/15/17               Plan - 05/06/17 1910    Clinical Impression Statement Pt instructed in advanced ther-ex today for general care and back strengthening and for increased thoracic mobility. Pt responded well to exercise with no increased pain and good form with minimal cueing. Pt's HEP advanced with bird dog, dead bug, and thoracic rotation exercises. Pt will continue to benefit from skilled therapy to decrease her pain and  hypomobility.   Rehab Potential Good   Clinical Impairments Affecting Rehab Potential Pt has structural scoliosis that could contribute to back pain.   PT Frequency 2x / week   PT Duration 6 weeks   PT Treatment/Interventions Biofeedback;Cryotherapy;Moist Heat;Stair training;Therapeutic exercise;Neuromuscular re-education;Patient/family education;Manual techniques;Passive range of motion;Dry needling   PT Next Visit Plan Further evaluate core and spinal extensor strength, advance HEP, bridges, bird dog, dead bug   PT Home Exercise Plan AROM for lumbar and thoracic spine, advance to stretching and core strength   Consulted and Agree with Plan of Care Patient      Patient will benefit from skilled therapeutic intervention in order to improve the following deficits and impairments:  Pain, Postural dysfunction, Impaired sensation, Impaired perceived functional ability, Impaired flexibility, Increased muscle spasms, Hypomobility, Decreased range of motion  Visit Diagnosis: Chronic 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 Counseling regarding end of life decision making 10/24/2014  . Osteopenia 11/01/2013  . Arthralgia 07/05/2013  . Depression, major, in partial remission ([place]) 04/12/2013  . Chemotherapy-induced neuropathy ([place]) 04/04/2013  . Malignant neoplasm of lower-inner quadrant of female breast ([place]) 03/14/2013  . Lymphedema left arm 02/17/2013  . Breast cancer, left ([place]) 12/16/2012   Terris Germano M Sihaam Chrobak, SPT This entire session was performed under direct supervision and direction of a licensed therapist/therapist assistant . I have personally read, edited and approve of the note as written.   Trotter,Margaret PT, DPT 05/07/2017, 9:03 AM  [place] MAIN [facility] SERVICES [address] [place], [place], [address] Phone: [phone]   Fax:  [phone]  Name: Rickeya Manus  [place] MRN: [id number] Date of Birth: April 20, 1946

## 2017-05-11 ENCOUNTER — Ambulatory Visit: Payer: Medicare Other | Attending: General Surgery | Admitting: Physical Therapy

## 2017-05-11 ENCOUNTER — Encounter: Payer: Self-pay | Admitting: Physical Therapy

## 2017-05-11 DIAGNOSIS — M545 Low back pain, unspecified: Secondary | ICD-10-CM

## 2017-05-11 DIAGNOSIS — G8929 Other chronic pain: Secondary | ICD-10-CM | POA: Diagnosis present

## 2017-05-11 NOTE — Patient Instructions (Addendum)
Log Roll    Lying on back, bend left knee and place left arm across chest. Roll all in one movement to the right. Reverse to roll to the left. Always move as one unit. Try to avoid twisting when getting in/out of bed;    Copyright  VHI. All rights reserved.  Sitting: One Leg / Opposite Arm    Sitting on ball, Start with alternate raising one arm and then the other; progress to alternate LE march, then do alternate raise one leg and opposite arm. Repeat with other limbs. Hold each position _2___ seconds. Do ___10_ repetitions, alternating, ___1_ sets.  http://bt.exer.us/379    Hip Circle    Circle hips in one direction _5__ times. Repeat in the other direction. Repeat __5_ times, each direction. Do _1__ times per day.  Copyright  VHI. All rights reserved.

## 2017-05-11 NOTE — Therapy (Signed)
[place] MAIN [facility] SERVICES [address] [place], [place], [address] Phone: [phone]   Fax:  [phone]  Physical Therapy Treatment  Patient Details  Name: Cynthia Griffin MRN: [id number] Date of Birth: Aug 24, 1946 Referring Provider: Barry Dienes MD; Eliezer Lofts MD (PCP)  Encounter Date: 05/11/2017      PT End of Session - 05/11/17 1614    Visit Number 3   Number of Visits 13   Date for PT Re-Evaluation 06/15/17   Authorization Type G - code 3   Authorization Time Period 10   PT Start Time 7619   PT Stop Time 5093   PT Time Calculation (min) 40 min   Activity Tolerance Patient tolerated treatment well;No increased pain  Some pain with mobilization, resolved immediately following   Behavior During Therapy [facility] for tasks assessed/performed      Past Medical History:  Diagnosis Date  . Anemia    hx  . Anxiety   . Arthritis   . Breast cancer ([place]) 11/14/12   left, ER +, PR -, HER 2 -  . Carpal tunnel syndrome of right wrist   . Family history of anesthesia complication    pneumothorax - post op 1980's  . Hx of radiation therapy 05/09/13-06/22/13   left breast 60.4Gy  . Neuropathy    tips of fingers and left foot due to chemo  . Pneumonia   . Pneumothorax on right March, 2014   while having port-a-cath placed    Past Surgical History:  Procedure Laterality Date  . ABDOMINAL HYSTERECTOMY  1992   fibroids, total  . APPENDECTOMY    . AXILLARY LYMPH NODE DISSECTION Left 12/01/2012   Procedure: AXILLARY LYMPH NODE DISSECTION;  Surgeon: Stark Klein, MD;  Location: [place];  Service: General;  Laterality: Left;  . EYE SURGERY Bilateral 2006   lasik surgery  . LATISSIMUS FLAP TO BREAST Left 03/29/2014   dr Harlow Mares  . LATISSIMUS FLAP TO BREAST Left 03/29/2014   Procedure: LEFT LATISSIMUS FLAP;  Surgeon: Crissie Reese, MD;  Location: [place];  Service: Plastics;  Laterality: Left;  Marland Kitchen MASTECTOMY Left December 20, 2012  .  MASTECTOMY W/ SENTINEL NODE BIOPSY Left 11/14/2012   Procedure: MASTECTOMY WITH SENTINEL LYMPH NODE BIOPSY;  Surgeon: Stark Klein, MD;  Location: [place];  Service: General;  Laterality: Left;  . MENISCUS REPAIR     Right Knee  . PORT-A-CATH REMOVAL N/A 05/02/2013   Procedure: REMOVAL PORT-A-CATH;  Surgeon: Stark Klein, MD;  Location: [place];  Service: General;  Laterality: N/A;  . PORTACATH PLACEMENT Right 12/01/2012   Procedure: INSERTION PORT-A-CATH;  Surgeon: Stark Klein, MD;  Location: [place];  Service: General;  Laterality: Right;  . SHOULDER ARTHROSCOPY Right 1/15   rotator cuff repair  . TONSILLECTOMY      There were no vitals filed for this visit.      Subjective Assessment - 05/11/17 1613    Subjective Patient reports no change in pain since last session; She reports no pain while doing exercise but still has discomfort with certain movements especially when lying down or sitting up.    Pertinent History 71 y/o female with LBP starting in February 2018 that has been getting worse over that past few months; She reports pain is isolated to her left lower back that does not radiate into LEs. pt reports she tried a bootcamp exercise program about a month ago that she quit due to worsening back pain. Pt  has no recent history of back pain. Medical Hx significant for breast CA in 2014 with mastectomy and chemo. Pt had failed latissimus dorsi flap reconstruction surgery in 2015 with no related issues currently other than some left lower thoracic numbness. Pt had recent follow-up with MD and is currently cancer-free.    Limitations Standing   How long can you sit comfortably? Unlimited    How long can you stand comfortably? 2 hours   How long can you walk comfortably? Unlimited    Diagnostic tests 04/2017 CT abdomen and pelvis negative.   Patient Stated Goals Decrease pain, possibly return to exercise    Currently in Pain? No/denies   Pain Onset More than a month ago          TREATMENT: PT educated patient in log roll technique to reduce back pain with sit<>Supine; Patient required min Vcs for correct positioning and to avoid rotation;  Hooklying: Pball lumbar trunk rotation x15 reps each direction with cues to avoid painful ROM; Pball double knee to chest 2 sec hold x5 reps to facilitate lumbar flexion stretch; Hooklying with pball over knees, arms on pball, deep breath in, breathe out while pushing down into pball to facilitate better core abdominal contraction; required min VCs for positioning to improve core activation;  Posterior pelvic tilt 5 sec hold x5 reps;  Posterior pelvic tilt with BLE leg lift hip/knee 90/90, lifting one leg at a time x5 sec hold x5 reps. Patient required min-moderate verbal/tactile cues for correct exercise technique and to increase core abdominal stabilization with UE/LE movement  Sidelying: Green tband hip abduction clamshells 2x10 with cues for positioning to improve hip abductor strengthening;  Sitting on pball: Gentle bounce to facilitate better spinal decompression x1 min; Alternate UE lift x5 reps each;  Alternate LE lift march x10 bilaterally; Alternate UE/LE lift (dead bug) with cues for core stabilization to reduce trunk movement x5 each;  Pelvic rocks forward/backward x5 each direction; Pelvic rocks side/side x5 each direction; Pelvic circles clockwise/counterclockwise x5 each direction; Patient required mod VCs for positioning and to avoid slump/slouch (reduce upper trunk compensation)   Advanced HEP- see patient instructions;                     PT Education - 05/11/17 1614    Education provided Yes   Education Details HEP reinforced, core strengthening/stabilization, body mechanics;    Person(s) Educated Patient   Methods Explanation;Demonstration;Verbal cues   Comprehension Verbalized understanding;Returned demonstration;Verbal cues required;Need further instruction           PT Short Term Goals - 05/04/17 1944      PT SHORT TERM GOAL #1   Title Pt will perform HEP 3/7 days during first 4 weeks of therapy to maximize her improvement in functional mobility and safety   Baseline HEP new   Time 4   Period Weeks   Status New   Target Date 06/01/17     PT SHORT TERM GOAL #2   Title Pt will report worst pain in last 24 hours of 5/10 to indicate a significant improvement and tolerance of ADLs.    Baseline worst pain 9/10   Time 4   Period Weeks   Status New   Target Date 06/01/17           PT Long Term Goals - 05/04/17 1947      PT LONG TERM GOAL #1   Title Patient will report a worst pain of 3/10 on VAS in  to improve tolerance with ADLs and reduced symptoms with activities.    Baseline 9/10 at worst   Time 6   Period Weeks   Status New   Target Date 06/15/17     PT LONG TERM GOAL #2   Title  Patient will demonstrate an increase in lumbar spine AROM by 10 deg in all directions to enable her to perform ADLs such as bending, reaching, and lifting without limitation.    Baseline Flexion 40 deg, lateral flexion L 10 deg, R 15 deg. Pain with forward reaching and bending (05/04/17)   Time 6   Period Weeks   Status New   Target Date 06/15/17     PT LONG TERM GOAL #3   Title Patient will reduce modified Oswestry score to <4% disabled as to demonstrate minimal disability and meaningful improvement with ADLs including improved sleeping tolerance, walking/sitting tolerance etc for better mobility with ADLs.    Baseline 16% disabled    Time 6   Period Weeks   Status New   Target Date 06/15/17     PT LONG TERM GOAL #4   Title Patient will be able to carry up to a 20# object up a flight of stairs independently with pain less than 3/10 for independence with volunteering duties related to [employer], such as carrying a case of water up the stairs..    Baseline Pain with lifting up to 9/10   Time 6   Period Weeks   Status New   Target Date 06/15/17                Plan - 05/11/17 1620    Clinical Impression Statement Patient instructed in advanced core stabilization exercise to facilitate better core control and reduce back pain; she required min VCs to improve positioning and core activation for  better back stability. She had increased difficulty with BLE leg lift due to core weakness. Patient denies any increase in pain with advanced exercise. Patient would benefit from additional skilled PT Intervention to improve strength, balance and gait safety;    Rehab Potential Good   Clinical Impairments Affecting Rehab Potential Pt has structural scoliosis that could contribute to back pain.   PT Frequency 2x / week   PT Duration 6 weeks   PT Treatment/Interventions Biofeedback;Cryotherapy;Moist Heat;Stair training;Therapeutic exercise;Neuromuscular re-education;Patient/family education;Manual techniques;Passive range of motion;Dry needling   PT Next Visit Plan Further evaluate core and spinal extensor strength, advance HEP, bridges, bird dog, dead bug   PT Home Exercise Plan AROM for lumbar and thoracic spine, advance to stretching and core strength   Consulted and Agree with Plan of Care Patient      Patient will benefit from skilled therapeutic intervention in order to improve the following deficits and impairments:  Pain, Postural dysfunction, Impaired sensation, Impaired perceived functional ability, Impaired flexibility, Increased muscle spasms, Hypomobility, Decreased range of motion  Visit Diagnosis: Chronic 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 Counseling regarding end of life decision making 10/24/2014  . Osteopenia 11/01/2013  . Arthralgia 07/05/2013  . Depression, major, in partial remission ([place]) 04/12/2013  . Chemotherapy-induced neuropathy ([place]) 04/04/2013  . 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  Lymphedema left arm 02/17/2013  . Breast cancer, left ([place]) 12/16/2012    Ronav Furney PT, DPT 05/11/2017, 5:20 PM  [place] [facility]IN REHAB SERVICES [place]  [place], [place], [address] Phone: [phone]   Fax:  [phone]  Name: Cynthia Griffin MRN: [id number] Date of Birth: 12-17-1945

## 2017-05-13 ENCOUNTER — Ambulatory Visit: Payer: Medicare Other | Admitting: Physical Therapy

## 2017-05-13 ENCOUNTER — Encounter: Payer: Self-pay | Admitting: Physical Therapy

## 2017-05-13 DIAGNOSIS — G8929 Other chronic pain: Secondary | ICD-10-CM

## 2017-05-13 DIAGNOSIS — M545 Low back pain, unspecified: Secondary | ICD-10-CM

## 2017-05-13 NOTE — Therapy (Signed)
[place] MAIN [facility] SERVICES [address] [place], [place], [address] Phone: [phone]   Fax:  [phone]  Physical Therapy Treatment  Patient Details  Name: Cynthia Griffin MRN: [id number] Date of Birth: 1945-10-18 Referring Provider: Barry Dienes MD; Eliezer Lofts MD (PCP)  Encounter Date: 05/13/2017      PT End of Session - 05/13/17 1654    Visit Number 4   Number of Visits 13   Date for PT Re-Evaluation 06/15/17   Authorization Type G - code 4   Authorization Time Period 10   PT Start Time 1602   PT Stop Time 8416   PT Time Calculation (min) 43 min   Activity Tolerance Patient tolerated treatment well;No increased pain  Some pain with mobilization, resolved immediately following   Behavior During Therapy [facility] for tasks assessed/performed      Past Medical History:  Diagnosis Date  . Anemia    hx  . Anxiety   . Arthritis   . Breast cancer ([place]) 11/14/12   left, ER +, PR -, HER 2 -  . Carpal tunnel syndrome of right wrist   . Family history of anesthesia complication    pneumothorax - post op 1980's  . Hx of radiation therapy 05/09/13-06/22/13   left breast 60.4Gy  . Neuropathy    tips of fingers and left foot due to chemo  . Pneumonia   . Pneumothorax on right March, 2014   while having port-a-cath placed    Past Surgical History:  Procedure Laterality Date  . ABDOMINAL HYSTERECTOMY  1992   fibroids, total  . APPENDECTOMY    . AXILLARY LYMPH NODE DISSECTION Left 12/01/2012   Procedure: AXILLARY LYMPH NODE DISSECTION;  Surgeon: Stark Klein, MD;  Location: [place];  Service: General;  Laterality: Left;  . EYE SURGERY Bilateral 2006   lasik surgery  . LATISSIMUS FLAP TO BREAST Left 03/29/2014   dr Harlow Mares  . LATISSIMUS FLAP TO BREAST Left 03/29/2014   Procedure: LEFT LATISSIMUS FLAP;  Surgeon: Crissie Reese, MD;  Location: [place];  Service: Plastics;  Laterality: Left;  Marland Kitchen MASTECTOMY Left December 20, 2012  .  MASTECTOMY W/ SENTINEL NODE BIOPSY Left 11/14/2012   Procedure: MASTECTOMY WITH SENTINEL LYMPH NODE BIOPSY;  Surgeon: Stark Klein, MD;  Location: [place];  Service: General;  Laterality: Left;  . MENISCUS REPAIR     Right Knee  . PORT-A-CATH REMOVAL N/A 05/02/2013   Procedure: REMOVAL PORT-A-CATH;  Surgeon: Stark Klein, MD;  Location: [place];  Service: General;  Laterality: N/A;  . PORTACATH PLACEMENT Right 12/01/2012   Procedure: INSERTION PORT-A-CATH;  Surgeon: Stark Klein, MD;  Location: [place];  Service: General;  Laterality: Right;  . SHOULDER ARTHROSCOPY Right 1/15   rotator cuff repair  . TONSILLECTOMY      There were no vitals filed for this visit.      Subjective Assessment - 05/13/17 1605    Subjective Patient still reports no change in pain since last session; she is still having stabbing pains intermittantly with some movements and positions, but cannot elaborate.  She reports no pain in supine at start of session.    Pertinent History 71 y/o female with LBP starting in February 2018 that has been getting worse over that past few months; She reports pain is isolated to her left lower back that does not radiate into LEs. pt reports she tried a bootcamp exercise program about a month ago that she quit  due to worsening back pain. Pt has no recent history of back pain. Medical Hx significant for breast CA in 2014 with mastectomy and chemo. Pt had failed latissimus dorsi flap reconstruction surgery in 2015 with no related issues currently other than some left lower thoracic numbness. Pt had recent follow-up with MD and is currently cancer-free.    Limitations Standing   How long can you sit comfortably? Unlimited    How long can you stand comfortably? 2 hours   How long can you walk comfortably? Unlimited    Diagnostic tests 04/2017 CT abdomen and pelvis negative.   Patient Stated Goals Decrease pain, possibly return to exercise    Currently in Pain? No/denies   Pain  Onset More than a month ago        Treatment:  Ther-ex:  Hooklying:  Lumbar trunk rotation with therapy ball supporting LEs; cues for deep breathing at end range x 5 breaths and to increase lumbar rotation slightly on exhale with each breath x 3 reps each direction. Pt had contralateral posterior thoracic discomfort at end range that decreased with continued exercise   Sidelying hip abd x 15 with 3# weight, cues to avoid hip flexion and external rotation; pt instructed to add this exercise to HEP; handout given.       Dead bug re- instructed with alternating arm elevation and leg extension: pt performed well with min cues to increase hold time.   Bridges attempted, but discontinued due to this being a provocative movement for pt's LBP, greater on L than R.    Qped:              Bird dog re-instructed with double contralateral UE/LE elevation with 5 sec hold; pt performed well with min cues to avoid scapular winging by increasing shoulder protraction during exercise.                 Manual therapy:   Prone:  Soft tissue mobilization and trigger point release to thoracic paraspinals x 6-8 min. Pt had some trigger point areas in spinal erectors on L side near T12 that had decreased sensitivity following treatment.    Crossfriction massage x 4-5 min over scar from lat flap surgery in L posterior thorax. Pt had soft tissue adhesions under scar in there area of her back pain. Pt educated in crossfriction massage.          PT Short Term Goals - 05/04/17 1944      PT SHORT TERM GOAL #1   Title Pt will perform HEP 3/7 days during first 4 weeks of therapy to maximize her improvement in functional mobility and safety   Baseline HEP new   Time 4   Period Weeks   Status New   Target Date 06/01/17     PT SHORT TERM GOAL #2   Title Pt will report worst pain in last 24 hours of 5/10 to indicate a significant improvement and tolerance of ADLs.    Baseline worst pain 9/10   Time 4    Period Weeks   Status New   Target Date 06/01/17           PT Long Term Goals - 05/04/17 1947      PT LONG TERM GOAL #1   Title Patient will report a worst pain of 3/10 on VAS in to improve tolerance with ADLs and reduced symptoms with activities.    Baseline 9/10 at worst   Time 6   Period Weeks  Status New   Target Date 06/15/17     PT LONG TERM GOAL #2   Title  Patient will demonstrate an increase in lumbar spine AROM by 10 deg in all directions to enable her to perform ADLs such as bending, reaching, and lifting without limitation.    Baseline Flexion 40 deg, lateral flexion L 10 deg, R 15 deg. Pain with forward reaching and bending (05/04/17)   Time 6   Period Weeks   Status New   Target Date 06/15/17     PT LONG TERM GOAL #3   Title Patient will reduce modified Oswestry score to <4% disabled as to demonstrate minimal disability and meaningful improvement with ADLs including improved sleeping tolerance, walking/sitting tolerance etc for better mobility with ADLs.    Baseline 16% disabled    Time 6   Period Weeks   Status New   Target Date 06/15/17     PT LONG TERM GOAL #4   Title Patient will be able to carry up to a 20# object up a flight of stairs independently with pain less than 3/10 for independence with volunteering duties related to [employer], such as carrying a case of water up the stairs..    Baseline Pain with lifting up to 9/10   Time 6   Period Weeks   Status New   Target Date 06/15/17               Plan - 05/13/17 1655    Clinical Impression Statement Pt instructed in ther-ex for core and LE strengthening with manual therapy for L thoracic spinal erectors. Pt had some mild trigger points in L thoracic paraspinals which responded well to manual therapy, though pt had no reduction in back pain following manual therapy. Pt's pain is increased with resisted hip extension in supine, prone, and with sit>stand, though she has no pain with lumbar  extension. Pt will benefit from continued therapy to further identify and treat her LBP, and to maximize her functional mobility   Rehab Potential Good   Clinical Impairments Affecting Rehab Potential Pt has structural scoliosis that could contribute to back pain.   PT Frequency 2x / week   PT Duration 6 weeks   PT Treatment/Interventions Biofeedback;Cryotherapy;Moist Heat;Stair training;Therapeutic exercise;Neuromuscular re-education;Patient/family education;Manual techniques;Passive range of motion;Dry needling   PT Next Visit Plan Further evaluate core and spinal extensor strength, advance HEP, bridges, bird dog, dead bug   PT Home Exercise Plan AROM for lumbar and thoracic spine, advance to stretching and core strength   Consulted and Agree with Plan of Care Patient      Patient will benefit from skilled therapeutic intervention in order to improve the following deficits and impairments:  Pain, Postural dysfunction, Impaired sensation, Impaired perceived functional ability, Impaired flexibility, Increased muscle spasms, Hypomobility, Decreased range of motion  Visit Diagnosis: Chronic 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 Counseling regarding end of life decision making 10/24/2014  . Osteopenia 11/01/2013  . Arthralgia 07/05/2013  . Depression, major, in partial remission ([place]) 04/12/2013  . Chemotherapy-induced neuropathy ([place]) 04/04/2013  . Malignant neoplasm of lower-inner quadrant of female breast ([place]) 03/14/2013  . Lymphedema left arm 02/17/2013  . Breast cancer, left ([place]) 12/16/2012   Cynthia Griffin M Kip Kautzman, SPT This entire session was performed under direct supervision and direction of a licensed therapist/therapist assistant . I have personally read, edited and approve of the note  as written.  Trotter,Margaret PT, DPT 05/13/2017, 5:57 PM  [place] MAIN [facility]  SERVICES [address] [place], [place], [address] Phone: [phone]   Fax:  [phone]  Name: Cynthia Griffin MRN: [id number] Date of Birth: 04-08-1946

## 2017-05-13 NOTE — Patient Instructions (Addendum)
Gymball: Hip Lift / Leg Lift (Sidelying)    Lie on right side with feet together NOT on a ball. Support head with hand. Lift hips then lift top leg. Repeat _15__ times with 2# ankle weight. Repeat on other side for set. Rest _30-60__ seconds after set. Do __2_ sets per session.  http://plyo.exer.us/172   Copyright  VHI. All rights reserved.

## 2017-05-18 ENCOUNTER — Ambulatory Visit: Payer: Medicare Other | Admitting: Physical Therapy

## 2017-05-18 ENCOUNTER — Encounter: Payer: Self-pay | Admitting: Physical Therapy

## 2017-05-18 DIAGNOSIS — M545 Low back pain, unspecified: Secondary | ICD-10-CM

## 2017-05-18 DIAGNOSIS — G8929 Other chronic pain: Secondary | ICD-10-CM

## 2017-05-18 NOTE — Therapy (Signed)
[place] MAIN [facility] SERVICES [address] [place], [place], [address] Phone: [phone]   Fax:  [phone]  Physical Therapy Treatment  Patient Details  Name: Cynthia Griffin MRN: [id number] Date of Birth: 11/15/1945 Referring Provider: Barry Dienes MD; Eliezer Lofts MD (PCP)  Encounter Date: 05/18/2017      PT End of Session - 05/18/17 1625    Visit Number 5   Number of Visits 13   Date for PT Re-Evaluation 06/15/17   Authorization Type G - code 5   Authorization Time Period 10   PT Start Time 1604   PT Stop Time 6160   PT Time Calculation (min) 40 min   Activity Tolerance Patient tolerated treatment well;No increased pain  Some pain with mobilization, resolved immediately following   Behavior During Therapy [facility] for tasks assessed/performed      Past Medical History:  Diagnosis Date  . Anemia    hx  . Anxiety   . Arthritis   . Breast cancer ([place]) 11/14/12   left, ER +, PR -, HER 2 -  . Carpal tunnel syndrome of right wrist   . Family history of anesthesia complication    pneumothorax - post op 1980's  . Hx of radiation therapy 05/09/13-06/22/13   left breast 60.4Gy  . Neuropathy    tips of fingers and left foot due to chemo  . Pneumonia   . Pneumothorax on right March, 2014   while having port-a-cath placed    Past Surgical History:  Procedure Laterality Date  . ABDOMINAL HYSTERECTOMY  1992   fibroids, total  . APPENDECTOMY    . AXILLARY LYMPH NODE DISSECTION Left 12/01/2012   Procedure: AXILLARY LYMPH NODE DISSECTION;  Surgeon: Stark Klein, MD;  Location: [place];  Service: General;  Laterality: Left;  . EYE SURGERY Bilateral 2006   lasik surgery  . LATISSIMUS FLAP TO BREAST Left 03/29/2014   dr Harlow Mares  . LATISSIMUS FLAP TO BREAST Left 03/29/2014   Procedure: LEFT LATISSIMUS FLAP;  Surgeon: Crissie Reese, MD;  Location: [place];  Service: Plastics;  Laterality: Left;  Marland Kitchen MASTECTOMY Left December 20, 2012  .  MASTECTOMY W/ SENTINEL NODE BIOPSY Left 11/14/2012   Procedure: MASTECTOMY WITH SENTINEL LYMPH NODE BIOPSY;  Surgeon: Stark Klein, MD;  Location: [place];  Service: General;  Laterality: Left;  . MENISCUS REPAIR     Right Knee  . PORT-A-CATH REMOVAL N/A 05/02/2013   Procedure: REMOVAL PORT-A-CATH;  Surgeon: Stark Klein, MD;  Location: [place];  Service: General;  Laterality: N/A;  . PORTACATH PLACEMENT Right 12/01/2012   Procedure: INSERTION PORT-A-CATH;  Surgeon: Stark Klein, MD;  Location: [place];  Service: General;  Laterality: Right;  . SHOULDER ARTHROSCOPY Right 1/15   rotator cuff repair  . TONSILLECTOMY      There were no vitals filed for this visit.      Subjective Assessment - 05/18/17 1607    Subjective             Patient reports that her pain has decreased since last session with less frequent episodes and fewer provocative posture   Pertinent History 71 y/o female with LBP starting in February 2018 that has been getting worse over that past few months; She reports pain is isolated to her left lower back that does not radiate into LEs. pt reports she tried a bootcamp exercise program about a month ago that she quit due to worsening back pain. Pt has  no recent history of back pain. Medical Hx significant for breast CA in 2014 with mastectomy and chemo. Pt had failed latissimus dorsi flap reconstruction surgery in 2015 with no related issues currently other than some left lower thoracic numbness. Pt had recent follow-up with MD and is currently cancer-free.    Limitations Standing   How long can you sit comfortably? Unlimited    How long can you stand comfortably? 2 hours   How long can you walk comfortably? Unlimited    Diagnostic tests 04/2017 CT abdomen and pelvis negative.   Patient Stated Goals Decrease pain, possibly return to exercise    Currently in Pain? No/denies   Pain Onset More than a month ago       Treatment:   Ther-ex:   Hooklying:  Glut  stretch in figure four using muscle energy technique for contract-relax stretch, 5 sec contract and 20 sec relax x 3 iterations each side; pt had slight back pain with forward reach to pull knee towards chest. Pt instructed to bring knee towards chest using hip flexors rather than reaching forward. Pt had no pain with stretching the second LE.    Lumbar rotation with LEs supported by ball with deep breathing and 5 sec hold at end range x6 reps each direction; pt assisted at end range to increase AROM slightly by supporting LEs on ball.  Supine  Supine straight leg ball twists alternating direction 3 x 8 reps; pt had muscle fatigue wth last reps of each set in abdominal and hip flexor muscles, but no pain.               Dead bug 3 x 20 reps; mod verbal/tactile cues and demonstration for proper technique; pt reported slight tension in low back with scissor leg position; pt instructed to support flexed LE in hooklying position to decreased back tension. Pt was able to continue exercise with no further issues.     Sidelying  Side plank 2 x 20 sec on R side, pt unable to perform on L side due to shoulder irritation; tactile cues to extend hip farther and to push up tall on shoulder for better scapular position.     Qped:    Plank 2 x 20 sec on elbows and knees; pt reported no pain, only muscle fatigue, pt only able to hold x 17 sec on second set.               Bird dog x 10 alternating with 5 sec hold; cues for LE positioning and for increased hold time. Pt has relatively good form requiring little cueing.    Sitting:      Thoraco-Lumbar rotation alternating left and right (arms across chest) with deep breathing x 6 each direction and cues to turn farther on exhale; light active assist at end range on last two reps each direction.          PT Education - 05/18/17 1624    Education provided Yes   Education Details ther-ex, postural awareness. HEP updated   Person(s) Educated Patient    Methods Explanation;Demonstration;Tactile cues;Verbal cues   Comprehension Verbalized understanding;Returned demonstration;Verbal cues required;Tactile cues required          PT Short Term Goals - 05/04/17 1944      PT SHORT TERM GOAL #1   Title Pt will perform HEP 3/7 days during first 4 weeks of therapy to maximize her improvement in functional mobility and safety   Baseline HEP new  Time 4   Period Weeks   Status New   Target Date 06/01/17     PT SHORT TERM GOAL #2   Title Pt will report worst pain in last 24 hours of 5/10 to indicate a significant improvement and tolerance of ADLs.    Baseline worst pain 9/10   Time 4   Period Weeks   Status New   Target Date 06/01/17           PT Long Term Goals - 05/04/17 1947      PT LONG TERM GOAL #1   Title Patient will report a worst pain of 3/10 on VAS in to improve tolerance with ADLs and reduced symptoms with activities.    Baseline 9/10 at worst   Time 6   Period Weeks   Status New   Target Date 06/15/17     PT LONG TERM GOAL #2   Title  Patient will demonstrate an increase in lumbar spine AROM by 10 deg in all directions to enable her to perform ADLs such as bending, reaching, and lifting without limitation.    Baseline Flexion 40 deg, lateral flexion L 10 deg, R 15 deg. Pain with forward reaching and bending (05/04/17)   Time 6   Period Weeks   Status New   Target Date 06/15/17     PT LONG TERM GOAL #3   Title Patient will reduce modified Oswestry score to <4% disabled as to demonstrate minimal disability and meaningful improvement with ADLs including improved sleeping tolerance, walking/sitting tolerance etc for better mobility with ADLs.    Baseline 16% disabled    Time 6   Period Weeks   Status New   Target Date 06/15/17     PT LONG TERM GOAL #4   Title Patient will be able to carry up to a 20# object up a flight of stairs independently with pain less than 3/10 for independence with volunteering duties  related to [employer], such as carrying a case of water up the stairs..    Baseline Pain with lifting up to 9/10   Time 6   Period Weeks   Status New   Target Date 06/15/17               Plan - 05/18/17 1651    Clinical Impression Statement Pt is showing improvement with decreased pain this week. She was instructed core stability exercises and gentile dynamic stretching for increased thoracic mobility. Pt had slight provocation of back pain with positional changes during therapy, but she had no pain by the end of session. Pt reports that her HEP is going well with no issues or increase in pain. She also reports she is no longer having anterior rib pain. Pt will continue to benefit from skilled therapy in order to maximize her functional mobility and decrease her pain.    Rehab Potential Good   Clinical Impairments Affecting Rehab Potential Pt has structural scoliosis that could contribute to back pain.   PT Frequency 2x / week   PT Duration 6 weeks   PT Treatment/Interventions Biofeedback;Cryotherapy;Moist Heat;Stair training;Therapeutic exercise;Neuromuscular re-education;Patient/family education;Manual techniques;Passive range of motion;Dry needling   PT Next Visit Plan Further evaluate core and spinal extensor strength, advance HEP, bridges, bird dog, dead bug   PT Home Exercise Plan AROM for lumbar and thoracic spine, advance to stretching and core strength   Consulted and Agree with Plan of Care Patient      Patient will benefit from skilled therapeutic  intervention in order to improve the following deficits and impairments:  Pain, Postural dysfunction, Impaired sensation, Impaired perceived functional ability, Impaired flexibility, Increased muscle spasms, Hypomobility, Decreased range of motion  Visit Diagnosis: Chronic 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 Counseling regarding end of life  decision making 10/24/2014  . Osteopenia 11/01/2013  . Arthralgia 07/05/2013  . Depression, major, in partial remission ([place]) 04/12/2013  . Chemotherapy-induced neuropathy ([place]) 04/04/2013  . Malignant neoplasm of lower-inner quadrant of female breast ([place]) 03/14/2013  . Lymphedema left arm 02/17/2013  . Breast cancer, left ([place]) 12/16/2012   Cynthia Griffin M Kayelyn Lemon, SPT This entire session was performed under direct supervision and direction of a licensed therapist/therapist assistant . I have personally read, edited and approve of the note as written.  Trotter,Margaret PT, DPT 05/18/2017, 5:18 PM  [place] MAIN [facility] SERVICES [address] [place], [place], [address] Phone: [phone]   Fax:  [phone]  Name: Cynthia Griffin MRN: [id number] Date of Birth: [birth year]

## 2017-05-20 ENCOUNTER — Ambulatory Visit: Payer: Medicare Other | Admitting: Physical Therapy

## 2017-05-20 ENCOUNTER — Encounter: Payer: Self-pay | Admitting: Physical Therapy

## 2017-05-20 DIAGNOSIS — M545 Low back pain, unspecified: Secondary | ICD-10-CM

## 2017-05-20 DIAGNOSIS — G8929 Other chronic pain: Secondary | ICD-10-CM

## 2017-05-20 NOTE — Therapy (Signed)
[place] MAIN [facility] SERVICES [address] [place], [place], [address] Phone: [phone]   Fax:  [phone]  Physical Therapy Treatment  Patient Details  Name: Cynthia Griffin MRN: [id number] Date of Birth: 1946/07/15 Referring Provider: Barry Dienes MD; Eliezer Lofts MD (PCP)  Encounter Date: 05/20/2017      PT End of Session - 05/20/17 1611    Visit Number 6   Number of Visits 13   Date for PT Re-Evaluation 06/15/17   Authorization Type G - code 6   Authorization Time Period 10   PT Start Time 1602   PT Stop Time 5956   PT Time Calculation (min) 42 min   Activity Tolerance Patient tolerated treatment well;No increased pain   Behavior During Therapy WFL for tasks assessed/performed      Past Medical History:  Diagnosis Date  . Anemia    hx  . Anxiety   . Arthritis   . Breast cancer ([place]) 11/14/12   left, ER +, PR -, HER 2 -  . Carpal tunnel syndrome of right wrist   . Family history of anesthesia complication    pneumothorax - post op 1980's  . Hx of radiation therapy 05/09/13-06/22/13   left breast 60.4Gy  . Neuropathy    tips of fingers and left foot due to chemo  . Pneumonia   . Pneumothorax on right March, 2014   while having port-a-cath placed    Past Surgical History:  Procedure Laterality Date  . ABDOMINAL HYSTERECTOMY  1992   fibroids, total  . APPENDECTOMY    . AXILLARY LYMPH NODE DISSECTION Left 12/01/2012   Procedure: AXILLARY LYMPH NODE DISSECTION;  Surgeon: Stark Klein, MD;  Location: [place];  Service: General;  Laterality: Left;  . EYE SURGERY Bilateral 2006   lasik surgery  . LATISSIMUS FLAP TO BREAST Left 03/29/2014   dr Harlow Mares  . LATISSIMUS FLAP TO BREAST Left 03/29/2014   Procedure: LEFT LATISSIMUS FLAP;  Surgeon: Crissie Reese, MD;  Location: [place];  Service: Plastics;  Laterality: Left;  Marland Kitchen MASTECTOMY Left December 20, 2012  . MASTECTOMY W/ SENTINEL NODE BIOPSY Left 11/14/2012    Procedure: MASTECTOMY WITH SENTINEL LYMPH NODE BIOPSY;  Surgeon: Stark Klein, MD;  Location: [place];  Service: General;  Laterality: Left;  . MENISCUS REPAIR     Right Knee  . PORT-A-CATH REMOVAL N/A 05/02/2013   Procedure: REMOVAL PORT-A-CATH;  Surgeon: Stark Klein, MD;  Location: [place];  Service: General;  Laterality: N/A;  . PORTACATH PLACEMENT Right 12/01/2012   Procedure: INSERTION PORT-A-CATH;  Surgeon: Stark Klein, MD;  Location: [place];  Service: General;  Laterality: Right;  . SHOULDER ARTHROSCOPY Right 1/15   rotator cuff repair  . TONSILLECTOMY      There were no vitals filed for this visit.      Subjective Assessment - 05/20/17 1605    Subjective Pt reports she is doing well with no back pain currently; she reports no change in the last few days.   Pertinent History 71 y/o female with LBP starting in February 2018 that has been getting worse over that past few months; She reports pain is isolated to her left lower back that does not radiate into LEs. pt reports she tried a bootcamp exercise program about a month ago that she quit due to worsening back pain. Pt has no recent history of back pain. Medical Hx significant for breast CA in 2014 with mastectomy and chemo.  Pt had failed latissimus dorsi flap reconstruction surgery in 2015 with no related issues currently other than some left lower thoracic numbness. Pt had recent follow-up with MD and is currently cancer-free.    Limitations Standing   How long can you sit comfortably? Unlimited    How long can you stand comfortably? 2 hours   How long can you walk comfortably? Unlimited    Diagnostic tests 04/2017 CT abdomen and pelvis negative.   Patient Stated Goals Decrease pain, possibly return to exercise    Currently in Pain? No/denies   Pain Onset More than a month ago       Treatment:   Ther-ex:    Standing circuit X 2:   SLS for core activation with no UE assist in parallel bars, 3 x 30 sec each  side; pt reported no pain; cues to keep hips level and keep core firm. Cues to keep lEs from touching each other in SLS  Reverse wood chop in tower x 10 reps each direction in tower with 7.5#; cues to avoid lumbar flexion and to keep cable close to body; reduced to 5# for second set to prevent compensation   Wall sits for light core activation 3 x 30 sec with cues for transverse abdominus activation with continued breathing; cues for foot position to minimize stress on knees; pt reported no pain.  Hooklying:  Re-instructed piriformis stretch in figure four 2 x 30 sec, pt reports that she feels tight, but no pain.  Pelvic tilts with cues for TA activation and to hold each eand range x 2-3 breaths; tactile cues for isolation of movement x 5 each direction   Abdominal crunch with cues not to interlock hands behind head, and only to place hand near head and to activate core to the point that the upper shoulder blades come off the table    (HEP updated with wall sits and figure four stretch with handout, see pt instructions)         PT Education - 05/20/17 1611    Education provided Yes   Education Details Ther-ex, HEP updated   Person(s) Educated Patient   Methods Explanation;Demonstration;Tactile cues;Verbal cues   Comprehension Verbal cues required;Tactile cues required;Returned demonstration;Verbalized understanding          PT Short Term Goals - 05/04/17 1944      PT SHORT TERM GOAL #1   Title Pt will perform HEP 3/7 days during first 4 weeks of therapy to maximize her improvement in functional mobility and safety   Baseline HEP new   Time 4   Period Weeks   Status New   Target Date 06/01/17     PT SHORT TERM GOAL #2   Title Pt will report worst pain in last 24 hours of 5/10 to indicate a significant improvement and tolerance of ADLs.    Baseline worst pain 9/10   Time 4   Period Weeks   Status New   Target Date 06/01/17           PT Long Term Goals - 05/04/17  1946      PT LONG TERM GOAL #1   Title Patient will report a worst pain of 3/10 on VAS in to improve tolerance with ADLs and reduced symptoms with activities.    Baseline 9/10 at worst   Time 6   Period Weeks   Status New   Target Date 06/15/17     PT LONG TERM GOAL #2   Title  Patient  will demonstrate an increase in lumbar spine AROM by 10 deg in all directions to enable her to perform ADLs such as bending, reaching, and lifting without limitation.    Baseline Flexion 40 deg, lateral flexion L 10 deg, R 15 deg. Pain with forward reaching and bending (05/04/17)   Time 6   Period Weeks   Status New   Target Date 06/15/17     PT LONG TERM GOAL #3   Title Patient will reduce modified Oswestry score to <4% disabled as to demonstrate minimal disability and meaningful improvement with ADLs including improved sleeping tolerance, walking/sitting tolerance etc for better mobility with ADLs.    Baseline 16% disabled    Time 6   Period Weeks   Status New   Target Date 06/15/17     PT LONG TERM GOAL #4   Title Patient will be able to carry up to a 20# object up a flight of stairs independently with pain less than 3/10 for independence with volunteering duties related to [employer], such as carrying a case of water up the stairs..    Baseline Pain with lifting up to 9/10   Time 6   Period Weeks   Status New   Target Date 06/15/17               Plan - 05/20/17 1624    Clinical Impression Statement Pt instructed in core stability exercises with no increase in pain. Pt reports she has not been as adherent to her HEP this week due to having a family member hospitalized. Her back pain is unchanged since last session, though overall it is much better than in past weeks. Pt has advanced to high level core stability training without adverse reaction. Pt will continue to benefit from skilled therapy to maximize her functional mobility and decrease her pain   Rehab Potential Good    Clinical Impairments Affecting Rehab Potential Pt has structural scoliosis that could contribute to back pain.   PT Frequency 2x / week   PT Duration 6 weeks   PT Treatment/Interventions Biofeedback;Cryotherapy;Moist Heat;Stair training;Therapeutic exercise;Neuromuscular re-education;Patient/family education;Manual techniques;Passive range of motion;Dry needling   PT Next Visit Plan Further evaluate core and spinal extensor strength, advance HEP, bridges, bird dog, dead bug   PT Home Exercise Plan AROM for lumbar and thoracic spine, advance to stretching and core strength   Consulted and Agree with Plan of Care Patient      Patient will benefit from skilled therapeutic intervention in order to improve the following deficits and impairments:  Pain, Postural dysfunction, Impaired sensation, Impaired perceived functional ability, Impaired flexibility, Increased muscle spasms, Hypomobility, Decreased range of motion  Visit Diagnosis: Chronic 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 Counseling regarding end of life decision making 10/24/2014  . Osteopenia 11/01/2013  . Arthralgia 07/05/2013  . Depression, major, in partial remission ([place]) 04/12/2013  . Chemotherapy-induced neuropathy ([place]) 04/04/2013  . Malignant neoplasm of lower-inner quadrant of female breast ([place]) 03/14/2013  . Lymphedema left arm 02/17/2013  . Breast cancer, left ([place]) 12/16/2012   Rodgers Likes M Deionte Spivack, SPT This entire session was performed under direct supervision and direction of a licensed therapist/therapist assistant . I have personally read, edited and approve of the note as written.  Trotter,Margaret  PT, DPT 05/20/2017, 4:55 PM  [place] MAIN [facility] SERVICES [address] [place], [place], [address] Phone:  [phone]   Fax:  [phone]  Name: Cynthia Griffin MRN: [id number] Date of Birth:  03/10/1946

## 2017-05-20 NOTE — Patient Instructions (Addendum)
Outer Hip Stretch: Figure Four (Wall)    Lying on back with knees bent, cross one leg over the other (ankle over knee) then bring the bent knee towards your opposite shoulder until a stretch is felt in posterior hip  Hold for _10___ breaths. Repeat __2__ times each leg.  Copyright  VHI. All rights reserved.  Wall Sit    Back against wall, slide down so knees are at 90 angle. Hold __30__ seconds. Do __2__ sets. Complete __1__ repetitions.  http://st.exer.us/295   Copyright  VHI. All rights reserved.

## 2017-05-25 ENCOUNTER — Encounter: Payer: Self-pay | Admitting: Physical Therapy

## 2017-05-25 ENCOUNTER — Ambulatory Visit: Payer: Medicare Other | Admitting: Physical Therapy

## 2017-05-25 DIAGNOSIS — M545 Low back pain, unspecified: Secondary | ICD-10-CM

## 2017-05-25 DIAGNOSIS — G8929 Other chronic pain: Secondary | ICD-10-CM

## 2017-05-25 NOTE — Therapy (Signed)
[place] MAIN [facility] SERVICES [address] [place], [place], [address] Phone: [phone]   Fax:  [phone]  Physical Therapy Treatment  Patient Details  Name: Cynthia Griffin MRN: [id number] Date of Birth: 01-24-1946 Referring Provider: Barry Dienes MD; Eliezer Lofts MD (PCP)  Encounter Date: 05/25/2017      PT End of Session - 05/25/17 1144    Visit Number 7   Number of Visits 13   Date for PT Re-Evaluation 06/15/17   Authorization Type G - code 7   Authorization Time Period 10   PT Start Time 1117   PT Stop Time 1159   PT Time Calculation (min) 42 min   Activity Tolerance Patient tolerated treatment well;No increased pain   Behavior During Therapy WFL for tasks assessed/performed      Past Medical History:  Diagnosis Date  . Anemia    hx  . Anxiety   . Arthritis   . Breast cancer ([place]) 11/14/12   left, ER +, PR -, HER 2 -  . Carpal tunnel syndrome of right wrist   . Family history of anesthesia complication    pneumothorax - post op 1980's  . Hx of radiation therapy 05/09/13-06/22/13   left breast 60.4Gy  . Neuropathy    tips of fingers and left foot due to chemo  . Pneumonia   . Pneumothorax on right March, 2014   while having port-a-cath placed    Past Surgical History:  Procedure Laterality Date  . ABDOMINAL HYSTERECTOMY  1992   fibroids, total  . APPENDECTOMY    . AXILLARY LYMPH NODE DISSECTION Left 12/01/2012   Procedure: AXILLARY LYMPH NODE DISSECTION;  Surgeon: Stark Klein, MD;  Location: [place];  Service: General;  Laterality: Left;  . EYE SURGERY Bilateral 2006   lasik surgery  . LATISSIMUS FLAP TO BREAST Left 03/29/2014   dr Harlow Mares  . LATISSIMUS FLAP TO BREAST Left 03/29/2014   Procedure: LEFT LATISSIMUS FLAP;  Surgeon: Crissie Reese, MD;  Location: [place];  Service: Plastics;  Laterality: Left;  Marland Kitchen MASTECTOMY Left December 20, 2012  . MASTECTOMY W/ SENTINEL NODE BIOPSY Left 11/14/2012    Procedure: MASTECTOMY WITH SENTINEL LYMPH NODE BIOPSY;  Surgeon: Stark Klein, MD;  Location: [place];  Service: General;  Laterality: Left;  . MENISCUS REPAIR     Right Knee  . PORT-A-CATH REMOVAL N/A 05/02/2013   Procedure: REMOVAL PORT-A-CATH;  Surgeon: Stark Klein, MD;  Location: [place];  Service: General;  Laterality: N/A;  . PORTACATH PLACEMENT Right 12/01/2012   Procedure: INSERTION PORT-A-CATH;  Surgeon: Stark Klein, MD;  Location: [place];  Service: General;  Laterality: Right;  . SHOULDER ARTHROSCOPY Right 1/15   rotator cuff repair  . TONSILLECTOMY      There were no vitals filed for this visit.      Subjective Assessment - 05/25/17 1147    Subjective Pt reports she is doing well; she reports less pain with previously provocative movements. Pt has no pain at this time.    Pertinent History 71 y/o female with LBP starting in February 2018 that has been getting worse over that past few months; She reports pain is isolated to her left lower back that does not radiate into LEs. pt reports she tried a bootcamp exercise program about a month ago that she quit due to worsening back pain. Pt has no recent history of back pain. Medical Hx significant for breast CA in 2014 with mastectomy  and chemo. Pt had failed latissimus dorsi flap reconstruction surgery in 2015 with no related issues currently other than some left lower thoracic numbness. Pt had recent follow-up with MD and is currently cancer-free.    Limitations Standing   How long can you sit comfortably? Unlimited    How long can you stand comfortably? 2 hours   How long can you walk comfortably? Unlimited    Diagnostic tests 04/2017 CT abdomen and pelvis negative.   Patient Stated Goals Decrease pain, possibly return to exercise    Currently in Pain? No/denies   Pain Onset More than a month ago                Treatment:   Ther-ex:   Supine:  Piriformis stretch 2 x 30 sec; cues for hand position  to assist stretch, cues to flex hip up to starting position rather than flexing back to avoid provocative position; pt reports no pain   Glut med stretch 2 x 30 sec cues to bring knee towards opposite shoulder; pt reports she fells a good stretch in gluts early in ROM; cues to perform this stretch as part of HEP  Pt had decreased hip and glut tightness following stretch.     Standing Circuit x 2 for following 3 exercises:              SLS 2 x 30 sec each LE with trunk rotation holding 5kg ball for core activation with no UE assist in parallel bars; pt able to maintain stance 5-10 sec with intermittent contralateral toe tap to regain balance. Cues to increase trunk rotation AROM and to draw stomach in towards spine for TA activation; pt reports no knee or back pain with exercise.   Reverse wood chop in tower each direction in tower 2 x 12 with 7.5#; cues to isolate motion and to avoid excessive wrist extension   Wall sits 2 x 30 sec with 2kg ball lift for light core activation with cues for transverse abdominus activation with continued breathing; cues to move ball in and out; pt reports she feels good core activation. Pt reports mild R knee discomfort with exercise. Exercise discontinued for second set to prevent worsening R knee pain.     Hooklying circuit x 2:    Abdominal crunch 2 x 15 reps with cues not to interlock hands behind head, and only to place hand near head and to raise upper body only to the point that the upper shoulder blades come off the table; pt able to perform with proper technique   Windshield wiper (lumbothoracic lateral flexion in hooklying) alternating to left and right; cues to keep shoulder blades off of mat table. Pt required 2 brief rest breaks to get to 10 reps each side due to fatigue. Added to HEP.  Pelvic tilts 2 x 10 forward and backwards each with cues to draw in belly for TA activation and to use hands on hips for increased awareness of pelvic position   Pt  reported "very slight" LBP following crunch exercise. Pt instructed not to perform crunch at home at this time.           PT Education - 05/25/17 1143    Education provided Yes   Education Details Ther-ex, HEP modified, stretching   Person(s) Educated Patient   Methods Explanation;Demonstration;Tactile cues;Verbal cues   Comprehension Verbal cues required;Returned demonstration;Verbalized understanding;Tactile cues required          PT Short Term Goals - 05/04/17 1944  PT SHORT TERM GOAL #1   Title Pt will perform HEP 3/7 days during first 4 weeks of therapy to maximize her improvement in functional mobility and safety   Baseline HEP new   Time 4   Period Weeks   Status New   Target Date 06/01/17     PT SHORT TERM GOAL #2   Title Pt will report worst pain in last 24 hours of 5/10 to indicate a significant improvement and tolerance of ADLs.    Baseline worst pain 9/10   Time 4   Period Weeks   Status New   Target Date 06/01/17           PT Long Term Goals - 05/04/17 1947      PT LONG TERM GOAL #1   Title Patient will report a worst pain of 3/10 on VAS in to improve tolerance with ADLs and reduced symptoms with activities.    Baseline 9/10 at worst   Time 6   Period Weeks   Status New   Target Date 06/15/17     PT LONG TERM GOAL #2   Title  Patient will demonstrate an increase in lumbar spine AROM by 10 deg in all directions to enable her to perform ADLs such as bending, reaching, and lifting without limitation.    Baseline Flexion 40 deg, lateral flexion L 10 deg, R 15 deg. Pain with forward reaching and bending (05/04/17)   Time 6   Period Weeks   Status New   Target Date 06/15/17     PT LONG TERM GOAL #3   Title Patient will reduce modified Oswestry score to <4% disabled as to demonstrate minimal disability and meaningful improvement with ADLs including improved sleeping tolerance, walking/sitting tolerance etc for better mobility with ADLs.     Baseline 16% disabled    Time 6   Period Weeks   Status New   Target Date 06/15/17     PT LONG TERM GOAL #4   Title Patient will be able to carry up to a 20# object up a flight of stairs independently with pain less than 3/10 for independence with volunteering duties related to [employer], such as carrying a case of water up the stairs..    Baseline Pain with lifting up to 9/10   Time 6   Period Weeks   Status New   Target Date 06/15/17               Plan - 05/25/17 1200    Clinical Impression Statement Pt continues to report that her LBP is getting better with less pain during previously provocative movements, such as forward bending. Pt instructed in core stability exercise circuits in standing and supine with no pain provocation, except for slight pain with abdominal crunch. Pt is adherent to HEP and is progressing well. Pt will continue to benefit from continued therapy to maximize functional mobility and decrease pain.   Rehab Potential Good   Clinical Impairments Affecting Rehab Potential Pt has structural scoliosis that could contribute to back pain.   PT Frequency 2x / week   PT Duration 6 weeks   PT Treatment/Interventions Biofeedback;Cryotherapy;Moist Heat;Stair training;Therapeutic exercise;Neuromuscular re-education;Patient/family education;Manual techniques;Passive range of motion;Dry needling   PT Next Visit Plan Further evaluate core and spinal extensor strength, advance HEP, bridges, bird dog, dead bug   PT Home Exercise Plan AROM for lumbar and thoracic spine, advance to stretching and core strength   Consulted and Agree with Plan of Care  Patient      Patient will benefit from skilled therapeutic intervention in order to improve the following deficits and impairments:  Pain, Postural dysfunction, Impaired sensation, Impaired perceived functional ability, Impaired flexibility, Increased muscle spasms, Hypomobility, Decreased range of motion  Visit  Diagnosis: Chronic 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 Counseling regarding end of life decision making 10/24/2014  . Osteopenia 11/01/2013  . Arthralgia 07/05/2013  . Depression, major, in partial remission ([place]) 04/12/2013  . Chemotherapy-induced neuropathy ([place]) 04/04/2013  . Malignant neoplasm of lower-inner quadrant of female breast ([place]) 03/14/2013  . Lymphedema left arm 02/17/2013  . Breast cancer, left ([place]) 12/16/2012   Bless Lisenby M Allisha Harter, SPT This entire session was performed under direct supervision and direction of a licensed therapist/therapist assistant . I have personally read, edited and approve of the note as written.  Trotter,Margaret PT, DPT 05/25/2017, 1:15 PM  [place] MAIN [facility] SERVICES [address] [place], [place], [address] Phone: [phone]   Fax:  [phone]  Name: Cynthia Griffin MRN: [id number] Date of Birth: [birth year]

## 2017-05-27 ENCOUNTER — Encounter: Payer: Medicare Other | Admitting: Physical Therapy

## 2017-05-31 ENCOUNTER — Telehealth: Payer: Self-pay

## 2017-05-31 NOTE — Telephone Encounter (Signed)
Pt called to request a new oncology referral in [place]. Pt relocating in the next coming weeks and would like to continue her care with an oncologist in [place]. Pt currently taking tamoxifen and is tolerating well. She would like Dr.Magrinat's opinion on who she needs to see. Told pt that Dr.Magrinat is out of town for the next 3 weeks. Pt may call back in 3 weeks to follow up. Pt agreeable to plan and will call back some time in October. No further concerns at this time.

## 2017-06-01 ENCOUNTER — Ambulatory Visit: Payer: Medicare Other | Admitting: Physical Therapy

## 2017-06-03 ENCOUNTER — Ambulatory Visit: Payer: Medicare Other | Admitting: Physical Therapy

## 2017-06-08 ENCOUNTER — Ambulatory Visit: Payer: Medicare Other | Admitting: Physical Therapy

## 2017-06-10 ENCOUNTER — Ambulatory Visit: Payer: Medicare Other | Admitting: Physical Therapy

## 2017-06-15 ENCOUNTER — Ambulatory Visit: Payer: Medicare Other | Admitting: Physical Therapy

## 2017-06-16 ENCOUNTER — Encounter: Payer: Self-pay | Admitting: Internal Medicine

## 2017-06-17 ENCOUNTER — Ambulatory Visit: Payer: Medicare Other | Admitting: Physical Therapy

## 2017-07-03 ENCOUNTER — Other Ambulatory Visit: Payer: Self-pay | Admitting: Internal Medicine

## 2017-07-03 DIAGNOSIS — H8190 Unspecified disorder of vestibular function, unspecified ear: Secondary | ICD-10-CM

## 2017-07-03 DIAGNOSIS — I10 Essential (primary) hypertension: Secondary | ICD-10-CM

## 2017-07-08 ENCOUNTER — Telehealth: Payer: Self-pay | Admitting: *Deleted

## 2017-07-08 NOTE — Telephone Encounter (Signed)
Glady and done ! Thanks Mateo Flow

## 2017-07-08 NOTE — Telephone Encounter (Signed)
This RN returned call to [facility] in [place] requesting refill on tamoxifen due to pt's relocation to this area at phone number [phone]  Noted per chart review - pt is not an active patient at this time with last office visit noted in 2015 with completion of care and release from practice.  Tamoxifen is not listed on the pt's med list per EPIC.  Pt does have more recent documentation of care under Dr Beryle Beams at [facility].  Above information including contact information for Dr Beryle Beams.

## 2017-07-08 NOTE — Telephone Encounter (Signed)
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 Out 11 years. Mammograms neg 12/2016.  Does not need to continue tamoxifen. Was not on our med list. Would you mind calling them back with this info please.   Thanks DrG :)

## 2017-07-09 NOTE — Telephone Encounter (Signed)
lisinopril (PRINIVIL,ZESTRIL) 40 MG tablet  meclizine (ANTIVERT) 12.5 MG tablet  metFORMIN (GLUCOPHAGE) 1000 MG tablet, refill request @ [facility] on [address].

## 2017-07-09 NOTE — Telephone Encounter (Signed)
Spoke to daughter kaylina cahue. Patient did not relocate & still with [facility]. Thanks!

## 2017-07-09 NOTE — Telephone Encounter (Signed)
Please check - I just got a note yesterday that this patient has moved to another state DrG

## 2017-07-09 NOTE — Telephone Encounter (Signed)
LM to call us back- need to verify refill req & if pt has moved out of state.

## 2017-07-09 NOTE — Telephone Encounter (Signed)
Thanks! Are you keeping Gus in line?

## 2017-07-12 MED ORDER — METFORMIN HCL 1000 MG PO TABS: 1000.0000 mg | ORAL_TABLET | Freq: Two times a day (BID) | ORAL | 3 refills | Status: DC

## 2017-07-12 MED ORDER — LISINOPRIL 40 MG PO TABS: 40.0000 mg | ORAL_TABLET | Freq: Every day | ORAL | 3 refills | Status: DC

## 2017-07-13 ENCOUNTER — Other Ambulatory Visit: Payer: Self-pay | Admitting: Nurse Practitioner

## 2017-07-14 ENCOUNTER — Other Ambulatory Visit: Payer: Self-pay | Admitting: *Deleted

## 2017-07-14 DIAGNOSIS — I10 Essential (primary) hypertension: Secondary | ICD-10-CM

## 2017-07-14 MED ORDER — LISINOPRIL 40 MG PO TABS: 40.0000 mg | ORAL_TABLET | Freq: Every day | ORAL | 0 refills | Status: DC

## 2017-07-15 ENCOUNTER — Other Ambulatory Visit: Payer: Medicare Other

## 2017-07-22 ENCOUNTER — Ambulatory Visit: Payer: Medicare Other | Admitting: Oncology

## 2017-08-05 DIAGNOSIS — F209 Schizophrenia, unspecified: Secondary | ICD-10-CM | POA: Diagnosis not present

## 2017-08-06 ENCOUNTER — Other Ambulatory Visit: Payer: Self-pay | Admitting: Oncology

## 2017-08-09 ENCOUNTER — Other Ambulatory Visit: Payer: Self-pay | Admitting: Internal Medicine

## 2017-08-09 DIAGNOSIS — I10 Essential (primary) hypertension: Secondary | ICD-10-CM

## 2017-08-11 ENCOUNTER — Ambulatory Visit: Payer: Self-pay

## 2017-08-11 NOTE — Telephone Encounter (Signed)
Next appt scheduled  12/11 in [facility].

## 2017-08-17 ENCOUNTER — Ambulatory Visit: Payer: Self-pay

## 2017-08-18 ENCOUNTER — Other Ambulatory Visit: Payer: Self-pay | Admitting: Internal Medicine

## 2017-08-19 ENCOUNTER — Telehealth: Payer: Self-pay | Admitting: Internal Medicine

## 2017-08-19 MED ORDER — TRAMADOL HCL 50 MG PO TABS: 50.0000 mg | ORAL_TABLET | Freq: Three times a day (TID) | ORAL | 0 refills | Status: DC | PRN

## 2017-08-19 NOTE — Telephone Encounter (Signed)
Tramadol called in to [facility].

## 2017-08-19 NOTE — Telephone Encounter (Signed)
Refill accidentally printed without being sent to pharmacy. Resent refill to pharmacy.

## 2017-09-21 ENCOUNTER — Telehealth: Payer: Self-pay | Admitting: Oncology

## 2017-09-21 NOTE — Telephone Encounter (Signed)
MAILED PT RECORD S TO [facility]

## 2017-09-23 ENCOUNTER — Other Ambulatory Visit: Payer: Self-pay | Admitting: Internal Medicine

## 2017-09-23 DIAGNOSIS — I25110 Atherosclerotic heart disease of native coronary artery with unstable angina pectoris: Secondary | ICD-10-CM

## 2017-09-23 NOTE — Telephone Encounter (Signed)
Patient daughter request refill Pravachol and tramadol.

## 2017-09-24 MED ORDER — PRAVASTATIN SODIUM 40 MG PO TABS: 40.0000 mg | ORAL_TABLET | Freq: Every day | ORAL | 3 refills | Status: DC

## 2017-09-24 NOTE — Telephone Encounter (Signed)
Refilled Statin. Would like reassessment of patient in clinic (hasn't been seen since July) before refilling Tramadol.

## 2017-09-30 ENCOUNTER — Ambulatory Visit: Payer: Self-pay

## 2017-10-04 ENCOUNTER — Other Ambulatory Visit: Payer: Self-pay | Admitting: Internal Medicine

## 2017-10-04 MED ORDER — TRAMADOL HCL 50 MG PO TABS: 50.0000 mg | ORAL_TABLET | Freq: Three times a day (TID) | ORAL | 0 refills | Status: DC | PRN

## 2017-10-04 NOTE — Telephone Encounter (Signed)
Next appt scheduled  10/11/17 with PCP. 

## 2017-10-04 NOTE — Telephone Encounter (Signed)
Refill Request ° ° °traMADol (ULTRAM) 50 MG tablet °

## 2017-10-05 NOTE — Telephone Encounter (Signed)
Tramadol rx called to [facility]. 

## 2017-10-11 ENCOUNTER — Encounter: Payer: Self-pay | Admitting: Internal Medicine

## 2017-10-15 ENCOUNTER — Encounter: Payer: Self-pay | Admitting: Internal Medicine

## 2017-10-28 DIAGNOSIS — F209 Schizophrenia, unspecified: Secondary | ICD-10-CM | POA: Diagnosis not present

## 2017-11-17 ENCOUNTER — Other Ambulatory Visit: Payer: Self-pay | Admitting: Internal Medicine

## 2017-11-17 DIAGNOSIS — H8190 Unspecified disorder of vestibular function, unspecified ear: Secondary | ICD-10-CM

## 2017-11-18 ENCOUNTER — Other Ambulatory Visit: Payer: Self-pay | Admitting: *Deleted

## 2017-11-18 NOTE — Telephone Encounter (Signed)
Next appt scheduled 4/29 with PCP. 

## 2017-11-23 MED ORDER — TRAMADOL HCL 50 MG PO TABS: 50.0000 mg | ORAL_TABLET | Freq: Three times a day (TID) | ORAL | 0 refills | Status: DC | PRN

## 2017-11-23 NOTE — Telephone Encounter (Signed)
Tramadol called in to [facility]) pharmacy.

## 2018-01-03 ENCOUNTER — Ambulatory Visit (INDEPENDENT_AMBULATORY_CARE_PROVIDER_SITE_OTHER): Payer: Medicare Other | Admitting: Internal Medicine

## 2018-01-03 ENCOUNTER — Other Ambulatory Visit: Payer: Self-pay

## 2018-01-03 ENCOUNTER — Encounter: Payer: Self-pay | Admitting: Internal Medicine

## 2018-01-03 VITALS — BP 148/64 | HR 76 | Temp 97.9°F | Ht 70.0 in | Wt 213.5 lb

## 2018-01-03 DIAGNOSIS — E876 Hypokalemia: Secondary | ICD-10-CM

## 2018-01-03 DIAGNOSIS — Z79899 Other long term (current) drug therapy: Secondary | ICD-10-CM | POA: Diagnosis not present

## 2018-01-03 DIAGNOSIS — IMO0001 Reserved for inherently not codable concepts without codable children: Secondary | ICD-10-CM

## 2018-01-03 DIAGNOSIS — F209 Schizophrenia, unspecified: Secondary | ICD-10-CM | POA: Diagnosis not present

## 2018-01-03 DIAGNOSIS — Z87891 Personal history of nicotine dependence: Secondary | ICD-10-CM

## 2018-01-03 DIAGNOSIS — Z7982 Long term (current) use of aspirin: Secondary | ICD-10-CM | POA: Diagnosis not present

## 2018-01-03 DIAGNOSIS — Z79891 Long term (current) use of opiate analgesic: Secondary | ICD-10-CM | POA: Diagnosis not present

## 2018-01-03 DIAGNOSIS — K219 Gastro-esophageal reflux disease without esophagitis: Secondary | ICD-10-CM | POA: Diagnosis not present

## 2018-01-03 DIAGNOSIS — E1165 Type 2 diabetes mellitus with hyperglycemia: Secondary | ICD-10-CM

## 2018-01-03 DIAGNOSIS — M2550 Pain in unspecified joint: Secondary | ICD-10-CM | POA: Diagnosis not present

## 2018-01-03 DIAGNOSIS — I10 Essential (primary) hypertension: Secondary | ICD-10-CM | POA: Diagnosis not present

## 2018-01-03 DIAGNOSIS — E119 Type 2 diabetes mellitus without complications: Secondary | ICD-10-CM

## 2018-01-03 DIAGNOSIS — E785 Hyperlipidemia, unspecified: Secondary | ICD-10-CM

## 2018-01-03 DIAGNOSIS — G8929 Other chronic pain: Secondary | ICD-10-CM

## 2018-01-03 DIAGNOSIS — Z794 Long term (current) use of insulin: Secondary | ICD-10-CM

## 2018-01-03 DIAGNOSIS — R0789 Other chest pain: Secondary | ICD-10-CM | POA: Diagnosis not present

## 2018-01-03 DIAGNOSIS — R42 Dizziness and giddiness: Secondary | ICD-10-CM

## 2018-01-03 LAB — POCT GLYCOSYLATED HEMOGLOBIN (HGB A1C): Hemoglobin A1C: 9.9

## 2018-01-03 LAB — GLUCOSE, CAPILLARY: Glucose-Capillary: 129 mg/dL — ABNORMAL HIGH (ref 65–99)

## 2018-01-03 MED ORDER — SITAGLIPTIN PHOSPHATE 100 MG PO TABS: 100.0000 mg | ORAL_TABLET | Freq: Every day | ORAL | 1 refills | Status: DC

## 2018-01-03 MED ORDER — RANITIDINE HCL 150 MG PO TABS: 150.0000 mg | ORAL_TABLET | Freq: Two times a day (BID) | ORAL | 3 refills | Status: DC

## 2018-01-03 MED ORDER — PRAVASTATIN SODIUM 40 MG PO TABS: 40.0000 mg | ORAL_TABLET | Freq: Every day | ORAL | 3 refills | Status: DC

## 2018-01-03 MED ORDER — TRAMADOL HCL 50 MG PO TABS: 50.0000 mg | ORAL_TABLET | Freq: Three times a day (TID) | ORAL | 0 refills | Status: DC | PRN

## 2018-01-03 MED ORDER — INSULIN DETEMIR 100 UNIT/ML FLEXPEN: 35.0000 [IU] | PEN_INJECTOR | Freq: Every day | SUBCUTANEOUS | 11 refills | Status: DC

## 2018-01-03 MED ORDER — LISINOPRIL 40 MG PO TABS: 40.0000 mg | ORAL_TABLET | Freq: Every day | ORAL | 3 refills | Status: DC

## 2018-01-03 MED ORDER — MECLIZINE HCL 12.5 MG PO TABS: ORAL_TABLET | ORAL | 0 refills | Status: DC

## 2018-01-03 MED ORDER — HYDROCHLOROTHIAZIDE 25 MG PO TABS: 25.0000 mg | ORAL_TABLET | Freq: Every day | ORAL | 3 refills | Status: DC

## 2018-01-03 MED ORDER — TRAMADOL HCL 50 MG PO TABS: 50.0000 mg | ORAL_TABLET | Freq: Three times a day (TID) | ORAL | 3 refills | Status: DC | PRN

## 2018-01-03 NOTE — Progress Notes (Signed)
CC: T2DM, HTN follow up  HPI:  [name] is a 72 y.o. with PMH of T2DM, HLD, HTN, and schizophrenia who is presenting for follow up of her chronic medical conditions. Patient was last seen in clinic on 03/17/2017 at which time she was experiencing vertigo. Her BP was elevated above goal of >140/90, so HCTZ was added to her regimen of lisinopril to improve BP control. Patient was also started on K+ supplementation for hypokalemia noticed on lab work prior to initiation of HCTZ.   Since her last visit the patient has felt well and has no acute complaints. Patient endorses chronic joint pain and chronic chest wall pain, which is unchanged from previous visits and well controlled on Tramadol 50 mg up to 2 times daily (patient states that she usually takes 2 of these pills a day and a third one only if needed).   Patient has noticed improvement in her symptoms of dizziness and states that this symptom gets better whenever she takes the meclizine prescribed to her in July. She has only needed this medication 1 -2 times per week since she was initially evaluated for her dizziness. She denies any falls, headaches, and focal weakness.  Patient states that she has recently run out of all of her medications, including HCTZ, and has not taken them since the end of last week - with the exception of metformin and lisinopril. Patient self administers her medications at home, including her insulin. Patient enjoys the fact that she is independent enough to given herself her own medications and feels confident that she can stay on top off all her pills.  Patient endorses recent weight gain and thinks that this is related to recent dietary indiscretion, stating that she recently increased her intake of sugary drinks and sweets (eating up to a bag of hershey kisses per day).   Past Medical History  Past Medical History:  Diagnosis Date  . Atypical nevus   . Breast cancer ([place]) 07/14/06   Left breast. Ductal  carcinoma, grade 3. Followed by Dr. Truddie Coco. s/p lumpectomy with radiation  . Dental caries   . Diabetes mellitus   . DVT (deep vein thrombosis) in pregnancy ([place]) 1982   Affected the RLE. Required coumadin.  . Fibroids   . GERD (gastroesophageal reflux disease)   . Goiter    U/S 08/2009-persistent multiple small nodules, one in the right lower pole slightly enlarged, recommend continued followup.  Marland Kitchen History of mitral valve prolapse   . Hyperlipidemia   . Hypertension   . Ovarian cyst   . Schizophrenia ([place])    Review of Systems:   Patient denies chest pain, shortness of breath, abdominal pain, diaphoresis, nausea/vomiting, lower extremity swelling, and change in bowel/bladder habits.  Physical Exam:  Vitals:   01/03/18 1321  BP: (!) 148/64  Pulse: 76  Temp: 97.9 F (36.6 C)  TempSrc: Oral  SpO2: 99%  Weight: 213 lb 8 oz (96.8 kg)  Height: 5\' 10"  (1.778 m)   Physical Exam  Constitutional: She appears well-developed and well-nourished. No distress.  HENT:  Mouth/Throat: Oropharynx is clear and moist.  Eyes: Conjunctivae and EOM are normal.  Cardiovascular: Normal rate and regular rhythm. Exam reveals no friction rub.  No murmur heard. 2+ radial, dorsalis pedis, posterior tibial pulses bilaterally  Respiratory: Effort normal. No respiratory distress. She has no wheezes. She has no rales.  GI: Soft. Bowel sounds are normal. She exhibits no distension. There is no tenderness.  Musculoskeletal: She exhibits no  edema (of bilateral lower extremities) or tenderness (of bilateral lower extremities).  Skin: Skin is warm and dry. No rash noted. She is not diaphoretic. No erythema.   Assessment & Plan:   See Encounters Tab for problem based charting.  Patient discussed with Dr. Eppie Gibson

## 2018-01-03 NOTE — Assessment & Plan Note (Signed)
Patient stable on ranitidine 150 mg BID wihtout symptoms of reflux on ROS. Requesting refills today. Refills provided.

## 2018-01-03 NOTE — Assessment & Plan Note (Addendum)
Patient's BP elevated today above goal of <140/90 today. Patient has not taken hydrochlorothiazide for a few days prior to today's visit. Will hold off making changes to this regimen for now until it is known how BP responds to diuretic.  Plan: -BMP today -HCTZ 25 mg, Lisinopril 40 mg daily, and metoprolol 100 mg BID -If BP and electrolytes OK on combination of HCTZ and lisinopril, consider trial of combination pill at lower dose lisinopril-hctz 20-25 mg to further reduce polypharmacy since patient overwhelmed with medications as of now -If BP elevated at next visit would suggest adding amlodipine to patient's regimen. Alternatively could also consider switching from metoprolol to carvedilol if BP elevated at next visit and BP still elevated

## 2018-01-03 NOTE — Assessment & Plan Note (Signed)
Improvement in symptoms of dizziness since last visit. No recent falls, focal weakness. Patient requesting refill of meclizine which helps resolve intermittent symptoms. Refill provided.

## 2018-01-03 NOTE — Patient Instructions (Addendum)
Thank you for seeing [place] in the clinic today!  You were evaluated for high blood pressure and diabetes.   Your A1C was elevated to 9.9% today, above goal of <7%. For your diabetes, please increase your insulin dose from 33 to 35 units per night. Please continue taking metformin 1000 mg two times a day. You will need to start taking Januvia 100 mg every morning for your diabetes.  For your blood pressure, please continue taking metoprolol and lisinopril daily. You will need to continue taking hydrochlorothiazide each day as well. Please pick up this medication from your pharmacy.  To help with your blood pressure and diabetes, as we discussed, it would be best for you to increase your physical activity and to reduce the amount of sweets you take in each day. Please consider walking outside for 30-60 minutes a day to help loose weight and help with chronic joint pain.   Please return to the clinic in 1 months for follow up of your diabetes and high blood pressure. To help sort through your medications, please bring all pill bottles to your next visit.   If you have any questions or concerns, please call our clinic at [phone] between the hours of 9am-5pm. If you have a problem after these hours, please call [phone] and ask for the internal medicine resident on call. If you feel you are having a medical emergency please call 911.   Thanks, Dr. Larena Glassman Coti Burd

## 2018-01-03 NOTE — Assessment & Plan Note (Addendum)
Patient with intermittent atypical chest wall pain that is chronic in nature (L sided, mid axillary line, reproducible on exam, self resolves). Patient also reports intermittent joint pain in bilateral lower extremities. Patient states pain resolves with use of 2-3 pain pills (50 mg Tramadol) a day. Patient requesting refills of this medication today. Refills provided.  Plan: -Continue tramadol 50 mg TID PRN, refills provided -Consider pain contract at next visit. Patient really seems to need only 50 mg BID PRN so could consider reducing this dose if patient amenable.

## 2018-01-03 NOTE — Assessment & Plan Note (Addendum)
Patient's A1C elevated to 9.9% today. Patient compliant with metformin 1000 mg BID but notes recent weight gain and dietary indiscretion. Patient also self-administering insulin at slightly lower dose (33 units) than previously prescribed. Patient did not bring glucose log to visit today, but encouraged to bring to next visit. Patient counseled about diet and lifestyle modifications to reduce A1C. Will also plan to administer insulin at dose of 35 units daily and add 2nd hypoglycemic agent to regimen in addition to these diet and lifestyle modifications.   Plan: -Continue metformin 1000 mg BID -START januvia 100 mg daily -START levemir 25 units nightly  -Consider combination pill at next visit if Januvia well tolerated to help reduce polypharmacy -RTC in 1-2 months for BMP

## 2018-01-04 LAB — BMP8+ANION GAP
ANION GAP: 14 mmol/L (ref 10.0–18.0)
BUN/Creatinine Ratio: 10 — ABNORMAL LOW (ref 12–28)
BUN: 7 mg/dL — ABNORMAL LOW (ref 8–27)
CALCIUM: 8.8 mg/dL (ref 8.7–10.3)
CO2: 23 mmol/L (ref 20–29)
CREATININE: 0.71 mg/dL (ref 0.57–1.00)
Chloride: 104 mmol/L (ref 96–106)
GFR, EST AFRICAN AMERICAN: 100 mL/min/{1.73_m2} (ref >59)
GFR, EST NON AFRICAN AMERICAN: 87 mL/min/{1.73_m2} (ref >59)
Glucose: 130 mg/dL — ABNORMAL HIGH (ref 65–99)
POTASSIUM: 4 mmol/L (ref 3.5–5.2)
SODIUM: 141 mmol/L (ref 134–144)

## 2018-01-08 NOTE — Progress Notes (Signed)
Case discussed with Dr. Nedrud at the time of the visit.  We reviewed the resident's history and exam and pertinent patient test results.  I agree with the assessment, diagnosis and plan of care documented in the resident's note. 

## 2018-01-19 DIAGNOSIS — F209 Schizophrenia, unspecified: Secondary | ICD-10-CM | POA: Diagnosis not present

## 2018-01-25 ENCOUNTER — Other Ambulatory Visit: Payer: Self-pay | Admitting: Internal Medicine

## 2018-01-25 DIAGNOSIS — Z1231 Encounter for screening mammogram for malignant neoplasm of breast: Secondary | ICD-10-CM

## 2018-02-15 ENCOUNTER — Ambulatory Visit: Payer: Self-pay

## 2018-02-23 ENCOUNTER — Encounter: Payer: Self-pay | Admitting: *Deleted

## 2018-02-28 ENCOUNTER — Other Ambulatory Visit: Payer: Self-pay | Admitting: Internal Medicine

## 2018-02-28 DIAGNOSIS — Z79891 Long term (current) use of opiate analgesic: Secondary | ICD-10-CM

## 2018-02-28 NOTE — Telephone Encounter (Signed)
Patient daughter is stating the pharmacy is needs a approval for the medicine (tramadol) they are charge more than normal.   Call patient and pharmacy [facility] on [address]

## 2018-03-01 MED ORDER — TRAMADOL HCL 50 MG PO TABS: 50.0000 mg | ORAL_TABLET | Freq: Three times a day (TID) | ORAL | 2 refills | Status: DC | PRN

## 2018-03-01 NOTE — Telephone Encounter (Signed)
Spoke with Dorian at [facility]. States full DEA from previous PCP not going through insurance. Will route refill request to current Attending. Hubbard Hartshorn, RN, BSN

## 2018-03-01 NOTE — Telephone Encounter (Signed)
Provided a 3 month Rx. Please schedule a visit within this time with her new PCP

## 2018-03-08 ENCOUNTER — Ambulatory Visit
Admission: RE | Admit: 2018-03-08 | Discharge: 2018-03-08 | Disposition: A | Discharge status: Home / Self Care | Payer: Medicare Other | Source: Ambulatory Visit | Attending: *Deleted | Admitting: *Deleted

## 2018-03-08 DIAGNOSIS — Z1231 Encounter for screening mammogram for malignant neoplasm of breast: Secondary | ICD-10-CM | POA: Diagnosis not present

## 2018-03-23 ENCOUNTER — Other Ambulatory Visit: Payer: Self-pay | Admitting: Internal Medicine

## 2018-03-23 DIAGNOSIS — E1165 Type 2 diabetes mellitus with hyperglycemia: Secondary | ICD-10-CM

## 2018-03-23 DIAGNOSIS — I10 Essential (primary) hypertension: Secondary | ICD-10-CM

## 2018-03-23 DIAGNOSIS — Z794 Long term (current) use of insulin: Secondary | ICD-10-CM

## 2018-03-23 DIAGNOSIS — IMO0001 Reserved for inherently not codable concepts without codable children: Secondary | ICD-10-CM

## 2018-04-20 ENCOUNTER — Telehealth: Payer: Self-pay

## 2018-04-20 NOTE — Telephone Encounter (Signed)
Spoke with patient today. Patient has moved to [place] 10 months ago and is no longer our patient. Just wanted to make a note in the [employer], RMA

## 2018-05-02 ENCOUNTER — Other Ambulatory Visit: Payer: Self-pay | Admitting: Internal Medicine

## 2018-05-02 DIAGNOSIS — H8190 Unspecified disorder of vestibular function, unspecified ear: Secondary | ICD-10-CM

## 2018-06-13 ENCOUNTER — Encounter: Payer: Self-pay | Admitting: Internal Medicine

## 2018-06-13 NOTE — Progress Notes (Deleted)
CC: ***  HPI:  Ms.Yalda Estle Sabella is a 72 y.o. with a PMH listed below presenting for follow up of her chronic medical conditions.   Diabetes mellitus type 2: Assessment: Patient is currently on metformin thousand milligrams twice daily, Januvia 100 mg daily, and Levemir 25 units nightly.  Last A1c in all 01/03/2018 9.9.  BMP showed a BUN 7 creatinine 0.71. A1c at this visit was ***. She checks her blood sugars *** and it normally runs around ***.       Please see A&P for status of the patient's chronic medical conditions  Past Medical History:  Diagnosis Date  . Atypical nevus   . Breast cancer ([place]) 07/14/06   Left breast. Ductal carcinoma, grade 3. Followed by Dr. Truddie Coco. s/p lumpectomy with radiation  . Dental caries   . Diabetes mellitus   . DVT (deep vein thrombosis) in pregnancy ([place]) 1982   Affected the RLE. Required coumadin.  . Fibroids   . GERD (gastroesophageal reflux disease)   . Goiter    U/S 08/2009-persistent multiple small nodules, one in the right lower pole slightly enlarged, recommend continued followup.  Marland Kitchen History of mitral valve prolapse   . Hyperlipidemia   . Hypertension   . Ovarian cyst   . Schizophrenia ([place])    Review of Systems:  ***  Physical Exam:  There were no vitals filed for this visit. ***  Social History   Socioeconomic History  . Marital status: Widowed    Spouse name: Not on file  . Number of children: Not on file  . Years of education: Not on file  . Highest education level: Not on file  Occupational History  . Not on file  Social Needs  . Financial resource strain: Not on file  . Food insecurity:    Worry: Not on file    Inability: Not on file  . Transportation needs:    Medical: Not on file    Non-medical: Not on file  Tobacco Use  . Smoking status: Former Research scientist (life sciences)  . Smokeless tobacco: Never Used  . Tobacco comment: quit 9 yrs ago.  Substance and Sexual Activity  . Alcohol use: No    Alcohol/week: 0.0 standard  drinks  . Drug use: No  . Sexual activity: Not on file  Lifestyle  . Physical activity:    Days per week: Not on file    Minutes per session: Not on file  . Stress: Not on file  Relationships  . Social connections:    Talks on phone: Not on file    Gets together: Not on file    Attends religious service: Not on file    Active member of club or organization: Not on file    Attends meetings of clubs or organizations: Not on file    Relationship status: Not on file  . Intimate partner violence:    Fear of current or ex partner: Not on file    Emotionally abused: Not on file    Physically abused: Not on file    Forced sexual activity: Not on file  Other Topics Concern  . Not on file  Social History Narrative   Widow.   Used to work as a Quarry manager, now retired.         *** Family History  Problem Relation Age of Onset  . Diabetes Mother   . Diabetes Father   . Breast cancer Neg Hx     Assessment & Plan:   See Encounters  Tab for problem based charting.  Patient seen with Dr. Illa Level. Hoffman","Klima","Mullen","Narendra","Raines","Vincent"}

## 2018-06-14 ENCOUNTER — Other Ambulatory Visit: Payer: Self-pay | Admitting: Internal Medicine

## 2018-06-14 DIAGNOSIS — E1165 Type 2 diabetes mellitus with hyperglycemia: Principal | ICD-10-CM

## 2018-06-14 DIAGNOSIS — IMO0001 Reserved for inherently not codable concepts without codable children: Secondary | ICD-10-CM

## 2018-06-14 DIAGNOSIS — Z794 Long term (current) use of insulin: Principal | ICD-10-CM

## 2018-06-15 NOTE — Telephone Encounter (Signed)
Patient recently no showed office visit with Dr. Sherry Ruffing on 06-13-18.  Next available will be 07-25-18 at 3:15 pm.  Appointment card will be mailed.

## 2018-07-01 ENCOUNTER — Other Ambulatory Visit: Payer: Self-pay | Admitting: *Deleted

## 2018-07-01 DIAGNOSIS — Z79891 Long term (current) use of opiate analgesic: Secondary | ICD-10-CM

## 2018-07-05 MED ORDER — TRAMADOL HCL 50 MG PO TABS: 50.0000 mg | ORAL_TABLET | Freq: Three times a day (TID) | ORAL | 0 refills | Status: DC | PRN

## 2018-07-25 ENCOUNTER — Encounter: Payer: Self-pay | Admitting: Internal Medicine

## 2018-07-25 NOTE — Progress Notes (Deleted)
CC: ***  HPI:  Ms.Rosabelle Tieshia Rettinger is a 72 y.o.  with a PMH listed below presenting for follow-up of her chronic medical conditions.  In regards to her hypertension she is currently on HCTZ 25 mg, lisinopril 30 mg, and metoprolol 100 mg twice daily.  Blood pressure today was***.  BMP in April was a normal.  We can likely switch her to Zestoretic in place of the lisinopril and HCTZ which may help with the pill burden.  Plan: -Start Zestoretic  In regards to her schizophrenia she is currently on quetiapine 50 mg and olanzapine 10 mg daily.   Plan: -Continue quetiapine 50 mg and olanzapine 10 mg daily  Regarding her diabetes she is currently on metformin 1000 mg twice daily, Januvia 100 mg daily and Levemir 25 units daily.  A1c in April was 9.9.  She reports that she***been taking her medications regularly.  She***episodes of hypoglycemia.  A1c today is***.  Given her history of schizophrenia may be beneficial to decrease the amount of pills that she is taking to reduce polypharmacy.  Plan: Consider Janumet, discontinue Januvia and metformin -Continue Levemir 25 units daily -A1c today -Referral for eye exam  Please see A&P for status of the patient's chronic medical conditions  Past Medical History:  Diagnosis Date  . Atypical nevus   . Breast cancer ([place]) 07/14/06   Left breast. Ductal carcinoma, grade 3. Followed by Dr. Truddie Coco. s/p lumpectomy with radiation  . Dental caries   . Diabetes mellitus   . DVT (deep vein thrombosis) in pregnancy ([place]) 1982   Affected the RLE. Required coumadin.  . Fibroids   . GERD (gastroesophageal reflux disease)   . Goiter    U/S 08/2009-persistent multiple small nodules, one in the right lower pole slightly enlarged, recommend continued followup.  Marland Kitchen History of mitral valve prolapse   . Hyperlipidemia   . Hypertension   . Ovarian cyst   . Schizophrenia ([place])    Review of Systems: Refer to history of present illness and assessment and plans for  pertinent review of systems, all others reviewed and negative.  Physical Exam:  There were no vitals filed for this visit. *** Physical Exam  Social History   Socioeconomic History  . Marital status: Widowed    Spouse name: Not on file  . Number of children: Not on file  . Years of education: Not on file  . Highest education level: Not on file  Occupational History  . Not on file  Social Needs  . Financial resource strain: Not on file  . Food insecurity:    Worry: Not on file    Inability: Not on file  . Transportation needs:    Medical: Not on file    Non-medical: Not on file  Tobacco Use  . Smoking status: Former Research scientist (life sciences)  . Smokeless tobacco: Never Used  . Tobacco comment: quit 9 yrs ago.  Substance and Sexual Activity  . Alcohol use: No    Alcohol/week: 0.0 standard drinks  . Drug use: No  . Sexual activity: Not on file  Lifestyle  . Physical activity:    Days per week: Not on file    Minutes per session: Not on file  . Stress: Not on file  Relationships  . Social connections:    Talks on phone: Not on file    Gets together: Not on file    Attends religious service: Not on file    Active member of club or organization: Not on  file    Attends meetings of clubs or organizations: Not on file    Relationship status: Not on file  . Intimate partner violence:    Fear of current or ex partner: Not on file    Emotionally abused: Not on file    Physically abused: Not on file    Forced sexual activity: Not on file  Other Topics Concern  . Not on file  Social History Narrative   Widow.   Used to work as a Quarry manager, now retired.         *** Family History  Problem Relation Age of Onset  . Diabetes Mother   . Diabetes Father   . Breast cancer Neg Hx     Assessment & Plan:   See Encounters Tab for problem based charting.  Patient seen with Dr. Illa Level. Hoffman","Klima","Mullen","Narendra","Raines","Vincent"};

## 2018-07-27 DIAGNOSIS — F209 Schizophrenia, unspecified: Secondary | ICD-10-CM | POA: Diagnosis not present

## 2018-08-25 ENCOUNTER — Other Ambulatory Visit: Payer: Self-pay

## 2018-08-25 DIAGNOSIS — I10 Essential (primary) hypertension: Secondary | ICD-10-CM

## 2018-08-25 DIAGNOSIS — Z79891 Long term (current) use of opiate analgesic: Secondary | ICD-10-CM

## 2018-08-25 NOTE — Telephone Encounter (Signed)
traMADol (ULTRAM) 50 MG tablet,  potassium chloride (K-DUR) 10 MEQ tablet, REFILL REQUEST @ [facility] ON [address]

## 2018-08-27 MED ORDER — POTASSIUM CHLORIDE ER 10 MEQ PO TBCR: 10.0000 meq | EXTENDED_RELEASE_TABLET | Freq: Every day | ORAL | 1 refills | Status: DC

## 2018-08-27 MED ORDER — TRAMADOL HCL 50 MG PO TABS: 50.0000 mg | ORAL_TABLET | Freq: Three times a day (TID) | ORAL | 0 refills | Status: DC | PRN

## 2018-09-05 ENCOUNTER — Other Ambulatory Visit: Payer: Self-pay | Admitting: Internal Medicine

## 2018-09-05 DIAGNOSIS — IMO0001 Reserved for inherently not codable concepts without codable children: Secondary | ICD-10-CM

## 2018-09-05 DIAGNOSIS — E1165 Type 2 diabetes mellitus with hyperglycemia: Principal | ICD-10-CM

## 2018-09-05 DIAGNOSIS — Z794 Long term (current) use of insulin: Principal | ICD-10-CM

## 2018-09-05 NOTE — Telephone Encounter (Signed)
Next appt scheduled 11/14/18 with PCP.

## 2018-10-10 ENCOUNTER — Encounter: Payer: Self-pay | Admitting: *Deleted

## 2018-10-10 ENCOUNTER — Other Ambulatory Visit: Payer: Self-pay | Admitting: Internal Medicine

## 2018-10-10 DIAGNOSIS — Z79891 Long term (current) use of opiate analgesic: Secondary | ICD-10-CM

## 2018-10-11 NOTE — Telephone Encounter (Signed)
Needs refill on   traMADol (ULTRAM) 50 MG tablet  [facility] ([facility] [place] [facility] [place]; pt contact [phone]    Pt daughter is calling regarding medicine

## 2018-10-13 NOTE — Telephone Encounter (Signed)
Pt's daughter states the pharmacy is still waiting for the clinic to reply back on tramadol. Please call back.

## 2018-10-14 ENCOUNTER — Other Ambulatory Visit: Payer: Self-pay

## 2018-10-14 ENCOUNTER — Ambulatory Visit (INDEPENDENT_AMBULATORY_CARE_PROVIDER_SITE_OTHER): Payer: Medicare Other | Admitting: Internal Medicine

## 2018-10-14 ENCOUNTER — Encounter: Payer: Self-pay | Admitting: Internal Medicine

## 2018-10-14 VITALS — BP 148/70 | HR 74 | Wt 206.8 lb

## 2018-10-14 DIAGNOSIS — Z794 Long term (current) use of insulin: Secondary | ICD-10-CM | POA: Diagnosis not present

## 2018-10-14 DIAGNOSIS — F209 Schizophrenia, unspecified: Secondary | ICD-10-CM | POA: Diagnosis not present

## 2018-10-14 DIAGNOSIS — I10 Essential (primary) hypertension: Secondary | ICD-10-CM

## 2018-10-14 DIAGNOSIS — Z0000 Encounter for general adult medical examination without abnormal findings: Secondary | ICD-10-CM

## 2018-10-14 DIAGNOSIS — K219 Gastro-esophageal reflux disease without esophagitis: Secondary | ICD-10-CM | POA: Diagnosis not present

## 2018-10-14 DIAGNOSIS — R0789 Other chest pain: Secondary | ICD-10-CM | POA: Diagnosis not present

## 2018-10-14 DIAGNOSIS — G8929 Other chronic pain: Secondary | ICD-10-CM

## 2018-10-14 DIAGNOSIS — E1165 Type 2 diabetes mellitus with hyperglycemia: Secondary | ICD-10-CM

## 2018-10-14 DIAGNOSIS — Z79891 Long term (current) use of opiate analgesic: Secondary | ICD-10-CM

## 2018-10-14 DIAGNOSIS — IMO0001 Reserved for inherently not codable concepts without codable children: Secondary | ICD-10-CM

## 2018-10-14 LAB — GLUCOSE, CAPILLARY: Glucose-Capillary: 79 mg/dL (ref 70–99)

## 2018-10-14 LAB — POCT GLYCOSYLATED HEMOGLOBIN (HGB A1C): HEMOGLOBIN A1C: 6.5 % — AB (ref 4.0–5.6)

## 2018-10-14 MED ORDER — DICLOFENAC SODIUM 1 % TD GEL: 4.0000 g | Freq: Four times a day (QID) | TRANSDERMAL | 0 refills | Status: DC

## 2018-10-14 MED ORDER — METOPROLOL TARTRATE 50 MG PO TABS: 150.0000 mg | ORAL_TABLET | Freq: Two times a day (BID) | ORAL | 3 refills | Status: DC

## 2018-10-14 MED ORDER — TRAMADOL HCL 50 MG PO TABS: 50.0000 mg | ORAL_TABLET | Freq: Two times a day (BID) | ORAL | 2 refills | Status: DC | PRN

## 2018-10-14 MED ORDER — SITAGLIPTIN PHOSPHATE 100 MG PO TABS: ORAL_TABLET | ORAL | 1 refills | Status: DC

## 2018-10-14 NOTE — Patient Instructions (Addendum)
Thank you for allowing [place] to provide your care today, it was nice meeting you. Today we discussed your chest pain, diabetes, hypertension, and other medical conditions.   I have ordered some labs for you. I will call if any are abnormal.    Your blood pressure was a little high today, please increase your metoprolol to 150mg  twice a day. I have sent a prescription to your new pharmacy.  For your diabetes, please check your blood sugars 3-4 times a day and keep a record of it. I am concerned that your blood sugars may be too low. If your blood sugars are going too low please let us know so that we can make adjustments to your medications. Please contact your eye doctor to make an appointment.  For your chest pain, I have sent in a prescription for the tramadol. Try taking the voltaren gel prior to taking the tramadol.    Please follow-up in 1-2 months.    Should you have any questions or concerns please call the internal medicine clinic at [phone].

## 2018-10-14 NOTE — Progress Notes (Signed)
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  She was last seen on 01/03/2018, she felt well at that time and her chest wall pain had been well controlled with the tramadol.  Her A1c was elevated at that time and she was started on Januvia 100 mg daily and Levemir 35 units nightly.   Please see A&P for status of the patient's chronic medical conditions  Past Medical History:  Diagnosis Date  . Atypical nevus   . Breast cancer ([place]) 07/14/06   Left breast. Ductal carcinoma, grade 3. Followed by Dr. Truddie Coco. s/p lumpectomy with radiation  . Dental caries   . Diabetes mellitus   . DVT (deep vein thrombosis) in pregnancy 1982   Affected the RLE. Required coumadin.  . Fibroids   . GERD (gastroesophageal reflux disease)   . Goiter    U/S 08/2009-persistent multiple small nodules, one in the right lower pole slightly enlarged, recommend continued followup.  Marland Kitchen History of mitral valve prolapse   . Hyperlipidemia   . Hypertension   . Ovarian cyst   . Schizophrenia ([place])    Review of Systems: Refer to history of present illness and assessment and plans for pertinent review of systems, all others reviewed and negative.  Physical Exam:  Vitals:   10/14/18 1409 10/14/18 1443  BP: (!) 150/66 (!) 148/70  Pulse: 74 74  SpO2: 100%   Weight: 206 lb 12.8 oz (93.8 kg)    Physical Exam  Constitutional: She is oriented to person, place, and time and well-developed, well-nourished, and in no distress.  HENT:  Head: Normocephalic and atraumatic.  Eyes: Pupils are equal, round, and reactive to light. Conjunctivae and EOM are normal.  Neck: Normal range of motion. Neck supple.  Cardiovascular: Normal rate, regular rhythm and normal heart sounds.  Pulmonary/Chest: Effort normal and  breath sounds normal. No respiratory distress.  Abdominal: Soft. Bowel sounds are normal. She exhibits no distension.  Musculoskeletal: Normal range of motion.        General: No edema.  Neurological: She is alert and oriented to person, place, and time.  Skin: Skin is warm and dry.  Psychiatric: Mood and affect normal.    Social History   Socioeconomic History  . Marital status: Widowed    Spouse name: Not on file  . Number of children: Not on file  . Years of education: Not on file  . Highest education level: Not on file  Occupational History  . Not on file  Social Needs  . Financial resource strain: Not on file  . Food insecurity:    Worry: Not on file    Inability: Not on file  . Transportation needs:    Medical: Not on file    Non-medical: Not on file  Tobacco Use  . Smoking status: Former Research scientist (life sciences)  . Smokeless tobacco: Never Used  . Tobacco comment: quit 9 yrs ago.  Substance and Sexual Activity  . Alcohol use: No    Alcohol/week: 0.0 standard drinks  . Drug use: No  . Sexual activity: Not on file  Lifestyle  . Physical activity:    Days per week: Not on file    Minutes per session: Not on file  . Stress: Not on file  Relationships  .  Social connections:    Talks on phone: Not on file    Gets together: Not on file    Attends religious service: Not on file    Active member of club or organization: Not on file    Attends meetings of clubs or organizations: Not on file    Relationship status: Not on file  . Intimate partner violence:    Fear of current or ex partner: Not on file    Emotionally abused: Not on file    Physically abused: Not on file    Forced sexual activity: Not on file  Other Topics Concern  . Not on file  Social History Narrative   Widow.   Used to work as a Quarry manager, now retired.         Family History  Problem Relation Age of Onset  . Diabetes Mother   . Diabetes Father   . Breast cancer Neg Hx     Assessment & Plan:   See  Encounters Tab for problem based charting.  Patient discussed with Dr. Dareen Piano

## 2018-10-16 NOTE — Assessment & Plan Note (Signed)
She is still taking ranitidine 150 mg BID which she reports controls her symptoms.   Plan: -Continue ranitidine 150 mg BID

## 2018-10-16 NOTE — Assessment & Plan Note (Signed)
Patient is currently on HCTZ 25 mg daily, lisinopril 40 mg daily, and metoprolol 100 g twice daily.  Blood pressure today was elevated to 150/66, repeat was 148/70.  Goal blood pressure of less than 140/90. She denied any side effects to the medications.   Plan: -Increase metoprolol to 150 mg BID -Continue HCTZ 25 mg daily and lisinopril 40 mg daily -Repeat BMP today -RTC in 1 month to follow up BP

## 2018-10-16 NOTE — Assessment & Plan Note (Signed)
Patient is currently prescribed metformin 1000 mg twice daily, Januvia 100 mg daily, and Levemir 35 units nightly.  Her last A1c was 9.9 and today it is at 6.5.  Patient reports that she does feel like she has lower blood sugars about once a week however her blood sugars aren't low when she checks it.  She checks her sugars 1-2 times a day, and it is around 150-200s normally.  She has not seen an eye doctor in the last year but the daughter states that she will make her an appointment. We discussed the CGM to see if her blood sugars get too low during the day but the patient declined. I told her that it would not be painful and that she would just have a small device attached to her for 2 weeks but she did not want to do this. Since she does not want to do a CGM I asked her to check her blood sugars 3-4 times a day and every time that she feels like it's too low and to RTC in 1 month and bring in her glucometer for [place] to look at. She is agreeable to that plan.   Plan: -Continue metformin 1000 mg BID -Continue januvia 100 mg daily -Continue levemir 25 units nightly -Patient will check her CBGs 3-4 times a day and will bring in her glucometer on her next visit to make sure that it's calibrated. -RTC in 1 month to follow up

## 2018-10-16 NOTE — Assessment & Plan Note (Signed)
Patient had mammogram done which was negative. Patient declined any vaccinations and colonoscopy screening.

## 2018-10-16 NOTE — Assessment & Plan Note (Signed)
Patient is still on olanzapine and seroquel. She is followed by [facility] for this.   -Continue current regimen and follow up with [facility].

## 2018-10-16 NOTE — Assessment & Plan Note (Signed)
Patient has atypical chest pain that is chronic in nature, it is the substernal area is reproducible on exam. 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 She reports that she takes 2 50 mg tramadol tablets a day.  She last used 1 week ago. Checked PMP aware and she has been filling this regularly, does not fill this every month likely due to being prescribed too much.  She does not have any other opioids prescribed.  We went over a pain contract today, for tramadol 50 mg BID PRN and she is agreeable to this.   Plan: -Pain contract completed -Consider UDS on follow up visits -Refill tramadol 50 mg BID PRN

## 2018-10-17 NOTE — Addendum Note (Signed)
Addended by: Aldine Contes on: 10/17/2018 09:45 AM   Modules accepted: Level of Service

## 2018-10-17 NOTE — Progress Notes (Signed)
Internal Medicine Clinic Attending  Case discussed with Dr. Krienke at the time of the visit.  We reviewed the resident's history and exam and pertinent patient test results.  I agree with the assessment, diagnosis, and plan of care documented in the resident's note.    

## 2018-10-20 DIAGNOSIS — F209 Schizophrenia, unspecified: Secondary | ICD-10-CM | POA: Diagnosis not present

## 2018-11-14 ENCOUNTER — Encounter: Payer: Self-pay | Admitting: Internal Medicine

## 2018-12-15 NOTE — Addendum Note (Signed)
Addended by: Asencion Noble on: 12/15/2018 01:09 PM   Modules accepted: Orders

## 2018-12-19 ENCOUNTER — Encounter: Payer: Medicare Other | Admitting: Internal Medicine

## 2018-12-19 ENCOUNTER — Other Ambulatory Visit: Payer: Self-pay

## 2019-01-02 ENCOUNTER — Other Ambulatory Visit: Payer: Self-pay | Admitting: Internal Medicine

## 2019-01-02 DIAGNOSIS — IMO0001 Reserved for inherently not codable concepts without codable children: Secondary | ICD-10-CM

## 2019-01-02 DIAGNOSIS — E1165 Type 2 diabetes mellitus with hyperglycemia: Secondary | ICD-10-CM

## 2019-01-02 DIAGNOSIS — Z794 Long term (current) use of insulin: Secondary | ICD-10-CM

## 2019-01-02 DIAGNOSIS — Z79891 Long term (current) use of opiate analgesic: Secondary | ICD-10-CM

## 2019-01-17 ENCOUNTER — Other Ambulatory Visit: Payer: Self-pay | Admitting: Internal Medicine

## 2019-01-17 DIAGNOSIS — IMO0001 Reserved for inherently not codable concepts without codable children: Secondary | ICD-10-CM

## 2019-02-03 ENCOUNTER — Other Ambulatory Visit: Payer: Self-pay | Admitting: Internal Medicine

## 2019-02-03 DIAGNOSIS — Z79891 Long term (current) use of opiate analgesic: Secondary | ICD-10-CM

## 2019-02-07 ENCOUNTER — Other Ambulatory Visit: Payer: Self-pay | Admitting: Internal Medicine

## 2019-02-07 DIAGNOSIS — I10 Essential (primary) hypertension: Secondary | ICD-10-CM

## 2019-02-07 DIAGNOSIS — IMO0001 Reserved for inherently not codable concepts without codable children: Secondary | ICD-10-CM

## 2019-02-15 ENCOUNTER — Telehealth: Payer: Self-pay

## 2019-02-15 DIAGNOSIS — E1165 Type 2 diabetes mellitus with hyperglycemia: Secondary | ICD-10-CM

## 2019-02-27 NOTE — Telephone Encounter (Signed)
Spoke with patient's daughter about switching patient from [place] to [place] to add heart protection. She thought this would be a good switch for her mother and said we could go ahead and send the prescription to the [facility] on [address].  I did make the patient's daughter aware that patient would require labs 2 weeks after starting the medication to check her kidney function. I will call back later this week to try and see if we can schedule the patient for labs.  She also asked if patient was scheduled for an upcoming appointment. I told her that the patient was not scheduled but that I could contact the front desk about scheduling and she said that she would call and get the appointment scheduled for her mother instead. Told her to call us if she has any questions.

## 2019-02-28 MED ORDER — JARDIANCE 10 MG PO TABS: 10.0000 mg | ORAL_TABLET | Freq: Every day | ORAL | 0 refills | Status: DC

## 2019-02-28 NOTE — Telephone Encounter (Signed)
Sounds good, Sterling! I sent the Jardiance prescription. If you need to call patient for follow up, would be good to find out what her home BG are and try to do the hypoglycemia survey. I believe Dr. Sherry Ruffing offered patient CGM in the past due to being elderly and taking insulin but patient declined.  Thank you!

## 2019-03-03 ENCOUNTER — Other Ambulatory Visit: Payer: Self-pay | Admitting: Internal Medicine

## 2019-03-03 DIAGNOSIS — IMO0001 Reserved for inherently not codable concepts without codable children: Secondary | ICD-10-CM

## 2019-03-03 DIAGNOSIS — Z79891 Long term (current) use of opiate analgesic: Secondary | ICD-10-CM

## 2019-03-07 ENCOUNTER — Telehealth: Payer: Self-pay

## 2019-03-07 DIAGNOSIS — E1165 Type 2 diabetes mellitus with hyperglycemia: Secondary | ICD-10-CM

## 2019-03-14 ENCOUNTER — Other Ambulatory Visit: Payer: Self-pay | Admitting: Internal Medicine

## 2019-03-14 DIAGNOSIS — Z1231 Encounter for screening mammogram for malignant neoplasm of breast: Secondary | ICD-10-CM

## 2019-03-14 NOTE — Telephone Encounter (Signed)
Tried contacting patient for follow up after starting empagliflozin, unable to reach. Patient has [facility] appointment 03/15/19, BMP and microalb:cr ordered. Will try to reach patient again in 1 week for follow up.

## 2019-03-14 NOTE — Progress Notes (Deleted)
   CC: ***  HPI:  [name] is a 73 y.o.  with a PMH listed below presenting for ***   Please see A&P for status of the patient's chronic medical conditions  Past Medical History:  Diagnosis Date  . Atypical nevus   . Breast cancer ([place]) 07/14/06   Left breast. Ductal carcinoma, grade 3. Followed by Dr. Truddie Coco. s/p lumpectomy with radiation  . Dental caries   . Diabetes mellitus   . DVT (deep vein thrombosis) in pregnancy 1982   Affected the RLE. Required coumadin.  . Fibroids   . GERD (gastroesophageal reflux disease)   . Goiter    U/S 08/2009-persistent multiple small nodules, one in the right lower pole slightly enlarged, recommend continued followup.  Marland Kitchen History of mitral valve prolapse   . Hyperlipidemia   . Hypertension   . Ovarian cyst   . Schizophrenia ([place])    Review of Systems: Refer to history of present illness and assessment and plans for pertinent review of systems, all others reviewed and negative.  Physical Exam:  There were no vitals filed for this visit. *** Physical Exam  Social History   Socioeconomic History  . Marital status: Widowed    Spouse name: Not on file  . Number of children: Not on file  . Years of education: Not on file  . Highest education level: Not on file  Occupational History  . Not on file  Social Needs  . Financial resource strain: Not on file  . Food insecurity    Worry: Not on file    Inability: Not on file  . Transportation needs    Medical: Not on file    Non-medical: Not on file  Tobacco Use  . Smoking status: Former Research scientist (life sciences)  . Smokeless tobacco: Never Used  . Tobacco comment: quit 9 yrs ago.  Substance and Sexual Activity  . Alcohol use: No    Alcohol/week: 0.0 standard drinks  . Drug use: No  . Sexual activity: Not on file  Lifestyle  . Physical activity    Days per week: Not on file    Minutes per session: Not on file  . Stress: Not on file  Relationships  . Social Herbalist on phone:  Not on file    Gets together: Not on file    Attends religious service: Not on file    Active member of club or organization: Not on file    Attends meetings of clubs or organizations: Not on file    Relationship status: Not on file  . Intimate partner violence    Fear of current or ex partner: Not on file    Emotionally abused: Not on file    Physically abused: Not on file    Forced sexual activity: Not on file  Other Topics Concern  . Not on file  Social History Narrative   Widow.   Used to work as a Quarry manager, now retired.         *** Family History  Problem Relation Age of Onset  . Diabetes Mother   . Diabetes Father   . Breast cancer Neg Hx     Assessment & Plan:   See Encounters Tab for problem based charting.  Patient {GC/GE:3044014::"discussed with","seen with"} Dr. {NAMES:3044014::"Butcher","Granfortuna","E. Hoffman","Klima","Mullen","Narendra","Raines","Vincent"}

## 2019-03-15 ENCOUNTER — Encounter: Payer: Medicare Other | Admitting: Internal Medicine

## 2019-03-16 DIAGNOSIS — F209 Schizophrenia, unspecified: Secondary | ICD-10-CM | POA: Diagnosis not present

## 2019-03-17 ENCOUNTER — Telehealth: Payer: Self-pay | Admitting: Internal Medicine

## 2019-03-17 NOTE — Telephone Encounter (Signed)
Returned call to patient. No answer. Left message on VM that tramadol and levemir were sent to [facility] on [address] on 03/12/2019. All pharmacies deleted except [facility] on [address]. Refill for ranitidine sent to PCP. Hubbard Hartshorn, RN, BSN

## 2019-03-17 NOTE — Telephone Encounter (Signed)
Refill request-  Pharmacy has already been contacted a couple of days ago and no refill. [id number] - [place] ([place]), [place] - [address]   ranitidine (ZANTAC) 150 MG tablet    traMADol (ULTRAM) 50 MG tablet

## 2019-03-23 ENCOUNTER — Other Ambulatory Visit: Payer: Self-pay | Admitting: Internal Medicine

## 2019-03-23 DIAGNOSIS — Z79891 Long term (current) use of opiate analgesic: Secondary | ICD-10-CM

## 2019-03-23 DIAGNOSIS — I10 Essential (primary) hypertension: Secondary | ICD-10-CM

## 2019-03-27 NOTE — Progress Notes (Deleted)
   CC: ***  HPI:  Ms.Jilliana Sashia Campas is a 73 y.o.  with a PMH listed below presenting for ***   Please see A&P for status of the patient's chronic medical conditions  Past Medical History:  Diagnosis Date  . Atypical nevus   . Breast cancer ([place]) 07/14/06   Left breast. Ductal carcinoma, grade 3. Followed by Dr. Truddie Coco. s/p lumpectomy with radiation  . Dental caries   . Diabetes mellitus   . DVT (deep vein thrombosis) in pregnancy 1982   Affected the RLE. Required coumadin.  . Fibroids   . GERD (gastroesophageal reflux disease)   . Goiter    U/S 08/2009-persistent multiple small nodules, one in the right lower pole slightly enlarged, recommend continued followup.  Marland Kitchen History of mitral valve prolapse   . Hyperlipidemia   . Hypertension   . Ovarian cyst   . Schizophrenia ([place])    Review of Systems: Refer to history of present illness and assessment and plans for pertinent review of systems, all others reviewed and negative.  Physical Exam:  There were no vitals filed for this visit. *** Physical Exam  Social History   Socioeconomic History  . Marital status: Widowed    Spouse name: Not on file  . Number of children: Not on file  . Years of education: Not on file  . Highest education level: Not on file  Occupational History  . Not on file  Social Needs  . Financial resource strain: Not on file  . Food insecurity    Worry: Not on file    Inability: Not on file  . Transportation needs    Medical: Not on file    Non-medical: Not on file  Tobacco Use  . Smoking status: Former Research scientist (life sciences)  . Smokeless tobacco: Never Used  . Tobacco comment: quit 9 yrs ago.  Substance and Sexual Activity  . Alcohol use: No    Alcohol/week: 0.0 standard drinks  . Drug use: No  . Sexual activity: Not on file  Lifestyle  . Physical activity    Days per week: Not on file    Minutes per session: Not on file  . Stress: Not on file  Relationships  . Social Herbalist on phone:  Not on file    Gets together: Not on file    Attends religious service: Not on file    Active member of club or organization: Not on file    Attends meetings of clubs or organizations: Not on file    Relationship status: Not on file  . Intimate partner violence    Fear of current or ex partner: Not on file    Emotionally abused: Not on file    Physically abused: Not on file    Forced sexual activity: Not on file  Other Topics Concern  . Not on file  Social History Narrative   Widow.   Used to work as a Quarry manager, now retired.         *** Family History  Problem Relation Age of Onset  . Diabetes Mother   . Diabetes Father   . Breast cancer Neg Hx     Assessment & Plan:   See Encounters Tab for problem based charting.  Patient {GC/GE:3044014::"discussed with","seen with"} Dr. {NAMES:3044014::"Butcher","Granfortuna","E. Hoffman","Klima","Mullen","Narendra","Raines","Vincent"}

## 2019-03-29 ENCOUNTER — Encounter: Payer: Medicare Other | Admitting: Internal Medicine

## 2019-03-29 ENCOUNTER — Other Ambulatory Visit: Payer: Medicare Other

## 2019-03-30 ENCOUNTER — Telehealth: Payer: Self-pay

## 2019-03-31 ENCOUNTER — Other Ambulatory Visit: Payer: Self-pay | Admitting: *Deleted

## 2019-03-31 ENCOUNTER — Other Ambulatory Visit: Payer: Self-pay | Admitting: Internal Medicine

## 2019-03-31 DIAGNOSIS — Z79891 Long term (current) use of opiate analgesic: Secondary | ICD-10-CM

## 2019-03-31 DIAGNOSIS — K219 Gastro-esophageal reflux disease without esophagitis: Secondary | ICD-10-CM

## 2019-03-31 NOTE — Telephone Encounter (Signed)
Needs refill on ranitidine (ZANTAC) 150 MG tablet diclofenac sodium (VOLTAREN) 1 % GEL ;pt contact [place], [place]

## 2019-03-31 NOTE — Telephone Encounter (Signed)
Sent request in new encounter 

## 2019-04-05 MED ORDER — PANTOPRAZOLE SODIUM 20 MG PO TBEC: 20.0000 mg | DELAYED_RELEASE_TABLET | Freq: Every day | ORAL | 2 refills | Status: DC

## 2019-04-05 MED ORDER — DICLOFENAC SODIUM 1 % TD GEL: 4.0000 g | Freq: Four times a day (QID) | TRANSDERMAL | 1 refills | Status: DC

## 2019-04-06 ENCOUNTER — Other Ambulatory Visit: Payer: Self-pay | Admitting: Internal Medicine

## 2019-04-10 ENCOUNTER — Other Ambulatory Visit: Payer: Self-pay | Admitting: Internal Medicine

## 2019-04-10 DIAGNOSIS — IMO0001 Reserved for inherently not codable concepts without codable children: Secondary | ICD-10-CM

## 2019-04-17 ENCOUNTER — Encounter: Payer: Medicare Other | Admitting: Internal Medicine

## 2019-04-17 ENCOUNTER — Other Ambulatory Visit: Payer: Medicare Other

## 2019-04-17 NOTE — Addendum Note (Signed)
Addended by: Forde Dandy on: 04/17/2019 09:08 AM   Modules accepted: Orders

## 2019-04-20 ENCOUNTER — Telehealth: Payer: Self-pay

## 2019-04-24 ENCOUNTER — Ambulatory Visit (INDEPENDENT_AMBULATORY_CARE_PROVIDER_SITE_OTHER): Payer: Medicare Other | Admitting: Internal Medicine

## 2019-04-24 ENCOUNTER — Other Ambulatory Visit: Payer: Self-pay

## 2019-04-24 ENCOUNTER — Encounter: Payer: Self-pay | Admitting: Internal Medicine

## 2019-04-24 VITALS — BP 116/66 | HR 80 | Temp 98.8°F | Wt 207.3 lb

## 2019-04-24 DIAGNOSIS — I10 Essential (primary) hypertension: Secondary | ICD-10-CM

## 2019-04-24 DIAGNOSIS — IMO0001 Reserved for inherently not codable concepts without codable children: Secondary | ICD-10-CM

## 2019-04-24 DIAGNOSIS — E1165 Type 2 diabetes mellitus with hyperglycemia: Secondary | ICD-10-CM

## 2019-04-24 DIAGNOSIS — K219 Gastro-esophageal reflux disease without esophagitis: Secondary | ICD-10-CM | POA: Diagnosis not present

## 2019-04-24 DIAGNOSIS — Z794 Long term (current) use of insulin: Secondary | ICD-10-CM

## 2019-04-24 DIAGNOSIS — Z79891 Long term (current) use of opiate analgesic: Secondary | ICD-10-CM | POA: Diagnosis not present

## 2019-04-24 LAB — POCT GLYCOSYLATED HEMOGLOBIN (HGB A1C): Hemoglobin A1C: 6.2 % — AB (ref 4.0–5.6)

## 2019-04-24 LAB — GLUCOSE, CAPILLARY: Glucose-Capillary: 109 mg/dL — ABNORMAL HIGH (ref 70–99)

## 2019-04-24 MED ORDER — TRAMADOL HCL 50 MG PO TABS: ORAL_TABLET | ORAL | 0 refills | Status: DC

## 2019-04-24 MED ORDER — POTASSIUM CHLORIDE ER 10 MEQ PO TBCR: 10.0000 meq | EXTENDED_RELEASE_TABLET | Freq: Every day | ORAL | 1 refills | Status: DC

## 2019-04-24 MED ORDER — LISINOPRIL 40 MG PO TABS: ORAL_TABLET | ORAL | 0 refills | Status: DC

## 2019-04-24 MED ORDER — METFORMIN HCL 1000 MG PO TABS: 1000.0000 mg | ORAL_TABLET | Freq: Two times a day (BID) | ORAL | 0 refills | Status: DC

## 2019-04-24 MED ORDER — PRAVASTATIN SODIUM 40 MG PO TABS: 40.0000 mg | ORAL_TABLET | Freq: Every day | ORAL | 1 refills | Status: DC

## 2019-04-24 MED ORDER — HYDROCHLOROTHIAZIDE 25 MG PO TABS: 25.0000 mg | ORAL_TABLET | Freq: Every day | ORAL | 0 refills | Status: DC

## 2019-04-24 NOTE — Patient Instructions (Signed)
Ms. [name],  It was a pleasure to see you today. Thank you for coming in.   Today we discussed your diabetes, hypertension, and chronic pain. In regards to this I have refilled your medications.   Your blood pressure looks great today! Keep up the good work!   In regards to your diabetes, your blood sugars have been doing good. Your A1c is 6.2 today! Keep up the good work!  Continue taking the Metformin, Jardiance, and the Levemir 35 units daily.   Please return to clinic in 3 months or sooner if needed.   Thank you again for coming in.   Asencion Noble.D.

## 2019-04-24 NOTE — Progress Notes (Signed)
CC: HTN, DM, schizophrenia, and chronic pain  HPI:  [name] is a 73 y.o.  with a PMH listed below presenting for HTN, DM, schizophrenia, and chronic pain.   Please see A&P for status of the patient's chronic medical conditions  Past Medical History:  Diagnosis Date  . Atypical nevus   . Breast cancer ([place]) 07/14/06   Left breast. Ductal carcinoma, grade 3. Followed by Dr. Truddie Coco. s/p lumpectomy with radiation  . Dental caries   . Diabetes mellitus   . DVT (deep vein thrombosis) in pregnancy 1982   Affected the RLE. Required coumadin.  . Fibroids   . GERD (gastroesophageal reflux disease)   . Goiter    U/S 08/2009-persistent multiple small nodules, one in the right lower pole slightly enlarged, recommend continued followup.  Marland Kitchen History of mitral valve prolapse   . Hyperlipidemia   . Hypertension   . Ovarian cyst   . Schizophrenia ([place])    Review of Systems: Refer to history of present illness and assessment and plans for pertinent review of systems, all others reviewed and negative.  Physical Exam:  Vitals:   04/24/19 1038  BP: 116/66  Pulse: 80  Temp: 98.8 F (37.1 C)  TempSrc: Oral  SpO2: 99%  Weight: 207 lb 4.8 oz (94 kg)   Physical Exam  Constitutional: She is oriented to person, place, and time and well-developed, well-nourished, and in no distress.  Cardiovascular: Normal rate, regular rhythm and normal heart sounds.  Pulmonary/Chest: Effort normal and breath sounds normal. No respiratory distress.  Abdominal: Soft. Bowel sounds are normal. She exhibits no distension.  Musculoskeletal: Normal range of motion.        General: Tenderness (mild TTP over bilateral calves and substernal chest wall) present. No edema.  Neurological: She is alert and oriented to person, place, and time.  Skin: Skin is warm and dry.  Psychiatric: Mood and affect normal.    Social History   Socioeconomic History  . Marital status: Widowed    Spouse name: Not on file  .  Number of children: Not on file  . Years of education: Not on file  . Highest education level: Not on file  Occupational History  . Not on file  Social Needs  . Financial resource strain: Not on file  . Food insecurity    Worry: Not on file    Inability: Not on file  . Transportation needs    Medical: Not on file    Non-medical: Not on file  Tobacco Use  . Smoking status: Former Research scientist (life sciences)  . Smokeless tobacco: Never Used  . Tobacco comment: quit 9 yrs ago.  Substance and Sexual Activity  . Alcohol use: No    Alcohol/week: 0.0 standard drinks  . Drug use: No  . Sexual activity: Not on file  Lifestyle  . Physical activity    Days per week: Not on file    Minutes per session: Not on file  . Stress: Not on file  Relationships  . Social Herbalist on phone: Not on file    Gets together: Not on file    Attends religious service: Not on file    Active member of club or organization: Not on file    Attends meetings of clubs or organizations: Not on file    Relationship status: Not on file  . Intimate partner violence    Fear of current or ex partner: Not on file    Emotionally abused: Not  on file    Physically abused: Not on file    Forced sexual activity: Not on file  Other Topics Concern  . Not on file  Social History Narrative   Widow.   Used to work as a Quarry manager, now retired.          Family History  Problem Relation Age of Onset  . Diabetes Mother   . Diabetes Father   . Breast cancer Neg Hx     Assessment & Plan:   See Encounters Tab for problem based charting.  Patient discussed with Dr. Dareen Piano

## 2019-04-25 LAB — BMP8+ANION GAP
Anion Gap: 20 mmol/L — ABNORMAL HIGH (ref 10.0–18.0)
BUN/Creatinine Ratio: 20 (ref 12–28)
BUN: 17 mg/dL (ref 8–27)
CO2: 20 mmol/L (ref 20–29)
Calcium: 10.2 mg/dL (ref 8.7–10.3)
Chloride: 98 mmol/L (ref 96–106)
Creatinine, Ser: 0.83 mg/dL (ref 0.57–1.00)
GFR calc Af Amer: 82 mL/min/{1.73_m2} (ref >59)
GFR calc non Af Amer: 71 mL/min/{1.73_m2} (ref >59)
Glucose: 94 mg/dL (ref 65–99)
Potassium: 4.1 mmol/L (ref 3.5–5.2)
Sodium: 138 mmol/L (ref 134–144)

## 2019-04-25 NOTE — Assessment & Plan Note (Addendum)
Patient is currently on metformin 1000 mg twice daily, Levemir 35 units daily, and had been switched from Januvia to Jardiance 10 mg daily.  She reports that her blood sugars have been well controlled on this current regimen, evaluation of the glucometer showed numbers ranging from 101-134, had some elevated readings of 236 and 190.  She denies any episodes of hypoglycemia.  Checked A1c today and it was 6.2.  Seems to be well-controlled on her current regimen.  Plan: -Continue metformin 1000 mg twice daily - Continue Jardiance 10 mg daily -Continue Levemir 35 units daily - Microalbumin/Cr ratio -Continue to check CBGs 3-4 times per day -RTC in 3 months to repeat A1c

## 2019-04-25 NOTE — Assessment & Plan Note (Signed)
Patient has a history of atypical chest pain, as well as bilateral leg pain and lower back pain.  She has been on tramadol chronically for this and takes tramadol 50 mg twice daily as needed, she reports that she will often take it only once a day but sometimes needed up to twice a day.  She reports that she ran out of her medications about 2 to 3 days ago and is having some worsening pain.  We have a pain contract on file.  Review of PDMP review showed that she was refilling this appropriately.   -Refill tramadol 50 mg BID PRN #60 tabs -Obtain UDS

## 2019-04-25 NOTE — Assessment & Plan Note (Signed)
Patient had been on ranitidine in the past however this had been recalled by the FDA.  Sent in prescription for pantoprazole 20 mg daily which she has been taking, she reports that this has been working well for her.    Plan: -Continue pantoprazole 20 mg daily

## 2019-04-25 NOTE — Assessment & Plan Note (Addendum)
Patient is currently on metoprolol 150 mg twice daily, hydrochlorothiazide 25 mg daily, and lisinopril 40 mg daily. She is also on K-dur 10 mg daily. She reports no issues taking her medications, denies any lightheadedness, dizziness, headache, fatigue, chest pain, shortness of breath or other symptoms.  Last BMP on 12/2017 was WNL.  Blood pressure today well controlled at 116/66.  Plan: -Continue metoprolol 150 mg twice daily -Continue hydrochlorothiazide 25 mg daily -Continue lisinopril 40 mg daily -Obtain BMP

## 2019-04-26 LAB — MICROALBUMIN / CREATININE URINE RATIO
Creatinine, Urine: 95.3 mg/dL
Microalb/Creat Ratio: 7 mg/g creat (ref 0–29)
Microalbumin, Urine: 6.3 ug/mL

## 2019-04-27 LAB — TOXASSURE SELECT,+ANTIDEPR,UR

## 2019-04-27 NOTE — Progress Notes (Signed)
Internal Medicine Clinic Attending  Case discussed with Dr. Krienke at the time of the visit.  We reviewed the resident's history and exam and pertinent patient test results.  I agree with the assessment, diagnosis, and plan of care documented in the resident's note.    

## 2019-04-28 ENCOUNTER — Ambulatory Visit
Admission: RE | Admit: 2019-04-28 | Discharge: 2019-04-28 | Disposition: A | Discharge status: Home / Self Care | Payer: Medicare Other | Source: Ambulatory Visit | Attending: *Deleted | Admitting: *Deleted

## 2019-04-28 ENCOUNTER — Other Ambulatory Visit: Payer: Self-pay

## 2019-04-28 DIAGNOSIS — Z1231 Encounter for screening mammogram for malignant neoplasm of breast: Secondary | ICD-10-CM

## 2019-04-28 HISTORY — DX: Personal history of irradiation: Z92.3

## 2019-04-28 NOTE — Progress Notes (Signed)
Called patient to follow up DM medication management after switch from Januvia to [place]. Pt appears to have tolerated the transition well and labs on 8/17 WNL.    Signing off of case due to being unable to reach x 4, left message to call back.

## 2019-04-28 NOTE — Telephone Encounter (Signed)
Unable to reach patient for follow up DM med management. Left message to call ack

## 2019-05-01 ENCOUNTER — Other Ambulatory Visit: Payer: Self-pay | Admitting: Internal Medicine

## 2019-05-01 DIAGNOSIS — R928 Other abnormal and inconclusive findings on diagnostic imaging of breast: Secondary | ICD-10-CM

## 2019-05-16 ENCOUNTER — Other Ambulatory Visit: Payer: Self-pay | Admitting: *Deleted

## 2019-05-16 ENCOUNTER — Other Ambulatory Visit: Payer: Self-pay

## 2019-05-16 ENCOUNTER — Ambulatory Visit
Admission: RE | Admit: 2019-05-16 | Discharge: 2019-05-16 | Disposition: A | Discharge status: Home / Self Care | Payer: Medicare Other | Source: Ambulatory Visit | Attending: *Deleted | Admitting: *Deleted

## 2019-05-16 DIAGNOSIS — R922 Inconclusive mammogram: Secondary | ICD-10-CM | POA: Diagnosis not present

## 2019-05-16 DIAGNOSIS — R928 Other abnormal and inconclusive findings on diagnostic imaging of breast: Secondary | ICD-10-CM

## 2019-05-16 DIAGNOSIS — IMO0001 Reserved for inherently not codable concepts without codable children: Secondary | ICD-10-CM

## 2019-05-16 MED ORDER — LEVEMIR FLEXTOUCH 100 UNIT/ML ~~LOC~~ SOPN: PEN_INJECTOR | SUBCUTANEOUS | 3 refills | Status: DC

## 2019-05-24 ENCOUNTER — Other Ambulatory Visit: Payer: Self-pay | Admitting: Internal Medicine

## 2019-05-24 DIAGNOSIS — Z79891 Long term (current) use of opiate analgesic: Secondary | ICD-10-CM

## 2019-06-27 ENCOUNTER — Other Ambulatory Visit: Payer: Self-pay | Admitting: Internal Medicine

## 2019-06-27 DIAGNOSIS — Z79891 Long term (current) use of opiate analgesic: Secondary | ICD-10-CM

## 2019-06-27 NOTE — Telephone Encounter (Signed)
Need refills on traMADol (ULTRAM) 50 MG tablet  ;pt contact [place], [place]

## 2019-06-28 ENCOUNTER — Other Ambulatory Visit: Payer: Self-pay | Admitting: Internal Medicine

## 2019-06-28 DIAGNOSIS — Z79891 Long term (current) use of opiate analgesic: Secondary | ICD-10-CM

## 2019-06-28 MED ORDER — TRAMADOL HCL 50 MG PO TABS: 50.0000 mg | ORAL_TABLET | Freq: Two times a day (BID) | ORAL | 0 refills | Status: DC | PRN

## 2019-06-28 NOTE — Telephone Encounter (Signed)
Thanks - it went electronically.

## 2019-07-18 ENCOUNTER — Other Ambulatory Visit: Payer: Self-pay | Admitting: Internal Medicine

## 2019-07-18 DIAGNOSIS — K219 Gastro-esophageal reflux disease without esophagitis: Secondary | ICD-10-CM

## 2019-08-01 ENCOUNTER — Other Ambulatory Visit: Payer: Self-pay | Admitting: Internal Medicine

## 2019-08-01 DIAGNOSIS — Z79891 Long term (current) use of opiate analgesic: Secondary | ICD-10-CM

## 2019-08-01 DIAGNOSIS — I10 Essential (primary) hypertension: Secondary | ICD-10-CM

## 2019-08-01 DIAGNOSIS — E1165 Type 2 diabetes mellitus with hyperglycemia: Secondary | ICD-10-CM

## 2019-08-01 MED ORDER — LISINOPRIL 40 MG PO TABS: ORAL_TABLET | ORAL | 1 refills | Status: DC

## 2019-08-01 MED ORDER — TRAMADOL HCL 50 MG PO TABS: 50.0000 mg | ORAL_TABLET | Freq: Two times a day (BID) | ORAL | 0 refills | Status: DC | PRN

## 2019-08-01 MED ORDER — METFORMIN HCL 1000 MG PO TABS: 1000.0000 mg | ORAL_TABLET | Freq: Two times a day (BID) | ORAL | 1 refills | Status: DC

## 2019-08-01 NOTE — Telephone Encounter (Signed)
Refill Request  traMADol (ULTRAM) 50 MG tablet  metFORMIN (GLUCOPHAGE) 1000 MG tabletmetFORMIN (GLUCOPHAGE) 1000 MG tablet  lisinopril (ZESTRIL) 40 MG tablet  [id number] - [place], [place]

## 2019-08-07 ENCOUNTER — Other Ambulatory Visit: Payer: Self-pay | Admitting: Internal Medicine

## 2019-08-07 DIAGNOSIS — I10 Essential (primary) hypertension: Secondary | ICD-10-CM

## 2019-09-05 ENCOUNTER — Other Ambulatory Visit: Payer: Self-pay | Admitting: Internal Medicine

## 2019-09-05 DIAGNOSIS — I10 Essential (primary) hypertension: Secondary | ICD-10-CM

## 2019-09-05 DIAGNOSIS — Z79891 Long term (current) use of opiate analgesic: Secondary | ICD-10-CM

## 2019-10-07 ENCOUNTER — Other Ambulatory Visit: Payer: Self-pay | Admitting: Internal Medicine

## 2019-10-07 ENCOUNTER — Other Ambulatory Visit: Payer: Self-pay | Admitting: Student in an Organized Health Care Education/Training Program

## 2019-10-07 DIAGNOSIS — Z79891 Long term (current) use of opiate analgesic: Secondary | ICD-10-CM

## 2019-10-07 DIAGNOSIS — I10 Essential (primary) hypertension: Secondary | ICD-10-CM

## 2019-10-09 NOTE — Telephone Encounter (Signed)
Spoke with the patient caregiver Freda Munro daughter).  APpt has been sch for 10/30/2019 @ 2:15 pm.

## 2019-10-30 ENCOUNTER — Encounter: Payer: Medicare Other | Admitting: Internal Medicine

## 2019-10-30 NOTE — Progress Notes (Deleted)
   CC: ***  HPI:  [name] is a 74 y.o.  with a PMH listed below presenting for ***     Please see A&P for status of the patient's chronic medical conditions  Past Medical History:  Diagnosis Date  . Atypical nevus   . Breast cancer ([place]) 07/14/06   Left breast. Ductal carcinoma, grade 3. Followed by Dr. Truddie Coco. s/p lumpectomy with radiation  . Dental caries   . Diabetes mellitus   . DVT (deep vein thrombosis) in pregnancy 1982   Affected the RLE. Required coumadin.  . Fibroids   . GERD (gastroesophageal reflux disease)   . Goiter    U/S 08/2009-persistent multiple small nodules, one in the right lower pole slightly enlarged, recommend continued followup.  Marland Kitchen History of mitral valve prolapse   . Hyperlipidemia   . Hypertension   . Ovarian cyst   . Personal history of radiation therapy   . Schizophrenia ([place])    Review of Systems: Refer to history of present illness and assessment and plans for pertinent review of systems, all others reviewed and negative.  Physical Exam:  There were no vitals filed for this visit. *** Physical Exam  Social History   Socioeconomic History  . Marital status: Widowed    Spouse name: Not on file  . Number of children: Not on file  . Years of education: Not on file  . Highest education level: Not on file  Occupational History  . Not on file  Tobacco Use  . Smoking status: Former Research scientist (life sciences)  . Smokeless tobacco: Never Used  . Tobacco comment: quit 9 yrs ago.  Substance and Sexual Activity  . Alcohol use: No    Alcohol/week: 0.0 standard drinks  . Drug use: No  . Sexual activity: Not on file  Other Topics Concern  . Not on file  Social History Narrative   Widow.   Used to work as a Quarry manager, now retired.         Social Determinants of Health   Financial Resource Strain:   . Difficulty of Paying Living Expenses: Not on file  Food Insecurity:   . Worried About Charity fundraiser in the Last Year: Not on file  . Ran Out of  Food in the Last Year: Not on file  Transportation Needs:   . Lack of Transportation (Medical): Not on file  . Lack of Transportation (Non-Medical): Not on file  Physical Activity:   . Days of Exercise per Week: Not on file  . Minutes of Exercise per Session: Not on file  Stress:   . Feeling of Stress : Not on file  Social Connections:   . Frequency of Communication with Friends and Family: Not on file  . Frequency of Social Gatherings with Friends and Family: Not on file  . Attends Religious Services: Not on file  . Active Member of Clubs or Organizations: Not on file  . Attends Archivist Meetings: Not on file  . Marital Status: Not on file  Intimate Partner Violence:   . Fear of Current or Ex-Partner: Not on file  . Emotionally Abused: Not on file  . Physically Abused: Not on file  . Sexually Abused: Not on file   *** Family History  Problem Relation Age of Onset  . Diabetes Mother   . Diabetes Father     Assessment & Plan:   See Encounters Tab for problem based charting.  Patient {GC/GE:3044014::"discussed with","seen with"} Dr. {NAMES:3044014::"Butcher","Granfortuna","E. Hoffman","Klima","Mullen","Narendra","Raines","Vincent"}

## 2019-11-07 ENCOUNTER — Other Ambulatory Visit: Payer: Self-pay | Admitting: Student in an Organized Health Care Education/Training Program

## 2019-11-07 DIAGNOSIS — I10 Essential (primary) hypertension: Secondary | ICD-10-CM

## 2019-11-08 NOTE — Telephone Encounter (Signed)
Next appt scheduled 11/14/19 with PCP.

## 2019-11-09 DIAGNOSIS — F209 Schizophrenia, unspecified: Secondary | ICD-10-CM | POA: Diagnosis not present

## 2019-11-13 ENCOUNTER — Encounter: Payer: Self-pay | Admitting: *Deleted

## 2019-11-13 NOTE — Progress Notes (Unsigned)

## 2019-11-14 ENCOUNTER — Encounter: Payer: Medicare Other | Admitting: Internal Medicine

## 2019-11-15 ENCOUNTER — Telehealth: Payer: Self-pay | Admitting: Oncology

## 2019-11-15 NOTE — Progress Notes (Unsigned)
Things That May Be Affecting Your Health:  Alcohol  Hearing loss  Pain    Depression  Home Safety  Sexual Health  x Diabetes  Lack of physical activity  Stress   Difficulty with daily activities  Loneliness  Tiredness   Drug use x Medicines x Tobacco use   Falls  Motor Vehicle Safety  Weight   Food choices  Oral Health  Other    YOUR PERSONALIZED HEALTH PLAN : 1. Schedule your next subsequent Medicare Wellness visit in one year 2. Attend all of your regular appointments to address your medical issues 3. Complete the preventative screenings and services   Annual Wellness Visit   Medicare Covered Preventative Screenings and [place] Men and Women Who How Often Need? Date of Last Service Action  Abdominal Aortic Aneurysm Adults with AAA risk factors Once     Alcohol Misuse and Counseling All Adults Screening once a year if no alcohol misuse. Counseling up to 4 face to face sessions.     Bone Density Measurement  Adults at risk for osteoporosis Once every 2 yrs     Lipid Panel Z13.6 All adults without CV disease Once every 5 yrs     Colorectal Cancer   Stool sample or  Colonoscopy All adults 68 and older   Once every year  Every 10 years Yes    Depression All Adults Once a year  Today   Diabetes Screening Blood glucose, post glucose load, or GTT Z13.1  All adults at risk  Pre-diabetics  Once per year  Twice per year Yes  Check A1c and foot exam  Diabetes  Self-Management Training All adults Diabetics 10 hrs first year; 2 hours subsequent years. Requires Copay     Glaucoma  Diabetics  Family history of glaucoma  African Americans 61 yrs +  Hispanic Americans 5 yrs + Annually - requires coppay     Hepatitis C Z72.89 or F19.20  High Risk for HCV  Born between 1945 and 1965  Annually  Once     HIV Z11.4 All adults based on risk  Annually btw ages 60 & 52 regardless of risk  Annually > 65 yrs if at increased risk     Lung Cancer  Screening Asymptomatic adults aged 80-77 with 30 pack yr history and current smoker OR quit within the last 15 yrs Annually Must have counseling and shared decision making documentation before first screen Yes    Medical Nutrition Therapy Adults with   Diabetes  Renal disease  Kidney transplant within past 3 yrs 3 hours first year; 2 hours subsequent years     Obesity and Counseling All adults Screening once a year Counseling if BMI 30 or higher  Today   Tobacco Use Counseling Adults who use tobacco  Up to 8 visits in one year     Vaccines Z23  Hepatitis B  Influenza   Pneumonia  Adults   Once  Once every flu season  Two different vaccines separated by one year     Next Annual Wellness Visit People with Medicare Every year  Today     Services & Screenings Women Who How Often Need  Date of Last Service Action  Mammogram  Z12.31 Women over 16 One baseline ages 78-39. Annually ager 40 yrs+     Pap tests All women Annually if high risk. Every 2 yrs for normal risk women     Screening for cervical cancer with   Pap (Z01.419 nl  or Z01.411abnl) &  HPV Z11.51 Women aged 24 to 88 Once every 5 yrs     Screening pelvic and breast exams All women Annually if high risk. Every 2 yrs for normal risk women     Sexually Transmitted Diseases  Chlamydia  Gonorrhea  Syphilis All at risk adults Annually for non pregnant females at increased risk         [place] Men Who How Ofter Need  Date of Last Service Action  Prostate Cancer - DRE & PSA Men over 50 Annually.  DRE might require a copay.     Sexually Transmitted Diseases  Syphilis All at risk adults Annually for men at increased risk

## 2019-11-15 NOTE — Telephone Encounter (Signed)
Faxed records to [facility], [place] [phone]

## 2019-11-20 ENCOUNTER — Other Ambulatory Visit: Payer: Self-pay

## 2019-11-20 ENCOUNTER — Encounter: Payer: Self-pay | Admitting: Internal Medicine

## 2019-11-20 ENCOUNTER — Ambulatory Visit (INDEPENDENT_AMBULATORY_CARE_PROVIDER_SITE_OTHER): Payer: Medicare Other | Admitting: Internal Medicine

## 2019-11-20 VITALS — BP 124/59 | HR 68 | Temp 98.2°F | Ht 70.0 in | Wt 211.9 lb

## 2019-11-20 DIAGNOSIS — I10 Essential (primary) hypertension: Secondary | ICD-10-CM | POA: Diagnosis not present

## 2019-11-20 DIAGNOSIS — M79605 Pain in left leg: Secondary | ICD-10-CM

## 2019-11-20 DIAGNOSIS — Z87891 Personal history of nicotine dependence: Secondary | ICD-10-CM | POA: Diagnosis not present

## 2019-11-20 DIAGNOSIS — M545 Low back pain: Secondary | ICD-10-CM

## 2019-11-20 DIAGNOSIS — E1165 Type 2 diabetes mellitus with hyperglycemia: Secondary | ICD-10-CM

## 2019-11-20 DIAGNOSIS — Z79891 Long term (current) use of opiate analgesic: Secondary | ICD-10-CM | POA: Diagnosis not present

## 2019-11-20 DIAGNOSIS — G8929 Other chronic pain: Secondary | ICD-10-CM

## 2019-11-20 DIAGNOSIS — R0789 Other chest pain: Secondary | ICD-10-CM | POA: Diagnosis not present

## 2019-11-20 DIAGNOSIS — Z794 Long term (current) use of insulin: Secondary | ICD-10-CM

## 2019-11-20 DIAGNOSIS — M79604 Pain in right leg: Secondary | ICD-10-CM | POA: Diagnosis not present

## 2019-11-20 DIAGNOSIS — F209 Schizophrenia, unspecified: Secondary | ICD-10-CM

## 2019-11-20 DIAGNOSIS — Z0000 Encounter for general adult medical examination without abnormal findings: Secondary | ICD-10-CM

## 2019-11-20 DIAGNOSIS — Z7982 Long term (current) use of aspirin: Secondary | ICD-10-CM

## 2019-11-20 DIAGNOSIS — Z79899 Other long term (current) drug therapy: Secondary | ICD-10-CM | POA: Diagnosis not present

## 2019-11-20 DIAGNOSIS — E118 Type 2 diabetes mellitus with unspecified complications: Secondary | ICD-10-CM | POA: Diagnosis not present

## 2019-11-20 LAB — POCT GLYCOSYLATED HEMOGLOBIN (HGB A1C): Hemoglobin A1C: 6.2 % — AB (ref 4.0–5.6)

## 2019-11-20 LAB — GLUCOSE, CAPILLARY: Glucose-Capillary: 98 mg/dL (ref 70–99)

## 2019-11-20 MED ORDER — LEVEMIR FLEXTOUCH 100 UNIT/ML ~~LOC~~ SOPN: PEN_INJECTOR | SUBCUTANEOUS | 3 refills | Status: DC

## 2019-11-20 MED ORDER — TRAMADOL HCL 50 MG PO TABS: 50.0000 mg | ORAL_TABLET | Freq: Two times a day (BID) | ORAL | 0 refills | Status: DC | PRN

## 2019-11-20 NOTE — Assessment & Plan Note (Signed)
Patient declined Pna and influenza vaccine. She was agreeable to the FIT in lieu of colonoscopy.

## 2019-11-20 NOTE — Assessment & Plan Note (Signed)
Patient has a history of atypical chest pain that she is taking tramadol 50 mg q12 hours PRN. She also has bilateral leg pain and lower back pain. She reports that she ran out a few days ago and she is starting to have chest pain again. Pain contract on file. PDMP reviewed and her refills are appropriate, only prescribers are from [place] was on 04/2019 and it was appropriate.   -Refill tramadol 50 mg q12 hr #60 -Consider UDS on next visit

## 2019-11-20 NOTE — Assessment & Plan Note (Signed)
Patient is currently taking Levemir 35 units daily, Jardiance 10 mg daily, and metformin 500 mg BID. Daughter checks her blood sugars about once per day, during various times of the day. They report its normally around 100. A1c today is 6.2. They deny any hypoglycemic events. Will be able to wean down on her medications since this much lower then her goal. Given her other co-morbidities, including schizophrenia, and age her goal A1c is <8.5.   -Decrease Levemir to 25 units daily -Continue Jardiance 10 mg daily -Continue metformin 1000 mg BID -F/u in 3 m, can continue decreasing Levemir if A1c and CBGs remain low

## 2019-11-20 NOTE — Progress Notes (Signed)
CC: Diabetes, HTN, and atypical chest pain  HPI:  [name] is a 74 y.o.  with a PMH listed below presenting for diabetes, HTN, and atypical chest pain.  Please see A&P for status of the patient's chronic medical conditions  Past Medical History:  Diagnosis Date  . Atypical nevus   . Breast cancer ([place]) 07/14/06   Left breast. Ductal carcinoma, grade 3. Followed by Dr. Truddie Coco. s/p lumpectomy with radiation  . Dental caries   . Diabetes mellitus   . DVT (deep vein thrombosis) in pregnancy 1982   Affected the RLE. Required coumadin.  . Fibroids   . GERD (gastroesophageal reflux disease)   . Goiter    U/S 08/2009-persistent multiple small nodules, one in the right lower pole slightly enlarged, recommend continued followup.  Marland Kitchen History of mitral valve prolapse   . Hyperlipidemia   . Hypertension   . Ovarian cyst   . Personal history of radiation therapy   . Schizophrenia ([place])    Review of Systems: Refer to history of present illness and assessment and plans for pertinent review of systems, all others reviewed and negative.  Physical Exam:  Vitals:   11/20/19 1401  BP: (!) 124/59  Pulse: 68  Temp: 98.2 F (36.8 C)  TempSrc: Oral  SpO2: 99%  Weight: 211 lb 14.4 oz (96.1 kg)  Height: 5\' 10"  (1.778 m)   Physical Exam  Constitutional: She is well-developed, well-nourished, and in no distress.  HENT:  Head: Normocephalic and atraumatic.  Cardiovascular: Normal rate, regular rhythm and normal heart sounds.  Pulmonary/Chest: Effort normal and breath sounds normal. No respiratory distress.  Abdominal: Soft. Bowel sounds are normal. She exhibits no distension.  Musculoskeletal:        General: No edema. Normal range of motion.     Cervical back: Normal range of motion and neck supple.  Neurological: She is alert.  Skin: Skin is warm and dry.  Psychiatric: Mood and affect normal.    Social History   Socioeconomic History  . Marital status: Widowed    Spouse  name: Not on file  . Number of children: Not on file  . Years of education: Not on file  . Highest education level: Not on file  Occupational History  . Not on file  Tobacco Use  . Smoking status: Former Research scientist (life sciences)  . Smokeless tobacco: Never Used  . Tobacco comment: quit 9 yrs ago.  Substance and Sexual Activity  . Alcohol use: No    Alcohol/week: 0.0 standard drinks  . Drug use: No  . Sexual activity: Not on file  Other Topics Concern  . Not on file  Social History Narrative   Widow.   Used to work as a Quarry manager, now retired.         Social Determinants of Health   Financial Resource Strain:   . Difficulty of Paying Living Expenses:   Food Insecurity:   . Worried About Charity fundraiser in the Last Year:   . Arboriculturist in the Last Year:   Transportation Needs:   . Film/video editor (Medical):   Marland Kitchen Lack of Transportation (Non-Medical):   Physical Activity:   . Days of Exercise per Week:   . Minutes of Exercise per Session:   Stress:   . Feeling of Stress :   Social Connections:   . Frequency of Communication with Friends and Family:   . Frequency of Social Gatherings with Friends and Family:   .  Attends Religious Services:   . Active Member of Clubs or Organizations:   . Attends Archivist Meetings:   Marland Kitchen Marital Status:   Intimate Partner Violence:   . Fear of Current or Ex-Partner:   . Emotionally Abused:   Marland Kitchen Physically Abused:   . Sexually Abused:    Family History  Problem Relation Age of Onset  . Diabetes Mother   . Diabetes Father     Assessment & Plan:   See Encounters Tab for problem based charting.  Patient discussed with Dr. Evette Doffing

## 2019-11-20 NOTE — Assessment & Plan Note (Signed)
Patient is currently taking metoprolol, lisinopril, and HCTZ. She denies any issues taking her medications. BP today is 124/59. Last BMP in 04/2019 was unremarkable.  -Continue current regimen

## 2019-11-20 NOTE — Patient Instructions (Signed)
Ms. Zoa Fondren,  It was a pleasure to see you today. Thank you for coming in.   Today we discussed your blood pressure. This looks great! Continue taking your current medications.   We also discussed your diabetes. Your A1c today is 6.2! This is great. We can decrease your insulin, please decrease your Levemir to 25 units daily. Start checking your blood sugars more often and contact us if the readings are consistently >180. We will follow up in about 3 months to see how you are doing.   I have sent in the prescription for your chest pain.    Please return to clinic in 3 months or sooner if needed.   Thank you again for coming in.   Asencion Noble.D.

## 2019-11-21 NOTE — Progress Notes (Signed)
Internal Medicine Clinic Attending  Case discussed with Dr. Krienke at the time of the visit.  We reviewed the resident's history and exam and pertinent patient test results.  I agree with the assessment, diagnosis, and plan of care documented in the resident's note.    

## 2019-12-06 ENCOUNTER — Other Ambulatory Visit: Payer: Self-pay

## 2019-12-06 ENCOUNTER — Ambulatory Visit: Payer: Medicare Other | Admitting: Internal Medicine

## 2019-12-12 ENCOUNTER — Ambulatory Visit: Payer: Medicare Other | Admitting: Internal Medicine

## 2019-12-12 ENCOUNTER — Other Ambulatory Visit: Payer: Self-pay

## 2019-12-19 ENCOUNTER — Other Ambulatory Visit: Payer: Self-pay | Admitting: Student in an Organized Health Care Education/Training Program

## 2019-12-19 ENCOUNTER — Other Ambulatory Visit: Payer: Self-pay | Admitting: Internal Medicine

## 2019-12-19 DIAGNOSIS — I10 Essential (primary) hypertension: Secondary | ICD-10-CM

## 2019-12-19 DIAGNOSIS — Z79891 Long term (current) use of opiate analgesic: Secondary | ICD-10-CM

## 2020-01-31 ENCOUNTER — Telehealth: Payer: Self-pay | Admitting: *Deleted

## 2020-01-31 NOTE — Telephone Encounter (Signed)
01-31-20  11:38  I called [name]'s daughter [name]  (pt's DOB verified) and spoke to her in reference to the IFOBT collected on 12/07/2019.  I explained that we did not received the specimen in a timely manner and it was too old for analysis. (IFOBT sent by mail)  [name] understood and agreed to help her mother [name] recollect the sample and bring it with them to her next appt on 02-19-2020 with Dr Sherry Ruffing.  I will mail a new IFOBT kit to her today.  Maryan Rued, [facility]

## 2020-02-07 ENCOUNTER — Other Ambulatory Visit: Payer: Self-pay | Admitting: Internal Medicine

## 2020-02-07 DIAGNOSIS — Z79891 Long term (current) use of opiate analgesic: Secondary | ICD-10-CM

## 2020-02-07 NOTE — Telephone Encounter (Signed)
Needs refill on traMADol (ULTRAM) 50 MG tablet ;pt contact [place], [place]

## 2020-02-19 ENCOUNTER — Encounter: Payer: Medicare Other | Admitting: Internal Medicine

## 2020-02-19 NOTE — Progress Notes (Deleted)
   CC: ***  HPI:  [name] is a 74 y.o.   Past Medical History:  Diagnosis Date  . Atypical nevus   . Breast cancer ([place]) 07/14/06   Left breast. Ductal carcinoma, grade 3. Followed by Dr. Truddie Coco. s/p lumpectomy with radiation  . Dental caries   . Diabetes mellitus   . DVT (deep vein thrombosis) in pregnancy 1982   Affected the RLE. Required coumadin.  . Fibroids   . GERD (gastroesophageal reflux disease)   . Goiter    U/S 08/2009-persistent multiple small nodules, one in the right lower pole slightly enlarged, recommend continued followup.  Marland Kitchen History of mitral valve prolapse   . Hyperlipidemia   . Hypertension   . Ovarian cyst   . Personal history of radiation therapy   . Schizophrenia ([place])    Review of Systems:  ***  Physical Exam:  There were no vitals filed for this visit. ***  Assessment & Plan:   See Encounters Tab for problem based charting.  Patient {GC/GE:[id number]::"discussed with","seen with"} Dr. {NAMES:[id number]::"Butcher","Guilloud","Hoffman","Mullen","Narendra","Raines","Vincent"}

## 2020-02-27 ENCOUNTER — Other Ambulatory Visit: Payer: Self-pay | Admitting: Internal Medicine

## 2020-02-27 DIAGNOSIS — I10 Essential (primary) hypertension: Secondary | ICD-10-CM

## 2020-02-27 DIAGNOSIS — K219 Gastro-esophageal reflux disease without esophagitis: Secondary | ICD-10-CM

## 2020-02-27 DIAGNOSIS — E1165 Type 2 diabetes mellitus with hyperglycemia: Secondary | ICD-10-CM

## 2020-02-28 ENCOUNTER — Other Ambulatory Visit: Payer: Self-pay | Admitting: *Deleted

## 2020-02-28 DIAGNOSIS — I10 Essential (primary) hypertension: Secondary | ICD-10-CM

## 2020-02-29 ENCOUNTER — Other Ambulatory Visit: Payer: Self-pay | Admitting: *Deleted

## 2020-02-29 DIAGNOSIS — I10 Essential (primary) hypertension: Secondary | ICD-10-CM

## 2020-03-01 MED ORDER — METOPROLOL TARTRATE 50 MG PO TABS: 150.0000 mg | ORAL_TABLET | Freq: Two times a day (BID) | ORAL | 11 refills | Status: DC

## 2020-03-01 MED ORDER — PRAVASTATIN SODIUM 40 MG PO TABS: 40.0000 mg | ORAL_TABLET | Freq: Every day | ORAL | 3 refills | Status: DC

## 2020-03-12 ENCOUNTER — Other Ambulatory Visit: Payer: Self-pay | Admitting: *Deleted

## 2020-03-12 DIAGNOSIS — I10 Essential (primary) hypertension: Secondary | ICD-10-CM

## 2020-03-12 MED ORDER — POTASSIUM CHLORIDE CRYS ER 10 MEQ PO TBCR: 10.0000 meq | EXTENDED_RELEASE_TABLET | Freq: Every day | ORAL | 0 refills | Status: DC

## 2020-03-19 ENCOUNTER — Other Ambulatory Visit: Payer: Self-pay | Admitting: Internal Medicine

## 2020-03-19 DIAGNOSIS — Z79891 Long term (current) use of opiate analgesic: Secondary | ICD-10-CM

## 2020-04-08 ENCOUNTER — Ambulatory Visit (INDEPENDENT_AMBULATORY_CARE_PROVIDER_SITE_OTHER): Payer: Medicare Other | Admitting: Internal Medicine

## 2020-04-08 ENCOUNTER — Encounter: Payer: Self-pay | Admitting: Internal Medicine

## 2020-04-08 VITALS — BP 122/56 | HR 66 | Temp 98.6°F | Wt 217.3 lb

## 2020-04-08 DIAGNOSIS — M545 Low back pain, unspecified: Secondary | ICD-10-CM

## 2020-04-08 DIAGNOSIS — M549 Dorsalgia, unspecified: Secondary | ICD-10-CM | POA: Insufficient documentation

## 2020-04-08 DIAGNOSIS — Z1211 Encounter for screening for malignant neoplasm of colon: Secondary | ICD-10-CM

## 2020-04-08 DIAGNOSIS — I10 Essential (primary) hypertension: Secondary | ICD-10-CM

## 2020-04-08 DIAGNOSIS — Z79891 Long term (current) use of opiate analgesic: Secondary | ICD-10-CM | POA: Diagnosis not present

## 2020-04-08 DIAGNOSIS — E1165 Type 2 diabetes mellitus with hyperglycemia: Secondary | ICD-10-CM | POA: Diagnosis not present

## 2020-04-08 LAB — POCT GLYCOSYLATED HEMOGLOBIN (HGB A1C): Hemoglobin A1C: 6.9 % — AB (ref 4.0–5.6)

## 2020-04-08 LAB — GLUCOSE, CAPILLARY: Glucose-Capillary: 115 mg/dL — ABNORMAL HIGH (ref 70–99)

## 2020-04-08 MED ORDER — DICLOFENAC SODIUM 1 % EX GEL: 2.0000 g | Freq: Four times a day (QID) | CUTANEOUS | 1 refills | Status: DC

## 2020-04-08 MED ORDER — TRAMADOL HCL 50 MG PO TABS: 50.0000 mg | ORAL_TABLET | Freq: Two times a day (BID) | ORAL | 0 refills | Status: DC | PRN

## 2020-04-08 NOTE — Patient Instructions (Addendum)
Ms. Elianah Karis,  It was a pleasure to see you today. Thank you for coming in.   Today we discussed your diabetes. In regards to this please continue taking your current medications. Your A1c today Is 6.9 which is great. I have referred you to the eye doctor to get an eye exam.    We also discussed your blood pressure. This looks good today, continue your current medications. I am checking some labs and will contact you if they are abnormal.   We also discussed you back pain, this seems like it's a muscle spasm. Please use ibuprofen and the Voltaren gel on that area. If your symptoms do not improve or worsening over the next 1-2 months then you should come back to be evaluated.   I have ordered a FIT test to screen you for colon cancer.   Please return to clinic in 3 months or sooner if needed.   Thank you again for coming in.   Asencion Noble.D.

## 2020-04-08 NOTE — Assessment & Plan Note (Signed)
Ordered FIT test for colon cancer screen.

## 2020-04-08 NOTE — Progress Notes (Signed)
   CC: Hypertension, diabetes, atypical chest pain, and back pain  HPI:  [name] is a 74 y.o. with the history listed below presenting for follow up of her hypertension, diabetes, atypical chest pain, and back pain.  Past Medical History:  Diagnosis Date  . Atypical nevus   . Breast cancer ([place]) 07/14/06   Left breast. Ductal carcinoma, grade 3. Followed by Dr. Truddie Coco. s/p lumpectomy with radiation  . Dental caries   . Diabetes mellitus   . DVT (deep vein thrombosis) in pregnancy 1982   Affected the RLE. Required coumadin.  . Fibroids   . GERD (gastroesophageal reflux disease)   . Goiter    U/S 08/2009-persistent multiple small nodules, one in the right lower pole slightly enlarged, recommend continued followup.  Marland Kitchen History of mitral valve prolapse   . Hyperlipidemia   . Hypertension   . Ovarian cyst   . Personal history of radiation therapy   . Schizophrenia ([place])    Review of Systems:   Constitutional: Negative for chills and fever.  Respiratory: Negative for shortness of breath.   Cardiovascular: Negative for chest pain and leg swelling.  Gastrointestinal: Negative for abdominal pain, nausea and vomiting.  Musculoskeletal: Positive for bilateral lower back pain.  Neurological: Negative for dizziness and headaches.   Physical Exam:  Vitals:   04/08/20 0952  Pulse: 66  SpO2: 99%  Weight: (!) 217 lb 4.8 oz (98.6 kg)   Physical Exam Constitutional:      Appearance: Normal appearance.  HENT:     Head: Normocephalic and atraumatic.     Mouth/Throat:     Mouth: Mucous membranes are moist.     Pharynx: Oropharynx is clear.  Cardiovascular:     Rate and Rhythm: Normal rate and regular rhythm.     Pulses: Normal pulses.     Heart sounds: Normal heart sounds.  Pulmonary:     Effort: Pulmonary effort is normal.     Breath sounds: Normal breath sounds.  Abdominal:     General: Abdomen is flat. Bowel sounds are normal.     Palpations: Abdomen is soft.   Musculoskeletal:        General: Tenderness (TTP over bilateral lower lumbosacral muscles, no point tenderness over spinous process) present. No swelling or deformity.     Right lower leg: No edema.     Left lower leg: No edema.  Skin:    General: Skin is warm and dry.  Neurological:     General: No focal deficit present.     Mental Status: She is alert and oriented to person, place, and time.  Psychiatric:        Mood and Affect: Mood normal.        Behavior: Behavior normal.     Assessment & Plan:   See Encounters Tab for problem based charting.  Patient discussed with Dr. Daryll Drown

## 2020-04-08 NOTE — Assessment & Plan Note (Signed)
Patient is currently on Levemir 25 units daily, Jardiance 10 mg daily, and Metformin 1000 mg twice daily.  A1c today is 6.9.  Denies any polyuria or nocturia, endorses mild polydipsia.  Denies any changes in her appetite, numbness, tingling, or other symptoms.  Diabetes is well controlled at this time.    Plan: -Continue Levemir 25 units daily, Jardiance 10 mg daily, and Metformin 1000 mg twice daily -Referral to ophthalmology for eye exam

## 2020-04-08 NOTE — Assessment & Plan Note (Signed)
Patient is currently on metoprolol, lisinopril, HCTZ.  Denies any issues taking these medications.  Blood pressure today is 122/56.  We will continue current regimen.  -Continue metoprolol, lisinopril, HCTZ -Check BMP today

## 2020-04-08 NOTE — Assessment & Plan Note (Signed)
Patient has a history of atypical chest pain that is currently on tramadol 50 mg every 12 as needed.  Reports that this is working well for her.  We do have an pain contract on file. PDMP reviewed and appropriate. Will refill tramadol at this time.  Repeat UDS at next visit.

## 2020-04-08 NOTE — Assessment & Plan Note (Signed)
Patient reports that she started to have back pain this morning, located on both sides, the lower back, sharp in nature, comes and goes, worse with walking, has not sure if her tramadol helped.  She reports having something similar a few years ago, thinks she may have history of arthritis.  She denies any abdominal pain, nausea, vomiting, dysuria, hematuria, fevers, chills, headaches, lightheadedness, dizziness, or other symptoms.  They do report that the bed that she is using has a large dip in the middle.  On exam she has some tenderness to palpation over the bilateral lumbar sacral medial muscles, no point tenderness over the spinous processes, negative straight leg raise test.  Overall seems to be more consistent with a muscle spasm.  Given information on muscle spasms and advised to use ibuprofen and Voltaren gel on the area.

## 2020-04-09 LAB — BMP8+ANION GAP
Anion Gap: 16 mmol/L (ref 10.0–18.0)
BUN/Creatinine Ratio: 14 (ref 12–28)
BUN: 12 mg/dL (ref 8–27)
CO2: 24 mmol/L (ref 20–29)
Calcium: 9.3 mg/dL (ref 8.7–10.3)
Chloride: 101 mmol/L (ref 96–106)
Creatinine, Ser: 0.84 mg/dL (ref 0.57–1.00)
GFR calc Af Amer: 80 mL/min/{1.73_m2} (ref >59)
GFR calc non Af Amer: 70 mL/min/{1.73_m2} (ref >59)
Glucose: 107 mg/dL — ABNORMAL HIGH (ref 65–99)
Potassium: 4 mmol/L (ref 3.5–5.2)
Sodium: 141 mmol/L (ref 134–144)

## 2020-04-10 NOTE — Progress Notes (Signed)
Internal Medicine Clinic Attending ° °Case discussed with Dr. Krienke  At the time of the visit.  We reviewed the resident’s history and exam and pertinent patient test results.  I agree with the assessment, diagnosis, and plan of care documented in the resident’s note.  °

## 2020-05-01 ENCOUNTER — Encounter: Payer: Self-pay | Admitting: Internal Medicine

## 2020-05-23 ENCOUNTER — Other Ambulatory Visit: Payer: Self-pay | Admitting: Internal Medicine

## 2020-05-23 DIAGNOSIS — Z79891 Long term (current) use of opiate analgesic: Secondary | ICD-10-CM

## 2020-05-23 DIAGNOSIS — I10 Essential (primary) hypertension: Secondary | ICD-10-CM

## 2020-05-23 NOTE — Assessment & Plan Note (Signed)
Refill request for tramadol. 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 Seen by PCP one month ago, and they are planning for UDS at next visit. PDMP reviewed. Refill tramadol 50 mg q12hr.

## 2020-06-12 ENCOUNTER — Other Ambulatory Visit: Payer: Self-pay | Admitting: Internal Medicine

## 2020-06-12 DIAGNOSIS — Z1231 Encounter for screening mammogram for malignant neoplasm of breast: Secondary | ICD-10-CM

## 2020-06-17 DIAGNOSIS — F209 Schizophrenia, unspecified: Secondary | ICD-10-CM | POA: Diagnosis not present

## 2020-07-09 ENCOUNTER — Other Ambulatory Visit: Payer: Self-pay

## 2020-07-09 ENCOUNTER — Ambulatory Visit
Admission: RE | Admit: 2020-07-09 | Discharge: 2020-07-09 | Disposition: A | Discharge status: Home / Self Care | Payer: Medicare (Managed Care) | Source: Ambulatory Visit | Attending: *Deleted | Admitting: *Deleted

## 2020-07-09 DIAGNOSIS — Z1231 Encounter for screening mammogram for malignant neoplasm of breast: Secondary | ICD-10-CM

## 2020-07-15 ENCOUNTER — Other Ambulatory Visit: Payer: Self-pay | Admitting: Internal Medicine

## 2020-07-15 DIAGNOSIS — Z794 Long term (current) use of insulin: Secondary | ICD-10-CM

## 2020-07-22 ENCOUNTER — Other Ambulatory Visit: Payer: Self-pay | Admitting: Internal Medicine

## 2020-07-22 DIAGNOSIS — Z79891 Long term (current) use of opiate analgesic: Secondary | ICD-10-CM

## 2020-08-05 ENCOUNTER — Other Ambulatory Visit: Payer: Self-pay | Admitting: Internal Medicine

## 2020-08-05 DIAGNOSIS — I10 Essential (primary) hypertension: Secondary | ICD-10-CM

## 2020-09-02 ENCOUNTER — Other Ambulatory Visit: Payer: Self-pay | Admitting: Internal Medicine

## 2020-09-02 DIAGNOSIS — Z79891 Long term (current) use of opiate analgesic: Secondary | ICD-10-CM

## 2020-09-02 NOTE — Telephone Encounter (Signed)
Last office visit: 04/08/2020 Last UDS: 04/24/2019 Last Written: 07/23/20 #60 Next appt: none scheduled-pt overdue for follow up (appt request sent to front office)

## 2020-10-03 ENCOUNTER — Encounter: Payer: Medicare (Managed Care) | Admitting: Internal Medicine

## 2020-10-07 ENCOUNTER — Other Ambulatory Visit: Payer: Self-pay | Admitting: Internal Medicine

## 2020-10-07 DIAGNOSIS — I10 Essential (primary) hypertension: Secondary | ICD-10-CM

## 2020-10-11 ENCOUNTER — Other Ambulatory Visit: Payer: Self-pay | Admitting: Internal Medicine

## 2020-10-11 DIAGNOSIS — Z79891 Long term (current) use of opiate analgesic: Secondary | ICD-10-CM

## 2020-10-16 ENCOUNTER — Other Ambulatory Visit: Payer: Self-pay | Admitting: *Deleted

## 2020-10-16 DIAGNOSIS — I10 Essential (primary) hypertension: Secondary | ICD-10-CM

## 2020-10-16 MED ORDER — METOPROLOL TARTRATE 50 MG PO TABS: 150.0000 mg | ORAL_TABLET | Freq: Two times a day (BID) | ORAL | 3 refills | Status: DC

## 2020-10-16 NOTE — Telephone Encounter (Signed)
Fax from [facility] requesting 90 day supply. Thanks

## 2020-10-23 ENCOUNTER — Telehealth: Payer: Self-pay | Admitting: *Deleted

## 2020-10-23 ENCOUNTER — Encounter: Payer: Medicare (Managed Care) | Admitting: Student

## 2020-10-23 NOTE — Telephone Encounter (Signed)
Call to patient about missed appointment for today.  Spoke with daughter.  Daughter will call back for an appointment in 3 weeks when daughter can bring her.  Sander Nephew, RN 10/23/2020 10:47 AM.

## 2020-10-31 ENCOUNTER — Other Ambulatory Visit: Payer: Self-pay | Admitting: Internal Medicine

## 2020-11-11 ENCOUNTER — Other Ambulatory Visit: Payer: Self-pay | Admitting: Student

## 2020-11-11 DIAGNOSIS — Z79891 Long term (current) use of opiate analgesic: Secondary | ICD-10-CM

## 2020-11-12 NOTE — Telephone Encounter (Signed)
Next appt scheduled 01/08/21 with PCP.

## 2020-11-19 ENCOUNTER — Other Ambulatory Visit: Payer: Self-pay | Admitting: Internal Medicine

## 2020-11-19 DIAGNOSIS — I10 Essential (primary) hypertension: Secondary | ICD-10-CM

## 2020-11-21 ENCOUNTER — Telehealth: Payer: Self-pay | Admitting: Internal Medicine

## 2020-11-21 NOTE — Telephone Encounter (Signed)
Refill Request   meclizine (ANTIVERT) 12.5 MG tablet   [place], [place] - [place] (Ph: [phone]

## 2020-11-22 NOTE — Telephone Encounter (Signed)
This RX was denied several times because she needs an appt, patient has a hx of several no-shows/cancellations.   TC to [facility], [place], she was notified of above, appt made for 12/09/20 @ 0845 w/ red team/Dr. Marianna Payment. SChaplin, RN,BSN

## 2020-12-09 ENCOUNTER — Telehealth: Payer: Self-pay | Admitting: *Deleted

## 2020-12-09 ENCOUNTER — Encounter: Payer: Medicare (Managed Care) | Admitting: Internal Medicine

## 2020-12-09 NOTE — Telephone Encounter (Signed)
Call to patient about missed appointment today.  Message left to call to reschedue if needed.  Sander Nephew, RN 12/09/2020 9:50 AM

## 2020-12-16 ENCOUNTER — Ambulatory Visit (INDEPENDENT_AMBULATORY_CARE_PROVIDER_SITE_OTHER): Payer: Medicare (Managed Care) | Admitting: Internal Medicine

## 2020-12-16 ENCOUNTER — Ambulatory Visit (HOSPITAL_COMMUNITY)
Admission: RE | Admit: 2020-12-16 | Discharge: 2020-12-16 | Disposition: A | Discharge status: Home / Self Care | Payer: Medicare (Managed Care) | Source: Ambulatory Visit | Attending: Internal Medicine | Admitting: Internal Medicine

## 2020-12-16 ENCOUNTER — Other Ambulatory Visit: Payer: Self-pay

## 2020-12-16 VITALS — BP 114/62 | HR 67 | Temp 98.1°F | Ht 70.0 in | Wt 216.1 lb

## 2020-12-16 DIAGNOSIS — E1165 Type 2 diabetes mellitus with hyperglycemia: Secondary | ICD-10-CM

## 2020-12-16 DIAGNOSIS — R42 Dizziness and giddiness: Secondary | ICD-10-CM

## 2020-12-16 DIAGNOSIS — R0789 Other chest pain: Secondary | ICD-10-CM | POA: Diagnosis not present

## 2020-12-16 DIAGNOSIS — M545 Low back pain, unspecified: Secondary | ICD-10-CM | POA: Diagnosis not present

## 2020-12-16 DIAGNOSIS — I10 Essential (primary) hypertension: Secondary | ICD-10-CM

## 2020-12-16 DIAGNOSIS — Z79891 Long term (current) use of opiate analgesic: Secondary | ICD-10-CM

## 2020-12-16 DIAGNOSIS — F209 Schizophrenia, unspecified: Secondary | ICD-10-CM

## 2020-12-16 LAB — POCT GLYCOSYLATED HEMOGLOBIN (HGB A1C): Hemoglobin A1C: 6.7 % — AB (ref 4.0–5.6)

## 2020-12-16 LAB — GLUCOSE, CAPILLARY: Glucose-Capillary: 75 mg/dL (ref 70–99)

## 2020-12-16 LAB — TROPONIN I (HIGH SENSITIVITY): Troponin I (High Sensitivity): 3 ng/L (ref <18)

## 2020-12-16 MED ORDER — DICLOFENAC SODIUM 1 % EX GEL: 2.0000 g | Freq: Four times a day (QID) | CUTANEOUS | 1 refills | Status: DC

## 2020-12-16 MED ORDER — TRAMADOL HCL 50 MG PO TABS: 50.0000 mg | ORAL_TABLET | Freq: Two times a day (BID) | ORAL | 0 refills | Status: DC | PRN

## 2020-12-16 NOTE — Patient Instructions (Signed)
Thank you, Ms.Nolon Bussing for allowing [place] to provide your care today. Today we discussed chest pain, Diabetes.    I have ordered the following labs for you:   Lab Orders     BMP8+Anion Gap     POC Hbg A1C   Tests ordered today:  EKG   Referrals ordered today:   Referral Orders  No referral(s) requested today     Medication Changes:   Medications Discontinued During This Encounter  Medication Reason  . diclofenac Sodium (VOLTAREN) 1 % GEL Reorder  . traMADol (ULTRAM) 50 MG tablet Reorder     Meds ordered this encounter  Medications  . traMADol (ULTRAM) 50 MG tablet    Sig: Take 1 tablet (50 mg total) by mouth every 12 (twelve) hours as needed.    Dispense:  60 tablet    Refill:  0  . diclofenac Sodium (VOLTAREN) 1 % GEL    Sig: Apply 2 g topically 4 (four) times daily.    Dispense:  50 g    Refill:  1     Instructions: I will order a continuous glucose monitor for you as well.   Follow up: 3 months   Remember:    Should you have any questions or concerns please call the internal medicine clinic at [phone].     Marianna Payment, D.O. [place]
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
## 2020-12-17 ENCOUNTER — Encounter: Payer: Self-pay | Admitting: Internal Medicine

## 2020-12-17 DIAGNOSIS — R0789 Other chest pain: Secondary | ICD-10-CM | POA: Insufficient documentation

## 2020-12-17 LAB — BMP8+ANION GAP
Anion Gap: 19 mmol/L — ABNORMAL HIGH (ref 10.0–18.0)
BUN/Creatinine Ratio: 21 (ref 12–28)
BUN: 18 mg/dL (ref 8–27)
CO2: 21 mmol/L (ref 20–29)
Calcium: 9 mg/dL (ref 8.7–10.3)
Chloride: 100 mmol/L (ref 96–106)
Creatinine, Ser: 0.84 mg/dL (ref 0.57–1.00)
Glucose: 82 mg/dL (ref 65–99)
Potassium: 4 mmol/L (ref 3.5–5.2)
Sodium: 140 mmol/L (ref 134–144)
eGFR: 73 mL/min/{1.73_m2} (ref >59)

## 2020-12-17 MED ORDER — MECLIZINE HCL 12.5 MG PO TABS: ORAL_TABLET | ORAL | 0 refills | Status: DC

## 2020-12-17 NOTE — Assessment & Plan Note (Signed)
Patient presents with atypical chest pain. It is located substernal and doe snot radiates. She does not have associated symptoms. No recent EKG. She denies shortness of breath and is hemodynamically stable.   Plan: - EKG - which showed mildly prolonged PR and Q waves in Leads I and AVL. - Troponin negative  - likely MSK pain as it is point tender which is reproduces her pain.

## 2020-12-17 NOTE — Assessment & Plan Note (Signed)
Patient presents for reevaluation of her HTN.   BP Readings from Last 3 Encounters:  12/16/20 114/62  04/08/20 (!) 122/56  11/20/19 (!) 124/59   Her pressure is well controlled on metoprolol 50mg , lisinopril 40mg , and HCTZ 25mg .  Plan: - Continue current medications - Will check BMP today.

## 2020-12-17 NOTE — Addendum Note (Signed)
Addended by: Lawerance Cruel on: 12/17/2020 05:24 PM   Modules accepted: Orders

## 2020-12-17 NOTE — Assessment & Plan Note (Signed)
We will refill her tramadol and voltaren today.

## 2020-12-17 NOTE — Assessment & Plan Note (Signed)
Patient presents for reevaluation of DM. Her A1c today is at goal of 6.7 on Jardiance 10 mg, Metformin 1000 mg BID, and levemir 25 units qday.   I am uncertain if the patient has been experiencing low blood sugars but does admit to some hand tremor, which could be secondary to her antipsychotic medications. She is on a beta blocker, which could be covering up some of her symtpoms of hypoglycemia. She admits to having a difficult time taking her blood sugar at home and has requested a CGM.  Plan: - Continue current medications - Will order CGM today for better control of her blood sugar.

## 2020-12-17 NOTE — Assessment & Plan Note (Signed)
Patient states that she has some hand tremors, which could be a side effect of her antipsychotics medication. I recommend talking to her psychiatrist about starting cogentin.

## 2020-12-18 ENCOUNTER — Telehealth: Payer: Self-pay | Admitting: *Deleted

## 2020-12-18 DIAGNOSIS — Z79891 Long term (current) use of opiate analgesic: Secondary | ICD-10-CM

## 2020-12-18 MED ORDER — TRAMADOL HCL 50 MG PO TABS: 50.0000 mg | ORAL_TABLET | Freq: Two times a day (BID) | ORAL | 0 refills | Status: DC | PRN

## 2020-12-18 NOTE — Telephone Encounter (Signed)
Patient's daughter called in stating [facility] cannot fill tramadol as Dr. Marianna Payment is not an authorized prescriber. Call placed to April at [facility]. States Dr. Sammie Bench DEA is not authorized under patient's plan. Requesting this be resent by Attending.

## 2020-12-18 NOTE — Telephone Encounter (Signed)
Done

## 2020-12-18 NOTE — Progress Notes (Signed)
Internal Medicine Clinic Attending  Case discussed with Dr. Coe  At the time of the visit.  We reviewed the resident's history and exam and pertinent patient test results.  I agree with the assessment, diagnosis, and plan of care documented in the resident's note.  

## 2020-12-19 ENCOUNTER — Telehealth: Payer: Self-pay | Admitting: Dietician

## 2020-12-19 ENCOUNTER — Encounter: Payer: Self-pay | Admitting: Dietician

## 2020-12-19 NOTE — Telephone Encounter (Signed)
I spoke with daughter about her mother getting a [employer] CGM. The will need to find out who is the DME supplier for her Medicare company and set up an account and ask them to fax [place] the order. I will mail them information on how to do that.

## 2021-01-08 ENCOUNTER — Ambulatory Visit: Payer: Medicare (Managed Care) | Admitting: Dietician

## 2021-01-08 ENCOUNTER — Encounter: Payer: Medicare (Managed Care) | Admitting: Internal Medicine

## 2021-01-08 NOTE — Progress Notes (Deleted)
   CC: Hypertension, diabetes, atypical chest pain,***  HPI:  [name] is a 75 y.o. with a history including hypertension, diabetes, atypical chest pain, hyperlipidemia, schizophrenia, and GERD presenting for follow-up of these conditions.  Patient is currently on HCTZ 25 mg daily, lisinopril to 40 mg daily, metoprolol 150 mg twice daily.  BP Readings from Last 3 Encounters:  12/16/20 114/62  04/08/20 (!) 122/56  11/20/19 (!) 124/59   ***  Patient is currently on insulin Levemir 25 units daily, Jardiance 10 mg daily, metformin 1000 mg twice daily.  She reports ***.    Past Medical History:  Diagnosis Date  . Atypical nevus   . Breast cancer ([place]) 07/14/06   Left breast. Ductal carcinoma, grade 3. Followed by Dr. Truddie Coco. s/p lumpectomy with radiation  . Dental caries   . Diabetes mellitus   . DVT (deep vein thrombosis) in pregnancy 1982   Affected the RLE. Required coumadin.  . Fibroids   . GERD (gastroesophageal reflux disease)   . Goiter    U/S 08/2009-persistent multiple small nodules, one in the right lower pole slightly enlarged, recommend continued followup.  Marland Kitchen History of mitral valve prolapse   . Hyperlipidemia   . Hypertension   . Ovarian cyst   . Personal history of radiation therapy   . Schizophrenia ([place])    Review of Systems:   Constitutional: Negative for chills and fever.  Respiratory: Negative for shortness of breath.   Cardiovascular: Negative for chest pain and leg swelling.  Gastrointestinal: Negative for abdominal pain, nausea and vomiting.  Neurological: Negative for dizziness and headaches.  ***  Physical Exam:  There were no vitals filed for this visit. General: *** HEENT: *** Cardiac: *** Pulmonary: *** Abdomen: *** Extremity: *** Neuro: *** Psychiatry: ***   Assessment & Plan:   See Encounters Tab for problem based charting.  Patient discussed with Dr.  {NAMES:[id number]::"Butcher","Guilloud","Hoffman","Mullen","Narendra","Raines","Vincent"}

## 2021-01-16 ENCOUNTER — Other Ambulatory Visit: Payer: Self-pay | Admitting: Student

## 2021-01-16 DIAGNOSIS — I10 Essential (primary) hypertension: Secondary | ICD-10-CM

## 2021-02-01 ENCOUNTER — Encounter: Payer: Self-pay | Admitting: *Deleted

## 2021-02-21 ENCOUNTER — Other Ambulatory Visit: Payer: Self-pay | Admitting: Internal Medicine

## 2021-02-21 DIAGNOSIS — Z79891 Long term (current) use of opiate analgesic: Secondary | ICD-10-CM

## 2021-02-21 DIAGNOSIS — I10 Essential (primary) hypertension: Secondary | ICD-10-CM

## 2021-04-15 ENCOUNTER — Other Ambulatory Visit: Payer: Self-pay | Admitting: Internal Medicine

## 2021-04-15 DIAGNOSIS — Z79891 Long term (current) use of opiate analgesic: Secondary | ICD-10-CM

## 2021-04-17 NOTE — Telephone Encounter (Signed)
PDMP reviewed, medication refill appropriate.  Patient needs UDS.  Have patient follow-up in September for diabetes as well as UDS.

## 2021-04-24 ENCOUNTER — Encounter: Payer: Medicare (Managed Care) | Admitting: Internal Medicine

## 2021-05-01 ENCOUNTER — Other Ambulatory Visit: Payer: Self-pay | Admitting: Internal Medicine

## 2021-05-01 DIAGNOSIS — E1165 Type 2 diabetes mellitus with hyperglycemia: Secondary | ICD-10-CM

## 2021-05-15 ENCOUNTER — Other Ambulatory Visit: Payer: Self-pay | Admitting: Internal Medicine

## 2021-05-15 DIAGNOSIS — K219 Gastro-esophageal reflux disease without esophagitis: Secondary | ICD-10-CM

## 2021-05-15 DIAGNOSIS — R42 Dizziness and giddiness: Secondary | ICD-10-CM

## 2021-05-27 ENCOUNTER — Other Ambulatory Visit: Payer: Self-pay | Admitting: Internal Medicine

## 2021-05-27 DIAGNOSIS — Z79891 Long term (current) use of opiate analgesic: Secondary | ICD-10-CM

## 2021-05-29 MED ORDER — TRAMADOL HCL 50 MG PO TABS: 50.0000 mg | ORAL_TABLET | Freq: Two times a day (BID) | ORAL | 0 refills | Status: DC | PRN

## 2021-05-29 NOTE — Addendum Note (Signed)
Addended by: Velora Heckler on: 05/29/2021 04:03 PM   Modules accepted: Orders

## 2021-05-30 ENCOUNTER — Other Ambulatory Visit: Payer: Self-pay | Admitting: Internal Medicine

## 2021-05-30 DIAGNOSIS — Z79891 Long term (current) use of opiate analgesic: Secondary | ICD-10-CM

## 2021-06-04 ENCOUNTER — Telehealth: Payer: Self-pay | Admitting: *Deleted

## 2021-06-04 NOTE — Telephone Encounter (Signed)
Call from pt's daughter, stating pt needs a refill on Tramadol. Informed pt's daughter Tramadol was refilled on 9/22, sent to [facility] on [address]. Asked her to call the pharmacy and if she any problems, to call me back.

## 2021-06-05 ENCOUNTER — Telehealth: Payer: Self-pay

## 2021-06-05 DIAGNOSIS — Z79891 Long term (current) use of opiate analgesic: Secondary | ICD-10-CM

## 2021-06-05 MED ORDER — TRAMADOL HCL 50 MG PO TABS: 50.0000 mg | ORAL_TABLET | Freq: Two times a day (BID) | ORAL | 0 refills | Status: DC | PRN

## 2021-06-05 NOTE — Telephone Encounter (Signed)
Done

## 2021-06-05 NOTE — Telephone Encounter (Signed)
Imani with [facility] requesting to speak with a nurse about getting different doctor to send in Rx for traMADol (ULTRAM) 50 MG tablet.

## 2021-06-05 NOTE — Telephone Encounter (Signed)
Rx for tramadol sent on 9/22 by third year Resident. Call placed to [facility]. Line busy x 2.

## 2021-06-05 NOTE — Telephone Encounter (Signed)
Spoke with Ellis Savage at [place]. States their system will not allow suffix on Resident's DEA to go through. Requesting this be resent by Attending.

## 2021-06-10 ENCOUNTER — Encounter: Payer: Medicare (Managed Care) | Admitting: Internal Medicine

## 2021-06-17 ENCOUNTER — Other Ambulatory Visit: Payer: Self-pay

## 2021-06-17 MED ORDER — PRAVASTATIN SODIUM 40 MG PO TABS: 40.0000 mg | ORAL_TABLET | Freq: Every day | ORAL | 3 refills | Status: DC

## 2021-06-19 ENCOUNTER — Encounter: Payer: Medicare (Managed Care) | Admitting: Student

## 2021-06-25 ENCOUNTER — Encounter: Payer: Self-pay | Admitting: *Deleted

## 2021-06-25 NOTE — Progress Notes (Unsigned)

## 2021-06-29 ENCOUNTER — Other Ambulatory Visit: Payer: Self-pay | Admitting: Internal Medicine

## 2021-06-29 DIAGNOSIS — I10 Essential (primary) hypertension: Secondary | ICD-10-CM

## 2021-07-01 ENCOUNTER — Other Ambulatory Visit: Payer: Self-pay | Admitting: Internal Medicine

## 2021-07-01 DIAGNOSIS — Z794 Long term (current) use of insulin: Secondary | ICD-10-CM

## 2021-07-11 ENCOUNTER — Encounter: Payer: Self-pay | Admitting: Student

## 2021-07-11 NOTE — Progress Notes (Signed)
Things That May Be Affecting Your Health:  Alcohol  Hearing loss X Pain    Depression  Home Safety  Sexual Health  X Diabetes  Lack of physical activity  Stress   Difficulty with daily activities  Loneliness  Tiredness   Drug use  Medicines  Tobacco use   Falls  Motor Vehicle Safety  Weight   Food choices  Oral Health  Other    YOUR PERSONALIZED HEALTH PLAN : 1. Schedule your next subsequent Medicare Wellness visit in one year 2. Attend all of your regular appointments to address your medical issues 3. Complete the preventative screenings and services   Annual Wellness Visit   Medicare Covered Preventative Screenings and [place] Men and Women Who How Often Need? Date of Last Service Action  Abdominal Aortic Aneurysm Adults with AAA risk factors Once      Alcohol Misuse and Counseling All Adults Screening once a year if no alcohol misuse. Counseling up to 4 face to face sessions.     Bone Density Measurement  Adults at risk for osteoporosis Once every 2 yrs      Lipid Panel Z13.6 All adults without CV disease Once every 5 yrs       Colorectal Cancer  Stool sample or Colonoscopy All adults 53 and older  Once every year Every 10 years X       Depression All Adults Once a year  Today   Diabetes Screening Blood glucose, post glucose load, or GTT Z13.1 All adults at risk Pre-diabetics Once per year Twice per year      Diabetes  Self-Management Training All adults Diabetics 10 hrs first year; 2 hours subsequent years. Requires Copay     Glaucoma Diabetics Family history of glaucoma African Americans 51 yrs + Hispanic Americans 47 yrs + Annually - requires coppay      Hepatitis C Z72.89 or F19.20 High Risk for HCV Born between 1945 and 1965 Annually Once      HIV Z11.4 All adults based on risk Annually btw ages 59 & 22 regardless of risk Annually > 65 yrs if at increased risk      Lung Cancer Screening Asymptomatic adults aged 62-77 with 30  pack yr history and current smoker OR quit within the last 15 yrs Annually Must have counseling and shared decision making documentation before first screen      Medical Nutrition Therapy Adults with  Diabetes Renal disease Kidney transplant within past 3 yrs 3 hours first year; 2 hours subsequent years     Obesity and Counseling All adults Screening once a year Counseling if BMI 30 or higher  Today   Tobacco Use Counseling Adults who use tobacco  Up to 8 visits in one year     Vaccines Z23 Hepatitis B Influenza  Pneumonia  Adults  Once Once every flu season Two different vaccines separated by one year X    Next Annual Wellness Visit People with Medicare Every year  Today     Services & Screenings Women Who How Often Need  Date of Last Service Action  Mammogram  Z12.31 Women over 4 One baseline ages 63-39. Annually ager 40 yrs+      Pap tests All women Annually if high risk. Every 2 yrs for normal risk women      Screening for cervical cancer with  Pap (Z01.419 nl or Z01.411abnl) & HPV Z11.51 Women aged 47 to 58 Once every 5 yrs  Screening pelvic and breast exams All women Annually if high risk. Every 2 yrs for normal risk women     Sexually Transmitted Diseases Chlamydia Gonorrhea Syphilis All at risk adults Annually for non pregnant females at increased risk         [place] Men Who How Ofter Need  Date of Last Service Action  Prostate Cancer - DRE & PSA Men over 50 Annually.  DRE might require a copay.        Sexually Transmitted Diseases Syphilis All at risk adults Annually for men at increased risk      Health Maintenance List Health Maintenance  Topic Date Due   COVID-19 Vaccine (1) Never done   Pneumonia Vaccine 16+ Years old (1 - PCV) Never done   Zoster Vaccines- Shingrix (1 of 2) Never done   COLONOSCOPY (Pts 45-75yrs Insurance coverage will need to be confirmed)  Never done   OPHTHALMOLOGY EXAM  09/10/2016   COLON CANCER SCREENING  ANNUAL FOBT  12/02/2016   FOOT EXAM  11/19/2020   HEMOGLOBIN A1C  03/17/2021   INFLUENZA VACCINE  04/07/2021   MAMMOGRAM  07/09/2022   TETANUS/TDAP  11/19/2026   DEXA SCAN  Completed   Hepatitis C Screening  Completed   HPV VACCINES  Aged Out   LIPID PANEL  Discontinued

## 2021-07-14 ENCOUNTER — Ambulatory Visit: Payer: Medicare (Managed Care) | Admitting: Pharmacist

## 2021-07-15 ENCOUNTER — Other Ambulatory Visit: Payer: Self-pay | Admitting: Student in an Organized Health Care Education/Training Program

## 2021-07-15 DIAGNOSIS — Z79891 Long term (current) use of opiate analgesic: Secondary | ICD-10-CM

## 2021-08-08 ENCOUNTER — Other Ambulatory Visit: Payer: Self-pay | Admitting: Family Medicine

## 2021-08-08 ENCOUNTER — Other Ambulatory Visit: Payer: Self-pay | Admitting: Internal Medicine

## 2021-08-08 DIAGNOSIS — Z1231 Encounter for screening mammogram for malignant neoplasm of breast: Secondary | ICD-10-CM

## 2021-08-12 ENCOUNTER — Other Ambulatory Visit: Payer: Self-pay | Admitting: Internal Medicine

## 2021-08-12 DIAGNOSIS — E1165 Type 2 diabetes mellitus with hyperglycemia: Secondary | ICD-10-CM

## 2021-08-21 ENCOUNTER — Encounter: Payer: Medicare (Managed Care) | Admitting: Internal Medicine

## 2021-08-28 ENCOUNTER — Encounter: Payer: Medicare (Managed Care) | Admitting: Internal Medicine

## 2021-09-05 ENCOUNTER — Other Ambulatory Visit: Payer: Self-pay | Admitting: Student in an Organized Health Care Education/Training Program

## 2021-09-05 DIAGNOSIS — Z79891 Long term (current) use of opiate analgesic: Secondary | ICD-10-CM

## 2021-09-06 ENCOUNTER — Other Ambulatory Visit: Payer: Self-pay | Admitting: Student

## 2021-09-06 DIAGNOSIS — K219 Gastro-esophageal reflux disease without esophagitis: Secondary | ICD-10-CM

## 2021-09-11 ENCOUNTER — Other Ambulatory Visit: Payer: Self-pay

## 2021-09-11 DIAGNOSIS — E118 Type 2 diabetes mellitus with unspecified complications: Secondary | ICD-10-CM

## 2021-09-11 DIAGNOSIS — Z794 Long term (current) use of insulin: Secondary | ICD-10-CM

## 2021-09-11 MED ORDER — LEVEMIR FLEXTOUCH 100 UNIT/ML ~~LOC~~ SOPN: 25.0000 [IU] | PEN_INJECTOR | Freq: Every day | SUBCUTANEOUS | 1 refills | Status: DC

## 2021-09-11 NOTE — Telephone Encounter (Signed)
insulin detemir (LEVEMIR FLEXTOUCH) 100 UNIT/ML FlexPen, REFILL REQUEST @ [place].

## 2021-09-11 NOTE — Telephone Encounter (Signed)
Next appt scheduled 10/03/21 with PCP. °

## 2021-09-17 ENCOUNTER — Ambulatory Visit: Payer: Medicare (Managed Care)

## 2021-09-18 ENCOUNTER — Encounter: Payer: Self-pay | Admitting: Internal Medicine

## 2021-10-03 ENCOUNTER — Encounter: Payer: Medicare (Managed Care) | Admitting: Student

## 2021-10-07 ENCOUNTER — Other Ambulatory Visit: Payer: Self-pay | Admitting: Internal Medicine

## 2021-10-07 DIAGNOSIS — I10 Essential (primary) hypertension: Secondary | ICD-10-CM

## 2021-10-15 ENCOUNTER — Telehealth: Payer: Self-pay | Admitting: Internal Medicine

## 2021-10-15 NOTE — Telephone Encounter (Signed)
Just spoke with patient's daughter, [name].  She agreed to an appointment for her mom on 11/04/2021 at 9:15 am.  Made her aware that if appointment is not kept, she may not able to continue getting refills on her medicines in the future.

## 2021-10-15 NOTE — Telephone Encounter (Signed)
-----   Message from Wayland Denis, MD sent at 10/08/2021  7:51 PM EST ----- Regarding: needs appt Good morning team,  Patient needs appointment for repeat lab-work while on HCTZ and potassium supplementation, refilled potassium today. Saw she has no showed appts. Let her know she needs to come in if we are to continue to refill medication.   Thank you,  Dr. Earnest Conroy

## 2021-10-22 ENCOUNTER — Other Ambulatory Visit: Payer: Self-pay | Admitting: Internal Medicine

## 2021-10-22 DIAGNOSIS — Z79891 Long term (current) use of opiate analgesic: Secondary | ICD-10-CM

## 2021-11-04 ENCOUNTER — Ambulatory Visit (INDEPENDENT_AMBULATORY_CARE_PROVIDER_SITE_OTHER): Payer: Medicare (Managed Care) | Admitting: Internal Medicine

## 2021-11-04 ENCOUNTER — Encounter: Payer: Self-pay | Admitting: Internal Medicine

## 2021-11-04 VITALS — BP 107/61 | HR 69 | Temp 98.1°F | Ht 70.0 in | Wt 216.1 lb

## 2021-11-04 DIAGNOSIS — I10 Essential (primary) hypertension: Secondary | ICD-10-CM

## 2021-11-04 DIAGNOSIS — E1165 Type 2 diabetes mellitus with hyperglycemia: Secondary | ICD-10-CM

## 2021-11-04 DIAGNOSIS — R1310 Dysphagia, unspecified: Secondary | ICD-10-CM | POA: Diagnosis not present

## 2021-11-04 DIAGNOSIS — K219 Gastro-esophageal reflux disease without esophagitis: Secondary | ICD-10-CM | POA: Diagnosis not present

## 2021-11-04 DIAGNOSIS — Z0000 Encounter for general adult medical examination without abnormal findings: Secondary | ICD-10-CM | POA: Insufficient documentation

## 2021-11-04 DIAGNOSIS — Z794 Long term (current) use of insulin: Secondary | ICD-10-CM

## 2021-11-04 DIAGNOSIS — E049 Nontoxic goiter, unspecified: Secondary | ICD-10-CM

## 2021-11-04 DIAGNOSIS — E119 Type 2 diabetes mellitus without complications: Secondary | ICD-10-CM

## 2021-11-04 HISTORY — DX: Dysphagia, unspecified: R13.10

## 2021-11-04 LAB — POCT GLYCOSYLATED HEMOGLOBIN (HGB A1C): Hemoglobin A1C: 6.5 % — AB (ref 4.0–5.6)

## 2021-11-04 LAB — GLUCOSE, CAPILLARY: Glucose-Capillary: 97 mg/dL (ref 70–99)

## 2021-11-04 MED ORDER — PANTOPRAZOLE SODIUM 40 MG PO TBEC: 40.0000 mg | DELAYED_RELEASE_TABLET | Freq: Every day | ORAL | 2 refills | Status: DC

## 2021-11-04 MED ORDER — ONDANSETRON HCL 4 MG PO TABS: 4.0000 mg | ORAL_TABLET | Freq: Every day | ORAL | 1 refills | Status: AC | PRN

## 2021-11-04 NOTE — Assessment & Plan Note (Addendum)
Patient reports 1 to 2 months of nausea and vomiting.  Vomiting occurs about 1-2 times a month.  Patient denies early satiety.  Denies burning epigastric pain or symptoms of heartburn.  Is taking Protonix 20 mg daily.  Reports regular bowel movements few times a week, takes stool softener as needed.  Has dull epigastric and umbilical abdominal pain hours after eating.  Patient also reports voice change in the last few years, also reporting odynophagia when swallowing large solids such as meat.  Notably patient does have dental disease which makes chewing difficult.  Concerning [name] she is also endorsing difficulty swallowing both solids and liquids.  She endorses choking/"strangulating" with liquids and soft foods and this occurs most days.  Patient is currently a non-smoker though does have 36-year smoking history, smoking 2 packs/day totaling a 72-pack-year history.  No gross abnormalities on examination of the oropharynx and neck, though possible swelling on anterior neck that may represent a goiter, soft symmetric lobes, no nodules.  On assessment, patient has symptoms of nausea, vomiting, odynophagia, dysphagia that occurs most days and patient with tobacco use history.  Symptoms are concerning for mass or tumor.  Also on the differential is undiagnosed stroke causing dysphagia, has CVA risk factors. Also may have a goiter present.  Patient would benefit from additional imaging to further evaluate and characterize dysphagia.  Plan: -Barium esophagram -Ambulatory referral to GI for odynophagia/dysphagia -Zofran as needed -Increase Protonix to 40 mg daily  ADDENDUM: Discussed with patient on 11/07/21 adding on thyroid ultrasound and lab work (TSH, T4) for comprehensive workup of new dysphagia. Patient and daughter in agreement.  Plan: -TSH, T4 -Thyroid US

## 2021-11-04 NOTE — Patient Instructions (Signed)
Thank you, Ms.Nolon Bussing for allowing [place] to provide your care today. Today we discussed difficulty swallowing and low blood pressure.    Your blood pressure today was low.  We are going to discontinue your hydrochlorothiazide and your potassium supplementation.  We are still going to get kidney lab work to make sure electrolytes are okay.  Please continue to check your blood pressure at home so we can see how high your blood pressure is coming after discontinuing that blood pressure medication.  We will have you follow-up in 1 month for blood pressure check.  We also discussed your nausea, vomiting, difficulty swallowing, pain with swallowing.  I have referred you to see a gastroenterologist.  I have also ordered a esophagram swallow study.  They will call you to schedule this.  I have also ordered Zofran which is a nausea medication that you can use once daily as needed.  I ordered 7 tablets and you have a refill of 7 tablets as well.  Please schedule your eye exam and your mammogram.  I have ordered the following labs for you:   Lab Orders         Glucose, capillary         BMP8+Anion Gap         POC Hbg A1C      I will call if any are abnormal. All of your labs can be accessed through "My Chart".  My Chart Access: [email or web address]  Please follow-up in 1 month for blood pressure check.  Please make sure to arrive 15 minutes prior to your next appointment. If you arrive late, you may be asked to reschedule.    We look forward to seeing you next time. Please call our clinic at [phone] if you have any questions or concerns. The best time to call is Monday-Friday from 9am-4pm, but there is someone available 24/7. If after hours or the weekend, call the main hospital number and ask for the Internal Medicine Resident On-Call. If you need medication refills, please notify your pharmacy one week in advance and they will send [place] a request.    Thank you for letting us take part in your care. Wishing you the best!  Wayland Denis, MD 11/04/2021, 11:01 AM IM Resident, PGY-1

## 2021-11-04 NOTE — Progress Notes (Addendum)
CC: Routine health visit  HPI:  [name] is a 76 y.o. female with a past medical history stated below and presents today for routine health visit. Please see problem based assessment and plan for additional details.  Past Medical History:  Diagnosis Date   Atypical nevus    Breast cancer ([place]) 07/14/06   Left breast. Ductal carcinoma, grade 3. Followed by Dr. Truddie Coco. s/p lumpectomy with radiation   Dental caries    Diabetes mellitus    DVT (deep vein thrombosis) in pregnancy 1982   Affected the RLE. Required coumadin.   Fibroids    GERD (gastroesophageal reflux disease)    Goiter    U/S 08/2009-persistent multiple small nodules, one in the right lower pole slightly enlarged, recommend continued followup.   History of mitral valve prolapse    Hyperlipidemia    Hypertension    Ovarian cyst    Personal history of radiation therapy    Schizophrenia ([place])     Current Outpatient Medications on File Prior to Visit  Medication Sig Dispense Refill   aspirin 81 MG tablet Take 1 tablet (81 mg total) by mouth daily. 30 tablet 11   diclofenac Sodium (VOLTAREN) 1 % GEL Apply 2 g topically 4 (four) times daily. 50 g 1   insulin detemir (LEVEMIR FLEXTOUCH) 100 UNIT/ML FlexPen Inject 25 Units into the skin at bedtime. INJECT 25 UNITS SUBCUTANEOUSLY ONCE DAILY AT 10 PM 45 mL 1   JARDIANCE 10 MG TABS tablet Take 1 tablet by mouth once daily 90 tablet 0   lisinopril (ZESTRIL) 40 MG tablet Take 1 tablet by mouth once daily 90 tablet 3   meclizine (ANTIVERT) 12.5 MG tablet TAKE 1 TABLET BY MOUTH TWICE DAILY AS NEEDED FOR DIZZINESS 60 tablet 0   metFORMIN (GLUCOPHAGE) 1000 MG tablet TAKE 1 TABLET BY MOUTH TWICE DAILY WITH MEALS 180 tablet 0   metoprolol tartrate (LOPRESSOR) 50 MG tablet Take 3 tablets (150 mg total) by mouth 2 (two) times daily. 540 tablet 3   OLANZapine (ZYPREXA) 10 MG tablet Take 30 mg by mouth at bedtime.      pravastatin (PRAVACHOL) 40 MG tablet Take 1 tablet by mouth  once daily 90 tablet 0   QUEtiapine (SEROQUEL) 50 MG tablet Take 150 mg by mouth at bedtime.     traMADol (ULTRAM) 50 MG tablet TAKE 1 TABLET BY MOUTH EVERY 12 HOURS AS NEEDED 60 tablet 0   No current facility-administered medications on file prior to visit.    Family History  Problem Relation Age of Onset   Diabetes Mother    Diabetes Father     Review of Systems: ROS negative except for what is noted on the assessment and plan.  Vitals:   11/04/21 0951  BP: 107/61  Pulse: 69  Temp: 98.1 F (36.7 C)  TempSrc: Oral  SpO2: 97%  Weight: 216 lb 1.6 oz (98 kg)  Height: 5\' 10"  (1.778 m)   Physical Exam: General: Well appearing elderly, well-nourished, African-American female, NAD HENT: normocephalic, atraumatic, upper dentures in place, no abnormalities of the oropharynx, no masses palpated in the neck. No LAD.  Possible goiter anterior neck, soft symmetric lobes, no nodules. EYES: conjunctiva non-erythematous, no scleral icterus CV: regular rate, normal rhythm, no murmurs, rubs, gallops. Pulmonary: normal work of breathing on RA, lungs clear to auscultation, no rales, wheezes, rhonchi Abdominal: non-distended, soft, mild tenderness to palpation periumbilical area, normal BS Skin: Warm and dry, 1inch nevus on right lower extremity (patient's daughter reports  this has been previously deemed benign and has not changed in size) Neurological: MS: awake, alert and oriented x3, normal speech and fund of knowledge Motor: moves all extremities antigravity Psych: normal affect  Assessment & Plan:   See Encounters Tab for problem based charting.  Patient discussed with Dr.  Solon Augusta, M.D. [facility], PGY-1 Pager: [phone] Date 11/04/2021 Time 2:18 PM

## 2021-11-04 NOTE — Assessment & Plan Note (Signed)
Patient presents for diabetes follow-up.  Last hemoglobin A1c April 2022 was 6.7%.  Hemoglobin A1c today is 6.5%.  Patient is currently on Jardiance 10 mg daily, metformin 1000 mg twice daily and Levemir 25 units daily.  Patient denies symptoms of hypoglycemia, lowest CBG 97.  No CBGs below 70.  Patient denies tingling/burning/numbness in the hands and feet.  Patient is due for an eye exam, patient's daughter notes that she will call [facility] to set up an appointment.  On assessment, patient's T2DM is well controlled on current regimen.  Given patient's age we may be able to liberalize hemoglobin A1c to reduce or discontinue Levemir use in the future.  Plan: -Continue Jardiance 10 mg daily -Continue metformin 1000 mg twice daily -Continue Levemir 25 units daily, though consider reducing at next OV -Foot exam completed today -Patient's daughter to schedule eye appointment with [facility] clinic

## 2021-11-04 NOTE — Assessment & Plan Note (Addendum)
Patient presents for routine follow-up visit.  Has not been seen since April 2022.  Is accompanied today by her daughter Freda Munro.  Patient's blood pressure today 107/61.  She is currently on metoprolol 50 mg daily, lisinopril 40 mg daily and hydrochlorothiazide 25 mg daily.  Also on potassium supplementation, 10 mEq daily. Last BMP without electrolyte abnormalities April 2022.  Patient endorses symptoms of hypotension, reports has dizziness and lightheadedness and presyncopal symptoms when changing positions from seated to standing.  On assessment, given hypotensive symptoms and BP 621 systolic today, we will discontinue hydrochlorothiazide as well as potassium supplementation.  We will still obtain BMP to check renal function and electrolytes.  Patient and daughter will check patient's BPs at home to ensure her BPs do not elevate too high after coming off of HCTZ.  Counseled to call office if this occurs.  Plan: -Continue metoprolol 50 mg daily -Continue lisinopril 40 mg daily -BMP today -Discontinue hydrochlorothiazide and potassium supplementation -Home BP checks -Follow-up in 1 month for BP recheck  ADDENDUM: BMP unremarkable

## 2021-11-04 NOTE — Assessment & Plan Note (Signed)
Patient will schedule eye appointment and mammogram appointment.  Foot exam completed today.  Please discuss vaccination: Shingles, COVID booster, pneumonia vaccine at next OV.  Patient was referred to GI for odynophagia/dysphagia, please ask patient if GI will also schedule colonoscopy since she is due for 1 vs Cologuard.

## 2021-11-05 LAB — BMP8+ANION GAP
Anion Gap: 15 mmol/L (ref 10.0–18.0)
BUN/Creatinine Ratio: 17 (ref 12–28)
BUN: 16 mg/dL (ref 8–27)
CO2: 26 mmol/L (ref 20–29)
Calcium: 9.5 mg/dL (ref 8.7–10.3)
Chloride: 95 mmol/L — ABNORMAL LOW (ref 96–106)
Creatinine, Ser: 0.96 mg/dL (ref 0.57–1.00)
Glucose: 99 mg/dL (ref 70–99)
Potassium: 4.5 mmol/L (ref 3.5–5.2)
Sodium: 136 mmol/L (ref 134–144)
eGFR: 62 mL/min/{1.73_m2} (ref >59)

## 2021-11-06 NOTE — Addendum Note (Signed)
Addended by: Charise Killian on: 11/06/2021 09:04 AM   Modules accepted: Orders

## 2021-11-07 ENCOUNTER — Ambulatory Visit: Payer: Medicare (Managed Care)

## 2021-11-07 NOTE — Addendum Note (Signed)
Addended by: Wayland Denis on: 11/07/2021 03:12 PM   Modules accepted: Orders

## 2021-11-11 NOTE — Progress Notes (Signed)
Internal Medicine Clinic Attending ? ?Case discussed with Dr. Zinoviev  At the time of the visit.  We reviewed the resident?s history and exam and pertinent patient test results.  I agree with the assessment, diagnosis, and plan of care documented in the resident?s note.  ?

## 2021-11-17 ENCOUNTER — Inpatient Hospital Stay: Admission: RE | Admit: 2021-11-17 | Payer: Medicare (Managed Care) | Source: Ambulatory Visit

## 2021-11-17 ENCOUNTER — Other Ambulatory Visit: Payer: Medicare (Managed Care)

## 2021-11-18 ENCOUNTER — Ambulatory Visit: Payer: Medicare (Managed Care)

## 2021-11-19 ENCOUNTER — Other Ambulatory Visit: Payer: Self-pay | Admitting: Internal Medicine

## 2021-11-19 DIAGNOSIS — E1165 Type 2 diabetes mellitus with hyperglycemia: Secondary | ICD-10-CM

## 2021-11-25 ENCOUNTER — Other Ambulatory Visit (INDEPENDENT_AMBULATORY_CARE_PROVIDER_SITE_OTHER): Payer: Medicare (Managed Care)

## 2021-11-25 DIAGNOSIS — E049 Nontoxic goiter, unspecified: Secondary | ICD-10-CM

## 2021-11-25 DIAGNOSIS — R1310 Dysphagia, unspecified: Secondary | ICD-10-CM

## 2021-11-26 ENCOUNTER — Other Ambulatory Visit: Payer: Medicare (Managed Care)

## 2021-11-26 LAB — T4, FREE: Free T4: 1.15 ng/dL (ref 0.82–1.77)

## 2021-11-26 LAB — TSH: TSH: 2.73 u[IU]/mL (ref 0.450–4.500)

## 2021-12-01 ENCOUNTER — Other Ambulatory Visit: Payer: Medicare (Managed Care)

## 2021-12-02 ENCOUNTER — Other Ambulatory Visit: Payer: Self-pay | Admitting: Internal Medicine

## 2021-12-02 ENCOUNTER — Ambulatory Visit
Admission: RE | Admit: 2021-12-02 | Discharge: 2021-12-02 | Disposition: A | Discharge status: Home / Self Care | Payer: Medicare (Managed Care) | Source: Ambulatory Visit | Attending: Internal Medicine | Admitting: Internal Medicine

## 2021-12-02 ENCOUNTER — Other Ambulatory Visit: Payer: Self-pay | Admitting: Student

## 2021-12-02 DIAGNOSIS — E049 Nontoxic goiter, unspecified: Secondary | ICD-10-CM

## 2021-12-02 DIAGNOSIS — R1310 Dysphagia, unspecified: Secondary | ICD-10-CM

## 2021-12-02 DIAGNOSIS — I10 Essential (primary) hypertension: Secondary | ICD-10-CM

## 2021-12-02 DIAGNOSIS — Z79891 Long term (current) use of opiate analgesic: Secondary | ICD-10-CM

## 2021-12-04 ENCOUNTER — Ambulatory Visit
Admission: RE | Admit: 2021-12-04 | Discharge: 2021-12-04 | Disposition: A | Discharge status: Home / Self Care | Payer: Medicare (Managed Care) | Source: Ambulatory Visit | Attending: Internal Medicine | Admitting: Internal Medicine

## 2021-12-04 DIAGNOSIS — Z1231 Encounter for screening mammogram for malignant neoplasm of breast: Secondary | ICD-10-CM

## 2022-01-01 ENCOUNTER — Other Ambulatory Visit: Payer: Self-pay | Admitting: Internal Medicine

## 2022-01-01 DIAGNOSIS — R1310 Dysphagia, unspecified: Secondary | ICD-10-CM

## 2022-01-01 NOTE — Telephone Encounter (Signed)
Call to patient's daughter Adela Lank.  Stated that patient is eating and not having any vomiting.   Unable to talk with patient. Adela Lank said that patient would just give the phone back to her to talk.   Adela Lank stated patient is eating a lot of take-out foods that she should not be eating.  Will call for an appointment for patient if she notices any vomiting or feels patient needs a medication for vomiting. ?

## 2022-02-13 ENCOUNTER — Other Ambulatory Visit: Payer: Self-pay | Admitting: Internal Medicine

## 2022-02-13 DIAGNOSIS — I10 Essential (primary) hypertension: Secondary | ICD-10-CM

## 2022-02-13 DIAGNOSIS — Z79891 Long term (current) use of opiate analgesic: Secondary | ICD-10-CM

## 2022-02-16 NOTE — Telephone Encounter (Signed)
Has cancelled several appointments in the Clinics and X-rays.

## 2022-02-16 NOTE — Telephone Encounter (Signed)
traMADol (ULTRAM) 50 MG tablet, REFILL REQUEST @ [place].

## 2022-02-17 ENCOUNTER — Telehealth: Payer: Self-pay | Admitting: *Deleted

## 2022-02-17 NOTE — Telephone Encounter (Signed)
Called pt - talked to her daughter about scheduling pt an appt per Dr Vinetta Bergamo. F/U BP and continue med refills. Daughter stated they did not hear back from anyone about test results - informed the doctor can discuss results at the appt. Call transferred to front office - appt schedule w/Dr Humphrey Rolls 6 /30 @ 0945 AM.

## 2022-02-17 NOTE — Telephone Encounter (Signed)
-----   Message from Wayland Denis, MD sent at 02/17/2022  8:09 AM EDT ----- Regarding: Patient needs appointment [facility] team,  I have refilled patient's medications. She does need to follow up with [place] in clinic if we are to continue to prescribe her medications. I saw her in Feb and she was asked to follow up in March.  Thank you,  Dr. Earnest Conroy

## 2022-03-01 ENCOUNTER — Encounter: Payer: Self-pay | Admitting: *Deleted

## 2022-03-06 ENCOUNTER — Encounter: Payer: Medicare (Managed Care) | Admitting: Internal Medicine

## 2022-03-06 NOTE — Progress Notes (Deleted)
CC: Follow up  HPI:  [name] is a 76 y.o. with medical history as below presenting to [facility] for follow up  Please see problem-based list for further details, assessments, and plans.  Past Medical History:  Diagnosis Date   Atypical nevus    Breast cancer ([place]) 07/14/06   Left breast. Ductal carcinoma, grade 3. Followed by Dr. Truddie Coco. s/p lumpectomy with radiation   Dental caries    Diabetes mellitus    DVT (deep vein thrombosis) in pregnancy 1982   Affected the RLE. Required coumadin.   Fibroids    GERD (gastroesophageal reflux disease)    Goiter    U/S 08/2009-persistent multiple small nodules, one in the right lower pole slightly enlarged, recommend continued followup.   History of mitral valve prolapse    Hyperlipidemia    Hypertension    Ovarian cyst    Personal history of radiation therapy    Schizophrenia ([place])     Current Outpatient Medications (Endocrine & Metabolic):    insulin detemir (LEVEMIR FLEXTOUCH) 100 UNIT/ML FlexPen, Inject 25 Units into the skin at bedtime. INJECT 25 UNITS SUBCUTANEOUSLY ONCE DAILY AT 10 PM   JARDIANCE 10 MG TABS tablet, Take 1 tablet by mouth once daily   metFORMIN (GLUCOPHAGE) 1000 MG tablet, TAKE 1 TABLET BY MOUTH TWICE DAILY WITH MEALS  Current Outpatient Medications (Cardiovascular):    lisinopril (ZESTRIL) 40 MG tablet, Take 1 tablet by mouth once daily   metoprolol tartrate (LOPRESSOR) 50 MG tablet, TAKE 3 TABLETS BY MOUTH TWICE DAILY   pravastatin (PRAVACHOL) 40 MG tablet, Take 1 tablet by mouth once daily   Current Outpatient Medications (Analgesics):    aspirin 81 MG tablet, Take 1 tablet (81 mg total) by mouth daily.   traMADol (ULTRAM) 50 MG tablet, TAKE 1 TABLET BY MOUTH EVERY 12 HOURS AS NEEDED   Current Outpatient Medications (Other):    diclofenac Sodium (VOLTAREN) 1 % GEL, Apply 2 g topically 4 (four) times daily.   meclizine (ANTIVERT) 12.5 MG tablet, TAKE 1 TABLET BY MOUTH TWICE DAILY AS NEEDED FOR  DIZZINESS   OLANZapine (ZYPREXA) 10 MG tablet, Take 30 mg by mouth at bedtime.    pantoprazole (PROTONIX) 40 MG tablet, Take 1 tablet (40 mg total) by mouth daily.   QUEtiapine (SEROQUEL) 50 MG tablet, Take 150 mg by mouth at bedtime.  Review of Systems:  Review of system negative unless stated in the problem list or HPI.    Physical Exam:  There were no vitals filed for this visit.  Physical Exam General: NAD HENT: NCAT Lungs: CTAB, no wheeze, rhonchi or rales.  Cardiovascular: Normal heart sounds, no r/m/g, 2+ pulses in all extremities. No LE edema Abdomen: No TTP, normal bowel sounds MSK: No asymmetry or muscle atrophy.  Skin: no lesions noted on exposed skin Neuro: Alert and oriented x4. CN grossly intact Psych: Normal mood and normal affect   Assessment & Plan:   See Encounters Tab for problem based charting.  Patient discussed with Dr. {NAMES:[id number]::"Butcher","Guilloud","Hoffman","Mullen","Narendra","Raines","Vincent"} Janet Schuller, MD   Assessment/Plan BP at goal/not at goal (< 130/80). BP in clinic today ***, HR ***. Reported home readings with SBP ***, and Dia ***. Does not monitor BP level at home; educated on the importance of it. Home medications include lisinopril 40 mg qd, and metoprolol 50 mg BID. Reports good poor medication compliance. Tolerating medication without adverse effects. Denies headaches, vision changes, lightheadedness, chest pain, SHOB, leg swelling or changes in speech. Exam benign and vitals  otherwise wnl. Last creatinine was 0.96 in 10/2021. Counseled on the importance of daily exercise, low salt diet, and weight loss. Continue *** BP log and bring to next visit  Continue lifestyle changes  Dysphagia Negative barium esophogram except mild dsymotility Thyroid shows borderline enlargement of right thyroid lobe, multinodular goiter with recommendation of biopsy vs close follow up.  Weight stable around 215 lbs  DMII A1c 6.5 in 10/2021. On  Jardiance 10 mg, insulin 25 mg qd, and metformin 1000 mg BID.  Repeat A1c today.

## 2022-03-14 ENCOUNTER — Other Ambulatory Visit: Payer: Self-pay | Admitting: Internal Medicine

## 2022-03-14 DIAGNOSIS — E1165 Type 2 diabetes mellitus with hyperglycemia: Secondary | ICD-10-CM

## 2022-03-17 NOTE — Telephone Encounter (Signed)
No recent appointment.   No future appointments .

## 2022-03-26 ENCOUNTER — Other Ambulatory Visit: Payer: Self-pay | Admitting: Student

## 2022-03-26 ENCOUNTER — Other Ambulatory Visit: Payer: Self-pay

## 2022-03-26 ENCOUNTER — Ambulatory Visit: Payer: Medicare (Managed Care)

## 2022-03-26 ENCOUNTER — Ambulatory Visit (INDEPENDENT_AMBULATORY_CARE_PROVIDER_SITE_OTHER): Payer: Medicare (Managed Care) | Admitting: Student

## 2022-03-26 VITALS — BP 158/70 | HR 56 | Temp 98.1°F | Wt 214.9 lb

## 2022-03-26 DIAGNOSIS — E1165 Type 2 diabetes mellitus with hyperglycemia: Secondary | ICD-10-CM

## 2022-03-26 DIAGNOSIS — Z79891 Long term (current) use of opiate analgesic: Secondary | ICD-10-CM

## 2022-03-26 DIAGNOSIS — E119 Type 2 diabetes mellitus without complications: Secondary | ICD-10-CM

## 2022-03-26 DIAGNOSIS — R42 Dizziness and giddiness: Secondary | ICD-10-CM

## 2022-03-26 DIAGNOSIS — Z87891 Personal history of nicotine dependence: Secondary | ICD-10-CM

## 2022-03-26 DIAGNOSIS — Z794 Long term (current) use of insulin: Secondary | ICD-10-CM

## 2022-03-26 DIAGNOSIS — I10 Essential (primary) hypertension: Secondary | ICD-10-CM | POA: Diagnosis not present

## 2022-03-26 LAB — POCT GLYCOSYLATED HEMOGLOBIN (HGB A1C): Hemoglobin A1C: 6.3 % — AB (ref 4.0–5.6)

## 2022-03-26 LAB — GLUCOSE, CAPILLARY: Glucose-Capillary: 93 mg/dL (ref 70–99)

## 2022-03-26 MED ORDER — LISINOPRIL 40 MG PO TABS: 40.0000 mg | ORAL_TABLET | Freq: Every day | ORAL | 3 refills | Status: DC

## 2022-03-26 MED ORDER — LEVEMIR FLEXTOUCH 100 UNIT/ML ~~LOC~~ SOPN: 15.0000 [IU] | PEN_INJECTOR | Freq: Every day | SUBCUTANEOUS | 1 refills | Status: DC

## 2022-03-26 MED ORDER — MECLIZINE HCL 12.5 MG PO TABS: ORAL_TABLET | ORAL | 0 refills | Status: DC

## 2022-03-26 MED ORDER — EMPAGLIFLOZIN 25 MG PO TABS: 25.0000 mg | ORAL_TABLET | Freq: Every day | ORAL | 3 refills | Status: DC

## 2022-03-26 MED ORDER — TRAMADOL HCL 50 MG PO TABS: 50.0000 mg | ORAL_TABLET | Freq: Two times a day (BID) | ORAL | 0 refills | Status: DC | PRN

## 2022-03-26 MED ORDER — LEVEMIR FLEXTOUCH 100 UNIT/ML ~~LOC~~ SOPN
15.0000 [IU] | PEN_INJECTOR | Freq: Every day | SUBCUTANEOUS | 1 refills | Status: DC
Start: 2022-03-26 — End: 2023-01-11

## 2022-03-26 NOTE — Assessment & Plan Note (Addendum)
Refilled her tramadol today which she has been taking for chronic back pain, and atypical chest wall pain.  Addendum: [facility] is out of tramadol at the point, tried contacting patient twice on where to send prescription.

## 2022-03-26 NOTE — Assessment & Plan Note (Signed)
Patient's A1c today was 6.2, which is marginally lower than her previous A1c in February which was 6.5.  She denies any tingling, numbness, but does complain of some dizziness from time to time.  She was not able to bring her glucose monitor in the clinic today, however her morning glucose was 93, and she usually ranges from 120-130.  She is currently on Jardiance 10 mg, metformin at 1000 mg twice daily, and Levemir 25 units daily.  Foot exam was done today, no ulcerations or blisters on feet, distal pulses were palpated +2.  Plan:  1.  Due to patient's age, and in recent blood sugars, she may be at risk for hypoglycemia.  In order to combat this, we changed her Levemir to 15 units daily, and increase her Jardiance to 25 mg.  2.  Microalbumin/creatinine urine test was done today, has not been done since 2020.,  Will update patient when results come back.  3.  Place ophthalmology referral.

## 2022-03-26 NOTE — Assessment & Plan Note (Signed)
Pt is on Meclizine 12.'5mg'$  for vertigo  -Refilled today

## 2022-03-26 NOTE — Assessment & Plan Note (Addendum)
Blood pressure today was 156/61, on recheck was 158/70.  Patient does not check blood pressure at home.  She states she did not take her medication this morning for her blood pressure. She denies any blurry vision, but is positive for dizziness and chest pain for other problems (vertigo and chronic atypical chest wall pain). Patient was taken off hydrochlorothiazide and potassium supplements at last visit in February, and is currently on metoprolol 50 mg and lisinopril 40 mg.  Plan:  1.  BMP was done today due to recent change in medication, will call patient's upon results 2.  Encouraged patient to check blood pressure at home and keep a daily log and bring in to clinic at next visit. 3.  Since patient did not take medication this morning and is difficult to tell if the hypertension is essential or  Resistant.

## 2022-03-26 NOTE — Progress Notes (Signed)
CC: T2DM and HTN followup  HPI:  [name] is a 76 y.o. female living with a history stated below and presents today for T2DM and HTN followup. Please see problem based assessment and plan for additional details.  Past Medical History:  Diagnosis Date   Atypical nevus    Breast cancer ([place]) 07/14/06   Left breast. Ductal carcinoma, grade 3. Followed by Dr. Truddie Coco. s/p lumpectomy with radiation   Dental caries    Diabetes mellitus    DVT (deep vein thrombosis) in pregnancy 1982   Affected the RLE. Required coumadin.   Fibroids    GERD (gastroesophageal reflux disease)    Goiter    U/S 08/2009-persistent multiple small nodules, one in the right lower pole slightly enlarged, recommend continued followup.   History of mitral valve prolapse    Hyperlipidemia    Hypertension    Ovarian cyst    Personal history of radiation therapy    Schizophrenia ([place])     Current Outpatient Medications on File Prior to Visit  Medication Sig Dispense Refill   aspirin 81 MG tablet Take 1 tablet (81 mg total) by mouth daily. 30 tablet 11   diclofenac Sodium (VOLTAREN) 1 % GEL Apply 2 g topically 4 (four) times daily. 50 g 1   metFORMIN (GLUCOPHAGE) 1000 MG tablet TAKE 1 TABLET BY MOUTH TWICE DAILY WITH MEALS 180 tablet 2   metoprolol tartrate (LOPRESSOR) 50 MG tablet TAKE 3 TABLETS BY MOUTH TWICE DAILY 540 tablet 0   OLANZapine (ZYPREXA) 10 MG tablet Take 30 mg by mouth at bedtime.      pantoprazole (PROTONIX) 40 MG tablet Take 1 tablet (40 mg total) by mouth daily. 90 tablet 2   pravastatin (PRAVACHOL) 40 MG tablet Take 1 tablet by mouth once daily 90 tablet 0   QUEtiapine (SEROQUEL) 50 MG tablet Take 150 mg by mouth at bedtime.     traMADol (ULTRAM) 50 MG tablet TAKE 1 TABLET BY MOUTH EVERY 12 HOURS AS NEEDED 60 tablet 0   No current facility-administered medications on file prior to visit.    Family History  Problem Relation Age of Onset   Diabetes Mother    Diabetes Father      Social History   Socioeconomic History   Marital status: Widowed    Spouse name: Not on file   Number of children: Not on file   Years of education: Not on file   Highest education level: Not on file  Occupational History   Not on file  Tobacco Use   Smoking status: Former   Smokeless tobacco: Never   Tobacco comments:    quit 9 yrs ago.  Substance and Sexual Activity   Alcohol use: No    Alcohol/week: 0.0 standard drinks of alcohol   Drug use: No   Sexual activity: Not on file  Other Topics Concern   Not on file  Social History Narrative   Widow.   Used to work as a Quarry manager, now retired.         Social Determinants of Health   Financial Resource Strain: Not on file  Food Insecurity: Not on file  Transportation Needs: Not on file  Physical Activity: Not on file  Stress: Not on file  Social Connections: Not on file  Intimate Partner Violence: Not on file    Review of Systems: ROS negative except for what is noted on the assessment and plan.  Vitals:   03/26/22 0855 03/26/22 0954  BP: Marland Kitchen)  156/61 (!) 158/70  Pulse: 60 (!) 56  Temp: 98.1 F (36.7 C)   TempSrc: Oral   SpO2: 100%   Weight: 214 lb 14.4 oz (97.5 kg)     Physical Exam: Constitutional: well-appearing woman sitting comfortably, in no acute distress Cardiovascular: regular rate and rhythm, no m/r/g, distal pulses +2 Pulmonary/Chest: normal work of breathing on room air, lungs clear to auscultation bilaterally MSK: normal bulk and tone, no blisters or ulcers on feet Neurological: alert & oriented x 3, 5/5 strength in bilateral upper and lower extremities, normal gait Skin: warm and dry   Assessment & Plan:   Type 2 diabetes mellitus (HCC) Patient's A1c today was 6.2, which is marginally lower than her previous A1c in February which was 6.5.  She denies any tingling, numbness, but does complain of some dizziness from time to time.  She was not able to bring her glucose monitor in the clinic today,  however her morning glucose was 93, and she usually ranges from 120-130.  She is currently on Jardiance 10 mg, metformin at 1000 mg twice daily, and Levemir 25 units daily.  Foot exam was done today, no ulcerations or blisters on feet, distal pulses were palpated +2.  Plan:  1.  Due to patient's age, and in recent blood sugars, she may be at risk for hypoglycemia.  In order to combat this, we changed her Levemir to 15 units daily, and increase her Jardiance to 25 mg.  2.  Microalbumin/creatinine urine test was done today, has not been done since 2020.,  Will update patient when results come back.  3.  Place ophthalmology referral.  Essential hypertension Blood pressure today was 156/61, on recheck was 158/70.  Patient does not check blood pressure at home.  She states she did not take her medication this morning for her blood pressure. She denies any blurry vision, but is positive for dizziness and chest pain for other problems (vertigo and chronic atypical chest wall pain). Patient was taken off hydrochlorothiazide and potassium supplements at last visit in February, and is currently on metoprolol 50 mg and lisinopril 40 mg.  Plan:  1.  BMP was done today due to recent change in medication, will call patient's upon results 2.  Encouraged patient to check blood pressure at home and keep a daily log and bring in to clinic at next visit. 3.  Since patient did not take medication this morning and is difficult to tell if the hypertension is essential or  Resistant.   Long term prescription opiate use Refilled her tramadol today which she has been taking for chronic back pain, and atypical chest wall pain.  Vertigo Pt is on Meclizine 12.'5mg'$  for vertigo  -Refilled today  Patient seen with Dr. Kelle Darting Taurus Alamo, M.D. [facility], PGY-1 Phone: [phone] Date 03/26/2022 Time 10:28 AM

## 2022-03-26 NOTE — Patient Instructions (Signed)
Thank you so much for coming to the clinic today!  We talked about a few things, hears a quick summary:  1.  As we get older, or more likely to develop low blood sugars, which could lead to dizziness.  In order to combat that a little bit we are changing your insulin to 15 units instead of 25, and also increasing her Jardiance to 25 mg.  We are also getting some lab work done today, and a urine sample.  2.  Your blood pressure was a little bit high today 156/61, however since you did not take your medication today or not can make any changes as of yet.  Or getting some blood work done to check out how your body is reacting to not having that other medication you are taking (hydrochlorothiazide).  Please remember to keep track your blood pressure at home, and when I call you to update you on the results I left in no some readings.  Again thank you so much for coming in today, and if you have any questions please call [phone].  Dr. Marlene Lard Vondell Babers

## 2022-03-26 NOTE — Telephone Encounter (Signed)
Called pharmacy who note they are out of tramadol 50 and prescription will need to be sent to another pharmacy. Dr. Sanjuana Mae tried calling the patient x3 without response.

## 2022-03-27 LAB — BMP8+ANION GAP
Anion Gap: 15 mmol/L (ref 10.0–18.0)
BUN/Creatinine Ratio: 11 — ABNORMAL LOW (ref 12–28)
BUN: 8 mg/dL (ref 8–27)
CO2: 22 mmol/L (ref 20–29)
Calcium: 9.1 mg/dL (ref 8.7–10.3)
Chloride: 100 mmol/L (ref 96–106)
Creatinine, Ser: 0.72 mg/dL (ref 0.57–1.00)
Glucose: 91 mg/dL (ref 70–99)
Potassium: 4.4 mmol/L (ref 3.5–5.2)
Sodium: 137 mmol/L (ref 134–144)
eGFR: 88 mL/min/{1.73_m2} (ref >59)

## 2022-03-27 LAB — MICROALBUMIN / CREATININE URINE RATIO
Creatinine, Urine: 52.9 mg/dL
Microalb/Creat Ratio: 13 mg/g creat (ref 0–29)
Microalbumin, Urine: 6.7 ug/mL

## 2022-03-27 MED ORDER — TRAMADOL HCL 50 MG PO TABS: 50.0000 mg | ORAL_TABLET | Freq: Two times a day (BID) | ORAL | 0 refills | Status: DC | PRN

## 2022-03-27 NOTE — Telephone Encounter (Signed)
Spoke with patient's daughter on the phone, will send Tramadol to new pharamcy ([facility] on [address]). Also discussed lab results which was normal.

## 2022-03-30 NOTE — Progress Notes (Signed)
Internal Medicine Clinic Attending  I saw and evaluated the patient.  I personally confirmed the key portions of the history and exam documented by Dr. Nooruddin and I reviewed pertinent patient test results.  The assessment, diagnosis, and plan were formulated together and I agree with the documentation in the resident's note.  

## 2022-05-07 ENCOUNTER — Other Ambulatory Visit: Payer: Self-pay | Admitting: Student

## 2022-05-07 DIAGNOSIS — Z79891 Long term (current) use of opiate analgesic: Secondary | ICD-10-CM

## 2022-06-09 ENCOUNTER — Other Ambulatory Visit: Payer: Self-pay | Admitting: Internal Medicine

## 2022-06-09 DIAGNOSIS — I10 Essential (primary) hypertension: Secondary | ICD-10-CM

## 2022-07-13 ENCOUNTER — Other Ambulatory Visit: Payer: Self-pay | Admitting: Student

## 2022-07-13 DIAGNOSIS — Z79891 Long term (current) use of opiate analgesic: Secondary | ICD-10-CM

## 2022-07-17 NOTE — Telephone Encounter (Signed)
Can someone on [facility] refill this if appropriate?

## 2022-09-28 ENCOUNTER — Other Ambulatory Visit: Payer: Self-pay | Admitting: Student

## 2022-09-28 DIAGNOSIS — I10 Essential (primary) hypertension: Secondary | ICD-10-CM

## 2022-09-28 DIAGNOSIS — Z79891 Long term (current) use of opiate analgesic: Secondary | ICD-10-CM

## 2022-10-06 ENCOUNTER — Other Ambulatory Visit: Payer: Self-pay | Admitting: Internal Medicine

## 2022-10-06 ENCOUNTER — Other Ambulatory Visit: Payer: Self-pay | Admitting: Student

## 2022-10-06 DIAGNOSIS — K219 Gastro-esophageal reflux disease without esophagitis: Secondary | ICD-10-CM

## 2022-10-21 ENCOUNTER — Encounter: Payer: Medicare (Managed Care) | Admitting: Student

## 2022-10-21 ENCOUNTER — Ambulatory Visit: Payer: Medicare (Managed Care)

## 2022-10-28 ENCOUNTER — Ambulatory Visit (INDEPENDENT_AMBULATORY_CARE_PROVIDER_SITE_OTHER): Payer: Medicare (Managed Care)

## 2022-10-28 ENCOUNTER — Ambulatory Visit (INDEPENDENT_AMBULATORY_CARE_PROVIDER_SITE_OTHER): Payer: Medicare (Managed Care) | Admitting: Student

## 2022-10-28 ENCOUNTER — Encounter: Payer: Self-pay | Admitting: Student

## 2022-10-28 VITALS — BP 117/57 | HR 76 | Temp 97.7°F | Ht 70.0 in | Wt 213.3 lb

## 2022-10-28 VITALS — BP 164/108 | HR 78 | Temp 97.7°F | Ht 70.0 in | Wt 213.3 lb

## 2022-10-28 DIAGNOSIS — Z0000 Encounter for general adult medical examination without abnormal findings: Secondary | ICD-10-CM | POA: Diagnosis not present

## 2022-10-28 DIAGNOSIS — E118 Type 2 diabetes mellitus with unspecified complications: Secondary | ICD-10-CM | POA: Diagnosis not present

## 2022-10-28 DIAGNOSIS — Z87891 Personal history of nicotine dependence: Secondary | ICD-10-CM

## 2022-10-28 DIAGNOSIS — I10 Essential (primary) hypertension: Secondary | ICD-10-CM

## 2022-10-28 DIAGNOSIS — M545 Low back pain, unspecified: Secondary | ICD-10-CM | POA: Diagnosis not present

## 2022-10-28 DIAGNOSIS — E119 Type 2 diabetes mellitus without complications: Secondary | ICD-10-CM

## 2022-10-28 DIAGNOSIS — Z794 Long term (current) use of insulin: Secondary | ICD-10-CM

## 2022-10-28 DIAGNOSIS — Z79891 Long term (current) use of opiate analgesic: Secondary | ICD-10-CM

## 2022-10-28 DIAGNOSIS — Z7984 Long term (current) use of oral hypoglycemic drugs: Secondary | ICD-10-CM | POA: Diagnosis not present

## 2022-10-28 LAB — POCT GLYCOSYLATED HEMOGLOBIN (HGB A1C): Hemoglobin A1C: 6.8 % — AB (ref 4.0–5.6)

## 2022-10-28 LAB — GLUCOSE, CAPILLARY: Glucose-Capillary: 83 mg/dL (ref 70–99)

## 2022-10-28 MED ORDER — DICLOFENAC SODIUM 1 % EX GEL: 2.0000 g | Freq: Four times a day (QID) | CUTANEOUS | 1 refills | Status: DC

## 2022-10-28 MED ORDER — TRAMADOL HCL 50 MG PO TABS: 50.0000 mg | ORAL_TABLET | Freq: Two times a day (BID) | ORAL | 0 refills | Status: DC | PRN

## 2022-10-28 NOTE — Progress Notes (Signed)
CC: A1c check  HPI:  [name] is a 77 y.o. female living with a history stated below and presents today for A1c check. Please see problem based assessment and plan for additional details.  Past Medical History:  Diagnosis Date   Atypical nevus    Breast cancer ([place]) 07/14/06   Left breast. Ductal carcinoma, grade 3. Followed by Dr. Truddie Coco. s/p lumpectomy with radiation   Dental caries    Diabetes mellitus    DVT (deep vein thrombosis) in pregnancy 1982   Affected the RLE. Required coumadin.   Fibroids    GERD (gastroesophageal reflux disease)    Goiter    U/S 08/2009-persistent multiple small nodules, one in the right lower pole slightly enlarged, recommend continued followup.   History of mitral valve prolapse    Hyperlipidemia    Hypertension    Ovarian cyst    Personal history of radiation therapy    Schizophrenia ([place])     Current Outpatient Medications on File Prior to Visit  Medication Sig Dispense Refill   aspirin 81 MG tablet Take 1 tablet (81 mg total) by mouth daily. 30 tablet 11   empagliflozin (JARDIANCE) 25 MG TABS tablet Take 1 tablet (25 mg total) by mouth daily. 60 tablet 3   insulin detemir (LEVEMIR FLEXTOUCH) 100 UNIT/ML FlexPen Inject 15 Units into the skin at bedtime. 45 mL 1   lisinopril (ZESTRIL) 40 MG tablet Take 1 tablet (40 mg total) by mouth daily. 90 tablet 3   meclizine (ANTIVERT) 12.5 MG tablet TAKE 1 TABLET BY MOUTH TWICE DAILY AS NEEDED FOR DIZZINESS 60 tablet 0   metFORMIN (GLUCOPHAGE) 1000 MG tablet TAKE 1 TABLET BY MOUTH TWICE DAILY WITH MEALS 180 tablet 2   metoprolol tartrate (LOPRESSOR) 50 MG tablet TAKE 3 TABLETS BY MOUTH TWICE DAILY 540 tablet 0   OLANZapine (ZYPREXA) 10 MG tablet Take 30 mg by mouth at bedtime.      pantoprazole (PROTONIX) 40 MG tablet Take 1 tablet by mouth once daily 90 tablet 0   pravastatin (PRAVACHOL) 40 MG tablet Take 1 tablet by mouth once daily 90 tablet 0   QUEtiapine (SEROQUEL) 50 MG tablet Take 150  mg by mouth at bedtime.     No current facility-administered medications on file prior to visit.    Family History  Problem Relation Age of Onset   Diabetes Mother    Diabetes Father     Social History   Socioeconomic History   Marital status: Widowed    Spouse name: Not on file   Number of children: Not on file   Years of education: Not on file   Highest education level: Not on file  Occupational History   Not on file  Tobacco Use   Smoking status: Former   Smokeless tobacco: Never   Tobacco comments:    quit 9 yrs ago.  Substance and Sexual Activity   Alcohol use: No    Alcohol/week: 0.0 standard drinks of alcohol   Drug use: No   Sexual activity: Not on file  Other Topics Concern   Not on file  Social History Narrative   Widow.   Used to work as a Quarry manager, now retired.         Social Determinants of Health   Financial Resource Strain: Low Risk  (10/28/2022)   Overall Financial Resource Strain (CARDIA)    Difficulty of Paying Living Expenses: Not hard at all  Food Insecurity: No Food Insecurity (10/28/2022)   Hunger  Vital Sign    Worried About Charity fundraiser in the Last Year: Never true    Ran Out of Food in the Last Year: Never true  Transportation Needs: No Transportation Needs (10/28/2022)   PRAPARE - Hydrologist (Medical): No    Lack of Transportation (Non-Medical): No  Physical Activity: Inactive (10/28/2022)   Exercise Vital Sign    Days of Exercise per Week: 0 days    Minutes of Exercise per Session: 0 min  Stress: No Stress Concern Present (10/28/2022)   [place]    Feeling of Stress : Not at all  Social Connections: Socially Isolated (10/28/2022)   Social Connection and Isolation Panel [NHANES]    Frequency of Communication with Friends and Family: Once a week    Frequency of Social Gatherings with Friends and Family: Once a week    Attends Religious  Services: Never    Marine scientist or Organizations: No    Attends Archivist Meetings: Never    Marital Status: Widowed  Intimate Partner Violence: Not At Risk (10/28/2022)   Humiliation, Afraid, Rape, and Kick questionnaire    Fear of Current or Ex-Partner: No    Emotionally Abused: No    Physically Abused: No    Sexually Abused: No    Review of Systems: ROS negative except for what is noted on the assessment and plan.  Vitals:   10/28/22 1044 10/28/22 1127  BP: (!) 164/108 (!) 117/57  Pulse: 78 76  Temp: 97.7 F (36.5 C)   TempSrc: Oral   SpO2: 98%   Weight: 213 lb 4.8 oz (96.8 kg)   Height: 5' 10"$  (1.778 m)     Physical Exam: Constitutional: well-appearing female sitting in chair, in no acute distress HENT: normocephalic atraumatic, mucous membranes moist Eyes: conjunctiva non-erythematous Neck: supple Cardiovascular: regular rate and rhythm, no m/r/g Pulmonary/Chest: normal work of breathing on room air, lungs clear to auscultation bilaterally Abdominal: soft, non-tender, non-distended MSK: normal bulk and tone, tenderness to palpation on left lower lumbar area.  No erythema or effusions felt   Assessment & Plan:   Back pain Patient complaining of back pain today.  She says she woke up started feeling pain.  Its located on the left lower side in the lumbar area, it is nonradiating.  She denies any red flag symptoms such as fevers, trouble urinating or having bowel movements.  She says she intermittently has this pain, and has been using Voltaren gel and that usually helps.  On physical exam, pain is reproducible by palpation.  I believe this pain to be more MSK related  Plan: -.  Will send in Voltaren gel for patient, and if that does not provide relief can consider trial of NSAIDs  Type 2 diabetes mellitus ([place]) Patient presents for 67-monthA1c check.  A1c at this time is 6.8, slightly increased from 6.3.  Regimen currently includes 15 units of  Levemir daily metformin at 1000 mg and Jardiance 25 mg.  Patient denies any polyuria or polydipsia at this time.  Will keep current regimen, and monitor close follow-up and see if insulin adjustments need to be made at next visit.  Plan: - Continue to monitor - Continue current regimen  Essential hypertension Patient's initial blood pressure was 164/108, however on repeat was 117/57.  She denies any cardiac chest pain, shortness of breath, blurry vision.  Her current regimen is lisinopril 40 mg  and Lopressor 50 mg.  Will continue.  Patient discussed with Dr. Thomasene Ripple, M.D. [facility], PGY-1 Phone: [phone] Date 10/28/2022 Time 4:03 PM

## 2022-10-28 NOTE — Assessment & Plan Note (Signed)
Patient complaining of back pain today.  She says she woke up started feeling pain.  Its located on the left lower side in the lumbar area, it is nonradiating.  She denies any red flag symptoms such as fevers, trouble urinating or having bowel movements.  She says she intermittently has this pain, and has been using Voltaren gel and that usually helps.  On physical exam, pain is reproducible by palpation.  I believe this pain to be more MSK related  Plan: -.  Will send in Voltaren gel for patient, and if that does not provide relief can consider trial of NSAIDs

## 2022-10-28 NOTE — Assessment & Plan Note (Signed)
Patient presents for 81-monthA1c check.  A1c at this time is 6.8, slightly increased from 6.3.  Regimen currently includes 15 units of Levemir daily metformin at 1000 mg and Jardiance 25 mg.  Patient denies any polyuria or polydipsia at this time.  Will keep current regimen, and monitor close follow-up and see if insulin adjustments need to be made at next visit.  Plan: - Continue to monitor - Continue current regimen

## 2022-10-28 NOTE — Assessment & Plan Note (Signed)
Patient's initial blood pressure was 164/108, however on repeat was 117/57.  She denies any cardiac chest pain, shortness of breath, blurry vision.  Her current regimen is lisinopril 40 mg and Lopressor 50 mg.  Will continue.

## 2022-10-28 NOTE — Progress Notes (Signed)
Subjective:   [name] is a 77 y.o. female who presents for an Initial Medicare Annual Wellness Visit. I connected with  Nolon Bussing on 10/28/22 by a  Face-To-Face  enabled telemedicine application and verified that I am speaking with the correct person using two identifiers.  Patient Location: Other:  Office/Clinic  Provider Location: Office/Clinic  I discussed the limitations of evaluation and management by telemedicine. The patient expressed understanding and agreed to proceed.  Review of Systems    Defer to PCP       Objective:    Today's Vitals   10/28/22 1213 10/28/22 1214  BP: (!) 164/108   Pulse: 78   Temp: 97.7 F (36.5 C)   TempSrc: Oral   SpO2: 98%   Weight: 213 lb 4.8 oz (96.8 kg)   Height: 5' 10"$  (1.778 m)   PainSc:  8    Body mass index is 30.61 kg/m.     10/28/2022   12:15 PM 10/28/2022   10:47 AM 11/04/2021    9:51 AM 12/16/2020    1:21 PM 04/08/2020   10:21 AM 11/20/2019    2:01 PM 10/14/2018    2:47 PM  Advanced Directives  Does Patient Have a Medical Advance Directive? No No No No No No No  Would patient like information on creating a medical advance directive? Yes (MAU/Ambulatory/Procedural Areas - Information given) No - Patient declined No - Patient declined No - Patient declined No - Patient declined No - Patient declined No - Patient declined    Current Medications (verified) Outpatient Encounter Medications as of 10/28/2022  Medication Sig   aspirin 81 MG tablet Take 1 tablet (81 mg total) by mouth daily.   empagliflozin (JARDIANCE) 25 MG TABS tablet Take 1 tablet (25 mg total) by mouth daily.   insulin detemir (LEVEMIR FLEXTOUCH) 100 UNIT/ML FlexPen Inject 15 Units into the skin at bedtime.   lisinopril (ZESTRIL) 40 MG tablet Take 1 tablet (40 mg total) by mouth daily.   meclizine (ANTIVERT) 12.5 MG tablet TAKE 1 TABLET BY MOUTH TWICE DAILY AS NEEDED FOR DIZZINESS   metFORMIN (GLUCOPHAGE) 1000 MG tablet TAKE 1 TABLET BY MOUTH TWICE  DAILY WITH MEALS   metoprolol tartrate (LOPRESSOR) 50 MG tablet TAKE 3 TABLETS BY MOUTH TWICE DAILY   OLANZapine (ZYPREXA) 10 MG tablet Take 30 mg by mouth at bedtime.    pantoprazole (PROTONIX) 40 MG tablet Take 1 tablet by mouth once daily   pravastatin (PRAVACHOL) 40 MG tablet Take 1 tablet by mouth once daily   QUEtiapine (SEROQUEL) 50 MG tablet Take 150 mg by mouth at bedtime.   traMADol (ULTRAM) 50 MG tablet Take 1 tablet (50 mg total) by mouth every 12 (twelve) hours as needed.   No facility-administered encounter medications on file as of 10/28/2022.    Allergies (verified) Penicillins   History: Past Medical History:  Diagnosis Date   Atypical nevus    Breast cancer ([place]) 07/14/06   Left breast. Ductal carcinoma, grade 3. Followed by Dr. Truddie Coco. s/p lumpectomy with radiation   Dental caries    Diabetes mellitus    DVT (deep vein thrombosis) in pregnancy 1982   Affected the RLE. Required coumadin.   Fibroids    GERD (gastroesophageal reflux disease)    Goiter    U/S 08/2009-persistent multiple small nodules, one in the right lower pole slightly enlarged, recommend continued followup.   History of mitral valve prolapse    Hyperlipidemia    Hypertension  Ovarian cyst    Personal history of radiation therapy    Schizophrenia [facility])    Past Surgical History:  Procedure Laterality Date   BREAST BIOPSY Left 2007   US Guided Core    BREAST LUMPECTOMY Left 2007   lumpectory Left    TONSILLECTOMY     TUBAL LIGATION     TUBAL LIGATION REVERSAL     VAGINA SURGERY     Family History  Problem Relation Age of Onset   Diabetes Mother    Diabetes Father    Social History   Socioeconomic History   Marital status: Widowed    Spouse name: Not on file   Number of children: Not on file   Years of education: Not on file   Highest education level: Not on file  Occupational History   Not on file  Tobacco Use   Smoking status: Former   Smokeless tobacco: Never   Tobacco  comments:    quit 9 yrs ago.  Substance and Sexual Activity   Alcohol use: No    Alcohol/week: 0.0 standard drinks of alcohol   Drug use: No   Sexual activity: Not on file  Other Topics Concern   Not on file  Social History Narrative   Widow.   Used to work as a Quarry manager, now retired.         Social Determinants of Health   Financial Resource Strain: Low Risk  (10/28/2022)   Overall Financial Resource Strain (CARDIA)    Difficulty of Paying Living Expenses: Not hard at all  Food Insecurity: No Food Insecurity (10/28/2022)   Hunger Vital Sign    Worried About Running Out of Food in the Last Year: Never true    Ran Out of Food in the Last Year: Never true  Transportation Needs: No Transportation Needs (10/28/2022)   PRAPARE - Hydrologist (Medical): No    Lack of Transportation (Non-Medical): No  Physical Activity: Inactive (10/28/2022)   Exercise Vital Sign    Days of Exercise per Week: 0 days    Minutes of Exercise per Session: 0 min  Stress: No Stress Concern Present (10/28/2022)   [place]    Feeling of Stress : Not at all  Social Connections: Socially Isolated (10/28/2022)   Social Connection and Isolation Panel [NHANES]    Frequency of Communication with Friends and Family: Once a week    Frequency of Social Gatherings with Friends and Family: Once a week    Attends Religious Services: Never    Marine scientist or Organizations: No    Attends Archivist Meetings: Never    Marital Status: Widowed    Tobacco Counseling Counseling given: Not Answered Tobacco comments: quit 9 yrs ago.   Clinical Intake:  Pre-visit preparation completed: Yes  Pain : 0-10 Pain Score: 8  Pain Type: Chronic pain Pain Location: Back (back and chest) Pain Descriptors / Indicators: Throbbing Pain Onset: More than a month ago Pain Frequency: Intermittent Pain Relieving Factors:  tramadol  Pain Relieving Factors: tramadol  Nutritional Risks: None Diabetes: Yes CBG done?: Yes CBG resulted in Enter/ Edit results?: Yes Did pt. bring in CBG monitor from home?: No  How often do you need to have someone help you when you read instructions, pamphlets, or other written materials from your doctor or pharmacy?: 1 - Never What is the last grade level you completed in school?: 11th grade  Diabetic?Nutrition  Risk Assessment:  Has the patient had any N/V/D within the last 2 months?  No  Does the patient have any non-healing wounds?  No  Has the patient had any unintentional weight loss or weight gain?  No   Diabetes:  Is the patient diabetic?  Yes  If diabetic, was a CBG obtained today?  Yes  Did the patient bring in their glucometer from home?  No  How often do you monitor your CBG's? Every 3 months.   Financial Strains and Diabetes Management:  Are you having any financial strains with the device, your supplies or your medication? No .  Does the patient want to be seen by Chronic Care Management for management of their diabetes?  No  Would the patient like to be referred to a Nutritionist or for Diabetic Management?  No   Diabetic Exams:  Diabetic Eye Exam: Overdue for diabetic eye exam. Pt has been advised about the importance in completing this exam. Patient advised to call and schedule an eye exam. Diabetic Foot Exam: Completed 11/04/2022    Interpreter Needed?: No  Information entered by :: Bryndon Cumbie,cma 10/28/22 12:15pm   Activities of Daily Living    10/28/2022   12:15 PM 10/28/2022   10:47 AM  In your present state of health, do you have any difficulty performing the following activities:  Hearing? 0 0  Vision? 0 0  Difficulty concentrating or making decisions? 0 0  Walking or climbing stairs? 0 0  Dressing or bathing? 0 0  Doing errands, shopping? 0 0    Patient Care Team: Drucie Opitz, MD as PCP - General  Indicate any recent Medical  Services you may have received from other than [facility] providers in the past year (date may be approximate).     Assessment:   This is a routine wellness examination for [place].  Hearing/Vision screen No results found.  Dietary issues and exercise activities discussed:     Goals Addressed   None   Depression Screen    10/28/2022   12:15 PM 10/28/2022   10:47 AM 11/04/2021   11:49 AM 12/16/2020    1:20 PM 04/08/2020   10:22 AM 11/20/2019    2:00 PM 04/24/2019   10:41 AM  PHQ 2/9 Scores  PHQ - 2 Score 0 0 0  0 0 0  PHQ- 9 Score     0  1  Exception Documentation    Other- indicate reason in comment box     Not completed    pt stated nothing has changed and did not want to answer questions again       Fall Risk    10/28/2022   12:15 PM 10/28/2022   10:47 AM 03/26/2022    8:54 AM 11/04/2021    9:51 AM 12/16/2020    1:19 PM  Fall Risk   Falls in the past year? 0 0  0 0  Number falls in past yr: 0 0 0 0   Injury with Fall? 0 0 0 0   Risk for fall due to : No Fall Risks No Fall Risks     Follow up Falls evaluation completed;Falls prevention discussed Falls evaluation completed;Falls prevention discussed Falls evaluation completed Falls evaluation completed     FALL RISK PREVENTION PERTAINING TO THE HOME:  Any stairs in or around the home? Yes  If so, are there any without handrails? No  Home free of loose throw rugs in walkways, pet beds, electrical cords, etc? Yes  Adequate  lighting in your home to reduce risk of falls? Yes   ASSISTIVE DEVICES UTILIZED TO PREVENT FALLS:  Life alert? No  Use of a cane, walker or w/c? No  Grab bars in the bathroom? No  Shower chair or bench in shower? No  Elevated toilet seat or a handicapped toilet? No   TIMED UP AND GO:  Was the test performed? Yes .  Length of time to ambulate 10 feet: 1 min.   Gait slow and steady without use of assistive device  Cognitive Function:        10/28/2022   12:16 PM  6CIT Screen  What Year? 0 points   What month? 0 points  What time? 0 points  Count back from 20 0 points  Months in reverse 0 points  Repeat phrase 0 points  Total Score 0 points    Immunizations There is no immunization history for the selected administration types on file for this patient.  TDAP status: Due, Education has been provided regarding the importance of this vaccine. Advised may receive this vaccine at local pharmacy or Health Dept. Aware to provide a copy of the vaccination record if obtained from local pharmacy or Health Dept. Verbalized acceptance and understanding.  Flu Vaccine status: Due, Education has been provided regarding the importance of this vaccine. Advised may receive this vaccine at local pharmacy or Health Dept. Aware to provide a copy of the vaccination record if obtained from local pharmacy or Health Dept. Verbalized acceptance and understanding.  Pneumococcal vaccine status: Due, Education has been provided regarding the importance of this vaccine. Advised may receive this vaccine at local pharmacy or Health Dept. Aware to provide a copy of the vaccination record if obtained from local pharmacy or Health Dept. Verbalized acceptance and understanding.  Covid-19 vaccine status: Information provided on how to obtain vaccines.   Qualifies for Shingles Vaccine? No   Zostavax completed No   Shingrix Completed?: No.    Education has been provided regarding the importance of this vaccine. Patient has been advised to call insurance company to determine out of pocket expense if they have not yet received this vaccine. Advised may also receive vaccine at local pharmacy or Health Dept. Verbalized acceptance and understanding.  Screening Tests Health Maintenance  Topic Date Due   COVID-19 Vaccine (1) Never done   DTaP/Tdap/Td (1 - Tdap) Never done   Fecal DNA (Cologuard)  Never done   Zoster Vaccines- Shingrix (1 of 2) Never done   OPHTHALMOLOGY EXAM  09/10/2016   INFLUENZA VACCINE  04/07/2022    Pneumonia Vaccine 69+ Years old (1 of 2 - PCV) 03/27/2023 (Originally 08/24/1952)   FOOT EXAM  11/04/2022   HEMOGLOBIN A1C  01/26/2023   Diabetic kidney evaluation - eGFR measurement  03/27/2023   Diabetic kidney evaluation - Urine ACR  03/27/2023   Medicare Annual Wellness (AWV)  10/29/2023   DEXA SCAN  Completed   Hepatitis C Screening  Completed   HPV VACCINES  Aged Out   LIPID PANEL  Discontinued    Health Maintenance  Health Maintenance Due  Topic Date Due   COVID-19 Vaccine (1) Never done   DTaP/Tdap/Td (1 - Tdap) Never done   Fecal DNA (Cologuard)  Never done   Zoster Vaccines- Shingrix (1 of 2) Never done   OPHTHALMOLOGY EXAM  09/10/2016   INFLUENZA VACCINE  04/07/2022       Bone Density status: Completed 12/27/2012. Results reflect: Bone density results: NORMAL. Repeat every 0 years.  Lung  Cancer Screening: (Low Dose CT Chest recommended if Age 62-80 years, 30 pack-year currently smoking OR have quit w/in 15years.) does not qualify.   Lung Cancer Screening Referral: N/A  Additional Screening:  Hepatitis C Screening: does not qualify; Completed 11/08/2016  Vision Screening: Recommended annual ophthalmology exams for early detection of glaucoma and other disorders of the eye. Is the patient up to date with their annual eye exam?  No  Who is the provider or what is the name of the office in which the patient attends annual eye exams?[facility]mology  If pt is not established with a provider, would they like to be referred to a provider to establish care? No .   Dental Screening: Recommended annual dental exams for proper oral hygiene  Community Resource Referral / Chronic Care Management: CRR required this visit?  No   CCM required this visit?  No      Plan:     I have personally reviewed and noted the following in the patient's chart:   Medical and social history Use of alcohol, tobacco or illicit drugs  Current medications and supplements  including opioid prescriptions. Patient is currently taking opioid prescriptions. Information provided to patient regarding non-opioid alternatives. Patient advised to discuss non-opioid treatment plan with their provider. Functional ability and status Nutritional status Physical activity Advanced directives List of other physicians Hospitalizations, surgeries, and ER visits in previous 12 months Vitals Screenings to include cognitive, depression, and falls Referrals and appointments  In addition, I have reviewed and discussed with patient certain preventive protocols, quality metrics, and best practice recommendations. A written personalized care plan for preventive services as well as general preventive health recommendations were provided to patient.     Kerin Perna, [facility]   10/28/2022   Nurse Notes: Face-To-Face Visit  Ms. Barton , Thank you for taking time to come for your Medicare Wellness Visit. I appreciate your ongoing commitment to your health goals. Please review the following plan we discussed and let me know if I can assist you in the future.   These are the goals we discussed:  Goals      Blood Pressure < 140/90     HEMOGLOBIN A1C < 7.0     LDL CALC < 100        This is a list of the screening recommended for you and due dates:  Health Maintenance  Topic Date Due   COVID-19 Vaccine (1) Never done   DTaP/Tdap/Td vaccine (1 - Tdap) Never done   Cologuard (Stool DNA test)  Never done   Zoster (Shingles) Vaccine (1 of 2) Never done   Eye exam for diabetics  09/10/2016   Flu Shot  04/07/2022   Pneumonia Vaccine (1 of 2 - PCV) 03/27/2023*   Complete foot exam   11/04/2022   Hemoglobin A1C  01/26/2023   Yearly kidney function blood test for diabetes  03/27/2023   Yearly kidney health urinalysis for diabetes  03/27/2023   Medicare Annual Wellness Visit  10/29/2023   DEXA scan (bone density measurement)  Completed   Hepatitis C Screening: USPSTF Recommendation to  screen - Ages 81-79 yo.  Completed   HPV Vaccine  Aged Out   Lipid (cholesterol) test  Discontinued  *Topic was postponed. The date shown is not the original due date.

## 2022-10-28 NOTE — Patient Instructions (Addendum)
Thank you so much for coming to the clinic today!   We are checking your A1c today, I will call you with the results. For your back pain, I sent in the voltaren gel and have refilled your tramadol.   If you have any questions please feel free to the call the clinic at anytime at [phone]. It was a pleasure seeing you!  Best, Dr. Sanjuana Mae

## 2022-10-30 NOTE — Progress Notes (Signed)
Internal Medicine Clinic Attending  Case discussed with the resident at the time of the visit.  We reviewed the resident's history and exam and pertinent patient test results.  I agree with the assessment, diagnosis, and plan of care documented in the resident's note.  

## 2022-11-10 NOTE — Progress Notes (Signed)
Internal Medicine Clinic Attending  Case and documentation reviewed.  I reviewed the AWV findings.  I agree with the assessment, diagnosis, and plan of care documented in the AWV note.   Gilles Chiquito, MD

## 2022-12-12 ENCOUNTER — Other Ambulatory Visit: Payer: Self-pay | Admitting: Student

## 2022-12-12 DIAGNOSIS — Z79891 Long term (current) use of opiate analgesic: Secondary | ICD-10-CM

## 2022-12-16 NOTE — Telephone Encounter (Signed)
Alright, I will refill it for her.

## 2022-12-27 ENCOUNTER — Other Ambulatory Visit: Payer: Self-pay | Admitting: Student

## 2022-12-27 DIAGNOSIS — E1165 Type 2 diabetes mellitus with hyperglycemia: Secondary | ICD-10-CM

## 2023-01-11 ENCOUNTER — Other Ambulatory Visit: Payer: Self-pay

## 2023-01-11 DIAGNOSIS — Z794 Long term (current) use of insulin: Secondary | ICD-10-CM

## 2023-01-13 ENCOUNTER — Other Ambulatory Visit: Payer: Self-pay | Admitting: Student

## 2023-01-13 DIAGNOSIS — Z794 Long term (current) use of insulin: Secondary | ICD-10-CM

## 2023-01-13 MED ORDER — LEVEMIR FLEXTOUCH 100 UNIT/ML ~~LOC~~ SOPN: 15.0000 [IU] | PEN_INJECTOR | Freq: Every day | SUBCUTANEOUS | 1 refills | Status: DC

## 2023-01-18 ENCOUNTER — Other Ambulatory Visit: Payer: Self-pay | Admitting: Student

## 2023-01-18 DIAGNOSIS — Z794 Long term (current) use of insulin: Secondary | ICD-10-CM

## 2023-01-19 ENCOUNTER — Other Ambulatory Visit: Payer: Self-pay | Admitting: Student

## 2023-01-19 DIAGNOSIS — Z79891 Long term (current) use of opiate analgesic: Secondary | ICD-10-CM

## 2023-01-19 DIAGNOSIS — E119 Type 2 diabetes mellitus without complications: Secondary | ICD-10-CM

## 2023-01-19 DIAGNOSIS — I10 Essential (primary) hypertension: Secondary | ICD-10-CM

## 2023-01-19 MED ORDER — TRESIBA FLEXTOUCH 100 UNIT/ML ~~LOC~~ SOPN: 15.0000 [IU] | PEN_INJECTOR | Freq: Every day | SUBCUTANEOUS | 3 refills | Status: DC

## 2023-01-19 NOTE — Progress Notes (Signed)
Insurance no longer covering Levemir, will switch to [place] 15U. Will also reach out to front desk to schedule follow up appointment for this patient for an A1c check.

## 2023-02-05 ENCOUNTER — Other Ambulatory Visit: Payer: Self-pay | Admitting: Student

## 2023-02-05 DIAGNOSIS — Z1231 Encounter for screening mammogram for malignant neoplasm of breast: Secondary | ICD-10-CM

## 2023-02-08 ENCOUNTER — Ambulatory Visit: Payer: Medicare (Managed Care)

## 2023-02-09 ENCOUNTER — Other Ambulatory Visit: Payer: Self-pay | Admitting: Student

## 2023-02-09 DIAGNOSIS — K219 Gastro-esophageal reflux disease without esophagitis: Secondary | ICD-10-CM

## 2023-02-11 ENCOUNTER — Encounter: Payer: Medicare (Managed Care) | Admitting: Student

## 2023-03-02 ENCOUNTER — Other Ambulatory Visit: Payer: Self-pay | Admitting: Student

## 2023-03-02 DIAGNOSIS — Z79891 Long term (current) use of opiate analgesic: Secondary | ICD-10-CM

## 2023-03-02 NOTE — Telephone Encounter (Signed)
LOV 10/28/22; no upcoming appt.

## 2023-03-10 ENCOUNTER — Ambulatory Visit: Payer: Medicare (Managed Care)

## 2023-03-17 ENCOUNTER — Ambulatory Visit
Admission: RE | Admit: 2023-03-17 | Discharge: 2023-03-17 | Disposition: A | Discharge status: Home / Self Care | Payer: Medicare (Managed Care) | Source: Ambulatory Visit | Attending: Internal Medicine | Admitting: Internal Medicine

## 2023-03-17 DIAGNOSIS — Z1231 Encounter for screening mammogram for malignant neoplasm of breast: Secondary | ICD-10-CM

## 2023-04-14 ENCOUNTER — Other Ambulatory Visit: Payer: Self-pay

## 2023-04-14 DIAGNOSIS — E1165 Type 2 diabetes mellitus with hyperglycemia: Secondary | ICD-10-CM

## 2023-04-15 MED ORDER — EMPAGLIFLOZIN 25 MG PO TABS: 25.0000 mg | ORAL_TABLET | Freq: Every day | ORAL | 3 refills | Status: DC

## 2023-04-23 ENCOUNTER — Other Ambulatory Visit: Payer: Self-pay | Admitting: Student

## 2023-04-23 DIAGNOSIS — Z79891 Long term (current) use of opiate analgesic: Secondary | ICD-10-CM

## 2023-04-24 ENCOUNTER — Other Ambulatory Visit: Payer: Self-pay | Admitting: Student

## 2023-04-24 DIAGNOSIS — Z79891 Long term (current) use of opiate analgesic: Secondary | ICD-10-CM

## 2023-04-24 MED ORDER — TRAMADOL HCL 50 MG PO TABS
50.0000 mg | ORAL_TABLET | Freq: Two times a day (BID) | ORAL | 0 refills | Status: DC | PRN
Start: 2023-04-24 — End: 2023-07-05

## 2023-04-24 NOTE — Progress Notes (Signed)
Paged by Pharmacy regarding this patient's tramadol prescription as the prescribing doctor DEA number was not working properly and need to resend in the prescription.  Spoke with pharmacist, Lurena Joiner on the phone, who stated that we will need to send in prescription again.  Did reorder tramadol 50 mg every 12 hours as needed for the next 30 days.  Confirm with pharmacist that they received it.  They will try to run this again to see if this will work.

## 2023-05-03 ENCOUNTER — Other Ambulatory Visit: Payer: Self-pay | Admitting: Student

## 2023-05-03 DIAGNOSIS — E1165 Type 2 diabetes mellitus with hyperglycemia: Secondary | ICD-10-CM

## 2023-05-06 ENCOUNTER — Other Ambulatory Visit: Payer: Self-pay | Admitting: Student

## 2023-05-06 DIAGNOSIS — K219 Gastro-esophageal reflux disease without esophagitis: Secondary | ICD-10-CM

## 2023-05-13 ENCOUNTER — Other Ambulatory Visit: Payer: Self-pay | Admitting: Student

## 2023-05-13 DIAGNOSIS — I10 Essential (primary) hypertension: Secondary | ICD-10-CM

## 2023-06-05 ENCOUNTER — Other Ambulatory Visit: Payer: Self-pay | Admitting: Student

## 2023-07-05 ENCOUNTER — Encounter: Payer: Self-pay | Admitting: Student

## 2023-07-05 ENCOUNTER — Other Ambulatory Visit: Payer: Self-pay

## 2023-07-05 ENCOUNTER — Ambulatory Visit (INDEPENDENT_AMBULATORY_CARE_PROVIDER_SITE_OTHER): Payer: Medicare (Managed Care) | Admitting: Student

## 2023-07-05 VITALS — BP 135/50 | HR 68 | Temp 98.2°F | Ht 70.0 in | Wt 208.0 lb

## 2023-07-05 DIAGNOSIS — I10 Essential (primary) hypertension: Secondary | ICD-10-CM

## 2023-07-05 DIAGNOSIS — Z794 Long term (current) use of insulin: Secondary | ICD-10-CM | POA: Diagnosis not present

## 2023-07-05 DIAGNOSIS — M545 Low back pain, unspecified: Secondary | ICD-10-CM

## 2023-07-05 DIAGNOSIS — E119 Type 2 diabetes mellitus without complications: Secondary | ICD-10-CM

## 2023-07-05 DIAGNOSIS — Z0000 Encounter for general adult medical examination without abnormal findings: Secondary | ICD-10-CM

## 2023-07-05 DIAGNOSIS — K869 Disease of pancreas, unspecified: Secondary | ICD-10-CM

## 2023-07-05 DIAGNOSIS — Z79891 Long term (current) use of opiate analgesic: Secondary | ICD-10-CM

## 2023-07-05 DIAGNOSIS — K862 Cyst of pancreas: Secondary | ICD-10-CM

## 2023-07-05 DIAGNOSIS — M549 Dorsalgia, unspecified: Secondary | ICD-10-CM

## 2023-07-05 DIAGNOSIS — Z7984 Long term (current) use of oral hypoglycemic drugs: Secondary | ICD-10-CM

## 2023-07-05 LAB — POCT GLYCOSYLATED HEMOGLOBIN (HGB A1C): Hemoglobin A1C: 6.8 % — AB (ref 4.0–5.6)

## 2023-07-05 LAB — GLUCOSE, CAPILLARY: Glucose-Capillary: 74 mg/dL (ref 70–99)

## 2023-07-05 MED ORDER — FREESTYLE LIBRE 3 SENSOR MISC: 5 refills | Status: AC

## 2023-07-05 MED ORDER — DICLOFENAC SODIUM 1 % EX GEL
2.0000 g | Freq: Four times a day (QID) | CUTANEOUS | 1 refills | Status: AC
Start: 2023-07-05 — End: ?

## 2023-07-05 MED ORDER — FREESTYLE LIBRE 3 READER DEVI: 5 refills | Status: AC

## 2023-07-05 MED ORDER — TRESIBA FLEXTOUCH 100 UNIT/ML ~~LOC~~ SOPN: 12.0000 [IU] | PEN_INJECTOR | Freq: Every day | SUBCUTANEOUS | 3 refills | Status: DC

## 2023-07-05 MED ORDER — LISINOPRIL 40 MG PO TABS
40.0000 mg | ORAL_TABLET | Freq: Every day | ORAL | 3 refills | Status: DC
Start: 2023-07-05 — End: 2024-03-06

## 2023-07-05 MED ORDER — TRAMADOL HCL 50 MG PO TABS: 50.0000 mg | ORAL_TABLET | Freq: Two times a day (BID) | ORAL | 0 refills | Status: DC | PRN

## 2023-07-05 NOTE — Patient Instructions (Addendum)
Thank you so much for coming to the clinic today!   For your diabetes, we are DECREASING your insulin to 12U, instead of 15. I have also placed an order for the freestyle libre, we'd like to see you back in a month and if you have any trouble with it please call Lupita Leash.   For your back pain, I have placed a referral to physical therapy, and have refilled your medications.     If you have any questions please feel free to the call the clinic at anytime at [phone]. It was a pleasure seeing you!  Best, Dr. Thomasene Ripple

## 2023-07-06 ENCOUNTER — Telehealth: Payer: Self-pay

## 2023-07-06 ENCOUNTER — Encounter: Payer: Self-pay | Admitting: Student

## 2023-07-06 LAB — BMP8+ANION GAP
Anion Gap: 17 mmol/L (ref 10.0–18.0)
BUN/Creatinine Ratio: 21 (ref 12–28)
BUN: 15 mg/dL (ref 8–27)
CO2: 22 mmol/L (ref 20–29)
Calcium: 9.2 mg/dL (ref 8.7–10.3)
Chloride: 102 mmol/L (ref 96–106)
Creatinine, Ser: 0.73 mg/dL (ref 0.57–1.00)
Glucose: 78 mg/dL (ref 70–99)
Potassium: 4.5 mmol/L (ref 3.5–5.2)
Sodium: 141 mmol/L (ref 134–144)
eGFR: 86 mL/min/{1.73_m2} (ref >59)

## 2023-07-06 LAB — MICROALBUMIN / CREATININE URINE RATIO
Creatinine, Urine: 150.9 mg/dL
Microalb/Creat Ratio: 7 mg/g{creat} (ref 0–29)
Microalbumin, Urine: 10.7 ug/mL

## 2023-07-06 NOTE — Assessment & Plan Note (Signed)
Patient denied flu shot

## 2023-07-06 NOTE — Assessment & Plan Note (Signed)
Patient has pain contract signed with [place] back in 2020 for her tramadol.  She is currently using it for chronic low back pain.  On my exam she does have what seems like radicular pain starting in the lower back and radiating to her hips bilaterally.  Will refill her tramadol, and send referral for physical therapy.  Plan: - Refill tramadol - Referral for physical therapy

## 2023-07-06 NOTE — Assessment & Plan Note (Addendum)
Current regimen is lisinopril 40 mg and Lopressor 50 mg.  Her systolic blood pressure is elevated in the clinic today, however her diastolic is low at 50.  Will not make any changes to this regimen at this time, encourage patient to check her blood pressure at home daily.  Plan: - Continue lisinopril 40 mg and Lopressor 50 mg - Will also check BMP today to assess kidney function.

## 2023-07-06 NOTE — Progress Notes (Signed)
CC: A1c check  HPI:  [name] is a 77 y.o. female living with a history stated below and presents today for A1c check. Please see problem based assessment and plan for additional details.  Past Medical History:  Diagnosis Date   Atypical nevus    Breast cancer (HCC) 07/14/2006   Left breast. Ductal carcinoma, grade 3. Followed by Dr. Donnie Coffin. s/p lumpectomy with radiation   Dental caries    Diabetes mellitus    DVT (deep vein thrombosis) in pregnancy 1982   Affected the RLE. Required coumadin.   Dysphagia 11/04/2021   Fibroids    GERD (gastroesophageal reflux disease)    Goiter    U/S 08/2009-persistent multiple small nodules, one in the right lower pole slightly enlarged, recommend continued followup.   History of mitral valve prolapse    Hyperlipidemia    Hypertension    Ovarian cyst    Personal history of radiation therapy    Schizophrenia (HCC)     Current Outpatient Medications on File Prior to Visit  Medication Sig Dispense Refill   aspirin 81 MG tablet Take 1 tablet (81 mg total) by mouth daily. 30 tablet 11   empagliflozin (JARDIANCE) 25 MG TABS tablet Take 1 tablet (25 mg total) by mouth daily. 60 tablet 3   meclizine (ANTIVERT) 12.5 MG tablet TAKE 1 TABLET BY MOUTH TWICE DAILY AS NEEDED FOR DIZZINESS 60 tablet 0   metFORMIN (GLUCOPHAGE) 1000 MG tablet TAKE 1 TABLET BY MOUTH TWICE DAILY WITH MEALS 180 tablet 0   metoprolol tartrate (LOPRESSOR) 50 MG tablet TAKE 3 TABLETS BY MOUTH TWICE DAILY 540 tablet 0   OLANZapine (ZYPREXA) 10 MG tablet Take 30 mg by mouth at bedtime.      pantoprazole (PROTONIX) 40 MG tablet Take 1 tablet by mouth once daily 90 tablet 0   pravastatin (PRAVACHOL) 40 MG tablet Take 1 tablet by mouth once daily 90 tablet 0   QUEtiapine (SEROQUEL) 50 MG tablet Take 150 mg by mouth at bedtime.     No current facility-administered medications on file prior to visit.    Family History  Problem Relation Age of Onset   Diabetes Mother     Diabetes Father     Social History   Socioeconomic History   Marital status: Widowed    Spouse name: Not on file   Number of children: Not on file   Years of education: Not on file   Highest education level: Not on file  Occupational History   Not on file  Tobacco Use   Smoking status: Former   Smokeless tobacco: Never   Tobacco comments:    quit 9 yrs ago.  Substance and Sexual Activity   Alcohol use: No    Alcohol/week: 0.0 standard drinks of alcohol   Drug use: No   Sexual activity: Not on file  Other Topics Concern   Not on file  Social History Narrative   Widow.   Used to work as a Lawyer, now retired.         Social Determinants of Health   Financial Resource Strain: Low Risk  (10/28/2022)   Overall Financial Resource Strain (CARDIA)    Difficulty of Paying Living Expenses: Not hard at all  Food Insecurity: No Food Insecurity (10/28/2022)   Hunger Vital Sign    Worried About Running Out of Food in the Last Year: Never true    Ran Out of Food in the Last Year: Never true  Transportation Needs: No Transportation  Needs (10/28/2022)   PRAPARE - Administrator, Civil Service (Medical): No    Lack of Transportation (Non-Medical): No  Physical Activity: Inactive (10/28/2022)   Exercise Vital Sign    Days of Exercise per Week: 0 days    Minutes of Exercise per Session: 0 min  Stress: No Stress Concern Present (10/28/2022)   [employer] of Occupational Health - Occupational Stress Questionnaire    Feeling of Stress : Not at all  Social Connections: Socially Isolated (10/28/2022)   Social Connection and Isolation Panel [NHANES]    Frequency of Communication with Friends and Family: Once a week    Frequency of Social Gatherings with Friends and Family: Once a week    Attends Religious Services: Never    Database administrator or Organizations: No    Attends Banker Meetings: Never    Marital Status: Widowed  Intimate Partner Violence: Not At  Risk (10/28/2022)   Humiliation, Afraid, Rape, and Kick questionnaire    Fear of Current or Ex-Partner: No    Emotionally Abused: No    Physically Abused: No    Sexually Abused: No    Review of Systems: ROS negative except for what is noted on the assessment and plan.  Vitals:   07/05/23 1000 07/05/23 1006  BP: (!) 140/48 (!) 135/50  Pulse: 73 68  Temp: 98.2 F (36.8 C)   TempSrc: Oral   SpO2: 98%   Weight: 208 lb (94.3 kg)   Height: 5\' 10"  (1.778 m)     Physical Exam: Constitutional: well-appearing female in no acute distress Cardiovascular: regular rate and rhythm, no m/r/g Pulmonary/Chest: normal work of breathing on room air, lungs clear to auscultation bilaterally Abdominal: soft, non-tender, non-distended MSK: normal bulk and tone, tenderness to palpation in the lumbar region radiating to her hips bilaterally, range of motion limited by pain Neurological: alert & oriented x 3, normal gait    Assessment & Plan:   Type 2 diabetes mellitus (HCC) Patient presents for follow-up regarding her diabetes.  Her last A1c in February 2024 was 6.8, today it is also 6.8.  Her current regimen is 15 units of Tresiba at night, metformin at 1000 mg, Jardiance 25 mg.  She does not check her sugars at home, she does not have a continuous glucose monitor.  She denies any symptoms of low blood sugar such as dizziness, sweating.  Capillary glucose today's at 74, however patient does say she has not eaten anything this morning and has taken her antiglycemic medications.  Overall, this 77 year old female, the goal is to eventually wean her off the insulin with a well-controlled A1c.  Difficult to do is currently we do not have any data on her blood sugar.  I am hoping with a CGM, and her next appointment we can review the data and slowly wean the Tresiba off.  Plan: - Decrease Tresiba to 12 units at night - Continue metformin at 1000 mg and Jardiance 25 mg - Micro albumin urine analysis sent  today - CGM with receiver (as patient does not have a phone) sent in, and referral to diabetes specialist.  Healthcare maintenance Patient denied flu shot.  Pancreatic lesion 2015 a 0.9 cm cystic pancreatic lesion was found on imaging, she did have 1 year follow-up with MRI abdomen, however per guidelines she requires a 2-year follow-up as well which she was not able to get.  Will place order for repeat MRI, and after this MRI, assuming pancreatic lesions are  stable, she will not need any more.  Reassuring that she is not having any symptoms such as right lower quadrant pain radiating to the back, or jaundice.  Plan: - MRI abdomen  Essential hypertension Current regimen is lisinopril 40 mg and Lopressor 50 mg.  Her systolic blood pressure is elevated in the clinic today, however her diastolic is low at 50.  Will not make any changes to this regimen at this time, encourage patient to check her blood pressure at home daily.  Plan: - Continue lisinopril 40 mg and Lopressor 50 mg - Will also check BMP today to assess kidney function.  Long term prescription opiate use Patient has pain contract signed with [place] back in 2020 for her tramadol.  She is currently using it for chronic low back pain.  On my exam she does have what seems like radicular pain starting in the lower back and radiating to her hips bilaterally.  Will refill her tramadol, and send referral for physical therapy.  Plan: - Refill tramadol - Referral for physical therapy  Back pain Patient with chronic back pain in her lower back radiating to her hips bilaterally.  She denies any red flag symptoms such as fevers, trouble urinating or having bowel movements.  Pain is reproducible on palpation.  She has been using Voltaren gel and tramadol to help with her symptoms, and she has had no issues with that.  Will also place a referral to physical therapy.  Patient discussed with Dr. Dierdre Forth Duilio Heritage, M.D. [facility] Internal  Medicine, PGY-2 Pager: [phone] Date 07/06/2023 Time 7:57 AM

## 2023-07-06 NOTE — Assessment & Plan Note (Signed)
Patient with chronic back pain in her lower back radiating to her hips bilaterally.  She denies any red flag symptoms such as fevers, trouble urinating or having bowel movements.  Pain is reproducible on palpation.  She has been using Voltaren gel and tramadol to help with her symptoms, and she has had no issues with that.  Will also place a referral to physical therapy.

## 2023-07-06 NOTE — Progress Notes (Signed)
Internal Medicine Clinic Attending  Case discussed with the resident at the time of the visit.  We reviewed the resident's history and exam and pertinent patient test results.  I agree with the assessment, diagnosis, and plan of care documented in the resident's note.  

## 2023-07-06 NOTE — Telephone Encounter (Signed)
Pa for pt ( FREESTYLE LIBRE 3 SENSOR )  came through on cover my meds was submitted with last office notes and labs .Marland Kitchen Awaiting approval or denial

## 2023-07-06 NOTE — Assessment & Plan Note (Signed)
2015 a 0.9 cm cystic pancreatic lesion was found on imaging, she did have 1 year follow-up with MRI abdomen, however per guidelines she requires a 2-year follow-up as well which she was not able to get.  Will place order for repeat MRI, and after this MRI, assuming pancreatic lesions are stable, she will not need any more.  Reassuring that she is not having any symptoms such as right lower quadrant pain radiating to the back, or jaundice.  Plan: - MRI abdomen

## 2023-07-06 NOTE — Assessment & Plan Note (Signed)
Patient presents for follow-up regarding her diabetes.  Her last A1c in February 2024 was 6.8, today it is also 6.8.  Her current regimen is 15 units of Tresiba at night, metformin at 1000 mg, Jardiance 25 mg.  She does not check her sugars at home, she does not have a continuous glucose monitor.  She denies any symptoms of low blood sugar such as dizziness, sweating.  Capillary glucose today's at 74, however patient does say she has not eaten anything this morning and has taken her antiglycemic medications.  Overall, this 77 year old female, the goal is to eventually wean her off the insulin with a well-controlled A1c.  Difficult to do is currently we do not have any data on her blood sugar.  I am hoping with a CGM, and her next appointment we can review the data and slowly wean the Tresiba off.  Plan: - Decrease Tresiba to 12 units at night - Continue metformin at 1000 mg and Jardiance 25 mg - Micro albumin urine analysis sent today - CGM with receiver (as patient does not have a phone) sent in, and referral to diabetes specialist.

## 2023-07-08 NOTE — Telephone Encounter (Signed)
Decision:Approved Geni Bers (Key: [id number]) PA Case ID #: [id number] Rx #: [id number] Need Help? Call us at [phone] Outcome Approved on October 29 by [facility] Medicare 2017 Approved. This drug has been approved under the Member's Medicare Part B benefit. Approved quantity: 2 sensors per 28 day(s). Please call the pharmacy to process the prescription claim. Authorization Expiration Date: 09/07/2023 Drug FreeStyle Libre 3 Sensor ePA cloud logo Form [facility] Medicare Electronic Prior Authorization Request Form [phone] NCPDP) Original Claim Info 48

## 2023-08-02 ENCOUNTER — Ambulatory Visit: Payer: Medicare (Managed Care) | Attending: Internal Medicine | Admitting: Physical Therapy

## 2023-08-23 ENCOUNTER — Other Ambulatory Visit: Payer: Self-pay | Admitting: Student

## 2023-08-23 DIAGNOSIS — I10 Essential (primary) hypertension: Secondary | ICD-10-CM

## 2023-08-24 ENCOUNTER — Other Ambulatory Visit: Payer: Self-pay | Admitting: Student

## 2023-08-24 DIAGNOSIS — E1165 Type 2 diabetes mellitus with hyperglycemia: Secondary | ICD-10-CM

## 2023-08-29 ENCOUNTER — Other Ambulatory Visit: Payer: Self-pay | Admitting: Student

## 2023-08-29 DIAGNOSIS — Z79891 Long term (current) use of opiate analgesic: Secondary | ICD-10-CM

## 2023-09-03 ENCOUNTER — Telehealth: Payer: Self-pay

## 2023-09-03 NOTE — Telephone Encounter (Signed)
Prior Authorization for patient (FreeStyle Libre 3 Sensor) came through on cover my meds was submitted with last office notes and labs awaiting approval or denial.  NUU:[id number]

## 2023-09-06 NOTE — Telephone Encounter (Signed)
Decision:Approved  Geni Bers (Key: Burna) PA Case ID #: [id number] Need Help? Call us at [phone] Outcome Approved on December 27 by [facility] Medicare 2017 Approved. This drug has been approved under the Member's Medicare Part B benefit. Approved quantity: 2 sensors per 28 day(s). Please call the pharmacy to process the prescription claim. Authorization Expiration Date: 09/06/2024 Drug FreeStyle Libre 3 Sensor ePA cloud logo Form [facility] Medicare Electronic Prior Authorization Request Form [phone] NCPDP)

## 2023-10-07 ENCOUNTER — Other Ambulatory Visit: Payer: Self-pay | Admitting: Student

## 2023-10-07 DIAGNOSIS — K219 Gastro-esophageal reflux disease without esophagitis: Secondary | ICD-10-CM

## 2023-10-07 NOTE — Telephone Encounter (Signed)
Medication sent to pharmacy

## 2023-10-09 DEATH — deceased

## 2023-10-18 ENCOUNTER — Other Ambulatory Visit: Payer: Self-pay

## 2023-10-18 DIAGNOSIS — R42 Dizziness and giddiness: Secondary | ICD-10-CM

## 2023-10-19 MED ORDER — MECLIZINE HCL 12.5 MG PO TABS: ORAL_TABLET | ORAL | 0 refills | Status: DC

## 2023-10-22 ENCOUNTER — Other Ambulatory Visit: Payer: Self-pay | Admitting: Student

## 2023-10-22 DIAGNOSIS — Z79891 Long term (current) use of opiate analgesic: Secondary | ICD-10-CM

## 2023-10-25 ENCOUNTER — Other Ambulatory Visit: Payer: Self-pay | Admitting: Student

## 2023-10-25 DIAGNOSIS — E1165 Type 2 diabetes mellitus with hyperglycemia: Secondary | ICD-10-CM

## 2023-10-25 NOTE — Telephone Encounter (Signed)
 Medication sent to pharmacy

## 2023-11-01 ENCOUNTER — Other Ambulatory Visit: Payer: Self-pay | Admitting: Student

## 2023-11-01 DIAGNOSIS — Z79891 Long term (current) use of opiate analgesic: Secondary | ICD-10-CM

## 2023-12-14 ENCOUNTER — Other Ambulatory Visit: Payer: Self-pay | Admitting: Student

## 2023-12-14 ENCOUNTER — Encounter: Payer: Self-pay | Admitting: Student

## 2023-12-14 DIAGNOSIS — I10 Essential (primary) hypertension: Secondary | ICD-10-CM

## 2023-12-17 ENCOUNTER — Telehealth: Payer: Self-pay

## 2023-12-17 NOTE — Telephone Encounter (Signed)
 Copied from CRM [id number]. Topic: Clinical - Medication Question >> Dec 17, 2023 11:51 AM Thomes Dinning wrote: Reason for CRM: Erie Noe from [facility] # [phone] called to advised provider the following medication were not good for the patient due to her age. They are asking if the medication can be discontinued for the patient use or provide her with an alternative medication.  meclizine (ANTIVERT) 12.5 MG tablet OLANZapine (ZYPREXA) 10 MG tablet

## 2023-12-27 ENCOUNTER — Other Ambulatory Visit: Payer: Self-pay | Admitting: Student

## 2023-12-27 DIAGNOSIS — E1165 Type 2 diabetes mellitus with hyperglycemia: Secondary | ICD-10-CM

## 2023-12-28 ENCOUNTER — Other Ambulatory Visit: Payer: Self-pay | Admitting: Student

## 2023-12-28 DIAGNOSIS — Z79891 Long term (current) use of opiate analgesic: Secondary | ICD-10-CM

## 2023-12-28 MED ORDER — EMPAGLIFLOZIN 25 MG PO TABS: 25.0000 mg | ORAL_TABLET | Freq: Every day | ORAL | 3 refills | Status: DC

## 2023-12-28 NOTE — Telephone Encounter (Signed)
 Patient last seen 07/05/23, I called and spoke to the patients daughter she stated she would call back to schedule a follow up appointment for the patient.   Medication sent to pharmacy.

## 2024-01-18 ENCOUNTER — Ambulatory Visit: Admitting: Student

## 2024-02-07 ENCOUNTER — Other Ambulatory Visit: Payer: Self-pay | Admitting: Student

## 2024-02-07 DIAGNOSIS — K219 Gastro-esophageal reflux disease without esophagitis: Secondary | ICD-10-CM

## 2024-02-07 NOTE — Telephone Encounter (Signed)
 Medication sent to pharmacy

## 2024-02-09 ENCOUNTER — Ambulatory Visit: Payer: Self-pay | Admitting: Internal Medicine

## 2024-02-25 ENCOUNTER — Other Ambulatory Visit: Payer: Self-pay | Admitting: Student

## 2024-02-25 DIAGNOSIS — Z79891 Long term (current) use of opiate analgesic: Secondary | ICD-10-CM

## 2024-02-26 ENCOUNTER — Other Ambulatory Visit: Payer: Self-pay | Admitting: Student

## 2024-02-26 DIAGNOSIS — E1165 Type 2 diabetes mellitus with hyperglycemia: Secondary | ICD-10-CM

## 2024-02-28 NOTE — Telephone Encounter (Signed)
 Medication sent to pharmacy

## 2024-03-01 ENCOUNTER — Other Ambulatory Visit: Payer: Self-pay | Admitting: Student

## 2024-03-01 DIAGNOSIS — R42 Dizziness and giddiness: Secondary | ICD-10-CM

## 2024-03-06 ENCOUNTER — Ambulatory Visit: Payer: Medicare (Managed Care) | Admitting: Student

## 2024-03-06 VITALS — BP 177/90 | HR 76 | Temp 98.1°F | Ht 70.0 in | Wt 196.6 lb

## 2024-03-06 DIAGNOSIS — E1165 Type 2 diabetes mellitus with hyperglycemia: Secondary | ICD-10-CM

## 2024-03-06 DIAGNOSIS — E1169 Type 2 diabetes mellitus with other specified complication: Secondary | ICD-10-CM | POA: Diagnosis not present

## 2024-03-06 DIAGNOSIS — R42 Dizziness and giddiness: Secondary | ICD-10-CM | POA: Diagnosis not present

## 2024-03-06 DIAGNOSIS — Z794 Long term (current) use of insulin: Secondary | ICD-10-CM

## 2024-03-06 DIAGNOSIS — F209 Schizophrenia, unspecified: Secondary | ICD-10-CM

## 2024-03-06 DIAGNOSIS — Z7984 Long term (current) use of oral hypoglycemic drugs: Secondary | ICD-10-CM

## 2024-03-06 DIAGNOSIS — M545 Low back pain, unspecified: Secondary | ICD-10-CM

## 2024-03-06 DIAGNOSIS — E785 Hyperlipidemia, unspecified: Secondary | ICD-10-CM

## 2024-03-06 DIAGNOSIS — Z79891 Long term (current) use of opiate analgesic: Secondary | ICD-10-CM

## 2024-03-06 DIAGNOSIS — K869 Disease of pancreas, unspecified: Secondary | ICD-10-CM | POA: Diagnosis not present

## 2024-03-06 DIAGNOSIS — K219 Gastro-esophageal reflux disease without esophagitis: Secondary | ICD-10-CM

## 2024-03-06 DIAGNOSIS — L600 Ingrowing nail: Secondary | ICD-10-CM

## 2024-03-06 DIAGNOSIS — I10 Essential (primary) hypertension: Secondary | ICD-10-CM | POA: Diagnosis not present

## 2024-03-06 DIAGNOSIS — E119 Type 2 diabetes mellitus without complications: Secondary | ICD-10-CM

## 2024-03-06 DIAGNOSIS — M549 Dorsalgia, unspecified: Secondary | ICD-10-CM

## 2024-03-06 LAB — POCT GLYCOSYLATED HEMOGLOBIN (HGB A1C): Hemoglobin A1C: 5.8 % — AB (ref 4.0–5.6)

## 2024-03-06 LAB — GLUCOSE, CAPILLARY: Glucose-Capillary: 93 mg/dL (ref 70–99)

## 2024-03-06 MED ORDER — AMLODIPINE BESYLATE 10 MG PO TABS: 10.0000 mg | ORAL_TABLET | Freq: Every day | ORAL | 11 refills | Status: AC

## 2024-03-06 MED ORDER — TRAMADOL HCL 50 MG PO TABS: 50.0000 mg | ORAL_TABLET | Freq: Two times a day (BID) | ORAL | 2 refills | Status: DC | PRN

## 2024-03-06 MED ORDER — METFORMIN HCL 1000 MG PO TABS: 1000.0000 mg | ORAL_TABLET | Freq: Two times a day (BID) | ORAL | 0 refills | Status: DC

## 2024-03-06 MED ORDER — EMPAGLIFLOZIN 25 MG PO TABS: 25.0000 mg | ORAL_TABLET | Freq: Every day | ORAL | 3 refills | Status: AC

## 2024-03-06 MED ORDER — METOPROLOL TARTRATE 50 MG PO TABS: 150.0000 mg | ORAL_TABLET | Freq: Two times a day (BID) | ORAL | 0 refills | Status: DC

## 2024-03-06 MED ORDER — PRAVASTATIN SODIUM 40 MG PO TABS
40.0000 mg | ORAL_TABLET | Freq: Every day | ORAL | 0 refills | Status: DC
Start: 2024-03-06 — End: 2024-04-26

## 2024-03-06 MED ORDER — PANTOPRAZOLE SODIUM 40 MG PO TBEC
40.0000 mg | DELAYED_RELEASE_TABLET | Freq: Every day | ORAL | 0 refills | Status: DC
Start: 2024-03-06 — End: 2024-04-26

## 2024-03-06 MED ORDER — MECLIZINE HCL 12.5 MG PO TABS: ORAL_TABLET | ORAL | 0 refills | Status: DC

## 2024-03-06 MED ORDER — LISINOPRIL 40 MG PO TABS
40.0000 mg | ORAL_TABLET | Freq: Every day | ORAL | 3 refills | Status: AC
Start: 2024-03-06 — End: ?

## 2024-03-06 MED ORDER — TRESIBA FLEXTOUCH 100 UNIT/ML ~~LOC~~ SOPN: 10.0000 [IU] | PEN_INJECTOR | Freq: Every day | SUBCUTANEOUS | 3 refills | Status: DC

## 2024-03-06 NOTE — Progress Notes (Unsigned)
 CC: Routine Health Visit  HPI:  [name] is a 78 y.o. female living with a history stated below and presents today for general health maintenance. Please see problem based assessment and plan for additional details.  Past Medical History:  Diagnosis Date   Atypical nevus    Breast cancer (HCC) 07/14/2006   Left breast. Ductal carcinoma, grade 3. Followed by Dr. Melodye. s/p lumpectomy with radiation   Dental caries    Diabetes mellitus    DVT (deep vein thrombosis) in pregnancy 1982   Affected the RLE. Required coumadin.   Dysphagia 11/04/2021   Fibroids    GERD (gastroesophageal reflux disease)    Goiter    U/S 08/2009-persistent multiple small nodules, one in the right lower pole slightly enlarged, recommend continued followup.   History of mitral valve prolapse    Hyperlipidemia    Hypertension    Ovarian cyst    Personal history of radiation therapy    Schizophrenia (HCC)     Current Outpatient Medications on File Prior to Visit  Medication Sig Dispense Refill   pantoprazole  (PROTONIX ) 40 MG tablet Take 1 tablet by mouth once daily 90 tablet 0   pravastatin  (PRAVACHOL ) 40 MG tablet Take 1 tablet by mouth once daily 90 tablet 0   aspirin  81 MG tablet Take 1 tablet (81 mg total) by mouth daily. 30 tablet 11   Continuous Glucose Receiver (FREESTYLE LIBRE 3 READER) DEVI Please use to check glucose daily. 1 each 5   Continuous Glucose Sensor (FREESTYLE LIBRE 3 SENSOR) MISC Place 1 sensor on the skin every 14 days. Use to check glucose continuously 2 each 5   diclofenac  Sodium (VOLTAREN ) 1 % GEL Apply 2 g topically 4 (four) times daily. 50 g 1   empagliflozin  (JARDIANCE ) 25 MG TABS tablet Take 1 tablet (25 mg total) by mouth daily. 60 tablet 3   insulin  degludec (TRESIBA  FLEXTOUCH) 100 UNIT/ML FlexTouch Pen Inject 12 Units into the skin daily. 3 mL 3   lisinopril  (ZESTRIL ) 40 MG tablet Take 1 tablet (40 mg total) by mouth daily. 90 tablet 3   meclizine  (ANTIVERT ) 12.5  MG tablet TAKE 1 TABLET BY MOUTH TWICE DAILY AS NEEDED FOR DIZZINESS 60 tablet 0   metFORMIN  (GLUCOPHAGE ) 1000 MG tablet TAKE 1 TABLET BY MOUTH TWICE DAILY WITH MEALS 120 tablet 0   metoprolol  tartrate (LOPRESSOR ) 50 MG tablet TAKE 3 TABLETS BY MOUTH TWICE DAILY 540 tablet 0   OLANZapine  (ZYPREXA ) 10 MG tablet Take 30 mg by mouth at bedtime.      QUEtiapine  (SEROQUEL ) 50 MG tablet Take 150 mg by mouth at bedtime.     traMADol  (ULTRAM ) 50 MG tablet TAKE 1 TABLET BY MOUTH EVERY 12 HOURS AS NEEDED 60 tablet 0   No current facility-administered medications on file prior to visit.    Family History  Problem Relation Age of Onset   Diabetes Mother    Diabetes Father     Social History   Socioeconomic History   Marital status: Widowed    Spouse name: Not on file   Number of children: Not on file   Years of education: Not on file   Highest education level: Not on file  Occupational History   Not on file  Tobacco Use   Smoking status: Former   Smokeless tobacco: Never   Tobacco comments:    quit 9 yrs ago.  Substance and Sexual Activity   Alcohol use: No    Alcohol/week: 0.0 standard  drinks of alcohol   Drug use: No   Sexual activity: Not on file  Other Topics Concern   Not on file  Social History Narrative   Widow.   Used to work as a Lawyer, now retired.         Social Drivers of Corporate investment banker Strain: Low Risk  (10/28/2022)   Overall Financial Resource Strain (CARDIA)    Difficulty of Paying Living Expenses: Not hard at all  Food Insecurity: No Food Insecurity (10/28/2022)   Hunger Vital Sign    Worried About Running Out of Food in the Last Year: Never true    Ran Out of Food in the Last Year: Never true  Transportation Needs: No Transportation Needs (10/28/2022)   PRAPARE - Administrator, Civil Service (Medical): No    Lack of Transportation (Non-Medical): No  Physical Activity: Inactive (10/28/2022)   Exercise Vital Sign    Days of Exercise per  Week: 0 days    Minutes of Exercise per Session: 0 min  Stress: No Stress Concern Present (10/28/2022)   [employer] of Occupational Health - Occupational Stress Questionnaire    Feeling of Stress : Not at all  Social Connections: Socially Isolated (10/28/2022)   Social Connection and Isolation Panel    Frequency of Communication with Friends and Family: Once a week    Frequency of Social Gatherings with Friends and Family: Once a week    Attends Religious Services: Never    Database administrator or Organizations: No    Attends Banker Meetings: Never    Marital Status: Widowed  Intimate Partner Violence: Not At Risk (10/28/2022)   Humiliation, Afraid, Rape, and Kick questionnaire    Fear of Current or Ex-Partner: No    Emotionally Abused: No    Physically Abused: No    Sexually Abused: No    Review of Systems: ROS negative except for what is noted on the assessment and plan.  Vitals:   03/06/24 1600 03/06/24 1610  BP: (!) 155/76 (!) 157/85  Pulse: 80 80  Temp: 98.1 F (36.7 C)   TempSrc: Oral   SpO2: 97%   Weight: 196 lb 9.6 oz (89.2 kg)   Height: 5' 10 (1.778 m)     Physical Exam: Constitutional: well-appearing ***  in no acute distress HENT: normocephalic atraumatic, mucous membranes moist Eyes: conjunctiva non-erythematous Neck: supple Cardiovascular: regular rate and rhythm, no m/r/g Pulmonary/Chest: normal work of breathing on room air, lungs clear to auscultation bilaterally Abdominal: soft, non-tender, non-distended MSK: normal bulk and tone Neurological: alert & oriented x 3, 5/5 strength in bilateral upper and lower extremities, normal gait Skin: warm and dry Psych: ***  Assessment & Plan:   No problem-specific Assessment & Plan notes found for this encounter.   Patient {GC/[id number]::discussed with,seen with} Dr. {[id number]::Tpoopjfd,Z. Hoffman,Winfrey,Narendra,Chun,Guilloud,Lau,Machen}  [name],  M.D. [facility] Internal Medicine, PGY-3 Pager: [phone] Date 03/06/2024 Time 4:38 PM

## 2024-03-06 NOTE — Patient Instructions (Addendum)
 Thank you so much for coming to the clinic today!   I have refilled all your medications, I'm happy your A1c has improved! I think we can slowly start coming down. We will continue your medications as well, with the addition of amlodipine for your blood pressure. I would like to see you back in a month for blood pressure management.   If you have any questions please feel free to the call the clinic at anytime at [phone]. It was a pleasure seeing you!  Best, Dr. Kharis Lapenna

## 2024-03-07 LAB — LIPID PANEL
Chol/HDL Ratio: 3.5 ratio (ref 0.0–4.4)
Cholesterol, Total: 110 mg/dL (ref 100–199)
HDL: 31 mg/dL — ABNORMAL LOW (ref >39)
LDL Chol Calc (NIH): 43 mg/dL (ref 0–99)
Triglycerides: 224 mg/dL — ABNORMAL HIGH (ref 0–149)
VLDL Cholesterol Cal: 36 mg/dL (ref 5–40)

## 2024-03-07 NOTE — Assessment & Plan Note (Signed)
 Has been taking Pravastatin , last Lipid panel checked was 9 years ago. Will recheck today.   Plan:  - Pravastatin  40mg   - Recheck Lipid Panel

## 2024-03-07 NOTE — Assessment & Plan Note (Signed)
 A1c has significantly improved from 6.8 to 5.8 today. Her current regimen is Tresiba  12 units, metformin  1000mg  BID, and Jardiance  25mg . With improvement in her A1c, will decrease tresiba  to 10 units. She does check her sugar at home and her fasting glucose is around 120 (reportedly, unfortunately patient did not bring in her device).   Plan:  - Decrease Tresibe to 10U  - Continue Metformin  1000mg  BID  - Continue Jardiance 

## 2024-03-07 NOTE — Assessment & Plan Note (Signed)
 Meclizine  seems to be controlling her symptoms very well. I did discuss the complications of this medication especially in elder population, however she is comfortable with it and has not had any symptoms while being on it.

## 2024-03-07 NOTE — Assessment & Plan Note (Signed)
 MRI abdomen has been ordered from last visit, instructed patient and daughter to call radiology.

## 2024-03-07 NOTE — Assessment & Plan Note (Signed)
 Follows with [facility] for this, currently prescribed zyprexa  and seroquel .

## 2024-03-07 NOTE — Assessment & Plan Note (Signed)
 Chronically using tramadol  for this pain, she has a pain contract on file with us . Has had no issues. Will continue.

## 2024-03-07 NOTE — Assessment & Plan Note (Signed)
 Blood pressure elevated today in clinci (157/85 --> 177/89). Her only regimen is lisniopril 40mg , and metoprolol  50mg  BID. She does not check her blood pressure at home. Given her consistent elevation in blood pressure over multiple clinic visits, will start amlodipine and follow up in a month for blood pressure evaluation.

## 2024-03-08 ENCOUNTER — Ambulatory Visit: Payer: Self-pay | Admitting: Student

## 2024-03-08 ENCOUNTER — Telehealth: Payer: Self-pay | Admitting: *Deleted

## 2024-03-08 NOTE — Telephone Encounter (Signed)
 Copied from CRM [id number]. Topic: Clinical - Medication Prior Auth >> Mar 08, 2024  4:11 PM Fredrica W wrote: Reason for CRM: Patient daughter called. States Patient still has not been able to get medication. Pharmacy says she needs Prior Auth. traMADol  (ULTRAM ) 50 MG tablet. Thank You

## 2024-03-09 NOTE — Telephone Encounter (Signed)
 Pa was completed and submitted with last office notes  to cover  my meds .SABRA Awaiting approval or denial    KEY : [id number]

## 2024-03-09 NOTE — Telephone Encounter (Signed)
 Pt's daughter called and informed PA is in process; stated she will informed her mother.

## 2024-03-09 NOTE — Telephone Encounter (Signed)
 Pt's daughter calling to f/u on PA for Tramadol .

## 2024-03-13 NOTE — Progress Notes (Signed)
 Internal Medicine Clinic Attending  Case discussed with the resident at the time of the visit.  We reviewed the resident's history and exam and pertinent patient test results.  I agree with the assessment, diagnosis, and plan of care documented in the resident's note.

## 2024-03-13 NOTE — Telephone Encounter (Signed)
 DECISION :  APPROVED    Approved on July 4 by [facility] Medicare 2017 Approved.     The requested medication is covered on the formulary with a Quantity Limit (QL) of 240 per 30 days. Since this request is within the QL, it does not require a Coverage Determination Request. Please call the pharmacy to process the prescription claim. The drug is not paying at the pharmacy because of the opioid safety rules. We do not show that you filled an opioid drug in the last 90 days. Therefore, your first fill of a formulary opioid drug is limited to a 7 day supply. Future fills of formulary opioid drugs will be allowed up to the standard day supply. Please call the pharmacy to change the day supply to up to 7 days. Effective Date: 02/28/2024  Authorization Expiration Date: 09/06/2024  Drug traMADol  HCl 50MG  tablets  ePA cloud logo Form [facility] Medicare Electronic Prior Authorization Request Form (2017 NCPDP)     COPY SENT TO PHARMACY AND COPY PLACE TO SCAN TO CHART

## 2024-03-22 ENCOUNTER — Ambulatory Visit: Payer: Medicare (Managed Care) | Admitting: Podiatry

## 2024-03-29 ENCOUNTER — Ambulatory Visit: Payer: Medicare (Managed Care) | Admitting: Podiatry

## 2024-04-10 ENCOUNTER — Other Ambulatory Visit: Payer: Self-pay | Admitting: Internal Medicine

## 2024-04-10 DIAGNOSIS — Z1231 Encounter for screening mammogram for malignant neoplasm of breast: Secondary | ICD-10-CM

## 2024-04-12 ENCOUNTER — Ambulatory Visit: Payer: Self-pay | Admitting: Student

## 2024-04-19 ENCOUNTER — Ambulatory Visit: Payer: Self-pay

## 2024-04-20 ENCOUNTER — Encounter: Payer: Self-pay | Admitting: Student

## 2024-04-24 ENCOUNTER — Ambulatory Visit: Payer: Self-pay | Admitting: Student

## 2024-04-26 ENCOUNTER — Ambulatory Visit (INDEPENDENT_AMBULATORY_CARE_PROVIDER_SITE_OTHER): Payer: Medicare (Managed Care) | Admitting: Student

## 2024-04-26 ENCOUNTER — Ambulatory Visit (HOSPITAL_COMMUNITY)
Admission: RE | Admit: 2024-04-26 | Discharge: 2024-04-26 | Disposition: A | Discharge status: Home / Self Care | Payer: Medicare (Managed Care) | Source: Ambulatory Visit | Attending: Internal Medicine | Admitting: Internal Medicine

## 2024-04-26 ENCOUNTER — Encounter: Payer: Self-pay | Admitting: Student

## 2024-04-26 VITALS — BP 144/60 | HR 63 | Temp 97.6°F | Ht 70.0 in | Wt 192.6 lb

## 2024-04-26 DIAGNOSIS — G8929 Other chronic pain: Secondary | ICD-10-CM | POA: Diagnosis not present

## 2024-04-26 DIAGNOSIS — K219 Gastro-esophageal reflux disease without esophagitis: Secondary | ICD-10-CM

## 2024-04-26 DIAGNOSIS — I10 Essential (primary) hypertension: Secondary | ICD-10-CM

## 2024-04-26 DIAGNOSIS — M545 Low back pain, unspecified: Secondary | ICD-10-CM

## 2024-04-26 DIAGNOSIS — E785 Hyperlipidemia, unspecified: Secondary | ICD-10-CM

## 2024-04-26 DIAGNOSIS — E1169 Type 2 diabetes mellitus with other specified complication: Secondary | ICD-10-CM | POA: Diagnosis not present

## 2024-04-26 DIAGNOSIS — E119 Type 2 diabetes mellitus without complications: Secondary | ICD-10-CM

## 2024-04-26 DIAGNOSIS — Z7984 Long term (current) use of oral hypoglycemic drugs: Secondary | ICD-10-CM

## 2024-04-26 DIAGNOSIS — Z794 Long term (current) use of insulin: Secondary | ICD-10-CM

## 2024-04-26 MED ORDER — METOPROLOL TARTRATE 50 MG PO TABS: 150.0000 mg | ORAL_TABLET | Freq: Two times a day (BID) | ORAL | 3 refills | Status: AC

## 2024-04-26 MED ORDER — PANTOPRAZOLE SODIUM 40 MG PO TBEC
40.0000 mg | DELAYED_RELEASE_TABLET | Freq: Every day | ORAL | 0 refills | Status: AC
Start: 2024-07-25 — End: ?

## 2024-04-26 MED ORDER — PRAVASTATIN SODIUM 40 MG PO TABS
40.0000 mg | ORAL_TABLET | Freq: Every day | ORAL | 3 refills | Status: AC
Start: 2024-07-25 — End: ?

## 2024-04-26 MED ORDER — METFORMIN HCL 1000 MG PO TABS: 1000.0000 mg | ORAL_TABLET | Freq: Two times a day (BID) | ORAL | 3 refills | Status: AC

## 2024-04-26 NOTE — Progress Notes (Unsigned)
 Patient name: [name] Date of birth: [birth year] Date of visit: 04/27/24  Subjective   Reason for visit: Back Pain and Medication Refill  Chronic lower back pain, radiating to the legs. On tramadol  for this. Sometimes pain wakes her up at nighttime. Usually it's worse when [name]'s moving about.  Review of Systems  Constitutional:  Positive for weight loss. Negative for chills, diaphoresis and fever.   Current Outpatient Medications  Medication Instructions   amLODipine  (NORVASC ) 10 mg, Oral, Daily   aspirin  81 mg, Oral, Daily   Continuous Glucose Receiver (FREESTYLE LIBRE 3 READER) DEVI Please use to check glucose daily.   Continuous Glucose Sensor (FREESTYLE LIBRE 3 SENSOR) MISC Place 1 sensor on the skin every 14 days. Use to check glucose continuously   diclofenac  Sodium (VOLTAREN ) 2 g, Topical, 4 times daily   empagliflozin  (JARDIANCE ) 25 mg, Oral, Daily   lisinopril  (ZESTRIL ) 40 mg, Oral, Daily   meclizine  (ANTIVERT ) 12.5 MG tablet TAKE 1 TABLET BY MOUTH TWICE DAILY AS NEEDED FOR DIZZINESS   metFORMIN  (GLUCOPHAGE ) 1,000 mg, Oral, 2 times daily with meals   metoprolol  tartrate (LOPRESSOR ) 150 mg, Oral, 2 times daily   OLANZapine  (ZYPREXA ) 30 mg, Daily at bedtime   pantoprazole  (PROTONIX ) 40 mg, Oral, Daily   pravastatin  (PRAVACHOL ) 40 mg, Oral, Daily   QUEtiapine  (SEROQUEL ) 150 mg, Daily at bedtime   traMADol  (ULTRAM ) 50 mg, Oral, Every 12 hours PRN   Tresiba  FlexTouch 10 Units, Subcutaneous, Daily     Objective  Today's Vitals   04/26/24 1458 04/26/24 1536  BP: (!) 153/61 (!) 144/60  Pulse: 65 63  Temp: 97.6 F (36.4 C)   TempSrc: Oral   SpO2: 100%   Weight: 192 lb 9.6 oz (87.4 kg)   Height: 5' 10 (1.778 m)   PainSc: 4    Body mass index is 27.64 kg/m.   Physical Exam Constitutional:      Appearance: Normal appearance.  Cardiovascular:     Rate and Rhythm: Normal rate and regular rhythm.  Pulmonary:     Effort: Pulmonary effort is normal. No  respiratory distress.  Musculoskeletal:     Comments: Bilateral lumbar paraspinal tenderness 5/5 strength hip flexion, knee flexion and knee extension  Skin:    General: Skin is warm and dry.  Neurological:     Mental Status: [name] is alert.     Cranial Nerves: No facial asymmetry.     Comments: 2+ patellar reflexes bilaterally  Psychiatric:        Mood and Affect: Affect normal.        Speech: Speech normal.        Behavior: Behavior normal.      Assessment & Plan   Chronic bilateral low back pain without sciatica Assessment & Plan: Chronic, stable. Some signs to warrant imaging including weight loss and pain at night. History of invasive breast cancer. I still think benign cause is most likely. Appropriate to continue tramadol  50 mg q 12 h prn for pain. Lumbar spine x-rays requested.  Orders: -     DG Lumbar Spine Complete; Future  Essential hypertension Assessment & Plan: BP Readings from Last 3 Encounters:  04/26/24 (!) 144/60  03/06/24 (!) 177/90  07/05/23 (!) 135/50   Chronic, slightly above goal. Some discordance between home BP readings and clinic. Bring BP cuff to next visit. Refilled metoprolol .  Orders: -     Metoprolol  Tartrate; Take 3 tablets (150 mg total) by mouth 2 (two) times daily.  Dispense:  540 tablet; Refill: 3  Gastroesophageal reflux disease without esophagitis Assessment & Plan: Chronic, stable, refilled PPI  Orders: -     Pantoprazole  Sodium; Take 1 tablet (40 mg total) by mouth daily.  Dispense: 90 tablet; Refill: 0  Type 2 diabetes mellitus without complication, with long-term current use of insulin  [facility]) Assessment & Plan: Lab Results  Component Value Date   HGBA1C 5.8 (A) 03/06/2024   HGBA1C 6.8 (A) 07/05/2023   HGBA1C 6.8 (A) 10/28/2022   Chronic, at goal. Refilled metformin .  Orders: -     metFORMIN  HCl; Take 1 tablet (1,000 mg total) by mouth 2 (two) times daily with a meal.  Dispense: 120 tablet; Refill: 3  Hyperlipidemia  associated with type 2 diabetes mellitus [facility]) Assessment & Plan: Lipid Panel     Component Value Date/Time   CHOL 110 03/06/2024 1627   TRIG 224 (H) 03/06/2024 1627   HDL 31 (L) 03/06/2024 1627   CHOLHDL 3.5 03/06/2024 1627   CHOLHDL 3.7 06/20/2014 0931   VLDL 28 06/20/2014 0931   LDLCALC 43 03/06/2024 1627   LABVLDL 36 03/06/2024 1627   Chronic, at goal. Refilled pravastatin .  Orders: -     Pravastatin  Sodium; Take 1 tablet (40 mg total) by mouth daily.  Dispense: 90 tablet; Refill: 3    Return in about 3 months (around 07/27/2024) for routine follow-up.  Ozell Kung MD 04/27/2024, 5:04 PM

## 2024-04-26 NOTE — Patient Instructions (Addendum)
 Bring your blood pressure cuff next time you come to the clinic.  Remember to bring all of the medications that you take (including over the counter medications and supplements) with you to every clinic visit.  This after visit summary is an important review of tests, referrals, and medication changes that were discussed during your visit. If you have questions or concerns, call [phone]. Outside of clinic business hours, call the main hospital at [phone] and ask the operator for the on-call internal medicine resident.   Ozell Kung MD 04/26/2024, 3:46 PM

## 2024-04-27 NOTE — Assessment & Plan Note (Addendum)
 BP Readings from Last 3 Encounters:  04/26/24 (!) 144/60  03/06/24 (!) 177/90  07/05/23 (!) 135/50   Chronic, slightly above goal. Some discordance between home BP readings and clinic. Bring BP cuff to next visit. Refilled metoprolol .

## 2024-04-27 NOTE — Assessment & Plan Note (Signed)
 Lab Results  Component Value Date   HGBA1C 5.8 (A) 03/06/2024   HGBA1C 6.8 (A) 07/05/2023   HGBA1C 6.8 (A) 10/28/2022   Chronic, at goal. Refilled metformin .

## 2024-04-27 NOTE — Assessment & Plan Note (Signed)
 Chronic, stable. Some signs to warrant imaging including weight loss and pain at night. History of invasive breast cancer. I still think benign cause is most likely. Appropriate to continue tramadol  50 mg q 12 h prn for pain. Lumbar spine x-rays requested.

## 2024-04-27 NOTE — Assessment & Plan Note (Signed)
 Chronic, stable, refilled PPI

## 2024-04-27 NOTE — Assessment & Plan Note (Signed)
 Lipid Panel     Component Value Date/Time   CHOL 110 03/06/2024 1627   TRIG 224 (H) 03/06/2024 1627   HDL 31 (L) 03/06/2024 1627   CHOLHDL 3.5 03/06/2024 1627   CHOLHDL 3.7 06/20/2014 0931   VLDL 28 06/20/2014 0931   LDLCALC 43 03/06/2024 1627   LABVLDL 36 03/06/2024 1627   Chronic, at goal. Refilled pravastatin .

## 2024-05-01 NOTE — Progress Notes (Signed)
 Internal Medicine Clinic Attending  Case discussed with the resident at the time of the visit.  We reviewed the resident's history and exam and pertinent patient test results.  I agree with the assessment, diagnosis, and plan of care documented in the resident's note.

## 2024-05-17 ENCOUNTER — Ambulatory Visit: Payer: Medicare (Managed Care)

## 2024-05-18 ENCOUNTER — Ambulatory Visit: Payer: Self-pay | Admitting: Student

## 2024-05-18 ENCOUNTER — Other Ambulatory Visit: Payer: Self-pay | Admitting: Student

## 2024-05-18 DIAGNOSIS — R1900 Intra-abdominal and pelvic swelling, mass and lump, unspecified site: Secondary | ICD-10-CM

## 2024-05-18 NOTE — Progress Notes (Signed)
 Lumbar spine x-ray ordered for back pain with nocturnal pain, occasional night sweats, remote history of breast cancer. X-ray showed no spinal lesions but large dystrophic calcification noted with appearance of calcified fibroid uterus. Patient noted to have surgically absent uterus on CT abdomen/pelvis from 2018. Query fecalith (she has chronic constipation) versus neoplasm. After conversation with radiology and patient's daughter I recommend proceeding with CT abdomen/pelvis w/ contrast for better characterization of the pelvic mass.  Orders Placed This Encounter  Procedures   CT ABDOMEN PELVIS W CONTRAST    Standing Status:   Future    Expiration Date:   05/18/2025    If indicated for the ordered procedure, I authorize the administration of contrast media per Radiology protocol:   Yes    Does the patient have a contrast media/X-ray dye allergy?:   No    Preferred imaging location?:   [facility]    If indicated for the ordered procedure, I authorize the administration of oral contrast media per Radiology protocol:   Yes   Ozell Kung MD 05/18/2024, 11:26 AM

## 2024-05-24 ENCOUNTER — Ambulatory Visit: Payer: Medicare (Managed Care)

## 2024-06-28 ENCOUNTER — Telehealth: Payer: Self-pay | Admitting: Student

## 2024-06-28 NOTE — Telephone Encounter (Signed)
 Name: [name], Lahmann MRN: [id number]  Date: 07/11/2024 Status: Sch  Time: 11:00 AM Length: 30  Visit Type: CT ABDOMEN PELVIS W CONTRAST [[id number]] Copay: $0.00  Provider: [facility]       Copied from CRM [id number]. Topic: Referral - Status >> Jun 23, 2024  3:53 PM Susanna ORN wrote: Reason for CRM: Patient called to get a status on an abdomen scan that she's suppose to be having. Patient states no one has called her to schedule her. Please give patient a call back. CB #: [phone].

## 2024-07-11 ENCOUNTER — Ambulatory Visit (HOSPITAL_COMMUNITY): Payer: Medicare (Managed Care)

## 2024-07-12 ENCOUNTER — Ambulatory Visit

## 2024-07-21 ENCOUNTER — Ambulatory Visit (HOSPITAL_COMMUNITY)
Admission: RE | Admit: 2024-07-21 | Discharge: 2024-07-21 | Disposition: A | Discharge status: Home / Self Care | Payer: Medicare (Managed Care) | Source: Ambulatory Visit | Attending: Internal Medicine | Admitting: Internal Medicine

## 2024-07-21 ENCOUNTER — Encounter (HOSPITAL_COMMUNITY): Payer: Self-pay

## 2024-07-21 DIAGNOSIS — E119 Type 2 diabetes mellitus without complications: Secondary | ICD-10-CM | POA: Diagnosis present

## 2024-07-21 DIAGNOSIS — R1900 Intra-abdominal and pelvic swelling, mass and lump, unspecified site: Secondary | ICD-10-CM | POA: Diagnosis present

## 2024-07-21 DIAGNOSIS — Z794 Long term (current) use of insulin: Secondary | ICD-10-CM | POA: Insufficient documentation

## 2024-07-21 LAB — POCT I-STAT CREATININE: Creatinine, Ser: 0.7 mg/dL (ref 0.44–1.00)

## 2024-07-21 MED ORDER — IOHEXOL 300 MG/ML  SOLN
100.0000 mL | Freq: Once | INTRAMUSCULAR | Status: AC | PRN
  Administered 2024-07-21: 100 mL via INTRAVENOUS

## 2024-07-21 MED ORDER — SODIUM CHLORIDE (PF) 0.9 % IJ SOLN
INTRAMUSCULAR | Status: AC
Start: 2024-07-21 — End: 2024-07-21
  Filled 2024-07-21: qty 50

## 2024-07-24 ENCOUNTER — Telehealth: Payer: Self-pay

## 2024-07-24 ENCOUNTER — Ambulatory Visit: Payer: Self-pay | Admitting: Student

## 2024-07-24 DIAGNOSIS — R1900 Intra-abdominal and pelvic swelling, mass and lump, unspecified site: Secondary | ICD-10-CM | POA: Insufficient documentation

## 2024-07-24 DIAGNOSIS — K862 Cyst of pancreas: Secondary | ICD-10-CM

## 2024-07-24 NOTE — Telephone Encounter (Signed)
 Received a call from [place] at [facility] radiology. Please read the note under impression. Note has been attached below.    IMPRESSION: 15.7 cm multilobulated soft tissue mass in central pelvis, which is new since 2018 exam which showed prior hysterectomy. Internal calcifications have a benign appearing pattern as seen with fibroids, however, broad ligament fibroids would not be expected to develop or enlarge in a postmenopausal female. Other neoplasms including leiomyosarcoma and ovarian neoplasms cannot be excluded. Recommend GYN oncology consultation, and consider surgical evaluation.   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 Recommend abdomen MRI without and with contrast for further characterization.

## 2024-07-25 NOTE — Telephone Encounter (Unsigned)
 Copied from CRM [id number]. Topic: Clinical - Lab/Test Results >> Jul 25, 2024 10:25 AM Alfonso ORN wrote: Reason for CRM: Dorothe Sharps (daughter ) returning  [place],Michael,MD call regarding patient test results  Per [facility] Dr. Norrine is in room with another patient and will return call in 15 minutes

## 2024-07-25 NOTE — Telephone Encounter (Addendum)
 Called and spoke to [facility]. Recommend seeing gynecology for the pelvic mass in patient who is status post hysterectomy.  Referral Orders         Ambulatory referral to Gynecology      Ozell Kung MD 07/25/2024, 5:19 PM

## 2024-08-01 ENCOUNTER — Other Ambulatory Visit: Payer: Self-pay | Admitting: Student

## 2024-08-01 DIAGNOSIS — E119 Type 2 diabetes mellitus without complications: Secondary | ICD-10-CM

## 2024-08-09 NOTE — Telephone Encounter (Signed)
 Spoke with the patient and relayed the following information below:   Spoke with the office-  Per [facility] please send to the alternate number.   Rerouting to the new Fax number:   [phone]   Copied from CRM #[id number]. Topic: Referral - Status >> Aug 09, 2024  2:05 PM Fredrica W wrote: Reason for CRM: Patient checking status of referral. Provider referral information. >> Aug 09, 2024  2:17 PM Alfonso ORN wrote: Patient calling on the status of the referral to see an OB/GYN , pt called the [phone] and was told they did not have the referral

## 2024-08-11 ENCOUNTER — Inpatient Hospital Stay: Admission: RE | Admit: 2024-08-11 | Discharge: 2024-08-11 | Attending: Internal Medicine | Admitting: Internal Medicine

## 2024-08-11 DIAGNOSIS — Z1231 Encounter for screening mammogram for malignant neoplasm of breast: Secondary | ICD-10-CM

## 2024-08-24 ENCOUNTER — Other Ambulatory Visit: Payer: Self-pay | Admitting: Student

## 2024-08-24 DIAGNOSIS — R42 Dizziness and giddiness: Secondary | ICD-10-CM

## 2024-08-26 ENCOUNTER — Other Ambulatory Visit: Payer: Self-pay | Admitting: Student

## 2024-08-26 DIAGNOSIS — Z794 Long term (current) use of insulin: Secondary | ICD-10-CM

## 2024-08-26 DIAGNOSIS — E119 Type 2 diabetes mellitus without complications: Secondary | ICD-10-CM

## 2024-08-28 NOTE — Telephone Encounter (Signed)
 Medication sent to pharmacy

## 2024-09-06 ENCOUNTER — Other Ambulatory Visit: Payer: Self-pay | Admitting: Student

## 2024-09-06 DIAGNOSIS — R42 Dizziness and giddiness: Secondary | ICD-10-CM

## 2024-09-06 DIAGNOSIS — Z79891 Long term (current) use of opiate analgesic: Secondary | ICD-10-CM

## 2024-09-08 NOTE — Telephone Encounter (Signed)
 Patient last seen 04/26/24 I called the patient to schedule a appointment. I was unable to reach the patient. I lvm for her to give us  a call back.

## 2024-09-14 NOTE — Telephone Encounter (Signed)
 Spoke with the pt's caregiver daughterETTER Sartorius) who has agreed to to use a [facility] facility as [facility] never called the patient back to be sch.  Referral has now been sent to the following office:  [facility] clinic in [place], [place] Address: [address], [place], [place] [address]  Phone: [phone]  Copied from CRM [id number]. Topic: Referral - Status >> Aug 09, 2024  2:05 PM Fredrica W wrote: Reason for CRM: Patient checking status of referral. Provider referral information. >> Sep 13, 2024  4:22 PM Graeme ORN wrote: Patient called about referral status for gynecology referral. States she was provider with the number but it is a non working number. She tried to google it but is not able to find the correct place. She needs a new referral sent correctly or to be provided with the correct contact information to call and schedule. Thank You  >> Aug 09, 2024  2:17 PM Alfonso ORN wrote: Patient calling on the status of the referral to see an OB/GYN , pt called the [phone] and was told they did not have the referral

## 2024-10-03 ENCOUNTER — Ambulatory Visit: Payer: Self-pay | Admitting: Obstetrics and Gynecology

## 2024-10-24 ENCOUNTER — Other Ambulatory Visit

## 2024-10-24 ENCOUNTER — Encounter: Admitting: Obstetrics and Gynecology
